# Patient Record
Sex: Male | Born: 1950
Health system: Southern US, Community
[De-identification: ages and names within clinical notes are randomized; demographics above are authoritative.]

## PROBLEM LIST (undated history)

## (undated) DIAGNOSIS — Z720 Tobacco use: Secondary | ICD-10-CM

## (undated) DIAGNOSIS — M799 Soft tissue disorder, unspecified: Secondary | ICD-10-CM

## (undated) DIAGNOSIS — R200 Anesthesia of skin: Secondary | ICD-10-CM

## (undated) DIAGNOSIS — E785 Hyperlipidemia, unspecified: Secondary | ICD-10-CM

## (undated) DIAGNOSIS — K219 Gastro-esophageal reflux disease without esophagitis: Secondary | ICD-10-CM

## (undated) DIAGNOSIS — N289 Disorder of kidney and ureter, unspecified: Secondary | ICD-10-CM

## (undated) DIAGNOSIS — N189 Chronic kidney disease, unspecified: Secondary | ICD-10-CM

## (undated) DIAGNOSIS — M79605 Pain in left leg: Secondary | ICD-10-CM

## (undated) DIAGNOSIS — I517 Cardiomegaly: Secondary | ICD-10-CM

## (undated) DIAGNOSIS — M5030 Other cervical disc degeneration, unspecified cervical region: Secondary | ICD-10-CM

## (undated) DIAGNOSIS — S39012A Strain of muscle, fascia and tendon of lower back, initial encounter: Secondary | ICD-10-CM

## (undated) DIAGNOSIS — I10 Essential (primary) hypertension: Secondary | ICD-10-CM

## (undated) HISTORY — PX: WISDOM TOOTH EXTRACTION: SHX21

## (undated) HISTORY — DX: Gastro-esophageal reflux disease without esophagitis: K21.9

## (undated) HISTORY — DX: Hyperlipidemia, unspecified: E78.5

## (undated) HISTORY — DX: Tobacco use: Z72.0

## (undated) HISTORY — PX: COLONOSCOPY: SHX174

## (undated) HISTORY — DX: Chronic kidney disease, unspecified: N18.9

## (undated) HISTORY — DX: Essential (primary) hypertension: I10

## (undated) HISTORY — DX: Cardiomegaly: I51.7

## (undated) HISTORY — DX: Pain in left leg: M79.605

## (undated) HISTORY — DX: Soft tissue disorder, unspecified: M79.9

## (undated) HISTORY — DX: Disorder of kidney and ureter, unspecified: N28.9

## (undated) HISTORY — DX: Anesthesia of skin: R20.0

## (undated) HISTORY — DX: Strain of muscle, fascia and tendon of lower back, initial encounter: S39.012A

---

## 2006-03-16 ENCOUNTER — Inpatient Hospital Stay (HOSPITAL_COMMUNITY): Admission: EM | Admit: 2006-03-16 | Discharge: 2006-03-19 | Payer: Self-pay | Admitting: Emergency Medicine

## 2006-03-16 ENCOUNTER — Ambulatory Visit: Payer: Self-pay | Admitting: Cardiology

## 2006-03-16 ENCOUNTER — Ambulatory Visit: Payer: Self-pay | Admitting: *Deleted

## 2006-03-18 ENCOUNTER — Encounter: Payer: Self-pay | Admitting: Internal Medicine

## 2006-03-21 DIAGNOSIS — N189 Chronic kidney disease, unspecified: Secondary | ICD-10-CM

## 2006-03-21 DIAGNOSIS — N183 Chronic kidney disease, stage 3 unspecified: Secondary | ICD-10-CM | POA: Insufficient documentation

## 2006-03-21 DIAGNOSIS — N184 Chronic kidney disease, stage 4 (severe): Secondary | ICD-10-CM | POA: Insufficient documentation

## 2006-03-21 HISTORY — DX: Chronic kidney disease, unspecified: N18.9

## 2006-03-22 ENCOUNTER — Encounter (INDEPENDENT_AMBULATORY_CARE_PROVIDER_SITE_OTHER): Payer: Self-pay | Admitting: Internal Medicine

## 2006-03-22 ENCOUNTER — Ambulatory Visit: Payer: Self-pay | Admitting: Hospitalist

## 2006-03-22 DIAGNOSIS — K219 Gastro-esophageal reflux disease without esophagitis: Secondary | ICD-10-CM

## 2006-03-22 DIAGNOSIS — Z720 Tobacco use: Secondary | ICD-10-CM

## 2006-03-22 DIAGNOSIS — I10 Essential (primary) hypertension: Secondary | ICD-10-CM | POA: Insufficient documentation

## 2006-03-22 DIAGNOSIS — E785 Hyperlipidemia, unspecified: Secondary | ICD-10-CM | POA: Insufficient documentation

## 2006-03-22 DIAGNOSIS — Z87891 Personal history of nicotine dependence: Secondary | ICD-10-CM | POA: Insufficient documentation

## 2006-03-22 DIAGNOSIS — I517 Cardiomegaly: Secondary | ICD-10-CM

## 2006-03-22 HISTORY — DX: Gastro-esophageal reflux disease without esophagitis: K21.9

## 2006-03-22 HISTORY — DX: Essential (primary) hypertension: I10

## 2006-03-22 HISTORY — DX: Tobacco use: Z72.0

## 2006-03-22 HISTORY — DX: Cardiomegaly: I51.7

## 2006-03-22 LAB — CONVERTED CEMR LAB
Amphetamine Screen, Ur: NEGATIVE
Barbiturate Quant, Ur: NEGATIVE
Benzodiazepines.: NEGATIVE
Cocaine Metabolites: NEGATIVE
Creatinine,U: 120.5 mg/dL
Marijuana Metabolite: NEGATIVE
Methadone: NEGATIVE
Opiates: NEGATIVE
Phencyclidine (PCP): NEGATIVE
Propoxyphene: NEGATIVE

## 2006-03-23 LAB — CONVERTED CEMR LAB
BUN: 36 mg/dL — ABNORMAL HIGH (ref 6–23)
CO2: 29 meq/L (ref 19–32)
Calcium: 9.6 mg/dL (ref 8.4–10.5)
Chloride: 99 meq/L (ref 96–112)
Creatinine, Ser: 2.09 mg/dL — ABNORMAL HIGH (ref 0.40–1.50)
Glucose, Bld: 85 mg/dL (ref 70–99)
HCT: 38.6 % — ABNORMAL LOW (ref 39.0–52.0)
Hemoglobin: 12.8 g/dL — ABNORMAL LOW (ref 13.0–17.0)
MCHC: 33.1 g/dL (ref 30.0–36.0)
MCV: 88.6 fL (ref 78.0–100.0)
Platelets: 269 10*3/uL (ref 150–400)
Potassium: 4.1 meq/L (ref 3.5–5.3)
RBC: 4.35 M/uL (ref 4.22–5.81)
RDW: 13.4 % (ref 11.5–14.0)
Sodium: 136 meq/L (ref 135–145)
WBC: 7 10*3/uL (ref 4.0–10.5)

## 2006-04-10 ENCOUNTER — Encounter (INDEPENDENT_AMBULATORY_CARE_PROVIDER_SITE_OTHER): Payer: Self-pay | Admitting: Internal Medicine

## 2006-04-10 ENCOUNTER — Ambulatory Visit: Payer: Self-pay | Admitting: Internal Medicine

## 2006-04-10 LAB — CONVERTED CEMR LAB
BUN: 25 mg/dL — ABNORMAL HIGH (ref 6–23)
CO2: 22 meq/L (ref 19–32)
Calcium: 9.4 mg/dL (ref 8.4–10.5)
Chloride: 102 meq/L (ref 96–112)
Creatinine, Ser: 1.82 mg/dL — ABNORMAL HIGH (ref 0.40–1.50)
Ferritin: 375 ng/mL — ABNORMAL HIGH (ref 22–322)
Glucose, Bld: 87 mg/dL (ref 70–99)
HCT: 35.2 % — ABNORMAL LOW (ref 39.0–52.0)
Hemoglobin: 12 g/dL — ABNORMAL LOW (ref 13.0–17.0)
Iron: 43 ug/dL (ref 42–165)
MCHC: 34.1 g/dL (ref 30.0–36.0)
MCV: 88.6 fL (ref 78.0–100.0)
Platelets: 271 10*3/uL (ref 150–400)
Potassium: 4.4 meq/L (ref 3.5–5.3)
RBC Folate: 483 ng/mL (ref 180–600)
RBC: 3.97 M/uL — ABNORMAL LOW (ref 4.22–5.81)
RDW: 13.4 % (ref 11.5–14.0)
Saturation Ratios: 15 % — ABNORMAL LOW (ref 20–55)
Sodium: 139 meq/L (ref 135–145)
TIBC: 296 ug/dL (ref 215–435)
UIBC: 253 ug/dL
Vitamin B-12: 335 pg/mL (ref 211–911)
WBC: 8.4 10*3/uL (ref 4.0–10.5)

## 2006-04-11 ENCOUNTER — Telehealth (INDEPENDENT_AMBULATORY_CARE_PROVIDER_SITE_OTHER): Payer: Self-pay | Admitting: *Deleted

## 2006-04-24 ENCOUNTER — Ambulatory Visit: Payer: Self-pay | Admitting: Internal Medicine

## 2006-04-30 ENCOUNTER — Ambulatory Visit: Payer: Self-pay | Admitting: Cardiology

## 2006-05-03 ENCOUNTER — Ambulatory Visit: Payer: Self-pay | Admitting: Internal Medicine

## 2006-05-10 ENCOUNTER — Telehealth (INDEPENDENT_AMBULATORY_CARE_PROVIDER_SITE_OTHER): Payer: Self-pay | Admitting: Internal Medicine

## 2006-05-10 ENCOUNTER — Ambulatory Visit: Payer: Self-pay | Admitting: Hospitalist

## 2006-05-10 ENCOUNTER — Encounter (INDEPENDENT_AMBULATORY_CARE_PROVIDER_SITE_OTHER): Payer: Self-pay | Admitting: Ophthalmology

## 2006-05-10 LAB — CONVERTED CEMR LAB
BUN: 21 mg/dL (ref 6–23)
CO2: 26 meq/L (ref 19–32)
Calcium: 9.2 mg/dL (ref 8.4–10.5)
Chloride: 101 meq/L (ref 96–112)
Creatinine, Ser: 1.82 mg/dL — ABNORMAL HIGH (ref 0.40–1.50)
Glucose, Bld: 153 mg/dL — ABNORMAL HIGH (ref 70–99)
Potassium: 3.3 meq/L — ABNORMAL LOW (ref 3.5–5.3)
Sodium: 136 meq/L (ref 135–145)

## 2006-06-18 ENCOUNTER — Ambulatory Visit: Payer: Self-pay | Admitting: Internal Medicine

## 2006-06-21 ENCOUNTER — Encounter (INDEPENDENT_AMBULATORY_CARE_PROVIDER_SITE_OTHER): Payer: Self-pay | Admitting: Ophthalmology

## 2006-06-21 ENCOUNTER — Ambulatory Visit: Payer: Self-pay | Admitting: Internal Medicine

## 2006-07-11 LAB — CONVERTED CEMR LAB
Aldosterone, Serum: 12
BUN: 24 mg/dL — ABNORMAL HIGH (ref 6–23)
CO2: 26 meq/L (ref 19–32)
Calcium: 9.5 mg/dL (ref 8.4–10.5)
Chloride: 107 meq/L (ref 96–112)
Creatinine, Ser: 1.8 mg/dL — ABNORMAL HIGH (ref 0.40–1.50)
Glucose, Bld: 170 mg/dL — ABNORMAL HIGH (ref 70–99)
PRA: 2.8
Potassium: 3.8 meq/L (ref 3.5–5.3)
Sodium: 139 meq/L (ref 135–145)

## 2006-07-19 ENCOUNTER — Telehealth (INDEPENDENT_AMBULATORY_CARE_PROVIDER_SITE_OTHER): Payer: Self-pay | Admitting: *Deleted

## 2006-08-02 ENCOUNTER — Ambulatory Visit: Payer: Self-pay | Admitting: Internal Medicine

## 2006-09-20 ENCOUNTER — Telehealth: Payer: Self-pay | Admitting: *Deleted

## 2006-10-04 ENCOUNTER — Telehealth (INDEPENDENT_AMBULATORY_CARE_PROVIDER_SITE_OTHER): Payer: Self-pay | Admitting: *Deleted

## 2006-10-28 ENCOUNTER — Telehealth: Payer: Self-pay | Admitting: *Deleted

## 2007-01-16 ENCOUNTER — Telehealth: Payer: Self-pay | Admitting: *Deleted

## 2007-01-29 ENCOUNTER — Telehealth (INDEPENDENT_AMBULATORY_CARE_PROVIDER_SITE_OTHER): Payer: Self-pay | Admitting: Internal Medicine

## 2007-03-05 ENCOUNTER — Telehealth: Payer: Self-pay | Admitting: *Deleted

## 2007-03-10 ENCOUNTER — Telehealth: Payer: Self-pay | Admitting: *Deleted

## 2007-03-11 ENCOUNTER — Telehealth (INDEPENDENT_AMBULATORY_CARE_PROVIDER_SITE_OTHER): Payer: Self-pay | Admitting: Internal Medicine

## 2007-03-28 ENCOUNTER — Encounter (INDEPENDENT_AMBULATORY_CARE_PROVIDER_SITE_OTHER): Payer: Self-pay | Admitting: Internal Medicine

## 2007-03-28 ENCOUNTER — Ambulatory Visit: Payer: Self-pay | Admitting: Internal Medicine

## 2007-03-29 LAB — CONVERTED CEMR LAB
OCCULT 1: NEGATIVE
OCCULT 1: NEGATIVE
OCCULT 2: NEGATIVE
OCCULT 2: NEGATIVE
OCCULT 3: NEGATIVE
OCCULT 3: NEGATIVE

## 2007-03-31 ENCOUNTER — Ambulatory Visit: Payer: Self-pay | Admitting: Internal Medicine

## 2007-04-03 LAB — CONVERTED CEMR LAB
ALT: 12 units/L (ref 0–53)
AST: 19 units/L (ref 0–37)
Albumin: 4.4 g/dL (ref 3.5–5.2)
Alkaline Phosphatase: 59 units/L (ref 39–117)
BUN: 23 mg/dL (ref 6–23)
Basophils Absolute: 0.1 10*3/uL (ref 0.0–0.1)
Basophils Relative: 1 % (ref 0–1)
CO2: 23 meq/L (ref 19–32)
Calcium: 9.5 mg/dL (ref 8.4–10.5)
Chloride: 100 meq/L (ref 96–112)
Creatinine, Ser: 1.88 mg/dL — ABNORMAL HIGH (ref 0.40–1.50)
Eosinophils Absolute: 0.2 10*3/uL (ref 0.0–0.7)
Eosinophils Relative: 3 % (ref 0–5)
Glucose, Bld: 211 mg/dL — ABNORMAL HIGH (ref 70–99)
HCT: 42.7 % (ref 39.0–52.0)
Hemoglobin: 14.2 g/dL (ref 13.0–17.0)
Lymphocytes Relative: 26 % (ref 12–46)
Lymphs Abs: 1.9 10*3/uL (ref 0.7–4.0)
MCHC: 33.3 g/dL (ref 30.0–36.0)
MCV: 89.7 fL (ref 78.0–100.0)
Monocytes Absolute: 0.5 10*3/uL (ref 0.1–1.0)
Monocytes Relative: 7 % (ref 3–12)
Neutro Abs: 4.7 10*3/uL (ref 1.7–7.7)
Neutrophils Relative %: 64 % (ref 43–77)
Platelets: 270 10*3/uL (ref 150–400)
Potassium: 4.2 meq/L (ref 3.5–5.3)
RBC: 4.76 M/uL (ref 4.22–5.81)
RDW: 13.4 % (ref 11.5–15.5)
Sodium: 140 meq/L (ref 135–145)
TSH: 0.407 microintl units/mL (ref 0.350–5.50)
Total Bilirubin: 0.4 mg/dL (ref 0.3–1.2)
Total Protein: 7.7 g/dL (ref 6.0–8.3)
WBC: 7.4 10*3/uL (ref 4.0–10.5)

## 2007-04-11 ENCOUNTER — Encounter (INDEPENDENT_AMBULATORY_CARE_PROVIDER_SITE_OTHER): Payer: Self-pay | Admitting: *Deleted

## 2007-04-11 ENCOUNTER — Ambulatory Visit: Payer: Self-pay | Admitting: Internal Medicine

## 2007-04-11 LAB — CONVERTED CEMR LAB
BUN: 24 mg/dL — ABNORMAL HIGH (ref 6–23)
CO2: 23 meq/L (ref 19–32)
Calcium: 10.3 mg/dL (ref 8.4–10.5)
Chloride: 103 meq/L (ref 96–112)
Cholesterol: 194 mg/dL (ref 0–200)
Creatinine, Ser: 1.78 mg/dL — ABNORMAL HIGH (ref 0.40–1.50)
Glucose, Bld: 138 mg/dL — ABNORMAL HIGH (ref 70–99)
HDL: 45 mg/dL (ref >39)
LDL Cholesterol: 116 mg/dL — ABNORMAL HIGH (ref 0–99)
Potassium: 4.2 meq/L (ref 3.5–5.3)
Sodium: 141 meq/L (ref 135–145)
Total CHOL/HDL Ratio: 4.3
Triglycerides: 166 mg/dL — ABNORMAL HIGH (ref <150)
VLDL: 33 mg/dL (ref 0–40)

## 2007-05-08 ENCOUNTER — Ambulatory Visit: Payer: Self-pay | Admitting: Infectious Diseases

## 2007-08-04 ENCOUNTER — Telehealth (INDEPENDENT_AMBULATORY_CARE_PROVIDER_SITE_OTHER): Payer: Self-pay | Admitting: Internal Medicine

## 2007-10-21 ENCOUNTER — Telehealth (INDEPENDENT_AMBULATORY_CARE_PROVIDER_SITE_OTHER): Payer: Self-pay | Admitting: Internal Medicine

## 2007-12-17 ENCOUNTER — Ambulatory Visit: Payer: Self-pay | Admitting: Internal Medicine

## 2008-01-02 ENCOUNTER — Ambulatory Visit: Payer: Self-pay | Admitting: Internal Medicine

## 2008-01-19 LAB — CONVERTED CEMR LAB
ALT: 15 units/L (ref 0–53)
AST: 17 units/L (ref 0–37)
Albumin: 4.7 g/dL (ref 3.5–5.2)
Alkaline Phosphatase: 55 units/L (ref 39–117)
BUN: 28 mg/dL — ABNORMAL HIGH (ref 6–23)
Basophils Absolute: 0 10*3/uL (ref 0.0–0.1)
Basophils Relative: 1 % (ref 0–1)
CO2: 25 meq/L (ref 19–32)
Calcium: 9.8 mg/dL (ref 8.4–10.5)
Chloride: 102 meq/L (ref 96–112)
Cholesterol: 184 mg/dL (ref 0–200)
Creatinine, Ser: 1.83 mg/dL — ABNORMAL HIGH (ref 0.40–1.50)
Eosinophils Absolute: 0.2 10*3/uL (ref 0.0–0.7)
Eosinophils Relative: 3 % (ref 0–5)
Glucose, Bld: 153 mg/dL — ABNORMAL HIGH (ref 70–99)
HCT: 42.1 % (ref 39.0–52.0)
HDL: 43 mg/dL (ref >39)
Hemoglobin: 14 g/dL (ref 13.0–17.0)
LDL Cholesterol: 111 mg/dL — ABNORMAL HIGH (ref 0–99)
Lymphocytes Relative: 27 % (ref 12–46)
Lymphs Abs: 1.8 10*3/uL (ref 0.7–4.0)
MCHC: 33.3 g/dL (ref 30.0–36.0)
MCV: 89.4 fL (ref 78.0–100.0)
Monocytes Absolute: 0.5 10*3/uL (ref 0.1–1.0)
Monocytes Relative: 7 % (ref 3–12)
Neutro Abs: 4.3 10*3/uL (ref 1.7–7.7)
Neutrophils Relative %: 63 % (ref 43–77)
Platelets: 288 10*3/uL (ref 150–400)
Potassium: 4.2 meq/L (ref 3.5–5.3)
RBC: 4.71 M/uL (ref 4.22–5.81)
RDW: 13.5 % (ref 11.5–15.5)
Sodium: 140 meq/L (ref 135–145)
TSH: 0.64 microintl units/mL (ref 0.350–4.50)
Total Bilirubin: 0.5 mg/dL (ref 0.3–1.2)
Total CHOL/HDL Ratio: 4.3
Total Protein: 7.9 g/dL (ref 6.0–8.3)
Triglycerides: 152 mg/dL — ABNORMAL HIGH (ref <150)
VLDL: 30 mg/dL (ref 0–40)
WBC: 6.8 10*3/uL (ref 4.0–10.5)

## 2008-03-22 DIAGNOSIS — E785 Hyperlipidemia, unspecified: Secondary | ICD-10-CM

## 2008-03-22 HISTORY — DX: Hyperlipidemia, unspecified: E78.5

## 2008-07-12 ENCOUNTER — Telehealth: Payer: Self-pay | Admitting: *Deleted

## 2008-07-15 ENCOUNTER — Ambulatory Visit: Payer: Self-pay | Admitting: Internal Medicine

## 2008-07-15 DIAGNOSIS — S39012A Strain of muscle, fascia and tendon of lower back, initial encounter: Secondary | ICD-10-CM

## 2008-07-15 DIAGNOSIS — S335XXA Sprain of ligaments of lumbar spine, initial encounter: Secondary | ICD-10-CM | POA: Insufficient documentation

## 2008-07-15 HISTORY — DX: Strain of muscle, fascia and tendon of lower back, initial encounter: S39.012A

## 2008-07-16 LAB — CONVERTED CEMR LAB
BUN: 26 mg/dL — ABNORMAL HIGH (ref 6–23)
CO2: 22 meq/L (ref 19–32)
Calcium: 9.6 mg/dL (ref 8.4–10.5)
Chloride: 105 meq/L (ref 96–112)
Cholesterol: 221 mg/dL — ABNORMAL HIGH (ref 0–200)
Creatinine, Ser: 1.84 mg/dL — ABNORMAL HIGH (ref 0.40–1.50)
GFR calc Af Amer: 46 mL/min — ABNORMAL LOW (ref >60)
GFR calc non Af Amer: 38 mL/min — ABNORMAL LOW (ref >60)
Glucose, Bld: 148 mg/dL — ABNORMAL HIGH (ref 70–99)
HDL: 42 mg/dL (ref >39)
LDL Cholesterol: 151 mg/dL — ABNORMAL HIGH (ref 0–99)
Potassium: 4.2 meq/L (ref 3.5–5.3)
Sodium: 138 meq/L (ref 135–145)
Total CHOL/HDL Ratio: 5.3
Triglycerides: 141 mg/dL (ref <150)
VLDL: 28 mg/dL (ref 0–40)

## 2008-08-03 DIAGNOSIS — M799 Soft tissue disorder, unspecified: Secondary | ICD-10-CM

## 2008-08-03 HISTORY — DX: Soft tissue disorder, unspecified: M79.9

## 2008-12-10 ENCOUNTER — Telehealth: Payer: Self-pay | Admitting: Internal Medicine

## 2008-12-15 ENCOUNTER — Ambulatory Visit: Payer: Self-pay | Admitting: Internal Medicine

## 2008-12-15 LAB — CONVERTED CEMR LAB
BUN: 21 mg/dL (ref 6–23)
CO2: 21 meq/L (ref 19–32)
Calcium: 9.8 mg/dL (ref 8.4–10.5)
Chloride: 106 meq/L (ref 96–112)
Creatinine, Ser: 1.55 mg/dL — ABNORMAL HIGH (ref 0.40–1.50)
Glucose, Bld: 84 mg/dL (ref 70–99)
LDL Goal: 100 mg/dL
Potassium: 3.9 meq/L (ref 3.5–5.3)
Sodium: 140 meq/L (ref 135–145)

## 2008-12-30 ENCOUNTER — Encounter (INDEPENDENT_AMBULATORY_CARE_PROVIDER_SITE_OTHER): Payer: Self-pay | Admitting: Internal Medicine

## 2009-01-14 ENCOUNTER — Ambulatory Visit: Payer: Self-pay | Admitting: Infectious Disease

## 2009-04-08 ENCOUNTER — Telehealth: Payer: Self-pay | Admitting: Internal Medicine

## 2009-04-28 ENCOUNTER — Ambulatory Visit: Payer: Self-pay | Admitting: Internal Medicine

## 2009-04-28 LAB — CONVERTED CEMR LAB
ALT: 16 units/L (ref 0–53)
AST: 23 units/L (ref 0–37)
Albumin: 4.3 g/dL (ref 3.5–5.2)
Alkaline Phosphatase: 77 units/L (ref 39–117)
BUN: 30 mg/dL — ABNORMAL HIGH (ref 6–23)
CO2: 24 meq/L (ref 19–32)
Calcium: 9.5 mg/dL (ref 8.4–10.5)
Chloride: 106 meq/L (ref 96–112)
Cholesterol: 148 mg/dL (ref 0–200)
Creatinine, Ser: 1.63 mg/dL — ABNORMAL HIGH (ref 0.40–1.50)
Glucose, Bld: 101 mg/dL — ABNORMAL HIGH (ref 70–99)
HDL: 41 mg/dL (ref >39)
LDL Cholesterol: 79 mg/dL (ref 0–99)
Potassium: 4 meq/L (ref 3.5–5.3)
Sodium: 140 meq/L (ref 135–145)
Total Bilirubin: 0.5 mg/dL (ref 0.3–1.2)
Total CHOL/HDL Ratio: 3.6
Total Protein: 7.4 g/dL (ref 6.0–8.3)
Triglycerides: 141 mg/dL (ref <150)
VLDL: 28 mg/dL (ref 0–40)

## 2009-10-07 ENCOUNTER — Ambulatory Visit: Payer: Self-pay | Admitting: Internal Medicine

## 2009-10-07 DIAGNOSIS — R209 Unspecified disturbances of skin sensation: Secondary | ICD-10-CM | POA: Insufficient documentation

## 2009-10-07 DIAGNOSIS — R200 Anesthesia of skin: Secondary | ICD-10-CM

## 2009-10-07 HISTORY — DX: Anesthesia of skin: R20.0

## 2009-10-21 ENCOUNTER — Ambulatory Visit: Payer: Self-pay | Admitting: Internal Medicine

## 2009-10-21 LAB — CONVERTED CEMR LAB
BUN: 29 mg/dL — ABNORMAL HIGH (ref 6–23)
CO2: 22 meq/L (ref 19–32)
Calcium: 9.9 mg/dL (ref 8.4–10.5)
Chloride: 106 meq/L (ref 96–112)
Creatinine, Ser: 1.6 mg/dL — ABNORMAL HIGH (ref 0.40–1.50)
Glucose, Bld: 108 mg/dL — ABNORMAL HIGH (ref 70–99)
Potassium: 4.3 meq/L (ref 3.5–5.3)
Sodium: 141 meq/L (ref 135–145)

## 2009-11-15 ENCOUNTER — Encounter: Payer: Self-pay | Admitting: Internal Medicine

## 2009-11-15 ENCOUNTER — Ambulatory Visit: Payer: Self-pay | Admitting: Internal Medicine

## 2009-11-15 DIAGNOSIS — M79609 Pain in unspecified limb: Secondary | ICD-10-CM | POA: Insufficient documentation

## 2009-11-15 DIAGNOSIS — M79605 Pain in left leg: Secondary | ICD-10-CM

## 2009-11-15 HISTORY — DX: Pain in left leg: M79.605

## 2009-11-15 LAB — CONVERTED CEMR LAB
ALT: 17 units/L (ref 0–53)
AST: 20 units/L (ref 0–37)
Albumin: 3.8 g/dL (ref 3.5–5.2)
Alkaline Phosphatase: 61 units/L (ref 39–117)
BUN: 22 mg/dL (ref 6–23)
Basophils Absolute: 0.1 10*3/uL (ref 0.0–0.1)
Basophils Relative: 1 % (ref 0–1)
CO2: 28 meq/L (ref 19–32)
Calcium: 9.3 mg/dL (ref 8.4–10.5)
Chloride: 103 meq/L (ref 96–112)
Creatinine, Ser: 1.69 mg/dL — ABNORMAL HIGH (ref 0.40–1.50)
Eosinophils Absolute: 0.2 10*3/uL (ref 0.0–0.7)
Eosinophils Relative: 2 % (ref 0–5)
Glucose, Bld: 112 mg/dL — ABNORMAL HIGH (ref 70–99)
HCT: 36.1 % — ABNORMAL LOW (ref 39.0–52.0)
Hemoglobin: 12.2 g/dL — ABNORMAL LOW (ref 13.0–17.0)
Lymphocytes Relative: 32 % (ref 12–46)
Lymphs Abs: 2.7 10*3/uL (ref 0.7–4.0)
MCHC: 33.8 g/dL (ref 30.0–36.0)
MCV: 87.8 fL (ref >=78.0)
Monocytes Absolute: 0.6 10*3/uL (ref 0.1–1.0)
Monocytes Relative: 8 % (ref 3–12)
Neutro Abs: 4.7 10*3/uL (ref 1.7–7.7)
Neutrophils Relative %: 57 % (ref 43–77)
Platelets: 319 10*3/uL (ref 150–400)
Potassium: 4.1 meq/L (ref 3.5–5.3)
RBC: 4.11 M/uL — ABNORMAL LOW (ref 4.22–5.81)
RDW: 13.6 % (ref 11.5–15.5)
Sed Rate: 8 mm/hr (ref 0–16)
Sodium: 138 meq/L (ref 135–145)
Total Bilirubin: 0.5 mg/dL (ref 0.3–1.2)
Total Protein: 7.3 g/dL (ref 6.0–8.3)
WBC: 8.3 10*3/uL (ref 4.0–10.5)

## 2010-01-16 ENCOUNTER — Telehealth: Payer: Self-pay | Admitting: *Deleted

## 2010-04-04 NOTE — Assessment & Plan Note (Signed)
Summary: [name]   Vital Signs:  Patient profile:   60 year old male Height:      71 inches (180.34 cm) Weight:      156.0 pounds (71.23 kg) BMI:     21.93 Temp:     97.5 degrees F (36.39 degrees C) oral Pulse rate:   61 / minute BP sitting:   132 / 79  (left arm) Cuff size:   regular  Vitals Entered By: Lucky Rathke NT II (October 21, 2009 9:48 AM) CC: FOLLOW UP VISIT ON BP  NO OTHER CONCERNS Is Patient Diabetic? No Pain Assessment Patient in pain? no      Nutritional Status BMI of 19 -24 = normal  Have you ever been in a relationship where you felt threatened, hurt or afraid?No   Does patient need assistance? Functional Status Self care Ambulation Normal Comments FOLLOW UP APPT ON BP / NO OTHER CONCERNS   CC:  FOLLOW UP VISIT ON BP  NO OTHER CONCERNS.  History of Present Illness: William Perkins is a 60 y/o man with pmh significant for HLD, HTN and GERD presents to [facility] for 2 week followup of his BP, today it is at goal, patient tolerating new meds well and has no new complaints.   Patient is feeling well and denies CP, abdominal pain, nausea, vomiting, HA's, palpitations, blurred vision. fever, chills, diarrhea, constipation or SOB.   Preventive Screening-Counseling & Management  Alcohol-Tobacco     Alcohol drinks/day: 0     Smoking Status: current     Smoking Cessation Counseling: yes     Packs/Day: 1     Year Started: 1967  Caffeine-Diet-Exercise     Does Patient Exercise: yes     Type of exercise: WALKING  Current Medications (verified): 1)  Lisinopril 40 Mg Tabs (Lisinopril) .... Take 1  Tablet By Mouth Once A Day 2)  Pravachol 40 Mg Tabs (Pravastatin Sodium) .... Take 1 Tablet By Mouth Once A Day 3)  Omeprazole 40 Mg Cpdr (Omeprazole) .... Take 1 Tablet By Mouth Once A Day 4)  Norvasc 10 Mg Tabs (Amlodipine Besylate) .... Take 1 Tablet By Mouth Once A Day 5)  Furosemide 40 Mg  Tabs (Furosemide) .... Take 1 Tablet By Mouth Once A Day 6)   Catapres 0.1 Mg Tabs (Clonidine Hcl) .... Take 1 Tablet By Mouth Two Times A Day 7)  Hydralazine Hcl 10 Mg  Tabs (Hydralazine Hcl) .... Take 2 Tablet By Mouth Two Times A Day 8)  Aspir-Low 81 Mg Tbec (Aspirin) .... Take 1 Tablet By Mouth Once A Day 9)  Metamucil 30.9 % Powd (Psyllium) .... Take One Tablespoon and Mix With Water 2-3 Times A Day For Constipation.  Allergies (verified): No Known Drug Allergies  Review of Systems       As Per HPI  Physical Exam  General:  alert and well-developed.   Lungs:  normal respiratory effort, normal breath sounds, and no crackles.   Heart:  normal rate, regular rhythm, no murmur, no gallop, no rub, and no JVD.   Msk:  normal ROM.   Extremities:  no LE edema noted  Neurologic:  alert & oriented X3, non focal.    Impression & Recommendations:  Problem # 1:  HYPERTENSION (ICD-401.9) Assessment Improved Well controlled on current treatment, No new changes made today, Will continue to monitor.  will get BMET given recent initiation of BP meds.   His updated medication list for this problem includes:  Lisinopril 40 Mg Tabs (Lisinopril) .Marland Kitchen... Take 1  tablet by mouth once a day    Norvasc 10 Mg Tabs (Amlodipine besylate) .Marland Kitchen... Take 1 tablet by mouth once a day    Furosemide 40 Mg Tabs (Furosemide) .Marland Kitchen... Take 1 tablet by mouth once a day    Catapres 0.1 Mg Tabs (Clonidine hcl) .Marland Kitchen... Take 1 tablet by mouth two times a day    Hydralazine Hcl 10 Mg Tabs (Hydralazine hcl) .Marland Kitchen... Take 2 tablet by mouth two times a day  Orders: T-Basic Metabolic Panel (99991111)  Problem # 2:  RENAL INSUFFICIENCY, CHRONIC (ICD-585.9) Assessment: Comment Only will check renal function today.   Problem # 3:  HYPERLIPIDEMIA (P102836.4) Assessment: Comment Only pt tolerating pravastatin well, will get LFT in 3 months.   His updated medication list for this problem includes:    Pravachol 40 Mg Tabs (Pravastatin sodium) .Marland Kitchen... Take 1 tablet by mouth once a  day  Problem # 4:  TOBACCO ABUSE (ICD-305.1) Assessment: Comment Only Patient was counseled on smoking cessation strategies including medications and behavior modification options. Patient said she was not ready to stop smoking at this time.    Complete Medication List: 1)  Lisinopril 40 Mg Tabs (Lisinopril) .... Take 1  tablet by mouth once a day 2)  Pravachol 40 Mg Tabs (Pravastatin sodium) .... Take 1 tablet by mouth once a day 3)  Omeprazole 40 Mg Cpdr (Omeprazole) .... Take 1 tablet by mouth once a day 4)  Norvasc 10 Mg Tabs (Amlodipine besylate) .... Take 1 tablet by mouth once a day 5)  Furosemide 40 Mg Tabs (Furosemide) .... Take 1 tablet by mouth once a day 6)  Catapres 0.1 Mg Tabs (Clonidine hcl) .... Take 1 tablet by mouth two times a day 7)  Hydralazine Hcl 10 Mg Tabs (Hydralazine hcl) .... Take 2 tablet by mouth two times a day 8)  Aspir-low 81 Mg Tbec (Aspirin) .... Take 1 tablet by mouth once a day 9)  Metamucil 30.9 % Powd (Psyllium) .... Take one tablespoon and mix with water 2-3 times a day for constipation.  Patient Instructions: 1)  Please schedule a follow-up appointment in 3 months. Process Orders Check Orders Results:     [facility]: ABN not required for this insurance Tests Sent for requisitioning (October 21, 2009 11:28 AM):     10/21/2009: [facility] -- T-Basic Metabolic Panel 0000000 (signed)     Prevention & Chronic Care Immunizations   Influenza vaccine: Historical  (12/03/2008)   Influenza vaccine deferral: Refused  (12/15/2008)    Tetanus booster: Not documented   Td booster deferral: Deferred  (10/07/2009)    Pneumococcal vaccine: Not documented  Colorectal Screening   Hemoccult: Not documented   Hemoccult action/deferral: Ordered  (12/15/2008)    Colonoscopy: Not documented   Colonoscopy action/deferral: Refused  (04/28/2009)  Other Screening   PSA: Not documented   Smoking status: current   (10/21/2009)   Smoking cessation counseling: yes  (10/21/2009)  Lipids   Total Cholesterol: 148  (04/28/2009)   Lipid panel action/deferral: Lipid Panel ordered   LDL: 79  (04/28/2009)   LDL Direct: Not documented   HDL: 41  (04/28/2009)   Triglycerides: 141  (04/28/2009)    SGOT (AST): 23  (04/28/2009)   BMP action: Ordered   SGPT (ALT): 16  (04/28/2009)   Alkaline phosphatase: 77  (04/28/2009)   Total bilirubin: 0.5  (04/28/2009)    Lipid flowsheet reviewed?: Yes   Progress toward LDL  goal: At goal  Hypertension   Last Blood Pressure: 132 / 79  (10/21/2009)   Serum creatinine: 1.63  (04/28/2009)   BMP action: Ordered   Serum potassium 4.0  (04/28/2009)    Hypertension flowsheet reviewed?: Yes   Progress toward BP goal: At goal  Self-Management Support :   Personal Goals (by the next clinic visit) :      Personal blood pressure goal: 140/90  (04/28/2009)     Personal LDL goal: 100  (04/28/2009)    Patient will work on the following items until the next clinic visit to reach self-care goals:     Medications and monitoring: take my medicines every day, bring all of my medications to every visit  (10/21/2009)     Eating: drink diet soda or water instead of juice or soda, eat more vegetables, use fresh or frozen vegetables, eat foods that are low in salt, eat baked foods instead of fried foods, eat fruit for snacks and desserts, limit or avoid alcohol  (10/21/2009)     Activity: take a 30 minute walk every day  (10/21/2009)    Hypertension self-management support: Resources for patients handout, Written self-care plan  (10/21/2009)   Hypertension self-care plan printed.    Lipid self-management support: Resources for patients handout, Written self-care plan  (10/21/2009)   Lipid self-care plan printed.      Resource handout printed.

## 2010-04-04 NOTE — Assessment & Plan Note (Signed)
Summary: CHECKUP/SB.   Vital Signs:  Patient Profile:   59 Years Old Male Weight:      161.9 pounds (73.59 kg) Temp:     97.3 degrees F oral Pulse rate:   68 / minute BP sitting:   184 / 95  (right arm)  Pt. in pain?   no  Vitals Entered By: Tenna Delaine RN (April 10, 2006 2:30 PM)              Is Patient Diabetic? No Nutritional Status Normal  Have you ever been in a relationship where you felt threatened, hurt or afraid?No   Does patient need assistance? Functional Status Self care Ambulation Normal   Chief Complaint:  , f/u  check up, and med refills.  History of Present Illness: This is a 60 year old man who is known to me who is here for a BP check.  He has had no problems taking the new medications and no side effects.  He finally was able to have a BM after using Colace and feels better.  The dizziness has resolved as well.  He has no complaints.  ROS is negative for CP, SOB, abd pain, N/V/D/C, blood in BM or urine, muscle weakness or cramping or problems eating.  Has not been called with the cardiology appt yet, therefore has not seen the cardiologist yet.  Has decreased his smoking from 2 ppd to 1-2 cigs/day since starting the Chantix!  Prior Medications (reviewed today): LISINOPRIL 20 MG TABS (LISINOPRIL) Take 1 tablet by mouth once a day ZOCOR 40 MG TABS (SIMVASTATIN) Take 1 tablet by mouth once a day at bedtime COLACE 100 MG CAPS (DOCUSATE SODIUM) Take 1 tablet by mouth two times a day PROTONIX 40 MG TBEC (PANTOPRAZOLE SODIUM) Take 1 tablet by mouth once a day CHANTIX STARTING MONTH PAK 0.5 MG X 11 & 1 MG X 42 MISC (VARENICLINE TARTRATE) Take 0.5mg  daily days 1-3, then 0.5mg  twice a day days 4-7, the 1mg  once a day thereafter for 11 weeks Current Allergies: No known allergies      Review of Systems      See HPI   Physical Exam  Deferred, he was just examined 2 weeks ago.    Impression & Recommendations:  Problem # 1:  HYPERTENSION  (ICD-401.9) The pt's BP is better today, but not much.  I have increased his Norvasc to 10mg  daily and would like to see him back in 2-3 weeks for another BP check.  He was given a RX for the new dose of Norvasc.  There is a BMET pending because he is on the Lisinopril to check his Cr and K.  The pt will be called with an appointment for followup with [place] cards as per their note in the hospital chart for a possible Myoview. His updated medication list for this problem includes:    Lisinopril 20 Mg Tabs (Lisinopril) .Marland Kitchen... Take 1 tablet by mouth once a day    Norvasc 10 Mg Tabs (Amlodipine besylate) .Marland Kitchen... Take 1 tablet by mouth once a day  Orders: T-Basic Metabolic Panel (99991111)   Problem # 2:  ANEMIA NOS (ICD-285.9) The pt has an anemia that was found on his labs from the HFU 2 weeks ago.  I have ordered an anemia panel to further elucidate the nature of his anemia.  My ddx includes either a GI bleed (although it would be occult as he denies any melena or BRBPR) or decreased erythropoeitin 2/2 renal insufficiency.  He was given stool cards to send back in and will be referred for a screening colonoscopy because he's anemic, but also because he's age-appropriate.  A repeat CBC is pending from today as well to see if his hgb has changed at all. Orders: T-Ferritin [phone]) T-Iron Binding Capacity (TIBC) ([id number]) T-CBC No Diff [id number]) T-Vitamin B12 (99991111) T-Folic Acid; RBC (Q000111Q) Alric Quan [id number]) Gastroenterology Referral (GI)  Future Orders: T-Hemoccult Card-Multiple (take home) ([id number]) ... 04/17/2006   Problem # 3:  TOBACCO ABUSE (ICD-305.1) The pt was congratulated on his success in cutting down on his tobacco use.  He was encouraged to keep up the good work! His updated medication list for this problem includes:    Chantix Starting Month Pak 0.5 Mg X 11 & 1 Mg X 42 Misc (Varenicline tartrate) .Marland Kitchen... Take 0.5mg  daily days 1-3, then 0.5mg  twice a day  days 4-7, the 1mg  once a day thereafter for 11 weeks   Problem # 4:  RENAL INSUFFICIENCY, CHRONIC (ICD-585.9) The pt's Cr was stable from the hospital to the [facility] labs.  There is a BMET pending today because he's on Lisinopril to make sure his Cr is OK and to see if it's any better now that he's off the HCTZ. Orders: T-Basic Metabolic Panel ([id number])   Medications Added to Medication List This Visit: 1)  Norvasc 10 Mg Tabs (Amlodipine besylate) .... Take 1 tablet by mouth once a day   Patient Instructions: 1)  Please schedule a follow-up appointment in 3 weeks. 2)  Start taking two of the 5mg  Norvasc tablets a day (= 10mg /day) until you run out of the 5mg  tablets, then fill the new prescription for the Norvasc 10mg  tablets and take one a day. 3)  You will be called with your appointment with the cardiologist. 4)  You will be called with an appointment for the colonoscopy. 5)  Please mail the stool cards back to [place] when you have completed them.  You must have 3 bowel movements within 10 days for the test to be valid.  Appended Document: results hemoccult cards    Lab Visit  Laboratory Results   Other Tests  Date/Time Received: April 24, 2006 3:46 PM  Date/Time Reported: April 24, 2006 3:46 PM ..................................................................Marland KitchenMelvia Heaps  April 24, 2006 3:47 PM   Stool - Occult Blood Hemmoccult #1: negative Date: 04/12/2006 Hemoccult #2: negative Date: 04/14/2006 Hemoccult #3: negative Date: 04/20/2006  Kit Test Internal QC: Positive   (Normal Range: Negative)  Orders Today:

## 2010-04-04 NOTE — Progress Notes (Signed)
Summary: med refill/gp  page 2  Phone Note Refill Request Message from:  Patient on December 10, 2008 1:57 PM  Refills Requested: Medication #1:  FUROSEMIDE 40 MG  TABS Take 1 tablet by mouth once a day  Medication #2:  CATAPRES 0.1 MG TABS Take 1 tablet by mouth two times a day  Medication #3:  HYDRALAZINE HCL 10 MG  TABS Take 1 tablet by mouth two times a day  Medication #4:  NICOTROL 10 MG INHA Use as often as needed when you feel the urge to smoke.  Use at least 6 cartridges per day  Method Requested: Electronic Initial call taken by: Morrison Old RN,  December 10, 2008 1:57 PM  Follow-up for Phone Call        Refilled electronically.  Follow-up by: Bertha Stakes MD,  December 10, 2008 2:32 PM    Prescriptions: NICOTROL 10 MG INHA (NICOTINE) Use as often as needed when you feel the urge to smoke.  Use at least 6 cartridges per day, max of 16.  #1 kit x 2   Entered and Authorized by:   Bertha Stakes MD   Signed by:   Bertha Stakes MD on 12/10/2008   Method used:   Electronically to        [facility] (retail)       [address]       [place], [place]  [address]       Ph: [phone]       Fax: [phone]   RxID:   [id number] HYDRALAZINE HCL 10 MG  TABS (HYDRALAZINE HCL) Take 1 tablet by mouth two times a day  #60 x 2   Entered and Authorized by:   Bertha Stakes MD   Signed by:   Bertha Stakes MD on 12/10/2008   Method used:   Electronically to        [facility] (retail)       [address]       [place], [place]  [address]       Ph: [phone]       Fax: [phone]   RxID:   [id number] CATAPRES 0.1 MG TABS (CLONIDINE HCL) Take 1 tablet by mouth two times a day  #60 x 2   Entered and Authorized by:   Bertha Stakes MD   Signed by:   Bertha Stakes MD on 12/10/2008   Method used:   Electronically to        [facility] (retail)       [place], [place]  [address]       Ph: [phone]       Fax: [phone]   RxID:   [id number] FUROSEMIDE 40 MG  TABS (FUROSEMIDE) Take 1 tablet by mouth once a day  #30 x 2   Entered and Authorized by:   Bertha Stakes MD   Signed by:   Bertha Stakes MD on 12/10/2008   Method used:   Electronically to        [facility] (retail)       [address]       [place], [place]  [address]       Ph: [phone]  Fax: [phone]   [id number]

## 2010-04-04 NOTE — Progress Notes (Signed)
Summary: Refill  Phone Note Outgoing Call   Call placed by: Sander Nephew RN,  Jul 12, 2008 1:44 PM Call placed to: Patient Summary of Call: RTC to pt .  Pt called saying that he needed refills on a medicine until his appointment.  Did not leave the name of the medication thta he needs. Sander Nephew RN  Jul 12, 2008 1:45 PM  Initial call taken by: Sander Nephew RN,  Jul 12, 2008 1:45 PM

## 2010-04-04 NOTE — Assessment & Plan Note (Signed)
Summary: RECK BP/LABS/AGNEW/VS   Vital Signs:  Patient Profile:   60 Years Old Male Height:     71 inches (180.34 cm) Weight:      165.7 pounds BMI:     23.19 Temp:     97.5 degrees F oral Pulse rate:   52 / minute BP sitting:   164 / 83  (right arm)  Pt. in pain?   no  Vitals Entered By: Silverio Decamp (April 11, 2007 10:33 AM)              Is Patient Diabetic? No Nutritional Status BMI of 19 -24 = normal  Does patient need assistance? Functional Status Self care Ambulation Normal     Serial Vital Signs/Assessments:  Time      Position  BP       Pulse  Resp  Temp     By                     140/80   52                    Mamie Hague   Chief Complaint:  follow-up blood pressure.  History of Present Illness: Here for a BP check.  He checks his own BP once a week with a wrist cuff and he usually gets readings 140's to 130's.  He had elevated SBP when he first arrived but that came down to 140 with repeat check.  He took his medicines this am.  I rechecked in manually and I got 156/90.  Current Allergies: No known allergies     Risk Factors: Tobacco use:  current    Year started:  1967    Cigarettes:  Yes -- 1 pack(s) per day Drug use:  no Alcohol use:  no Exercise:  no Seatbelt use:  100 %    Physical Exam  General:     alert and well-developed.   Lungs:     normal respiratory effort and normal breath sounds.   Heart:     2/6 SEM best heard lower left sternal border    Impression & Recommendations:  Problem # 1:  HYPERTENSION (ICD-401.9) Will start him on hydralazine 10mg  two times a day.  He assures me that he is taking all his medications.  If we add hydralazine he won't have to come back so much for lab tests. I will also check a BMET today.  His updated medication list for this problem includes:    Lisinopril 40 Mg Tabs (Lisinopril) .Marland Kitchen... Take 2  tablets by mouth once a day    Norvasc 10 Mg Tabs (Amlodipine besylate) .Marland Kitchen... Take 1 tablet by  mouth once a day    Furosemide 40 Mg Tabs (Furosemide) .Marland Kitchen... Take 1 tablet by mouth once a day    Catapres 0.1 Mg Tabs (Clonidine hcl) .Marland Kitchen... Take 1 tablet by mouth two times a day    Hydralazine Hcl 10 Mg Tabs (Hydralazine hcl) .Marland Kitchen... Take 1 tablet by mouth two times a day  Orders: T-Basic Metabolic Panel (99991111)  BP today: 164/83 Prior BP: 162/82 (03/28/2007)  Labs Reviewed: Creat: 1.88 (03/28/2007)   Complete Medication List: 1)  Lisinopril 40 Mg Tabs (Lisinopril) .... Take 2  tablets by mouth once a day 2)  Zocor 40 Mg Tabs (Simvastatin) .... Take 1 tablet by mouth once a day at bedtime 3)  Colace 100 Mg Caps (Docusate sodium) .... Take 1 tablet by mouth at bedtime 4)  Protonix 40 Mg Tbec (Pantoprazole sodium) .... Take 1 tablet by mouth once a day 5)  Norvasc 10 Mg Tabs (Amlodipine besylate) .... Take 1 tablet by mouth once a day 6)  Furosemide 40 Mg Tabs (Furosemide) .... Take 1 tablet by mouth once a day 7)  Catapres 0.1 Mg Tabs (Clonidine hcl) .... Take 1 tablet by mouth two times a day 8)  Nicotine 21-14-7 Mg/24hr Kit (Nicotine) .... Place a 21mg  patch on the skin of your arm once a day, removing the old patch before placing a new one.  use each dose (21, 14 or 7) for 2 weeks, then stop. 9)  Viagra 50 Mg Tabs (Sildenafil citrate) .... Take 1/2 a tablet 0.5 to 4 hours before intercourse, if no results, take the other 1/2 for a maximum of 50mg /day.  max dose is 50mg /day. 10)  Hydralazine Hcl 10 Mg Tabs (Hydralazine hcl) .... Take 1 tablet by mouth two times a day   Patient Instructions: 1)  Please schedule a follow-up appointment in 1 month for BP check.   2)  Record your blood pressure measurements that you take at home and bring them with you next office visit.    Prescriptions: HYDRALAZINE HCL 10 MG  TABS (HYDRALAZINE HCL) Take 1 tablet by mouth two times a day  #60 x 5   Entered and Authorized by:   Erma Heritage MD   Signed by:   Erma Heritage MD on 04/11/2007    Method used:   Electronically sent to ...       [facility]       [address]       [place], [place]  [address]       Ph: [phone]       Fax: [phone]   RxID:   [id number]  ]

## 2010-04-04 NOTE — Progress Notes (Signed)
Summary: refill/ hla  Phone Note Refill Request Message from:  Patient on March 11, 2007 4:43 PM  Refills Requested: Medication #1:  CATAPRES 0.1 MG TABS Take 1 tablet by mouth two times a day   Last Refilled: 11/26 pt is planning on coming to his late jan appt  Initial call taken by: Freddy Finner RN,  March 11, 2007 4:44 PM  Follow-up for Phone Call        Rx completed electronically Follow-up by: Rico Sheehan DO,  March 11, 2007 4:49 PM      Prescriptions: CATAPRES 0.1 MG TABS (CLONIDINE HCL) Take 1 tablet by mouth two times a day  #60 x 0   Entered and Authorized by:   Rico Sheehan DO   Signed by:   Rico Sheehan DO on 03/11/2007   Method used:   Electronically sent to ...       [facility]       [address]       [place], [place]  [address]       Ph: [phone]       Fax: [phone]   RxID:   [id number]

## 2010-04-04 NOTE — Progress Notes (Signed)
Summary: refill/ hla  Phone Note Refill Request Message from:  Fax from Pharmacy on October 28, 2006 12:36 PM  Refills Requested: Medication #1:  LASIX 20 MG TABS Take 1 tablet by mouth once a day   Last Refilled: 7/19 Initial call taken by: Freddy Finner RN,  October 28, 2006 12:36 PM      Prescriptions: LASIX 20 MG TABS (FUROSEMIDE) Take 1 tablet by mouth once a day  #30 x 3   Entered and Authorized by:   Izora Gala Phifer MD   Signed by:   Efraim Kaufmann MD on 10/28/2006   Method used:   Electronically sent to ...       [facility] [address]       [place], [place]  [address]       Ph: [phone]       Fax: [phone]   RxID:   [id number]

## 2010-04-04 NOTE — Assessment & Plan Note (Signed)
Summary: EST-CK/FU/MEDS/CFB   Vital Signs:  Patient profile:   60 year old male Height:      71 inches (180.34 cm) Weight:      156.7 pounds (71.23 kg) BMI:     21.93 Temp:     97.7 degrees F (36.50 degrees C) oral Pulse rate:   59 / minute BP sitting:   194 / 96  (left arm) Cuff size:   REGUAR  Vitals Entered By: Lucky Rathke NT II (October 07, 2009 9:39 AM) CC: COMPLAIN OF PAIN -LEFT INDEX FINGER-THUMB-UPPER ARM FOR ABOUT 1 MONTH / MEDICATION REFILL Is Patient Diabetic? No Pain Assessment Patient in pain? yes     Location: head Onset of pain  `` Nutritional Status BMI of > 30 = obese  Have you ever been in a relationship where you felt threatened, hurt or afraid?No   Does patient need assistance? Functional Status Self care Ambulation Normal   CC:  COMPLAIN OF PAIN -LEFT INDEX FINGER-THUMB-UPPER ARM FOR ABOUT 1 MONTH / MEDICATION REFILL.  History of Present Illness: William Perkins is a 60 y/o man with pmh significant for HLD, HTN and GERD who presents to the clinic today for a general check-up. Patient has brought all medications to the clinic today. Does not report any complaints or concerns except tingling and numbness in left arm for past one month, not related to exertion. related to moving his hands, relived by aspirin/tylenol.   Also complaints of constipation, he usually takes laxatives which helps, asks if he can take laxatives longterm.   Patient is feeling well and denies CP, abdominal pain, nausea, vomiting, HA's, palpitations, blurred vision. fever, chills, diarrhea, constipation or SOB.   Preventive Screening-Counseling & Management  Alcohol-Tobacco     Alcohol drinks/day: 0     Smoking Status: current     Smoking Cessation Counseling: yes     Packs/Day: 1     Year Started: 1967  Caffeine-Diet-Exercise     Does Patient Exercise: yes     Type of exercise: WALKING  Current Medications (verified): 1)  Lisinopril 40 Mg Tabs (Lisinopril) .... Take 1  Tablet  By Mouth Once A Day 2)  Pravachol 40 Mg Tabs (Pravastatin Sodium) .... Take 1 Tablet By Mouth Once A Day 3)  Omeprazole 40 Mg Cpdr (Omeprazole) .... Take 1 Tablet By Mouth Once A Day 4)  Norvasc 10 Mg Tabs (Amlodipine Besylate) .... Take 1 Tablet By Mouth Once A Day 5)  Furosemide 40 Mg  Tabs (Furosemide) .... Take 1 Tablet By Mouth Once A Day 6)  Catapres 0.1 Mg Tabs (Clonidine Hcl) .... Take 1 Tablet By Mouth Two Times A Day 7)  Hydralazine Hcl 10 Mg  Tabs (Hydralazine Hcl) .... Take 2 Tablet By Mouth Two Times A Day 8)  Aspir-Low 81 Mg Tbec (Aspirin) .... Take 1 Tablet By Mouth Once A Day  Allergies (verified): No Known Drug Allergies  Social History: Does Patient Exercise:  yes  Review of Systems       Per HPI  Physical Exam  General:  alert and well-developed.   Head:  normocephalic and atraumatic.   Mouth:  pharynx pink and moist.   Neck:  supple, full ROM, and no masses.   Lungs:  normal respiratory effort, normal breath sounds, and no crackles.   Heart:  normal rate, regular rhythm, no murmur, no gallop, no rub, and no JVD.   Abdomen:  soft, non-tender, normal bowel sounds, no distention, and no masses.  Msk:  normal ROM.   Pulses:  R radial normal and L radial normal.   Extremities:  no LE edema noted  Neurologic:  alert & oriented X3 and cranial nerves II-XII intact.   Skin:  turgor normal, color normal, and no rashes.   Psych:  Oriented X3, memory intact for recent and remote, normally interactive, good eye contact, not anxious appearing, and not depressed appearing.     Impression & Recommendations:  Problem # 1:  HYPERTENSION (ICD-401.9) Patient has not taken his blood pressure medication today, he ran out 2 weeks ago, i explained the importance of taking his pills daily, I have filled all his scripts.   His updated medication list for this problem includes:    Lisinopril 40 Mg Tabs (Lisinopril) .Marland Kitchen... Take 1  tablet by mouth once a day    Norvasc 10 Mg Tabs  (Amlodipine besylate) .Marland Kitchen... Take 1 tablet by mouth once a day    Furosemide 40 Mg Tabs (Furosemide) .Marland Kitchen... Take 1 tablet by mouth once a day    Catapres 0.1 Mg Tabs (Clonidine hcl) .Marland Kitchen... Take 1 tablet by mouth two times a day    Hydralazine Hcl 10 Mg Tabs (Hydralazine hcl) .Marland Kitchen... Take 2 tablet by mouth two times a day  Problem # 2:  HYPERLIPIDEMIA (ICD-272.4) Patient states that lipitor is too expensive for him, as his LDL is well below goal, i have switched him to pravastatin.   His updated medication list for this problem includes:    Pravachol 40 Mg Tabs (Pravastatin sodium) .Marland Kitchen... Take 1 tablet by mouth once a day  Problem # 3:  RENAL INSUFFICIENCY, CHRONIC (ICD-585.9) as he will start taking all his blood pressure medication, will bring back in 2 weeks for a BMET.   Problem # 4:  GERD (ICD-530.81) Well controlled on current treatment, No new changes made today, Will continue to monitor.   His updated medication list for this problem includes:    Omeprazole 40 Mg Cpdr (Omeprazole) .Marland Kitchen... Take 1 tablet by mouth once a day  Problem # 5:  NUMBNESS, HAND (ICD-782.0) c/o of numbness of left hand for past one month, denies CP, and states that the numbness is not related to exertion, but rarther is related to moving his hand. symptoms are relived by ASA or tylenol, given his risk factors, I will add a baby asa, and may schedule a stress test on his next visit vs cardiology   Problem # 6:  VENTRICULAR HYPERTROPHY, LEFT (ICD-429.3) per #5  Complete Medication List: 1)  Lisinopril 40 Mg Tabs (Lisinopril) .... Take 1  tablet by mouth once a day 2)  Pravachol 40 Mg Tabs (Pravastatin sodium) .... Take 1 tablet by mouth once a day 3)  Omeprazole 40 Mg Cpdr (Omeprazole) .... Take 1 tablet by mouth once a day 4)  Norvasc 10 Mg Tabs (Amlodipine besylate) .... Take 1 tablet by mouth once a day 5)  Furosemide 40 Mg Tabs (Furosemide) .... Take 1 tablet by mouth once a day 6)  Catapres 0.1 Mg Tabs  (Clonidine hcl) .... Take 1 tablet by mouth two times a day 7)  Hydralazine Hcl 10 Mg Tabs (Hydralazine hcl) .... Take 2 tablet by mouth two times a day 8)  Aspir-low 81 Mg Tbec (Aspirin) .... Take 1 tablet by mouth once a day 9)  Metamucil 30.9 % Powd (Psyllium) .... Take one tablespoon and mix with water 2-3 times a day for constipation.  Patient Instructions: 1)  Please schedule a  follow-up appointment in 2 weeks. Prescriptions: METAMUCIL 30.9 % POWD (PSYLLIUM) Take one tablespoon and mix with water 2-3 times a day for constipation.  #1 x 0   Entered and Authorized by:   Vertell Limber MD   Signed by:   Vertell Limber MD on 10/07/2009   Method used:   Electronically to        [facility] (retail)       [address]       [place], [place]  [address]       Ph: [phone]       Fax: [phone]   RxID:   [id number] ASPIR-LOW 53 MG TBEC (ASPIRIN) Take 1 tablet by mouth once a day  #30 x 3   Entered and Authorized by:   Vertell Limber MD   Signed by:   Vertell Limber MD on 10/07/2009   Method used:   Electronically to        [facility] (retail)       [address]       [place], [place]  [address]       Ph: [phone]       Fax: [phone]   RxID:   [id number] PRAVACHOL 40 MG TABS (PRAVASTATIN SODIUM) Take 1 tablet by mouth once a day  #30 x 6   Entered and Authorized by:   Vertell Limber MD   Signed by:   Vertell Limber MD on 10/07/2009   Method used:   Electronically to        [facility] (retail)       [address]       [place], [place]  [address]       Ph: [phone]       Fax: [phone]   RxID:   [id number] HYDRALAZINE HCL 10 MG  TABS (HYDRALAZINE HCL) Take 2 tablet by mouth two times a day  #120 x 6   Entered and Authorized by:   Vertell Limber MD   Signed by:   Vertell Limber MD on 10/07/2009   Method used:   Electronically to        [facility] (retail)       [address]       [place], [place]  [address]       Ph: [phone]       Fax: [phone]   RxID:   [id number] CATAPRES 0.1 MG TABS (CLONIDINE HCL) Take 1 tablet by mouth two times a day  #60 x 6   Entered and Authorized by:   Vertell Limber MD   Signed by:   Vertell Limber MD on 10/07/2009   Method used:   Electronically to        [facility] (retail)       [address]       [place], [place]  [address]       Ph: [phone]       Fax: [phone]   RxID:   [id number] FUROSEMIDE 40 MG  TABS (FUROSEMIDE) Take 1 tablet by mouth once a day  #30 x 6   Entered and Authorized by:   Vertell Limber MD   Signed by:   Saralyn Pilar  Vanessa Kick MD on 10/07/2009   Method used:   Electronically to        [facility] (retail)       [address]       [place], [place]  [address]       Ph: [phone]       Fax: [phone]   RxID:   [id number] NORVASC 10 MG TABS (AMLODIPINE BESYLATE) Take 1 tablet by mouth once a day  #30 x 6   Entered and Authorized by:   Vertell Limber MD   Signed by:   Vertell Limber MD on 10/07/2009   Method used:   Electronically to        [facility] (retail)       [address]       [place], [place]  [address]       Ph: [phone]       Fax: [phone]   RxID:   [id number] OMEPRAZOLE 40 MG CPDR (OMEPRAZOLE) Take 1 tablet by mouth once a day  #30 x 6   Entered and Authorized by:   Vertell Limber MD   Signed by:   Vertell Limber MD on 10/07/2009   Method used:   Electronically to        [facility] (retail)       [address]       [place], [place]  [address]       Ph: [phone]       Fax: [phone]   RxID:   [id number] LISINOPRIL 40 MG TABS (LISINOPRIL) Take 1  tablet by mouth once a day  #30 x 2   Entered and Authorized by:   Vertell Limber MD   Signed by:   Vertell Limber MD on 10/07/2009   Method used:   Electronically to        [facility] (retail)       [address]       [place], [place]  [address]       Ph: [phone]       Fax: [phone]   RxID:   [id number]     Prevention & Chronic Care Immunizations   Influenza vaccine: Historical  (12/03/2008)   Influenza vaccine deferral: Refused  (12/15/2008)    Tetanus booster: Not documented   Td booster deferral: Deferred  (10/07/2009)    Pneumococcal vaccine: Not documented  Colorectal Screening   Hemoccult: Not documented   Hemoccult action/deferral: Ordered  (12/15/2008)    Colonoscopy: Not documented   Colonoscopy action/deferral: Refused  (04/28/2009)  Other Screening   PSA: Not documented   Smoking status: current  (10/07/2009)   Smoking cessation counseling: yes  (10/07/2009)  Lipids   Total Cholesterol: 148  (04/28/2009)   Lipid panel action/deferral: Lipid Panel ordered   LDL: 79  (04/28/2009)   LDL Direct: Not documented   HDL: 41  (04/28/2009)   Triglycerides: 141  (04/28/2009)    SGOT (AST): 23  (04/28/2009)   BMP action: Ordered   SGPT (ALT): 16  (04/28/2009)   Alkaline phosphatase: 77  (04/28/2009)   Total bilirubin: 0.5  (04/28/2009)    Lipid flowsheet reviewed?: Yes   Progress toward LDL goal: At goal  Hypertension   Last Blood Pressure: 194 /  96  (10/07/2009)   Serum creatinine: 1.63  (04/28/2009)   BMP action: Ordered   Serum potassium 4.0  (04/28/2009)    Hypertension flowsheet reviewed?: Yes   Progress toward BP goal: Deteriorated  Self-Management Support :   Personal Goals (by the next clinic visit) :      Personal blood pressure goal: 140/90  (04/28/2009)     Personal LDL goal: 100  (04/28/2009)    Patient will work on the following items until the next clinic visit to reach self-care goals:     Medications and monitoring: take my medicines every day, bring all of my medications to every visit   (10/07/2009)     Eating: drink diet soda or water instead of juice or soda, eat more vegetables, use fresh or frozen vegetables, eat foods that are low in salt, eat baked foods instead of fried foods, eat fruit for snacks and desserts, limit or avoid alcohol  (10/07/2009)     Activity: take a 30 minute walk every day  (12/15/2008)    Hypertension self-management support: Written self-care plan  (10/07/2009)   Hypertension self-care plan printed.    Lipid self-management support: Written self-care plan  (10/07/2009)   Lipid self-care plan printed.

## 2010-04-04 NOTE — Assessment & Plan Note (Signed)
Summary: f/u with Hypertension/cfb   Vital Signs:  Patient Profile:   60 Years Old Male Height:     71 inches Weight:      161.8 pounds Temp:     98.3 degrees F oral Pulse rate:   82 / minute BP sitting:   141 / 85  (right arm)  Pt. in pain?   no  Vitals Entered BySilverio Decamp (Aug 02, 2006 2:00 PM)              Is Patient Diabetic? No  Have you ever been in a relationship where you felt threatened, hurt or afraid?No   Does patient need assistance? Functional Status Self care Ambulation Normal   Chief Complaint:  BLOODPRESSURE.  History of Present Illness: [name] is a 60 yo AAM followed [facility] for HTN that has been somewhat difficult to control in the past, who presents today for a regularly scheduled follow up appointment.  He reports that he has been feeling well lately, and has no complaints.  He's been taking his BP meds as perscribed.  He denies any chest pain, leg swelling, decreased exercise tolerance, or shortness of breath.  He is still smoking but is interested still in trying to quit.    Current Allergies (reviewed today): No known allergies   Past Medical History:    Left Ventricular Hypertrophy    Hypertensive nephropathy (Microalb/cr ratio 704.5)    Renal Insufficiency, CrCl < 50 ml/min    Tobacco Abuse, 74 pk yr history       - failed chantix tx    GERD    Hyperlipidemia    Hypertension, Hypertensive urgency, hospitalized 1/12-1/15/07    Constipation    Periodontal disease    Risk Factors: Tobacco use:  current    Year started:  1967    Cigarettes:  Yes -- 1 pack(s) per day Drug use:  no Alcohol use:  no   Review of Systems  General      Denies fatigue, fever, and weakness.  CV      Denies chest pain or discomfort, lightheadness, shortness of breath with exertion, and swelling of feet.  Resp      Denies chest discomfort, cough, and shortness of breath.  Neuro      Denies numbness and weakness.   Physical Exam  General:  alert, well-developed, well-nourished, and well-hydrated.   Head:     normocephalic and atraumatic.   Eyes:     vision grossly intact, pupils equal, pupils round, and pupils reactive to light.   Mouth:     poor dentition, teeth missing, excessive plaque, and gingival recession.   Neck:     supple and no masses.   Lungs:     normal respiratory effort, no accessory muscle use, normal breath sounds, no crackles, and no wheezes.   Heart:     normal rate, regular rhythm, no murmur, no gallop, no rub, and no JVD.   Abdomen:     soft, non-tender, normal bowel sounds, no distention, and no masses.   Msk:     normal ROM, no joint deformities, and no joint instability.   Extremities:     no edema. Neurologic:     alert & oriented X3, cranial nerves II-XII intact, sensation intact to light touch, and gait normal.   Skin:     turgor normal and color normal.   Cervical Nodes:     no anterior cervical adenopathy.   Psych:  Oriented X3, memory intact for recent and remote, normally interactive, good eye contact, not anxious appearing, and not depressed appearing.      Impression & Recommendations:  Problem # 1:  HYPERTENSION (ICD-401.9) His BP, while still somewhat elevated, is better today.  In reveiwing his trend we have been gaining better control with each visit and medication adjustment.  Please see my prior notes on spironolactone as well as Dr. Rosezella [place].  I think at this point, the patient may warrant a trial of lower dose clonidine (had been mildly bradycardic at 0.2) before commiting to spironolactone.  Again, his BP continues to improve and I think he warrants a trial of clonidine, and if this doesn't work we could still try a Beta blocker which he has never been on.  I'll start 0.1mg  two times a day today of clonidine and have him return in 1 month for a blood pressure check.  At that point if his BP is till not at goal and his HR is regular we can increase the clonidine to 0.2mg   two times a day.   IF it is STILL not at goal we can revisit the question of adding beta blocker vs adding spironolactone.  I'm less inclined to add spironolactone 2/2 to her CRI, its side effect profile, and frequent lab monitoring in this working gentleman.  His updated medication list for this problem includes:    Lisinopril 40 Mg Tabs (Lisinopril) .Marland Kitchen... Take 2  tablets by mouth once a day    Norvasc 10 Mg Tabs (Amlodipine besylate) .Marland Kitchen... Take 1 tablet by mouth once a day    Lasix 20 Mg Tabs (Furosemide) .Marland Kitchen... Take 1 tablet by mouth once a day    Catapres 0.1 Mg Tabs (Clonidine hcl) .Marland Kitchen... 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  We have thus asked him to search local dentists that offer a sliding scale payment method.   Orders: Dental Referral (Dentist)   Problem # 3:  TOBACCO ABUSE (ICD-305.1) He did not do well with Chantix and has asked me to perscribe a nicotine patch.  We've gone over in detail how it is to be used.  I'm hopeful that this patient will be able to quit as he's very interested in trying and has made several attempts.   His updated medication list for this problem includes:    Nicoderm Cq 21 Mg/24hr Pt24 (Nicotine) .Marland Kitchen... Place 1 patch on a non-hairy skin of upper body or outer arm every morning.  rotate sites.  remove old patch before using a new one.   Problem # 4:  RENAL INSUFFICIENCY, CHRONIC (ICD-585.9) His BMET  ~4-5 weeks ago showed his Cr to be close to baseline of 1.8.  Problem # 5:  VENTRICULAR HYPERTROPHY, LEFT (ICD-429.3) His BP is very close to being controlled.  When he returns in 1 month we will send him back to [facility] as they would like to perform a stress test on this patient.   Medications Added to  Medication List This Visit: 1)  Catapres 0.1 Mg Tabs (Clonidine hcl) .... Take 1 tablet by mouth two times a day 2)  Nicoderm Cq 21 Mg/24hr Pt24 (Nicotine) .... Place 1 patch on a non-hairy skin of upper body or outer arm  every morning.  rotate sites.  remove old patch before using a new one.   Patient Instructions: 1)  Please schedule a follow-up appointment in 1 month. 2)  Congratulations on doing a good job lowering your blood pressure.  3)  Try to contact a local dentist that has a sliding scale payment system. 4)  Check your Blood Pressure regularly. Write down your measurements and bring them with you to your next appointment. 5)  Stop Smoking Tips: Choose a Quit date. Cut down before the Quit date. Start using the patch I perscribed, and decide what you will do as a substitute when you feel the urge to smoke(gum,toothpick,exercise). Prescriptions: NICODERM CQ 21 MG/24HR PT24 (NICOTINE) Place 1 patch on a non-hairy skin of upper body or outer arm every morning.  Rotate sites.  Remove old patch before using a new one.  #1 month x 1   Entered and Authorized by:   Katy Apo MD   Signed by:   Katy Apo MD on 08/02/2006   Method used:   Print then Give to Patient   [id number] CATAPRES 0.1 MG TABS (CLONIDINE HCL) Take 1 tablet by mouth two times a day  #62 x 5   Entered and Authorized by:   Katy Apo MD   Signed by:   Katy Apo MD on 08/02/2006   Method used:   Print then Give to Patient   RxID:   [id number]

## 2010-04-04 NOTE — Progress Notes (Signed)
Summary: Refill request/dms  Phone Note Refill Request Message from:  Patient on Jul 19, 2006 3:23 PM  Refills Requested: Medication #1:  NORVASC 10 MG TABS Take 1 tablet by mouth once a day  Method Requested: call to pharmacy Initial call taken by: Pollyann Samples,  Jul 19, 2006 3:23 PM  Follow-up for Phone Call        Refill approved-nurse to complete Follow-up by: Bertha Stakes MD,  Jul 22, 2006 9:58 AM  Additional Follow-up for Phone Call Additional follow up Details #1::        Rx called to pharmacy Additional Follow-up by: Pollyann Samples,  Jul 23, 2006 11:19 AM    Prescriptions: NORVASC 10 MG TABS (AMLODIPINE BESYLATE) Take 1 tablet by mouth once a day  #31 x 5   Entered and Authorized by:   Bertha Stakes MD   Signed by:   Bertha Stakes MD on 07/22/2006   Method used:   Telephoned to ...       [facility] [address]       [place] [address]       [place], [place]  [address]       Ph: [phone]       Fax: [phone]   RxID:   [id number]

## 2010-04-04 NOTE — Assessment & Plan Note (Signed)
Summary: 3WK FU/AGNEW/VS   Vital Signs:  Patient Profile:   60 Years Old Male Weight:      159.3 pounds (72.41 kg) Temp:     97.2 degrees F (36.22 degrees C) oral Pulse rate:   66 / minute BP supine:   187 / 95  (right arm)  Pt. in pain?   no  Vitals Entered By: Tor Netters RN (May 03, 2006 3:04 PM)              Is Patient Diabetic? No Nutritional Status Normal  Have you ever been in a relationship where you felt threatened, hurt or afraid?No   Does patient need assistance? Functional Status Self care Ambulation Normal  Prescriptions: LASIX 20 MG TABS (FUROSEMIDE) Take 1 tablet by mouth once a day  #30 x 5   Entered and Authorized by:   Katha Cabal MD   Signed by:   Katha Cabal MD on 05/03/2006   Method used:   Print then Give to Patient   RxID:   [id number]    Chief Complaint:  Hypertension Management.  History of Present Illness: PT is a 60 year old AAM with PMH significant for hypertension, noncardiac chest pain, CRF, tobacco abuse, and constipation presenting to clinic for blood pressure check.  Pt is currenlty asymptomatic.  He denies headache, hematuria, chest pain, blurred vision, and dizziness.  His blood pressure today is 187/95.  He is on norvasc and lisinopril.  His norvasc was increased to 10 mg q daily on 04/10/06.  Pt reports that he has been taking his medications.  Pt reports that he went in to get a stress test at [facility] cardiology this week but they would not do it secondary to his blood pressure being too high.  Pt has no complaints at this time.    Prior Medications (reviewed today): LISINOPRIL 20 MG TABS (LISINOPRIL) Take 1 tablet by mouth once a day ZOCOR 40 MG TABS (SIMVASTATIN) Take 1 tablet by mouth once a day at bedtime COLACE 100 MG CAPS (DOCUSATE SODIUM) Take 1 tablet by mouth two times a day PROTONIX 40 MG TBEC (PANTOPRAZOLE SODIUM) Take 1 tablet by mouth once a day NORVASC 10 MG TABS (AMLODIPINE BESYLATE) Take 1 tablet by mouth  once a day CHANTIX STARTING MONTH PAK 0.5 MG X 11 & 1 MG X 42 MISC (VARENICLINE TARTRATE) Take 0.5mg  daily days 1-3, then 0.5mg  twice a day days 4-7, the 1mg  once a day thereafter for 11 weeks Current Allergies (reviewed today): No known allergies     Risk Factors:    Review of Systems       Pt has no complaints   Physical Exam  General:     alert, well-developed, and well-nourished.   Head:     normocephalic and atraumatic.   Neck:     supple and no carotid bruits.   Lungs:     normal respiratory effort, no intercostal retractions, no accessory muscle use, normal breath sounds, no crackles, and no wheezes.   Heart:     normal rate, regular rhythm, and no murmur.   Abdomen:     soft, non-tender, and normal bowel sounds.   Msk:     normal ROM.   Extremities:     No clubbing, cyanosis, edema, or deformity noted with normal full range of motion of all joints.   Neurologic:     alert & oriented X3.   Skin:     no rashes and no edema.  Psych:     Oriented X3.      Impression & Recommendations:  Problem # 1:  HYPERTENSION (ICD-401.9) PT is still now controlled.  He is asymptomatic today.  Will start him on Lasix 20mg  by mouth q daily today.  Will have him follow up in one week to recheck his blood pressure and to check a BMET to evaluate his potassium and creatinine.   His updated medication list for this problem includes:    Lisinopril 20 Mg Tabs (Lisinopril) .Marland Kitchen... Take 1 tablet by mouth once a day    Norvasc 10 Mg Tabs (Amlodipine besylate) .Marland Kitchen... Take 1 tablet by mouth once a day    Lasix 20 Mg Tabs (Furosemide) .Marland Kitchen... Take 1 tablet by mouth once a day  Future Orders: T-Basic Metabolic Panel (99991111) ... 05/10/2006   Problem # 2:  HYPERLIPIDEMIA (ICD-272.4) No change to medications.   His updated medication list for this problem includes:    Zocor 40 Mg Tabs (Simvastatin) .Marland Kitchen... Take 1 tablet by mouth once a day at bedtime   Problem # 3:  RENAL  INSUFFICIENCY, CHRONIC (ICD-585.9) Will check a BMET in one week.  Pt likely has CRI secondary to poorly controlled hypertension.    Problem # 4:  GERD (ICD-530.81) No change to medications.  Pt has no complaints at this time.  His updated medication list for this problem includes:    Protonix 40 Mg Tbec (Pantoprazole sodium) .Marland Kitchen... Take 1 tablet by mouth once a day   Medications Added to Medication List This Visit: 1)  Lasix 20 Mg Tabs (Furosemide) .... Take 1 tablet by mouth once a day   Patient Instructions: 1)  Please schedule a follow-up appointment in 1 week to recheck your blood pressure and a BMET to make sure your potassium and your creatinine are ok. 2)  Please take medications as prescribed.  3)  Limit intake of Sodium (Salt).

## 2010-04-04 NOTE — Assessment & Plan Note (Signed)
Summary: ACUTE-REQUESTING MEDICATION REFILLS/(TOBBIA)/CFB   Vital Signs:  Patient profile:   60 year old male Height:      71 inches (180.34 cm) Weight:      159.5 pounds (72.50 kg) BMI:     22.33 Temp:     97.5 degrees F (36.39 degrees C) rectal Pulse rate:   58 / minute BP sitting:   153 / 81  (left arm) Cuff size:   regular  Vitals Entered By: Nadine Counts Deborra Medina) (December 15, 2008 2:45 PM) CC: pt has different insurance company and they would not pay for protonix, requests rx for another PPI Is Patient Diabetic? No Pain Assessment Patient in pain? no      Nutritional Status BMI of 19 -24 = normal  Have you ever been in a relationship where you felt threatened, hurt or afraid?No   Does patient need assistance? Functional Status Self care Ambulation Normal    CC:  pt has different insurance company and they would not pay for protonix and requests rx for another PPI.  History of Present Illness: No complaints at this visit other than a cyst on R hand that has been there ever since June of this year. It had been there once before, but never as big as it is now. It is not painful, just cosmetically unappealing. Otherwise, the patient is feeling healthy and reports good compliance with medications.  Preventive Screening-Counseling & Management  Alcohol-Tobacco     Smoking Status: current     Smoking Cessation Counseling: yes     Packs/Day: 1     Year Started: 1967  Current Problems (verified): 1)  Hypertension  (ICD-401.9) 2)  Hyperlipidemia  (ICD-272.4) 3)  Renal Insufficiency, Chronic  (ICD-585.9) 4)  Ventricular Hypertrophy, Left  (ICD-429.3) 5)  Tobacco Abuse  (ICD-305.1) 6)  Gerd  (ICD-530.81) 7)  Lumbar Strain  (ICD-847.2)  Medications Prior to Update: 1)  Lisinopril 40 Mg Tabs (Lisinopril) .... Take 1  Tablet By Mouth Once A Day 2)  Lipitor 80 Mg Tabs (Atorvastatin Calcium) .... Take 1 Tablet By Mouth At Bedtime 3)  Protonix 40 Mg Tbec (Pantoprazole  Sodium) .... Take 1 Tablet By Mouth Once A Day 4)  Norvasc 10 Mg Tabs (Amlodipine Besylate) .... Take 1 Tablet By Mouth Once A Day 5)  Furosemide 40 Mg  Tabs (Furosemide) .... Take 1 Tablet By Mouth Once A Day 6)  Catapres 0.1 Mg Tabs (Clonidine Hcl) .... Take 1 Tablet By Mouth Two Times A Day 7)  Hydralazine Hcl 10 Mg  Tabs (Hydralazine Hcl) .... Take 1 Tablet By Mouth Two Times A Day 8)  Nicotrol 10 Mg Inha (Nicotine) .... Use As Often As Needed When You Feel The Urge To Smoke.  Use At Least 6 Cartridges Per Day, Max of 16.  Current Medications (verified): 1)  Lisinopril 40 Mg Tabs (Lisinopril) .... Take 1  Tablet By Mouth Once A Day 2)  Lipitor 80 Mg Tabs (Atorvastatin Calcium) .... Take 1 Tablet By Mouth At Bedtime 3)  Protonix 40 Mg Tbec (Pantoprazole Sodium) .... Take 1 Tablet By Mouth Once A Day 4)  Norvasc 10 Mg Tabs (Amlodipine Besylate) .... Take 1 Tablet By Mouth Once A Day 5)  Furosemide 40 Mg  Tabs (Furosemide) .... Take 1 Tablet By Mouth Once A Day 6)  Catapres 0.1 Mg Tabs (Clonidine Hcl) .... Take 1 Tablet By Mouth Two Times A Day 7)  Hydralazine Hcl 10 Mg  Tabs (Hydralazine Hcl) .... Take 1 Tablet By  Mouth Two Times A Day  Allergies (verified): No Known Drug Allergies  Past History:  Past Medical History: Last updated: 08/02/2006 Left Ventricular Hypertrophy Hypertensive nephropathy (Microalb/cr ratio 704.5) Renal Insufficiency, CrCl < 50 ml/min Tobacco Abuse, 74 pk yr history    - failed chantix tx GERD Hyperlipidemia Hypertension, Hypertensive urgency, hospitalized 1/12-1/15/07 Constipation Periodontal disease  Social History: Last updated: 12/15/2008 Current Smoker, 2ppd x 30 years, now at 1 ppd after attempt at quitting Alcohol use-no, former heavy Drug use-no Occupation: Probation officer, runs [employer], no kids.  Risk Factors: Exercise: no (07/15/2008)  Risk Factors: Smoking Status: current (12/15/2008) Packs/Day: 1 (12/15/2008)  Social  History: Current Smoker, 2ppd x 30 years, now at 1 ppd after attempt at quitting Alcohol use-no, former heavy Drug use-no Occupation: Probation officer, runs [employer], no kids.  Review of Systems General:  Denies chills, fatigue, fever, sweats, and weakness. Eyes:  Denies blurring and vision loss-both eyes. ENT:  Denies decreased hearing and sinus pressure. CV:  Denies chest pain or discomfort, difficulty breathing at night, difficulty breathing while lying down, fainting, lightheadness, near fainting, palpitations, swelling of feet, and swelling of hands. Resp:  Denies chest pain with inspiration, cough, sputum productive, and wheezing. GI:  Denies abdominal pain, bloody stools, change in bowel habits, constipation, diarrhea, nausea, and vomiting. GU:  Denies dysuria, hematuria, urinary frequency, and urinary hesitancy. Derm:  Denies changes in color of skin and rash.  Physical Exam  General:  alert, well-developed, well-nourished, and well-hydrated.   Head:  normocephalic and atraumatic.   Eyes:  vision grossly intact.   Ears:  no external deformities.   Nose:  no external deformity.   Mouth:  pharynx pink and moist, no erythema, no exudates, poor dentition, and teeth missing.   Neck:  supple, full ROM, no masses, no thyromegaly, and no thyroid nodules or tenderness.   Lungs:  normal respiratory effort, no intercostal retractions, no accessory muscle use, normal breath sounds, no crackles, and no wheezes.   Heart:  regular rhythm, no murmur, no gallop, no rub, and borderline bradycardia.   Abdomen:  soft, non-tender, normal bowel sounds, no guarding, no rigidity, and no rebound tenderness.   Msk:  Large 2 cm wide x 4 cm long (approx) cyst like swelling on dorsum of R hand. Smaller 1/2 cm x 1/2 cm swelling just proximal to larger swelling on R hand. Pulses:  R radial normal and L radial normal.   Neurologic:  alert & oriented X3 and cranial nerves II-XII intact.   Skin:  turgor  normal, color normal, and no rashes.   Cervical Nodes:  no anterior cervical adenopathy and no posterior cervical adenopathy.   Psych:  Oriented X3, memory intact for recent and remote, normally interactive, good eye contact, not anxious appearing, and not depressed appearing.     Impression & Recommendations:  Problem # 1:  HYPERTENSION (ICD-401.9) Increased hydralazine to 2 tablets twice daily for total dose of 20 mg two times a day. Consider increasing clonidine if hypertension persists at next appointment. Patient wanted to return to two times a day dosing of lisinopril, but given kidney function, decided hydralizine or clonidine increase would be safer. Checking BMET today. Reassess BP at next visit.  His updated medication list for this problem includes:    Lisinopril 40 Mg Tabs (Lisinopril) .Marland Kitchen... Take 1  tablet by mouth once a day    Norvasc 10 Mg Tabs (Amlodipine besylate) .Marland Kitchen... Take 1 tablet by mouth once a day  Furosemide 40 Mg Tabs (Furosemide) .Marland Kitchen... Take 1 tablet by mouth once a day    Catapres 0.1 Mg Tabs (Clonidine hcl) .Marland Kitchen... Take 1 tablet by mouth two times a day    Hydralazine Hcl 10 Mg Tabs (Hydralazine hcl) .Marland Kitchen... Take 2 tablet by mouth two times a day  Orders: T-Basic Metabolic Panel (99991111)  Problem # 2:  Sx of DISORDERS OF SOFT TISSUE UNSPECIFIED (ICD-729.90) May be dorsal ganglion cysts, but very soft - will refer to orthopedics for evaluation and possible aspiration/injection.  Orders: Orthopedic Referral (Ortho)  Problem # 3:  HYPERLIPIDEMIA (B2193296.4) Was taking simvastatin 40 (brought with meds today). Had been switched to Lipitor before - will refill Lipitor 80mg  daily.  His updated medication list for this problem includes:    Lipitor 80 Mg Tabs (Atorvastatin calcium) .Marland Kitchen... Take 1 tablet by mouth at bedtime  Problem # 4:  GERD (ICD-530.81) Changed pantoprazole to omeprazole - patient's insurance will not cover protonix. Told patient that side  effect profile is similar as is the mechanism of action and to call if he notices any changes with the new medication.  His updated medication list for this problem includes:    Omeprazole 40 Mg Cpdr (Omeprazole) .Marland Kitchen... Take 1 tablet by mouth once a day  Problem # 5:  RENAL INSUFFICIENCY, CHRONIC (ICD-585.9) Will re-evaluate with BMET.  Orders: T-Basic Metabolic Panel (99991111)  Problem # 6:  Preventive Health Care (ICD-V70.0) Patient agrees to hemoccult cards, will think about colonscopy. Refuses tetanus and flu vaccine today. States he gets flu vax at work.  Complete Medication List: 1)  Lisinopril 40 Mg Tabs (Lisinopril) .... Take 1  tablet by mouth once a day 2)  Lipitor 80 Mg Tabs (Atorvastatin calcium) .... Take 1 tablet by mouth at bedtime 3)  Omeprazole 40 Mg Cpdr (Omeprazole) .... Take 1 tablet by mouth once a day 4)  Norvasc 10 Mg Tabs (Amlodipine besylate) .... Take 1 tablet by mouth once a day 5)  Furosemide 40 Mg Tabs (Furosemide) .... Take 1 tablet by mouth once a day 6)  Catapres 0.1 Mg Tabs (Clonidine hcl) .... Take 1 tablet by mouth two times a day 7)  Hydralazine Hcl 10 Mg Tabs (Hydralazine hcl) .... Take 2 tablet by mouth two times a day  Other Orders: T-Hemoccult Card-Multiple (take home) [id number])  Patient Instructions: 1)  Please schedule a follow-up appointment in 1 month. 2)  Check your Blood Pressure regularly. Prescriptions: CATAPRES 0.1 MG TABS (CLONIDINE HCL) Take 1 tablet by mouth two times a day  #60 x 2   Entered and Authorized by:   Luvenia Starch MD   Signed by:   Luvenia Starch MD on 12/15/2008   Method used:   Electronically to        [facility] (retail)       [address]       [place], [place]  [address]       Ph: [phone]       Fax: [phone]   RxID:   [id number] HYDRALAZINE HCL 10 MG  TABS (HYDRALAZINE HCL) Take 2 tablet by mouth two times a day  #120 x 2   Entered and Authorized by:   Luvenia Starch MD   Signed by:   Luvenia Starch MD on 12/15/2008   Method used:   Electronically to        [facility] (retail)  [place], [place]  [address]       Ph: [phone]       Fax: [phone]   RxID:   [id number] OMEPRAZOLE 40 MG CPDR (OMEPRAZOLE) Take 1 tablet by mouth once a day  #30 x 2   Entered and Authorized by:   Luvenia Starch MD   Signed by:   Luvenia Starch MD on 12/15/2008   Method used:   Electronically to        [facility] (retail)       [address]       [place], [place]  [address]       Ph: [phone]       Fax: [phone]   RxID:   [id number]   Prevention & Chronic Care Immunizations   Influenza vaccine: Not documented   Influenza vaccine deferral: Refused  (12/15/2008)    Tetanus booster: Not documented    Pneumococcal vaccine: Not documented    Immunization comments: Patient gets flu shot at job.  Colorectal Screening   Hemoccult: Not documented   Hemoccult action/deferral: Ordered  (12/15/2008)    Colonoscopy: Not documented  Other Screening   PSA: Not documented   Smoking status: current  (12/15/2008)   Smoking cessation counseling: yes  (12/15/2008)  Lipids   Total Cholesterol: 221  (07/15/2008)   LDL: 151  (07/15/2008)   LDL Direct: Not documented   HDL: 42  (07/15/2008)   Triglycerides: 141  (07/15/2008)    SGOT (AST): 17  (01/02/2008)   SGPT (ALT): 15  (01/02/2008)   Alkaline phosphatase: 55  (01/02/2008)   Total bilirubin: 0.5  (01/02/2008)    Lipid flowsheet reviewed?: Yes   Progress toward LDL goal: Unchanged  Hypertension   Last Blood Pressure: 153 / 81  (12/15/2008)   Serum creatinine: 1.84  (07/15/2008)   Serum potassium 4.2  (07/15/2008)    Hypertension flowsheet reviewed?: Yes   Progress toward BP goal: Deteriorated  Self-Management Support :   Personal Goals (by the next clinic visit) :      Personal blood  pressure goal: 130/80  (12/15/2008)     Personal LDL goal: 100  (12/15/2008)    Patient will work on the following items until the next clinic visit to reach self-care goals:     Medications and monitoring: take my medicines every day  (12/15/2008)     Eating: eat more vegetables, eat foods that are low in salt, eat baked foods instead of fried foods  (12/15/2008)     Activity: take a 30 minute walk every day  (12/15/2008)    Hypertension self-management support: Written self-care plan  (12/15/2008)   Hypertension self-care plan printed.    Lipid self-management support: Written self-care plan  (12/15/2008)   Lipid self-care plan printed.   Nursing Instructions: Provide Hemoccult cards with instructions (see order)   Process Orders Check Orders Results:     [facility]: [id number] not required for this insurance Tests Sent for requisitioning (December 16, 2008 3:52 PM):     12/15/2008: [facility] -- T-Basic Metabolic Panel [id number] (signed)

## 2010-04-04 NOTE — Assessment & Plan Note (Signed)
Summary: FU APPT (DR. AGNEW)/DS   Vital Signs:  Patient Profile:   60 Years Old Male Weight:      163.8 pounds (74.45 kg) Temp:     97.2 degrees F (36.22 degrees C) oral Pulse rate:   65 / minute BP sitting:   159 / 87  Pt. in pain?   no  Vitals Entered By: Tenna Delaine RN (June 18, 2006 4:10 PM)              Is Patient Diabetic? No Nutritional Status Normal  Have you ever been in a relationship where you felt threatened, hurt or afraid?Unable to ask   Does patient need assistance? Functional Status Self care Ambulation Normal   Chief Complaint:  f/u.  History of Present Illness: Mr. Stude is a 60 yo AAM who we've been following in [facility] for HTN that has been quite difficult to control despite several changes and increases in his BP regimen.  Mr. Gavidia reports that he feels ok, and has had no real complaints lately.  He does note that Chantix and his effort to quite smoking was unsuccessful.  His wife is with him today and she feels that Mr. Mihalek is depressed, anxious, or both.  Mr. Speciale adamantly disagrees.  Prior Medications: ZOCOR 40 MG TABS (SIMVASTATIN) Take 1 tablet by mouth once a day at bedtime COLACE 100 MG CAPS (DOCUSATE SODIUM) Take 1 tablet by mouth two times a day PROTONIX 40 MG TBEC (PANTOPRAZOLE SODIUM) Take 1 tablet by mouth once a day NORVASC 10 MG TABS (AMLODIPINE BESYLATE) Take 1 tablet by mouth once a day LASIX 20 MG TABS (FUROSEMIDE) Take 1 tablet by mouth once a day Current Allergies: No known allergies     Risk Factors:     Year started:  54    Physical Exam  General:     alert, well-developed, and well-nourished.   Head:     normocephalic and atraumatic.   Eyes:     vision grossly intact, pupils equal, pupils round, and pupils reactive to light.   Nose:     no external deformity.   Mouth:     poor dentition, teeth missing, excessive plaque, and halitosis.   Neck:     supple, full ROM, and no masses.   Lungs:     normal  respiratory effort, no accessory muscle use, normal breath sounds, no crackles, and no wheezes.   Heart:     normal rate, regular rhythm, no murmur, no gallop, and no rub.   Abdomen:     soft, non-tender, normal bowel sounds, no distention, and no masses.   Msk:     normal ROM, no joint tenderness, and no joint swelling.   Pulses:     R radial normal.   Neurologic:     alert & oriented X3, sensation intact to light touch, and gait normal.   Skin:     turgor normal, color normal, no rashes, and no suspicious lesions.   Cervical Nodes:     no anterior cervical adenopathy.   Psych:     Oriented X3, memory intact for recent and remote, normally interactive, good eye contact, not anxious appearing, and not depressed appearing.      Impression & Recommendations:  Problem # 1:  HYPERTENSION (ICD-401.9) Please see my last note from March in regards to this.  I've spoken at lenght regarding the patient's current regiman with Dr. Sharlet Salina today.  I've also read Dr. Rosezella Florida note and Dr. Nelda Severe  flag.  In summary, Dr. Sharlet Salina thought it would be best to rule out any form of hyperaldosterone state prior to starting spironolactone as this would cloud the w/u for > 6 weeks.  In the interim we will again double his dose of lisinopril.  I will ask himto come back to clinic in 1 month for recheck of his BP.  IF it is still not at goal and his renin and aldo levels are normal we can start spironolactone.   We will then need to refer him back to Dr. Percival Spanish for the rest of his cards w/u.  His updated medication list for this problem includes:    Lisinopril 40 Mg Tabs (Lisinopril) .Marland Kitchen... Take 2  tablets by mouth once a day    Norvasc 10 Mg Tabs (Amlodipine besylate) .Marland Kitchen... Take 1 tablet by mouth once a day    Lasix 20 Mg Tabs (Furosemide) .Marland Kitchen... Take 1 tablet by mouth once a day  Future Orders: T- * Misc. Laboratory test [id number]) ... 06/19/2006   Problem # 2:  TOBACCO ABUSE (ICD-305.1) I'm removing  Chantix from his med list as he reports that he had no success with it.  His wife is with him during the visit and she is very concerned, as am I, that he continues to smoke.  He has tried nicotine gum in the past with no success as well.  I would really like to have the patient see Skipper Cliche.  He says that he is not ready at the moment for me to make the appointment with her.  I've strongly encouraged him to reconsider,and we will re-visit this topic during his return visit.   The following medications were removed from the medication list:    Chantix Starting Month Pak 0.5 Mg X 11 & 1 Mg X 42 Misc (Varenicline tartrate) .Marland Kitchen... Take 0.5mg  daily days 1-3, then 0.5mg  twice a day days 4-7, the 1mg  once a day thereafter for 11 weeks   Problem # 3:  RENAL INSUFFICIENCY, CHRONIC (ICD-585.9) He never returned for a BMET after his last visit in March.  We will check it when he comes in for his am renin and aldo level.  Future Orders: T-Basic Metabolic Panel (99991111) ... 06/19/2006   Medications Added to Medication List This Visit: 1)  Lisinopril 40 Mg Tabs (Lisinopril) .... Take 2  tablets by mouth once a day   Patient Instructions: 1)  Please schedule a follow-up appointment in 1 month. 2)  Please return in the morning for a blood/lab draw - you can go afterwards. 3)  I'm doubling your lisinopril dose today:  Please take two of your Lisinopril 40mg  pills each day.  I'm giving your refills for this today as well. 4)  Please consider letting me make an appointment for you with Skipper Cliche regarding smoking.  5)  Keep up the effort - your blood pressure is improving and you are making progress. Prescriptions: LISINOPRIL 40 MG TABS (LISINOPRIL) Take 2  tablets by mouth once a day  #60 x 5   Entered and Authorized by:   Katy Apo MD   Signed by:   Katy Apo MD on 06/18/2006   Method used:   Print then Give to Patient   RxID:   [id number]   Appended Document: FU APPT (DR.  AGNEW)/DS I would also like to refer him to CSW for trying to plug him into a dentist for significant dental plaque, regular oral screening given his tobacco use.

## 2010-04-04 NOTE — Progress Notes (Signed)
Summary: med refill/gp  page 1  Phone Note Refill Request Message from:  Patient on December 10, 2008 1:55 PM  Refills Requested: Medication #1:  LISINOPRIL 40 MG TABS Take 1  tablet by mouth once a day  Medication #2:  LIPITOR 80 MG TABS Take 1 tablet by mouth at bedtime  Medication #3:  PROTONIX 40 MG TBEC Take 1 tablet by mouth once a day  Medication #4:  NORVASC 10 MG TABS Take 1 tablet by mouth once a day  Method Requested: Electronic Initial call taken by: Morrison Old RN,  December 10, 2008 1:54 PM  Follow-up for Phone Call        Refilled electronically.  Follow-up by: Bertha Stakes MD,  December 10, 2008 2:29 PM    Prescriptions: NORVASC 10 MG TABS (AMLODIPINE BESYLATE) Take 1 tablet by mouth once a day  #30 x 2   Entered and Authorized by:   Bertha Stakes MD   Signed by:   Bertha Stakes MD on 12/10/2008   Method used:   Electronically to        [facility] (retail)       [address]       [place], [place]  [address]       Ph: [phone]       Fax: [phone]   RxID:   [id number] PROTONIX 40 MG TBEC (PANTOPRAZOLE SODIUM) Take 1 tablet by mouth once a day  #30 x 2   Entered and Authorized by:   Bertha Stakes MD   Signed by:   Bertha Stakes MD on 12/10/2008   Method used:   Electronically to        [facility] (retail)       [address]       [place], [place]  [address]       Ph: [phone]       Fax: [phone]   RxID:   [id number] LIPITOR 80 MG TABS (ATORVASTATIN CALCIUM) Take 1 tablet by mouth at bedtime  #30 x 2   Entered and Authorized by:   Bertha Stakes MD   Signed by:   Bertha Stakes MD on 12/10/2008   Method used:   Electronically to        [facility] (retail)       [address]       [place], [place]  [address]       Ph: [phone]       Fax: [phone]   [id number] LISINOPRIL 40 MG TABS (LISINOPRIL) Take 1  tablet by mouth  once a day  #30 x 2   Entered and Authorized by:   Bertha Stakes MD   Signed by:   Bertha Stakes MD on 12/10/2008   Method used:   Electronically to        [facility] (retail)       [address]       [place],   [address]       Ph: [phone]       Fax: [phone]   RxID:   [id number]

## 2010-04-04 NOTE — Assessment & Plan Note (Signed)
Summary: EST-CK/FU/MEDS/CFB PER [facility]LE/CFB   Vital Signs:  Patient Profile:   60 Years Old Male Height:     71 inches (180.34 cm) Weight:      167.5 pounds (76.14 kg) BMI:     23.45 Temp:     97.4 degrees F (36.33 degrees C) oral Pulse rate:   67 / minute BP sitting:   162 / 82  (right arm) Cuff size:   large  Vitals Entered By: Lucky Rathke (March 28, 2007 10:04 AM)             Is Patient Diabetic? No Nutritional Status NORMAL  Have you ever been in a relationship where you felt threatened, hurt or afraid?No   Does patient need assistance? Functional Status Self care Ambulation Normal     Chief Complaint:  MEDICATION REFILL /  FU ON BLOOD PRESSURE.  History of Present Illness: 60 yo M known to me who presents for a BP check.  He was checking his BP at home and he states that his BP usually runs 120-140s when he checks it which is once a week.  He was having some problems getting his meds (he needed an appt) back at the end of Nov and early Dec, but has been taking his meds every day since then.  He is compliant with his meds.  He has no side effects.   He is also c/o ED and wants to try a medicine for it. He denies any F/C/NS/weight loss/CP/SOB/N/V/D/abd pain/edema/blood from any orifice.  Has some occasional constipation, but does well with the stool softener.  Current Allergies (reviewed today): No known allergies   Past Medical History:    Reviewed history from 08/02/2006 and no changes required:       Left Ventricular Hypertrophy       Hypertensive nephropathy (Microalb/cr ratio 704.5)       Renal Insufficiency, CrCl < 50 ml/min       Tobacco Abuse, 74 pk yr history          - failed chantix tx       GERD       Hyperlipidemia       Hypertension, Hypertensive urgency, hospitalized 1/12-1/15/07       Constipation       Periodontal disease   Social History:    Reviewed history from 03/22/2006 and no changes required:       Current Smoker, 2ppd x 30  years       Alcohol use-no, former heavy       Drug use-no       Occupation: Editor, commissioning   Risk Factors:  Tobacco use:  current    Year started:  1967    Cigarettes:  Yes -- 1 pack(s) per day Drug use:  no Alcohol use:  no Exercise:  no Seatbelt use:  100 %   Review of Systems      See HPI   Physical Exam  General:     Well-developed,well-nourished,in no acute distress; alert,appropriate and cooperative throughout examination Eyes:     PERRL, EOMI, anicteric, muddy sclera Mouth:     OP clear, poor dentition, mmm Neck:     supple, full ROM, and no masses.   Lungs:     Clear Heart:     normal rate, regular rhythm, and grade 3/6 systolic murmur loudest at LSB. Abdomen:     soft, non-tender, and normal bowel sounds.   Pulses:     R radial normal  and L radial normal.   Extremities:     No edmea Neurologic:     alert & oriented X3.   Skin:     Intact without suspicious lesions or rashes Cervical Nodes:     no anterior cervical adenopathy and no posterior cervical adenopathy.   Axillary Nodes:     no R axillary adenopathy and no L axillary adenopathy.   Psych:     Appropriate    Impression & Recommendations:  Problem # 1:  HYPERTENSION (ICD-401.9) The pts BP is still not at goal.  Have increased his Lasix to 40mg  daily.  He will RTC in 2 weeks for a BP check and a repeat BMET for his Cr and K.  If his BP is still high at that time, would add Spironolactone (if pt can RTC frequently for K checks: 3 days after starting, then 1 week after starting, then Q2-4 weeks for 3 months, then Q3-6 months).  If he can't RTC for frequent lab checks, would consider changing Clonidine to Coreg (can't increase Clonidine due to low HR).  ED likely directly related to uncontrolled htn.  Have given pt an RX for Viagra with instructions on use.  Will check a CMET and TSh today.  Once BP under better control, Dr. Percival Spanish would like to do a stress test. Orders: T-Comprehensive  Metabolic Panel ([id number]) T-TSH [id number])   Problem # 2:  ANEMIA NOS (ICD-285.9) Hx of anemia, likely from CRI.  Will check CBC today and send pt home with stool cards.  Will also schedule a screening colonoscopy. Orders: T-CBC w/Diff [id number])  Future Orders: T-Hemoccult Card-Multiple (take home) ([id number]) ... 04/02/2007   Problem # 3:  HYPERLIPIDEMIA (ICD-272.4) Pt is due for a FLP, but he has eaten today so he will RTC fasting and have all of his labs drawn at that time.  Will adjust meds accordingly. His updated medication list for this problem includes:    Zocor 40 Mg Tabs (Simvastatin) .Marland Kitchen... Take 1 tablet by mouth once a day at bedtime  Future Orders: T-Lipid Profile [id number]) ... 04/02/2007   Problem # 4:  CONSTIPATION NOS (ICD-564.00) Pt was off his stool softener, have restarted this.  Denies blood loss, will send pt home with stool cards and arrange a colonoscopy as well.  Problem # 5:  Preventive Health Care (ICD-V70.0) Offered pt colonoscopy screeening - will arrange.  Offered prostate CA screening with PSA, pt wants to think about it.  Will send pt home with stool cards today.  Complete Medication List: 1)  Lisinopril 40 Mg Tabs (Lisinopril) .... Take 2  tablets by mouth once a day 2)  Zocor 40 Mg Tabs (Simvastatin) .... Take 1 tablet by mouth once a day at bedtime 3)  Colace 100 Mg Caps (Docusate sodium) .... Take 1 tablet by mouth at bedtime 4)  Protonix 40 Mg Tbec (Pantoprazole sodium) .... Take 1 tablet by mouth once a day 5)  Norvasc 10 Mg Tabs (Amlodipine besylate) .... Take 1 tablet by mouth once a day 6)  Furosemide 40 Mg Tabs (Furosemide) .... Take 1 tablet by mouth once a day 7)  Catapres 0.1 Mg Tabs (Clonidine hcl) .... Take 1 tablet by mouth two times a day 8)  Nicotine 21-14-7 Mg/24hr Kit (Nicotine) .... Place a 21mg  patch on the skin of your arm once a day, removing the old patch before placing a new one.  use each dose (21, 14 or 7) for 2  weeks, then stop. 9)  Viagra 50 Mg Tabs (Sildenafil citrate) .... Take 1/2 a tablet 0.5 to 4 hours before intercourse, if no results, take the other 1/2 for a maximum of 50mg /day.  max dose is 50mg /day.   Patient Instructions: 1)  Please schedule a follow-up appointment in 2 weeks for a blood pressure check and some bloodwork. 2)  Increase your Furosemide (=Lasix) to 40mg  daily - you can take 2 of the 20mg  tablets until they run out, then fill the new prescription for the 40mg  tablets. 3)  You will be called if there are any abnormalities in your bloodwork. 4)  Remember to come back in fasting (=not having eaten breakfast) one morning next week for some bloodwork. 5)  Use the Viagra when needed, but not more than once a day (50mg  max) - see instructions below.    Prescriptions: NICOTINE 21-14-7 MG/24HR  KIT (NICOTINE) Place a 21mg  patch on the skin of your arm once a day, removing the old patch before placing a new one.  Use each dose (21, 14 or 7) for 2 weeks, then stop.  #1 kit x 0   Entered and Authorized by:   Rico Sheehan DO   Signed by:   Rico Sheehan DO on 03/28/2007   Method used:   Electronically sent to ...       [facility]       [address]       [place], [place]  [address]       Ph: [phone]       Fax: [phone]   RxID:   [id number] VIAGRA 50 MG  TABS (SILDENAFIL CITRATE) Take 1/2 a tablet 0.5 to 4 hours before intercourse, if no results, take the other 1/2 for a maximum of 50mg /day.  Max dose is 50mg /day.  #15 x 3   Entered and Authorized by:   Rico Sheehan DO   Signed by:   Rico Sheehan DO on 03/28/2007   Method used:   Electronically sent to ...       [facility]       [address]       [place], [place]  [address]       Ph: [phone]       Fax: [phone]   RxID:   [id number] CATAPRES 0.1 MG TABS (CLONIDINE HCL) Take 1 tablet by mouth two times a day  #60 x 3   Entered  and Authorized by:   Rico Sheehan DO   Signed by:   Rico Sheehan DO on 03/28/2007   Method used:   Electronically sent to ...       [facility]       [address]       [place], [place]  [address]       Ph: [phone]       Fax: [phone]   RxID:   [id number] FUROSEMIDE 40 MG  TABS (FUROSEMIDE) Take 1 tablet by mouth once a day  #30 x 3   Entered and Authorized by:   Rico Sheehan DO   Signed by:   Rico Sheehan DO on 03/28/2007   Method used:   Electronically sent to ...       [facility]       [place], [place]  [address]       Ph: [phone]       Fax: [phone]   RxID:   [id number] NORVASC 10 MG TABS (AMLODIPINE BESYLATE) Take 1 tablet by mouth once a day  #30 x 3   Entered and Authorized by:   Rico Sheehan DO   Signed by:   Rico Sheehan DO on 03/28/2007   Method used:   Electronically sent to ...       [facility]       [address]       [place], [place]  [address]       Ph: [phone]       Fax: [phone]   RxID:   [id number] PROTONIX 40 MG TBEC (PANTOPRAZOLE SODIUM) Take 1 tablet by mouth once a day  #30 x 3   Entered and Authorized by:   Rico Sheehan DO   Signed by:   Rico Sheehan DO on 03/28/2007   Method used:   Electronically sent to ...       [facility]       [address]       [place], [place]  [address]       Ph: [phone]       Fax: [phone]   RxID:   [id number] COLACE 100 MG CAPS (DOCUSATE SODIUM) Take 1 tablet by mouth at bedtime  #30 x 11   Entered and Authorized by:   Rico Sheehan DO   Signed by:   Rico Sheehan DO on 03/28/2007   Method used:   Electronically sent to ...       [facility]       [address]       [place], [place]  [address]       Ph: [phone]       Fax: [phone]   RxID:    [id number] ZOCOR 40 MG TABS (SIMVASTATIN) Take 1 tablet by mouth once a day at bedtime  #30 x 3   Entered and Authorized by:   Rico Sheehan DO   Signed by:   Rico Sheehan DO on 03/28/2007   Method used:   Electronically sent to ...       [facility]       [address]       [place], [place]  [address]       Ph: [phone]       Fax: [phone]   RxID:   [id number] LISINOPRIL 40 MG TABS (LISINOPRIL) Take 2  tablets by mouth once a day  #60 x 3   Entered and Authorized by:   Rico Sheehan DO   Signed by:   Rico Sheehan DO on 03/28/2007   Method used:   Electronically sent to ...       [facility]       [address]       [place], [place]  [address]       Ph: [phone]       Fax: [phone]   RxID:   [id number]  ]

## 2010-04-04 NOTE — Progress Notes (Signed)
----   Converted from flag ---- ---- 03/10/2007 3:21 PM, Enedina Finner wrote: pT WILL BE SEEN ON 03/27/2006 WITH dR. aGNEW  ---- 03/05/2007 4:33 PM, Morrison Old RN wrote: Needs an appt. before next med refill per Dr. Jiles Crocker.  Thanks ------------------------------

## 2010-04-04 NOTE — Progress Notes (Signed)
Summary: refill/ hla  Phone Note Refill Request Message from:  Fax from Pharmacy on January 16, 2010 2:11 PM  Refills Requested: Medication #1:  LISINOPRIL 40 MG TABS Take 1  tablet by mouth once a day   Dosage confirmed as above?Dosage Confirmed   Last Refilled: 10/5 last visit and labs 9/13  Initial call taken by: Freddy Finner RN,  January 16, 2010 2:11 PM  Follow-up for Phone Call        [facility] would like these to be 20mg , 2 once daily due to cost, could you redo the script to help the pt financially? thanks Follow-up by: Freddy Finner RN,  January 16, 2010 4:44 PM    New/Updated Medications: LISINOPRIL 20 MG TABS (LISINOPRIL) Take 2 tablet by mouth once a day Prescriptions: LISINOPRIL 20 MG TABS (LISINOPRIL) Take 2 tablet by mouth once a day  #60 x 3   Entered and Authorized by:   Vertell Limber MD   Signed by:   Vertell Limber MD on 01/16/2010   Method used:   Electronically to        [facility] (retail)       [address]       [place], [place]  [address]       Ph: [phone]       Fax: [phone]   RxID:   [id number] LISINOPRIL 40 MG TABS (LISINOPRIL) Take 1  tablet by mouth once a day  #30 x 2   Entered and Authorized by:   Vertell Limber MD   Signed by:   Vertell Limber MD on 01/16/2010   Method used:   Electronically to        [facility] (retail)       [address]       [place], [place]  [address]       Ph: [phone]       Fax: [phone]   RxID:   [id number]

## 2010-04-04 NOTE — Progress Notes (Signed)
Summary: Rx refill/dms  Phone Note Refill Request Message from:  Fax from Pharmacy on October 04, 2006 10:23 AM  Refills Requested: Medication #1:  ZOCOR 40 MG TABS Take 1 tablet by mouth once a day at bedtime   Last Refilled: 09/05/2006  Method Requested: Electronic Initial call taken by: Pollyann Samples,  October 04, 2006 10:24 AM  Follow-up for Phone Call        Refill completed electronically. Please have patient come in for CMET and fasting lipid panel (orders entered), and also schedule an appointment with patient's primary MD.  Follow-up by: Bertha Stakes MD,  October 04, 2006 10:40 AM  Additional Follow-up for Phone Call Additional follow up Details #1::        Sent electronically and flag sent to [facility] to schedule appt.  Additional Follow-up by: Pollyann Samples,  October 04, 2006 12:42 PM      Prescriptions: ZOCOR 40 MG TABS (SIMVASTATIN) Take 1 tablet by mouth once a day at bedtime  #30 x 0   Entered and Authorized by:   Bertha Stakes MD   Signed by:   Bertha Stakes MD on 10/04/2006   Method used:   Electronically sent to ...       [facility]       [place], [place]  [address]       Ph: [phone]       Fax: [phone]   RxID:   [id number]

## 2010-04-04 NOTE — Progress Notes (Signed)
Summary: Refill/gh  Phone Note Refill Request Message from:  Patient on August 04, 2007 9:00 AM  Refills Requested: Medication #1:  PROTONIX 40 MG TBEC Take 1 tablet by mouth once a day  Medication #2:  LISINOPRIL 40 MG TABS Take 2  tablets by mouth once a day  Medication #3:  CATAPRES 0.1 MG TABS Take 1 tablet by mouth two times a day  Method Requested: Electronic Initial call taken by: Sander Nephew RN,  August 04, 2007 9:01 AM  Follow-up for Phone Call        Done. Follow-up by: Rico Sheehan DO,  August 05, 2007 5:26 PM      Prescriptions: CATAPRES 0.1 MG TABS (CLONIDINE HCL) Take 1 tablet by mouth two times a day  #60 x 3   Entered and Authorized by:   Rico Sheehan DO   Signed by:   Rico Sheehan DO on 08/05/2007   Method used:   Electronically sent to ...       [facility]       [address]       [place], [place]  [address]       Ph: [phone]       Fax: [phone]   RxID:   [id number] PROTONIX 40 MG TBEC (PANTOPRAZOLE SODIUM) Take 1 tablet by mouth once a day  #30 x 3   Entered and Authorized by:   Rico Sheehan DO   Signed by:   Rico Sheehan DO on 08/05/2007   Method used:   Electronically sent to ...       [facility]       [address]       [place], [place]  [address]       Ph: [phone]       Fax: [phone]   RxID:   [id number] LISINOPRIL 40 MG TABS (LISINOPRIL) Take 2  tablets by mouth once a day  #60 x 3   Entered and Authorized by:   Rico Sheehan DO   Signed by:   Rico Sheehan DO on 08/05/2007   Method used:   Electronically sent to ...       [facility]       [address]       [place], [place]  [address]       Ph: [phone]       Fax: [phone]   RxID:   [id number]

## 2010-04-04 NOTE — Progress Notes (Signed)
Summary: med refill/gp  Phone Note Refill Request Message from:  Fax from Pharmacy on March 05, 2007 4:11 PM  Refills Requested: Medication #1:  ZOCOR 40 MG TABS Take 1 tablet by mouth once a day at bedtime   Last Refilled: 09/05/2006  Medication #2:  PROTONIX 40 MG TBEC Take 1 tablet by mouth once a day   Last Refilled: 01/29/2007 Initial call taken by: Morrison Old RN,  March 05, 2007 4:11 PM  Follow-up for Phone Call        Rx sent electronically with NO refills.  The pt needs to be seen before his next RF are due. Follow-up by: Rico Sheehan DO,  March 05, 2007 4:19 PM  Additional Follow-up for Phone Call Additional follow up Details #1::        Refills also faxed to pharmacy. Also flag sent to [facility] for an appt. Additional Follow-up by: Morrison Old RN,  March 05, 2007 4:34 PM      Prescriptions: PROTONIX 40 MG TBEC (PANTOPRAZOLE SODIUM) Take 1 tablet by mouth once a day  #30 x 0   Entered and Authorized by:   Rico Sheehan DO   Signed by:   Rico Sheehan DO on 03/05/2007   Method used:   Electronically sent to ...       [facility]       [address]       [place], [place]  [address]       Ph: [phone]       Fax: [phone]   RxID:   [id number] ZOCOR 40 MG TABS (SIMVASTATIN) Take 1 tablet by mouth once a day at bedtime  #30 x 0   Entered and Authorized by:   Rico Sheehan DO   Signed by:   Rico Sheehan DO on 03/05/2007   Method used:   Electronically sent to ...       [facility]       [address]       [place], [place]  [address]       Ph: [phone]       Fax: [phone]   RxID:   [id number]

## 2010-04-04 NOTE — Progress Notes (Signed)
Summary: med efill/gp  Phone Note Refill Request Message from:  Patient on October 21, 2007 4:07 PM  Refills Requested: Medication #1:  LISINOPRIL 40 MG TABS Take 2  tablets by mouth once a day  Medication #2:  HYDRALAZINE HCL 10 MG  TABS Take 1 tablet by mouth two times a day.  Medication #3:  PROTONIX 40 MG TBEC Take 1 tablet by mouth once a day  Medication #4:  NORVASC 10 MG TABS Take 1 tablet by mouth once a day  Method Requested: Electronic Initial call taken by: Morrison Old RN,  October 21, 2007 4:07 PM  Follow-up for Phone Call        Pt needs to be seen before any further RF will be granted beyond those given today.  Please schedule an appt for him to be seen within the next 1-2 months. Follow-up by: Rico Sheehan DO,  October 22, 2007 9:19 AM  Additional Follow-up for Phone Call Additional follow up Details #1::        Flag sent to [facility] to schedule an appt. Additional Follow-up by: Morrison Old RN,  October 22, 2007 12:14 PM      Prescriptions: HYDRALAZINE HCL 10 MG  TABS (HYDRALAZINE HCL) Take 1 tablet by mouth two times a day  #60 x 1   Entered and Authorized by:   Rico Sheehan DO   Signed by:   Rico Sheehan DO on 10/22/2007   Method used:   Electronically sent to ...       [facility]       [address]       [place], [place]  [address]       Ph: [phone]       Fax: [phone]   RxID:   [id number] NORVASC 10 MG TABS (AMLODIPINE BESYLATE) Take 1 tablet by mouth once a day  #30 x 1   Entered and Authorized by:   Rico Sheehan DO   Signed by:   Rico Sheehan DO on 10/22/2007   Method used:   Electronically sent to ...       [facility]       [address]       [place], [place]  [address]       Ph: [phone]       Fax: [phone]   RxID:   [id number] PROTONIX 40 MG TBEC (PANTOPRAZOLE SODIUM) Take 1 tablet by mouth once a day  #30 x 1   Entered and Authorized by:    Rico Sheehan DO   Signed by:   Rico Sheehan DO on 10/22/2007   Method used:   Electronically sent to ...       [facility]       [address]       [place], [place]  [address]       Ph: [phone]       Fax: [phone]   RxID:   [id number] LISINOPRIL 40 MG TABS (LISINOPRIL) Take 2  tablets by mouth once a day  #60 x 1   Entered and Authorized by:   Rico Sheehan DO   Signed by:   Rico Sheehan DO on 10/22/2007   Method used:   Electronically sent to ...       [facility]  [address]       [place], [place]  [address]       Ph: [phone]       Fax: [phone]   RxID:   [id number]

## 2010-04-04 NOTE — Progress Notes (Signed)
Summary: refill/gg     William Perkins  Phone Note Refill Request  on January 29, 2007 3:07 PM  Refills Requested: Medication #1:  NORVASC 10 MG TABS Take 1 tablet by mouth once a day   Last Refilled: 01/03/2007  Method Requested: electronic Initial call taken by: Gevena Cotton RN,  January 29, 2007 3:08 PM  Follow-up for Phone Call        RF electronically sent to pharmacy of record.  Pt needs an appt with me within the next 1-2 months. Follow-up by: Rico Sheehan DO,  January 29, 2007 3:22 PM      Prescriptions: NORVASC 10 MG TABS (AMLODIPINE BESYLATE) Take 1 tablet by mouth once a day  #31 x 11   Entered and Authorized by:   Rico Sheehan DO   Signed by:   Rico Sheehan DO on 01/29/2007   Method used:   Electronically sent to ...       [facility]       [place], [place]  [address]       Ph: [phone]       Fax: [phone]   RxID:   [id number]

## 2010-04-04 NOTE — Assessment & Plan Note (Signed)
Summary: ACUTE-MEDICATION REFILLS/CFB   Vital Signs:  Patient profile:   60 year old male Height:      71 inches (180.34 cm) Weight:      162.3 pounds (73.77 kg) BMI:     22.72 Temp:     98.2 degrees F (36.78 degrees C) oral Pulse rate:   67 / minute BP sitting:   126 / 78  (right arm)  Vitals Entered By: Morrison Old RN (April 28, 2009 3:32 PM) CC: f/u visit. Med. refills. Is Patient Diabetic? No Pain Assessment Patient in pain? no      Nutritional Status BMI of 19 -24 = normal  Have you ever been in a relationship where you felt threatened, hurt or afraid?No   Immunization History:  Influenza Immunization History:    Influenza:  historical (12/03/2008)   CC:  f/u visit. Med. refills..  History of Present Illness: [name] is a 60 y/o man with pmh significant for HLD, HTN and GERD who presents to the clinic today for a general check-up. Patient has brought all medications to the clinic today. Does not report any complaints or concerns.   Preventive Screening-Counseling & Management  Alcohol-Tobacco     Alcohol drinks/day: 0     Smoking Status: current     Smoking Cessation Counseling: yes     Packs/Day: 1     Year Started: 1967  Caffeine-Diet-Exercise     Does Patient Exercise: no  Current Medications (verified): 1)  Lisinopril 40 Mg Tabs (Lisinopril) .... Take 1  Tablet By Mouth Once A Day 2)  Lipitor 80 Mg Tabs (Atorvastatin Calcium) .... Take 1 Tablet By Mouth At Bedtime 3)  Omeprazole 40 Mg Cpdr (Omeprazole) .... Take 1 Tablet By Mouth Once A Day 4)  Norvasc 10 Mg Tabs (Amlodipine Besylate) .... Take 1 Tablet By Mouth Once A Day 5)  Furosemide 40 Mg  Tabs (Furosemide) .... Take 1 Tablet By Mouth Once A Day 6)  Catapres 0.1 Mg Tabs (Clonidine Hcl) .... Take 1 Tablet By Mouth Two Times A Day 7)  Hydralazine Hcl 10 Mg  Tabs (Hydralazine Hcl) .... Take 2 Tablet By Mouth Two Times A Day  Allergies (verified): No Known Drug Allergies  Past  History:  Past Medical History: Last updated: 08/02/2006 Left Ventricular Hypertrophy Hypertensive nephropathy (Microalb/cr ratio 704.5) Renal Insufficiency, CrCl < 50 ml/min Tobacco Abuse, 74 pk yr history    - failed chantix tx GERD Hyperlipidemia Hypertension, Hypertensive urgency, hospitalized 1/12-1/15/07 Constipation Periodontal disease  Social History: Last updated: 12/15/2008 Current Smoker, 2ppd x 30 years, now at 1 ppd after attempt at quitting Alcohol use-no, former heavy Drug use-no Occupation: Probation officer, runs [employer] Married, no kids.  Risk Factors: Alcohol Use: 0 (04/28/2009) Exercise: no (04/28/2009)  Risk Factors: Smoking Status: current (04/28/2009) Packs/Day: 1 (04/28/2009)  Review of Systems General:  Denies chills, fatigue, and fever. CV:  Denies chest pain or discomfort, difficulty breathing at night, difficulty breathing while lying down, shortness of breath with exertion, and swelling of feet. GI:  Denies abdominal pain, change in bowel habits, diarrhea, nausea, and vomiting. GU:  Denies dysuria, nocturia, urinary frequency, and urinary hesitancy. MS:  Denies joint pain.  Physical Exam  General:  alert and well-developed.   Head:  normocephalic and atraumatic.   Eyes:  vision grossly intact, pupils equal, pupils round, and pupils reactive to light.   Mouth:  pharynx pink and moist.   Neck:  supple, full ROM, and no masses.   Lungs:  normal respiratory effort, normal breath sounds, and no crackles.   Heart:  normal rate, regular rhythm, no murmur, no gallop, no rub, and no JVD.   Abdomen:  soft, non-tender, normal bowel sounds, no distention, and no masses.   Msk:  normal ROM.   Pulses:  R radial normal and L radial normal.   Extremities:  no LE edema noted  Neurologic:  alert & oriented X3 and cranial nerves II-XII intact.     Impression & Recommendations:  Problem # 1:  HYPERTENSION (ICD-401.9) Assessment Improved Blood pressure  is well controlled with current regimen. Will not make any further changes to regimen. Will check BMet to evaluate renal function and potassium.   His updated medication list for this problem includes:    Lisinopril 40 Mg Tabs (Lisinopril) .Marland Kitchen... Take 1  tablet by mouth once a day    Norvasc 10 Mg Tabs (Amlodipine besylate) .Marland Kitchen... Take 1 tablet by mouth once a day    Furosemide 40 Mg Tabs (Furosemide) .Marland Kitchen... Take 1 tablet by mouth once a day    Catapres 0.1 Mg Tabs (Clonidine hcl) .Marland Kitchen... Take 1 tablet by mouth two times a day    Hydralazine Hcl 10 Mg Tabs (Hydralazine hcl) .Marland Kitchen... Take 2 tablet by mouth two times a day  BP today: 126/78 Prior BP: 153/81 (12/15/2008)  Prior 10 Yr Risk Heart Disease: 27 % (12/15/2008)  Labs Reviewed: K+: 3.9 (12/15/2008) Creat: : 1.55 (12/15/2008)   Chol: 221 (07/15/2008)   HDL: 42 (07/15/2008)   LDL: 151 (07/15/2008)   TG: 141 (07/15/2008)  Problem # 2:  HYPERLIPIDEMIA (ICD-272.4) Assessment: Unchanged Lipds are above goal for patient. Will recheck lipid panel and LFTs today. Pt does not report muscle aches or pains, or any other side effects from lipitor.   His updated medication list for this problem includes:    Lipitor 80 Mg Tabs (Atorvastatin calcium) .Marland Kitchen... Take 1 tablet by mouth at bedtime  Orders: T-Lipid Profile [id number])  Labs Reviewed: SGOT: 17 (01/02/2008)   SGPT: 15 (01/02/2008)  Lipid Goals: LDL Goal: 100 (12/15/2008)     Prior 10 Yr Risk Heart Disease: 27 % (12/15/2008)   HDL:42 (07/15/2008), 43 (01/02/2008)  LDL:151 (07/15/2008), 111 (01/02/2008)  Chol:221 (07/15/2008), 184 (01/02/2008)  Trig:141 (07/15/2008), 152 (01/02/2008)  Problem # 3:  TOBACCO ABUSE (ICD-305.1) Assessment: Comment Only Patient is still smoking 2 PPD. I emphasized smoking cessation to the patient. The patient reports he is not ready to quit. Will readdress this issue at next clinic visit.   Problem # 4:  GERD (ICD-530.81) Assessment: Improved Patient  reports no new complaints regarding reflux and states PPI is helping his symptoms. Will continue with omeprazole.   His updated medication list for this problem includes:    Omeprazole 40 Mg Cpdr (Omeprazole) .Marland Kitchen... Take 1 tablet by mouth once a day  Complete Medication List: 1)  Lisinopril 40 Mg Tabs (Lisinopril) .... Take 1  tablet by mouth once a day 2)  Lipitor 80 Mg Tabs (Atorvastatin calcium) .... Take 1 tablet by mouth at bedtime 3)  Omeprazole 40 Mg Cpdr (Omeprazole) .... Take 1 tablet by mouth once a day 4)  Norvasc 10 Mg Tabs (Amlodipine besylate) .... Take 1 tablet by mouth once a day 5)  Furosemide 40 Mg Tabs (Furosemide) .... Take 1 tablet by mouth once a day 6)  Catapres 0.1 Mg Tabs (Clonidine hcl) .... Take 1 tablet by mouth two times a day 7)  Hydralazine Hcl 10 Mg Tabs (Hydralazine hcl) .Marland KitchenMarland KitchenMarland Kitchen  Take 2 tablet by mouth two times a day  Other Orders: T-Comprehensive Metabolic Panel ([id number])  Patient Instructions: 1)  Please schedule a follow-up appointment in 6 months. Prescriptions: HYDRALAZINE HCL 10 MG  TABS (HYDRALAZINE HCL) Take 2 tablet by mouth two times a day  #120 x 6   Entered and Authorized by:   Jolene Provost MD   Signed by:   Jolene Provost MD on 04/28/2009   Method used:   Electronically to        [facility] (retail)       [address]       [place], [place]  [address]       Ph: [phone]       Fax: [phone]   RxID:   [id number] CATAPRES 0.1 MG TABS (CLONIDINE HCL) Take 1 tablet by mouth two times a day  #60 x 6   Entered and Authorized by:   Jolene Provost MD   Signed by:   Jolene Provost MD on 04/28/2009   Method used:   Electronically to        [facility] (retail)       [address]       [place], [place]  [address]       Ph: [phone]       Fax: [phone]   [id number] FUROSEMIDE 40 MG  TABS (FUROSEMIDE) Take 1 tablet by mouth once a day  #30 x 6    Entered and Authorized by:   Jolene Provost MD   Signed by:   Jolene Provost MD on 04/28/2009   Method used:   Electronically to        [facility] (retail)       [address]       [place], [place]  [address]       Ph: [phone]       Fax: [phone]   [id number] NORVASC 10 MG TABS (AMLODIPINE BESYLATE) Take 1 tablet by mouth once a day  #30 x 6   Entered and Authorized by:   Jolene Provost MD   Signed by:   Jolene Provost MD on 04/28/2009   Method used:   Electronically to        [facility] (retail)       [address]       [place], [place]  [address]       Ph: [phone]       Fax: [phone]   [id number] OMEPRAZOLE 40 MG CPDR (OMEPRAZOLE) Take 1 tablet by mouth once a day  #30 x 6   Entered and Authorized by:   Jolene Provost MD   Signed by:   Jolene Provost MD on 04/28/2009   Method used:   Electronically to        [facility] (retail)       [place], [place]  [address]       Ph: [phone]       Fax: [phone]   RxID:   [id number] LIPITOR 80 MG TABS (ATORVASTATIN CALCIUM) Take 1 tablet by mouth at bedtime  #30 x  6   Entered and Authorized by:   Jolene Provost MD   Signed by:   Jolene Provost MD on 04/28/2009   Method used:   Electronically to        [facility] (retail)       [address]       [place], [place]  [address]       Ph: [phone]       Fax: [phone]   RxID:   [id number] LISINOPRIL 40 MG TABS (LISINOPRIL) Take 1  tablet by mouth once a day  #30 x 2   Entered and Authorized by:   Jolene Provost MD   Signed by:   Jolene Provost MD on 04/28/2009   Method used:   Electronically to        [facility] (retail)       [address]       [place], [place]  [address]       Ph: [phone]       Fax: [phone]   RxID:    [id number]   Prevention & Chronic Care Immunizations   Influenza vaccine: Historical  (12/03/2008)   Influenza vaccine deferral: Refused  (12/15/2008)    Tetanus booster: Not documented    Pneumococcal vaccine: Not documented  Colorectal Screening   Hemoccult: Not documented   Hemoccult action/deferral: Ordered  (12/15/2008)    Colonoscopy: Not documented   Colonoscopy action/deferral: Refused  (04/28/2009)  Other Screening   PSA: Not documented   Smoking status: current  (04/28/2009)   Smoking cessation counseling: yes  (04/28/2009)  Lipids   Total Cholesterol: 221  (07/15/2008)   Lipid panel action/deferral: Lipid Panel ordered   LDL: 151  (07/15/2008)   LDL Direct: Not documented   HDL: 42  (07/15/2008)   Triglycerides: 141  (07/15/2008)    SGOT (AST): 17  (01/02/2008)   BMP action: Ordered   SGPT (ALT): 15  (01/02/2008) CMP ordered    Alkaline phosphatase: 55  (01/02/2008)   Total bilirubin: 0.5  (01/02/2008)    Lipid flowsheet reviewed?: Yes   Progress toward LDL goal: Deteriorated  Hypertension   Last Blood Pressure: 126 / 78  (04/28/2009)   Serum creatinine: 1.55  (12/15/2008)   BMP action: Ordered   Serum potassium 3.9  (12/15/2008) CMP ordered     Hypertension flowsheet reviewed?: Yes   Progress toward BP goal: Improved  Self-Management Support :   Personal Goals (by the next clinic visit) :      Personal blood pressure goal: 140/90  (04/28/2009)     Personal LDL goal: 100  (04/28/2009)    Patient will work on the following items until the next clinic visit to reach self-care goals:     Medications and monitoring: take my medicines every day, check my blood pressure, bring all of my medications to every visit  (04/28/2009)     Eating: drink diet soda or water instead of juice or soda, eat more vegetables, use fresh or frozen vegetables, eat foods that are low in salt, eat baked foods instead of fried foods, eat fruit for snacks and  desserts, limit or avoid alcohol  (04/28/2009)     Activity: take a 30 minute walk every day  (12/15/2008)    Hypertension self-management support: Education handout, Resources for patients handout, Written self-care plan  (04/28/2009)   Hypertension self-care plan printed.  Hypertension education handout printed    Lipid self-management support: Education handout, Resources for patients handout, Written self-care plan  (04/28/2009)   Lipid self-care plan printed.   Lipid education handout printed      Resource handout printed.  Process Orders Check Orders Results:     [facility]: [id number] not required for this insurance Tests Sent for requisitioning (April 28, 2009 4:27 PM):     04/28/2009: [facility] -- T-Lipid Profile [id number] (signed)     04/28/2009: [facility] -- T-Comprehensive Metabolic Panel [id number] (signed)    Process Orders Check Orders Results:     [facility]: ABN not required for this insurance Tests Sent for requisitioning (April 28, 2009 4:27 PM):     04/28/2009: [facility] -- T-Lipid Profile [phone] (signed)     04/28/2009: [facility] -- T-Comprehensive Metabolic Panel [id number] (signed)

## 2010-04-04 NOTE — Progress Notes (Signed)
Summary: Refill/gh  Phone Note Refill Request Message from:  Patient on April 08, 2009 2:24 PM  Refills Requested: Medication #1:  LISINOPRIL 40 MG TABS Take 1  tablet by mouth once a day  Medication #2:  LIPITOR 80 MG TABS Take 1 tablet by mouth at bedtime  Medication #3:  OMEPRAZOLE 40 MG CPDR Take 1 tablet by mouth once a day  Medication #4:  NORVASC 10 MG TABS Take 1 tablet by mouth once a day Last visit and labs 03/17/2008.   Method Requested: Electronic Initial call taken by: Sander Nephew RN,  April 08, 2009 2:25 PM  Follow-up for Phone Call        Refilled electronically. Needs appointment, and needs liver panel and lipid panel at that appointment. Follow-up by: Bertha Stakes MD,  April 08, 2009 2:36 PM    Prescriptions: NORVASC 10 MG TABS (AMLODIPINE BESYLATE) Take 1 tablet by mouth once a day  #30 x 2   Entered and Authorized by:   Bertha Stakes MD   Signed by:   Bertha Stakes MD on 04/08/2009   Method used:   Electronically to        [facility] (retail)       [address]       [place], [place]  [address]       Ph: [phone]       Fax: [phone]   RxID:   [id number] OMEPRAZOLE 40 MG CPDR (OMEPRAZOLE) Take 1 tablet by mouth once a day  #30 x 2   Entered and Authorized by:   Bertha Stakes MD   Signed by:   Bertha Stakes MD on 04/08/2009   Method used:   Electronically to        [facility] (retail)       [place], [place]  [address]       Ph: [phone]       Fax: [phone]   RxID:   [id number] LIPITOR 80 MG TABS (ATORVASTATIN CALCIUM) Take 1 tablet by mouth at bedtime  #30 x 0   Entered and Authorized by:   Bertha Stakes MD   Signed by:   Bertha Stakes MD on 04/08/2009   Method used:   Electronically to        [facility] (retail)       [address]       [place], [place]  [address]       Ph: [phone]       Fax:  [phone]   [id number] LISINOPRIL 40 MG TABS (LISINOPRIL) Take 1  tablet by mouth once a day  #30 x 2   Entered and Authorized by:   Bertha Stakes MD   Signed by:   Bertha Stakes MD on 04/08/2009   Method used:   Electronically to        [facility] (retail)       [address]       [place], [place]  [address]       Ph: [phone]       Fax: [phone]   RxID:   [id number]
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
## 2010-04-04 NOTE — Progress Notes (Signed)
Summary: Refill/gh  Phone Note Refill Request Message from:  Fax from Pharmacy on April 08, 2009 2:27 PM  Refills Requested: Medication #1:  FUROSEMIDE 40 MG  TABS Take 1 tablet by mouth once a day  Medication #2:  CATAPRES 0.1 MG TABS Take 1 tablet by mouth two times a day  Medication #3:  HYDRALAZINE HCL 10 MG  TABS Take 2 tablet by mouth two times a day. Last refill 12/15/2008.   Method Requested: Electronic Initial call taken by: Sander Nephew RN,  April 08, 2009 2:27 PM  Follow-up for Phone Call        Refilled electronically.  Also, see other phone note today. Follow-up by: Bertha Stakes MD,  April 08, 2009 2:39 PM    Prescriptions: HYDRALAZINE HCL 10 MG  TABS (HYDRALAZINE HCL) Take 2 tablet by mouth two times a day  #120 x 2   Entered and Authorized by:   Bertha Stakes MD   Signed by:   Bertha Stakes MD on 04/08/2009   Method used:   Electronically to        [facility] [address] (retail)       [place], [place]  [address]       Ph: [phone]       Fax: [phone]   RxID:   [id number] CATAPRES 0.1 MG TABS (CLONIDINE HCL) Take 1 tablet by mouth two times a day  #60 x 2   Entered and Authorized by:   Bertha Stakes MD   Signed by:   Bertha Stakes MD on 04/08/2009   Method used:   Electronically to        [facility] (retail)       [place], [place]  [address]       Ph: [phone]       Fax: [phone]   RxID:   [id number] FUROSEMIDE 40 MG  TABS (FUROSEMIDE) Take 1 tablet by mouth once a day  #30 x 2   Entered and Authorized by:   Bertha Stakes MD   Signed by:   Bertha Stakes MD on 04/08/2009   Method used:   Electronically to        [facility] (retail)       [address]       [place], [place]  [address]       Ph: [phone]       Fax: [phone]   RxID:   [id number]

## 2010-04-04 NOTE — Progress Notes (Signed)
Summary: REFILL/GG  Phone Note Refill Request  on September 20, 2006 11:14 AM  Refills Requested: Medication #1:  PROTONIX 40 MG TBEC Take 1 tablet by mouth once a day   Last Refilled: 08/22/2006 Initial call taken by: Gevena Cotton RN,  September 20, 2006 11:14 AM  Follow-up for Phone Call        Refill approved-nurse to complete Follow-up by: Lucy Chris MD,  September 20, 2006 2:16 PM  Additional Follow-up for Phone Call Additional follow up Details #1::        Rx faxed to pharmacy Additional Follow-up by: Gevena Cotton RN,  September 20, 2006 2:26 PM     Prescriptions: PROTONIX 40 MG TBEC (PANTOPRAZOLE SODIUM) Take 1 tablet by mouth once a day  #31 x 5   Entered and Authorized by:   Lucy Chris MD   Signed by:   Lucy Chris MD on 09/20/2006   Method used:   Telephoned to ...       [facility] [address]       [place], [place]  [address]       Ph: [phone]       Fax: [phone]   RxID:   [id number]

## 2010-04-04 NOTE — Assessment & Plan Note (Signed)
Summary: 37MONTH RECK BP/EST/VS   Vital Signs:  Patient Profile:   60 Years Old Male Height:     71 inches (180.34 cm) Weight:      167.04 pounds (75.93 kg) BMI:     23.38 Temp:     98.2 degrees F (36.78 degrees C) oral Pulse rate:   72 / minute BP sitting:   130 / 75  (right arm)  Pt. in pain?   no  Vitals Entered By: Sander Nephew RN (May 08, 2007 2:57 PM)              Is Patient Diabetic? No Nutritional Status BMI of 19 -24 = normal  Have you ever been in a relationship where you felt threatened, hurt or afraid?No   Does patient need assistance? Functional Status Self care Ambulation Normal     History of Present Illness: 59 yo M here for a BP check.  He's been checking it at home about once a week and it runs about 123XX123 systolic.  No problems taking the new med.  No complaints.  He has not been taking the Zocor because the pharmacy doesn't ever give it to him when he goes for RF.    Updated Prior Medication List: LISINOPRIL 40 MG TABS (LISINOPRIL) Take 2  tablets by mouth once a day ZOCOR 40 MG TABS (SIMVASTATIN) Take 1 tablet by mouth once a day at bedtime COLACE 100 MG CAPS (DOCUSATE SODIUM) Take 1 tablet by mouth at bedtime PROTONIX 40 MG TBEC (PANTOPRAZOLE SODIUM) Take 1 tablet by mouth once a day NORVASC 10 MG TABS (AMLODIPINE BESYLATE) Take 1 tablet by mouth once a day FUROSEMIDE 40 MG  TABS (FUROSEMIDE) Take 1 tablet by mouth once a day CATAPRES 0.1 MG TABS (CLONIDINE HCL) Take 1 tablet by mouth two times a day NICOTINE 21-14-7 MG/24HR  KIT (NICOTINE) Place a 21mg  patch on the skin of your arm once a day, removing the old patch before placing a new one.  Use each dose (21, 14 or 7) for 2 weeks, then stop. VIAGRA 50 MG  TABS (SILDENAFIL CITRATE) Take 1/2 a tablet 0.5 to 4 hours before intercourse, if no results, take the other 1/2 for a maximum of 50mg /day.  Max dose is 50mg /day. HYDRALAZINE HCL 10 MG  TABS (HYDRALAZINE HCL) Take 1 tablet by mouth two times a  day  Current Allergies (reviewed today): No known allergies   Past Medical History:    Reviewed history from 08/02/2006 and no changes required:       Left Ventricular Hypertrophy       Hypertensive nephropathy (Microalb/cr ratio 704.5)       Renal Insufficiency, CrCl < 50 ml/min       Tobacco Abuse, 74 pk yr history          - failed chantix tx       GERD       Hyperlipidemia       Hypertension, Hypertensive urgency, hospitalized 1/12-1/15/07       Constipation       Periodontal disease   Social History:    Reviewed history from 03/22/2006 and no changes required:       Current Smoker, 2ppd x 30 years       Alcohol use-no, former heavy       Drug use-no       Occupation: Editor, commissioning   Risk Factors:  Tobacco use:  current    Year started:  1967  Cigarettes:  Yes -- 1 pack(s) per day    Counseled to quit/cut down tobacco use:  yes Drug use:  no Alcohol use:  no Exercise:  no Seatbelt use:  100 %   Review of Systems      See HPI   Physical Exam  General:     Well-developed,well-nourished,in no acute distress; alert,appropriate and cooperative throughout examination Eyes:     EOMI, anicteric, eyelids somewhat puffy and dry Mouth:     mmm Neck:     supple and full ROM.   Lungs:     Clear, but prolonged exp phase Heart:     2/6 SEM best heard lower left sternal border, normal rate and regular rhythm.   Abdomen:     soft, non-tender, and normal bowel sounds.   Pulses:     R radial normal and L radial normal.   Extremities:     No edema Neurologic:     alert & oriented X3.   Skin:     Intact without suspicious lesions or rashes Psych:     Appropriate    Impression & Recommendations:  Problem # 1:  HYPERTENSION (ICD-401.9) BP finally at goal!  Since BP now at goal, will refer back to Dr. Percival Spanish for cardiac stress testing as per his last note.  Will have pt RTC in 3 months to see me for a checkup and if his BP is still doing well, will let  him go for another 6 months.  No labs checked today. His updated medication list for this problem includes:    Lisinopril 40 Mg Tabs (Lisinopril) .Marland Kitchen... Take 2  tablets by mouth once a day    Norvasc 10 Mg Tabs (Amlodipine besylate) .Marland Kitchen... Take 1 tablet by mouth once a day    Furosemide 40 Mg Tabs (Furosemide) .Marland Kitchen... Take 1 tablet by mouth once a day    Catapres 0.1 Mg Tabs (Clonidine hcl) .Marland Kitchen... Take 1 tablet by mouth two times a day    Hydralazine Hcl 10 Mg Tabs (Hydralazine hcl) .Marland Kitchen... Take 1 tablet by mouth two times a day  Orders: Cardiology Referral (Cardiology)  BP today: 130/75 Prior BP: 164/83 (04/11/2007)  Labs Reviewed: Creat: 1.78 (04/11/2007) Chol: 194 (04/11/2007)   HDL: 45 (04/11/2007)   LDL: 116 (04/11/2007)   TG: 166 (04/11/2007)   Problem # 2:  HYPERLIPIDEMIA (ICD-272.4) FLP reviewed with the pt.  Pt was not taking the Zocor due to some delay at the pharmacy.  I have given him a written RX today to carry over to the pharmacy to make sure he can get it filled.  Will check a repeat FLP in 3-6 months.  Last LFTs were WNL. His updated medication list for this problem includes:    Zocor 40 Mg Tabs (Simvastatin) .Marland Kitchen... Take 1 tablet by mouth once a day at bedtime  Labs Reviewed: Chol: 194 (04/11/2007)   HDL: 45 (04/11/2007)   LDL: 116 (04/11/2007)   TG: 166 (04/11/2007) SGOT: 19 (03/28/2007)   SGPT: 12 (03/28/2007)   Problem # 3:  Preventive Health Care (ICD-V70.0) Pt was sent home with a new set of stool cards since he did not do them correctly last time.  He has a GI referral for a screening colonoscopy still pending.    Complete Medication List: 1)  Lisinopril 40 Mg Tabs (Lisinopril) .... Take 2  tablets by mouth once a day 2)  Zocor 40 Mg Tabs (Simvastatin) .... Take 1 tablet by mouth once a day at  bedtime 3)  Colace 100 Mg Caps (Docusate sodium) .... Take 1 tablet by mouth at bedtime 4)  Protonix 40 Mg Tbec (Pantoprazole sodium) .... Take 1 tablet by mouth once a  day 5)  Norvasc 10 Mg Tabs (Amlodipine besylate) .... Take 1 tablet by mouth once a day 6)  Furosemide 40 Mg Tabs (Furosemide) .... Take 1 tablet by mouth once a day 7)  Catapres 0.1 Mg Tabs (Clonidine hcl) .... Take 1 tablet by mouth two times a day 8)  Nicotine 21-14-7 Mg/24hr Kit (Nicotine) .... Place a 21mg  patch on the skin of your arm once a day, removing the old patch before placing a new one.  use each dose (21, 14 or 7) for 2 weeks, then stop. 9)  Viagra 50 Mg Tabs (Sildenafil citrate) .... Take 1/2 a tablet 0.5 to 4 hours before intercourse, if no results, take the other 1/2 for a maximum of 50mg /day.  max dose is 50mg /day. 10)  Hydralazine Hcl 10 Mg Tabs (Hydralazine hcl) .... Take 1 tablet by mouth two times a day   Patient Instructions: 1)  Please schedule a follow-up appointment in 3 months with Dr. Jiles Crocker. 2)  Please mail back the stool cards as soon as you can - use a sample from 3 different stools on 3 different days within a week of each other (the address is [address] [place], [place] [address]). 3)  You will be called with an appointment to see Dr. Percival Spanish about a stress test, but if you don't hear from [place] in the next 2 weeks, call us back. 4)  Fill the prescription for the Zocor and re-start it.    Prescriptions: ZOCOR 40 MG TABS (SIMVASTATIN) Take 1 tablet by mouth once a day at bedtime  #30 x 11   Entered and Authorized by:   Rico Sheehan DO   Signed by:   Rico Sheehan DO on 05/08/2007   Method used:   Print then Give to Patient   RxID:   [id number]  ]

## 2010-04-04 NOTE — Miscellaneous (Signed)
Summary: HIPAA Restrictions  HIPAA Restrictions   Imported By: Bonner Puna 12/18/2007 15:29:52  _____________________________________________________________________  External Attachment:    Type:   Image     Comment:   External Document

## 2010-04-04 NOTE — Progress Notes (Signed)
Summary: Needs to be seen 2/2 elevated BP  Phone Note From Other Clinic   Caller: Adley Castello Reason for Call: Medication Check Summary of Call: Dr. Percival Spanish emailed me and suggested that I add aldactone to the pt's BP regimen since the pt's BP was so elevated at his most recent visit with Dr. Percival Spanish last week.  The pt needs to be called for an appointment to be seen and started on aldactone.  He needs a BMET the day of his visit and a week after starting the aldactone with regular follow up appts for BMETs to check his K on the aldactone.  Try to fit the pt in with me, but if not possible, he needs to be seen in the next 2 weeks by whomever is available. Initial call taken by: Rico Sheehan D.O.,  May 10, 2006 5:30 PM  Follow-up for Phone Call        Message received and Dr Prudencio Burly aware. Follow-up by: Gevena Cotton RN,  May 13, 2006 9:02 AM

## 2010-04-04 NOTE — Assessment & Plan Note (Signed)
Summary: HFU/ NP./ CBC/ BMET/BP. / PER DR. YOUNG/ SB.   Vital Signs:  Patient Profile:   60 Years Old Male Weight:      159.01 pounds Temp:     96.7 degrees F oral Pulse rate:   56 / minute BP sitting:   196 / 104  (right arm)  Pt. in pain?   no  Vitals Entered By: Sander Nephew RN (March 22, 2006 1:45 PM)              Is Patient Diabetic? No Nutritional Status Normal  Does patient need assistance? Functional Status Self care Ambulation Normal      Chief Complaint:  HFU.  History of Present Illness: William Perkins is a 60 yr old man with a PMH of htn who presents today for HFU.  He was hospitalized for hypertensive urgency with associated dizziness.  He states that his dizziness is much better than the day he was admitted, but is still present.  He gets dizzy when he goes from a seated to standing position, but if he remembers to take his time when he stands up, he doesn't get dizzy.  He's been taking all of his medications since he's been discharged.  He wants help to quit smoking.  He's also been having some constipation.  He hasn't had a BM in 9 days and is a little uncomfortable.  He's taking Colace, but it's not helping him.  As a child, he reports, he had a problem with constipation, but it's not bothered him until recently.  He was constipated before he went into the hospital.  Otherwise, he's doing well.  He does not know of any cardiology followup, nor was he told of any cardiology appointment.    Prior Medications: LISINOPRIL 20 MG TABS (LISINOPRIL) Take 1 tablet by mouth once a day ZOCOR 40 MG TABS (SIMVASTATIN) Take 1 tablet by mouth once a day at bedtime COLACE 100 MG CAPS (DOCUSATE SODIUM) Take 1 tablet by mouth two times a day PROTONIX 40 MG TBEC (PANTOPRAZOLE SODIUM) Take 1 tablet by mouth once a day Current Allergies: No known allergies   Past Medical History:    Left Ventricular Hypertrophy    Hypertensive nephropathy (Microalb/cr ratio 704.5)    Renal  Insufficiency, CrCl < 50 ml/min    Tobacco Abuse, 74 pk yr history    GERD    Hyperlipidemia    Hypertension, Hypertensive urgency, hospitalized 1/12-1/15/07    Constipation       Social History:    Current Smoker, 2ppd x 30 years    Alcohol use-no, former heavy    Drug use-no    Occupation: Editor, commissioning   Risk Factors:  Tobacco use:  current    Cigarettes:  Yes -- 2 pack(s) per day    Counseled to quit/cut down tobacco use:  yes Drug use:  no Alcohol use:  no   Review of Systems       The patient complains of constipation.  The patient denies fever, chills, fatigue, nausea, vomiting, diarrhea, abdominal pain, melena, hematochezia, and dysuria.     Physical Exam  General:     Well-developed,well-nourished,in no acute distress; alert,appropriate and cooperative throughout examination Eyes:     EOMI, PERRL, anicteric Mouth:     OP clear Neck:     supple, full ROM, no masses, and no thyromegaly.   Lungs:     CTA B/L with prolonged expiratory phase Heart:     normal rate, regular rhythm,  and no murmur.   Abdomen:     soft, non-tender, no distention, and bowel sounds hypoactive.  Stool palpable in the colon.   Extremities:     No C/C/E. Neurologic:     alert & oriented X3.   Cervical Nodes:     no anterior cervical adenopathy and no posterior cervical adenopathy.   Axillary Nodes:     no R axillary adenopathy and no L axillary adenopathy.      Impression & Recommendations:  Problem # 1:  HYPERTENSION (ICD-401.9) The pt was on HCTZ, Clonidine and Lisinopril at discharge and his BP was 166/44.  Today his BP is very elevated at 196/104, although the pt is asx.  Because he had some renal insufficiency that worsened while in the hospital, I felt it would be best (also, after speaking with Dr. Derrel Nip) that the HCTZ should be discontinued in favor of keeping the ACE-I to help with his RI.  Also, he's been bradycardic ever since the Clonidine was started, so I have  discontinued that as well in favor of starting him on Norvasc at 5mg  daily to hopefully bring his BP into a more acceptable range.  He will RTC in 1-2 weeks for a BP check and a repeat BMET to make sure his kidneys are tolerating the ACE-I.  There is a BMET pending from today.  He is also to see the [facility] cardiology group for an outpt Myoview to r/o ischemia, as noted in the hospital chart, and this will be arranged for the pt today as well.  A UDS will be obtained today as well, for completeness, given the very elevated BP on the same regimen he was on inpt where his BP was fairly well controlled.  A final consideration will need to be whether or not the pt has renal artery stenosis and this will be addressed at our next visit.   The following medications were removed from the medication list:    Catapres 0.2 Mg Tabs (Clonidine hcl) .Marland Kitchen... Take 1 tablet by mouth two times a day    Norvasc 2.5 Mg Tabs (Amlodipine besylate) .Marland Kitchen... Take 1 tablet by mouth once a day  His updated medication list for this problem includes:    Lisinopril 20 Mg Tabs (Lisinopril) .Marland Kitchen... Take 1 tablet by mouth once a day    Norvasc 5 Mg Tabs (Amlodipine besylate) .Marland Kitchen... Take 1 tablet by mouth once a day  Orders: Cardiology Referral (Cardiology) T-Drug Screen-Urine, (single) ([id number]) T-CBC No Diff (123456) T-Basic Metabolic Panel (99991111)   Problem # 2:  RENAL INSUFFICIENCY, CHRONIC (ICD-585.9) It was noted when the pt was admitted that his Cr was 1.7 and increased to 2.0 after being placed on HCTZ and Lisinopril.  I have d/c'd the HCTZ today in hopes of being able to continue the ACE-I for it's renally-protective effects.  The question was raised as to whether or not the pt has RAS and this will be further addressed at our next visit.  He had an abd [place] while inpt that revealed upper limits of normal renal echogenicity with the R kidney 9.8cm and L kidney 10cm as well as no hydronephrosis.  There is a BMET  pending from today to f/u on his Cr.  Will check another BMET when he returns in 1-2 weeks.  He will likely need a nephrology referral in the near future.  Problem # 3:  TOBACCO ABUSE (ICD-305.1) The pt was very interested in quitting cigarettes and asked for help with this  today.  He has tried the patch in the past with no success, so I've given him a RX for Chantix with instructions for how to use it.  This will be followed at each visit.   His updated medication list for this problem includes:    Chantix Starting Month Pak 0.5 Mg X 11 & 1 Mg X 42 Misc (Varenicline tartrate) .Marland Kitchen... Take 0.5mg  daily days 1-3, then 0.5mg  twice a day days 4-7, the 1mg  once a day thereafter for 11 weeks   Problem # 4:  CONSTIPATION NOS (ICD-564.00) I advised the pt to continue taking the Colace and also told him he can use an over the counter laxative such as Dulcolax or ExLax as directed to help with his constipation.  I also advised him to call the clinic if the OTC medications weren't working and we would call in a RX for something stronger (such as Lactulose 30cc by mouth q2h as needed until BM). His updated medication list for this problem includes:    Colace 100 Mg Caps (Docusate sodium) .Marland Kitchen... Take 1 tablet by mouth two times a day   Medications Added to Medication List This Visit: 1)  Colace 100 Mg Caps (Docusate sodium) .... Take 1 tablet by mouth two times a day 2)  Protonix 40 Mg Tbec (Pantoprazole sodium) .... Take 1 tablet by mouth once a day 3)  Norvasc 5 Mg Tabs (Amlodipine besylate) .... Take 1 tablet by mouth once a day 4)  Chantix Starting Month Pak 0.5 Mg X 11 & 1 Mg X 42 Misc (Varenicline tartrate) .... Take 0.5mg  daily days 1-3, then 0.5mg  twice a day days 4-7, the 1mg  once a day thereafter for 11 weeks   Patient Instructions: 1)  Please schedule a follow-up appointment in 1-2 weeks.  2)  Stop taking the Clonidine. 3)  Stop taking the Hydrochlorothiazide (=HCTZ). 4)  Start taking the Norvasc  5mg  tablets once a day and continue all your other medications as before. 5)  Pick a quit date and start the Chantix one week before that date. 6)  You will be called with your appointment with the [facility] cardiologist. 7)  Use over the counter ExLax or Dulcolax as directed for constipation.

## 2010-04-04 NOTE — Assessment & Plan Note (Signed)
Summary: BP CK/BMET/VS   Vital Signs:  Patient Profile:   60 Years Old Male Weight:      162.0 pounds (73.64 kg) Temp:     97.3 degrees F (36.28 degrees C) oral Pulse rate:   59 / minute BP sitting:   185 / 84  Pt. in pain?   no  Vitals Entered By: [name] RN (May 10, 2006 3:52 PM)              Is Patient Diabetic? No Nutritional Status Normal Nutritional Status Detail good  Have you ever been in a relationship where you felt threatened, hurt or afraid?denies   Does patient need assistance? Functional Status Self care Ambulation Normal   Chief Complaint:  FU BP and labs.  History of Present Illness: [name] is a 60 y.o. AAM followed in [facility] for HTN, CRI, hyperlipidemia, and tobacco abuse among other things who presents today to follow up on his persistently elevated blood pressure.  In general William Perkins feels pretty well.  He has has no headaches, fatigue, changes in vision, nor any other active complaints.  He reports that he's had no difficulty taking his medications, and that he has had no side effects.  He checks his blood pressure at home approx 2-3 times each day, and reports that it is consistently elevated  0000000 systolic (he actually reports that it's been this high for "years.").  Prior Medications: LISINOPRIL 40 MG TABS (LISINOPRIL) Take 1 tablet by mouth once a day ZOCOR 40 MG TABS (SIMVASTATIN) Take 1 tablet by mouth once a day at bedtime COLACE 100 MG CAPS (DOCUSATE SODIUM) Take 1 tablet by mouth two times a day PROTONIX 40 MG TBEC (PANTOPRAZOLE SODIUM) Take 1 tablet by mouth once a day NORVASC 10 MG TABS (AMLODIPINE BESYLATE) Take 1 tablet by mouth once a day CHANTIX STARTING MONTH PAK 0.5 MG X 11 & 1 MG X 42 MISC (VARENICLINE TARTRATE) Take 0.5mg  daily days 1-3, then 0.5mg  twice a day days 4-7, the 1mg  once a day thereafter for 11 weeks LASIX 20 MG TABS (FUROSEMIDE) Take 1 tablet by mouth once a day Current Allergies: No known allergies     Risk  Factors: Tobacco use:  current    Cigarettes:  Yes -- 1 pack(s) per day Drug use:  no Alcohol use:  no   Review of Systems  General      Denies fatigue, fever, and malaise.  Eyes      Denies blurring and vision loss-both eyes.  CV      Denies chest pain or discomfort, difficulty breathing while lying down, and lightheadness.  Resp      Denies cough and shortness of breath.  GI      Denies abdominal pain, nausea, and vomiting.  Neuro      Denies disturbances in coordination, headaches, and numbness.   Physical Exam  General:     alert, well-developed, well-nourished, well-hydrated, appropriate dress, and normal appearance.   Head:     normocephalic and atraumatic.   Eyes:     vision grossly intact, pupils equal, pupils round, and pupils reactive to light.   Nose:     no external deformity.   Mouth:     poor dentition and teeth missing; severe tobacco staining. Lungs:     normal respiratory effort, no accessory muscle use, normal breath sounds, no crackles, and no wheezes.   Heart:     normal rate, regular rhythm, no murmur, and no  gallop.   Abdomen:     soft, non-tender, normal bowel sounds, no distention, and no masses.   Msk:     normal ROM, no joint tenderness, no joint swelling, and no joint warmth.   Neurologic:     alert & oriented X3, cranial nerves II-XII intact, and gait normal.   Skin:     turgor normal and color normal.   Cervical Nodes:     no anterior cervical adenopathy.   Psych:     Oriented X3, memory intact for recent and remote, normally interactive, good eye contact, and slightly anxious.      Impression & Recommendations:  Problem # 1:  HYPERTENSION (ICD-401.9) By my recheck with manual cuff, his BP is 180/80.   His BP has been persistently elevated despite several regimen changes over the past several weeks including increasing Norvasc to 10mg  daily and adding Lasix 20mg  daily.  I discussed the patient with Dr. Selinda Flavin, and the plan will  be to double lisinopril to 40mg  today.  We will check another bmet in 2 wks (see below re K+).  I will ask him to return for a f/u app in 1 month for another BP check.  At that point if his BP is still elevated the options would be to go up on lisinopril (max is 80mg /day).  He is maxed on Norvasc.   We still have some room to go up on his lasix vs adding spironolactone.  His updated medication list for this problem includes:    Lisinopril 40 Mg Tabs (Lisinopril) .Marland Kitchen... Take 1 tablet by mouth once a day    Norvasc 10 Mg Tabs (Amlodipine besylate) .Marland Kitchen... Take 1 tablet by mouth once a day    Lasix 20 Mg Tabs (Furosemide) .Marland Kitchen... Take 1 tablet by mouth once a day  Future Orders: T-Basic Metabolic Panel (99991111) ... 05/24/2006   Problem # 2:  RENAL INSUFFICIENCY, CHRONIC (ICD-585.9) His Cr has been elevated but essentially stable 2.09 -> 1.82 over the past two BMETs.  Today on our stat BMET it is again 1.82.  We will continue to follow this; BMET slated for 2 wks. Future Orders: T-Basic Metabolic Panel (99991111) ... 05/24/2006   Problem # 3:  HYPOKALEMIA, MILD (ICD-276.8) Potassium is somewhat low today at 3.3.  I'm ok with this for now, but I certainly want to follow it closesly  Since we just doubled the level of his ACE inhibitor today, I'm somewhat hesitant to either add K or add K sparing diuretic today 2/2 concern of hyperkalemia.  We will see him in 2 wks for f/u BMET.  If K is still low or lower after 2 wks on the higher dose of his ACE, certainly repleting him orally or switching him to maxzide will be called for.    Problem # 4:  TOBACCO ABUSE (ICD-305.1) Spoke to him today about his efforts to quit.  At best it is probably plus minus.  After speaking with him, it wasn't clear he fully understood the use of chantix (ie, picking/setting a quit date).  I've stressed that he should pick a  His updated medication list for this problem includes:    Chantix Starting Month Pak 0.5 Mg X 11 &  1 Mg X 42 Misc (Varenicline tartrate) .Marland Kitchen... Take 0.5mg  daily days 1-3, then 0.5mg  twice a day days 4-7, the 1mg  once a day thereafter for 11 weeks   Medications Added to Medication List This Visit: 1)  Lisinopril 40 Mg Tabs (Lisinopril) .Marland KitchenMarland KitchenMarland Kitchen  Take 1 tablet by mouth once a day Prescriptions: LISINOPRIL 40 MG TABS (LISINOPRIL) Take 1 tablet by mouth once a day  #30 x 11   Entered and Authorized by:   Katy Apo MD   Signed by:   Katy Apo MD on 05/10/2006   Method used:   Print then Give to Patient   RxIDBX:[id number]    Patient Instructions: 1)  Please take Lisinopril 20mg  2 TABS each day. 2)  I've given you a refill for Lisinopril 40mg  1 tab each day once your bottle of 20mg  pills runs out. 3)  Please come in for lab work in 2 weeks. 4)  Please schedule a follow-up appointment in 1 month. 5)  I will call you today if any of your blood work is not normal.  Appended Document: Orders Update    Clinical Lists Changes  Orders: Added new Service order of Est. Patient Level IV [id number]) - Signed

## 2010-04-04 NOTE — Letter (Signed)
Summary: Handout Printed  Printed Handout:  - *Patient Instructions 

## 2010-04-04 NOTE — Letter (Signed)
Summary: [facility]   Imported By: Bonner Puna 01/18/2009 14:50:45  _____________________________________________________________________  External Attachment:    Type:   Image     Comment:   External Document  Appended Document: [facility] Noted - dorsal ganglion cyst drained by orthopedics. We appreciate their help with this problem.

## 2010-04-04 NOTE — Progress Notes (Signed)
Summary: Screening colonoscopy referral  Phone Note Outgoing Call   Call placed by: Pollyann Samples,  April 11, 2006 9:33 AM Call placed to: Fax to Provider Summary of Call:       Faxed screening colonoscopy referral form, most recent office visit, and recent lab results to [facility].  Follow-up for Phone Call        Called pt to give appt information for scrrening colonoscopy initial visit and examination with Dr. Collene Mares. Left a msg on his answering machine with all of the information. Follow-up by: Pollyann Samples,  April 11, 2006 9:38 AM

## 2010-04-04 NOTE — Progress Notes (Signed)
Summary: Refill/gh  Phone Note Refill Request Message from:  Pharmacy on January 16, 2007 8:29 AM  Refills Requested: Medication #1:  LISINOPRIL 40 MG TABS Take 2  tablets by mouth once a day   Last Refilled: 12/04/2006  Method Requested: Electronic Initial call taken by: Sander Nephew RN,  January 16, 2007 8:30 AM  Follow-up for Phone Call        Will RF x 1, but pt needs an appt, hasn't been seen since 4/08.  RX sent electronically. Follow-up by: Rico Sheehan DO,  January 16, 2007 8:42 PM  Additional Follow-up for Phone Call Additional follow up Details #1::        Pt to be called to schedule appointment. Additional Follow-up by: Sander Nephew RN,  January 17, 2007 10:09 AM      Prescriptions: LISINOPRIL 40 MG TABS (LISINOPRIL) Take 2  tablets by mouth once a day  #60 x 0   Entered by:   Rico Sheehan DO   Authorized by:   Sander Nephew RN   Signed by:   Rico Sheehan DO on 01/16/2007   Method used:   Electronically sent to ...       [facility]       [address]       [place], [place]  [address]       Ph: [phone]       Fax: [phone]   RxID:   [id number]

## 2010-04-04 NOTE — Assessment & Plan Note (Signed)
Summary: EST-CK/FU/MEDS/CFB   Vital Signs:  Patient profile:   60 year old male Height:      71 inches (180.34 cm) Weight:      162.2 pounds (73.73 kg) BMI:     22.70 Temp:     97.1 degrees F (36.17 degrees C) oral Pulse rate:   59 / minute BP sitting:   122 / 74  (right arm) Cuff size:   regular  Vitals Entered By: Nadine Counts Deborra Medina) (Jul 15, 2008 8:46 AM) CC: med refill, f/u htn Is Patient Diabetic? No Pain Assessment Patient in pain? no      Nutritional Status BMI of 19 -24 = normal  Does patient need assistance? Functional Status Self care Ambulation Normal   CC:  med refill and f/u htn.  History of Present Illness: 60 yo M who presents for refills and a checkup.  He is feeling well.  Has been taking his meds everyday and checks his BP at home and it's normally SBP 101-102.  He usually feels kind of bad when he first takes his meds for about 1-2 hours, it makes him lose his energy, but then he feels totally normal afterwards.  He also has to urinate a lot initially. He has been having pain in his right lower back on occasion that started recntly.  He takes Tylenol when he has the pain and it makes the pain go away.  He needs to take the Tylenol about once a week.  No weakness, numbness or tingling a/w the pain.  He did not injure himself.  He works at [employer] with [employer].  No heavy lifting, but is on his feet all day.  He tried changing his shoes, but this didn't help.  Resting makes the pain better and standing for long periods of time makes it worse.  He denies any F/C/N/V or urinary complaints associated with this pain.   Still smokes, but is interested in quitting.  Does not want to use the patch - too expensive.   Denies any change in vision, HA, CP, SOB, N/V/D/C, abd pain, change in bowel or bladder habits, or blood from any orifice.   Preventive Screening-Counseling & Management     Smoking Status: current     Smoking Cessation Counseling:  yes     Packs/Day: 1     Year Started: 1967     Does Patient Exercise: no  Current Medications (verified): 1)  Lisinopril 40 Mg Tabs (Lisinopril) .... Take 2  Tablets By Mouth Once A Day 2)  Zocor 40 Mg Tabs (Simvastatin) .... Take 1 Tablet By Mouth Once A Day At Bedtime 3)  Protonix 40 Mg Tbec (Pantoprazole Sodium) .... Take 1 Tablet By Mouth Once A Day 4)  Norvasc 10 Mg Tabs (Amlodipine Besylate) .... Take 1 Tablet By Mouth Once A Day 5)  Furosemide 40 Mg  Tabs (Furosemide) .... Take 1 Tablet By Mouth Once A Day 6)  Catapres 0.1 Mg Tabs (Clonidine Hcl) .... Take 1 Tablet By Mouth Two Times A Day 7)  Hydralazine Hcl 10 Mg  Tabs (Hydralazine Hcl) .... 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   Lungs:  normal respiratory effort, no intercostal  retractions, no accessory muscle use, normal breath sounds, no crackles, and no wheezes.   Heart:  normal rate, regular rhythm, and no murmur.   Abdomen:  soft, non-tender, and normal bowel sounds.   Msk:  normal ROM about the lumbar spine, nontender spinous processes, paraspinal muscle hypertrophy noted, muscles nontender, no CVA tenderness and no skin lesions noted.   Pulses:  R radial normal and L radial normal.   Extremities:  No edema Neurologic:  alert & oriented X3, strength normal in all extremities, sensation intact to light touch, and gait normal.   Skin:  Intact without suspicious lesions or rashes Psych:  Oriented X3.     Impression & Recommendations:  Problem # 1:  LUMBAR STRAIN (ICD-847.2) Likely due to chronic muscle spasm due to prolonged standing at work.  Advised the pt to alternate raising his feet on a stool or phone book while at work to rest each leg respectively.  Also advised stretching and rest when possible.  Since he only needs Tylenol about once a week, will continue with as needed Tylenol to treat his pain for now.  No imaging needed at this time.  Problem # 2:  HYPERLIPIDEMIA (P102836.4)  Goal LDL <100 (due to age, sex, smoking and htn).  Not at goal.  Will change Zocor to Lipitor 80mg  daily and recheck a FLP in 1-3 months.  His updated medication list for this problem includes:    Lipitor 80 Mg Tabs (Atorvastatin calcium) .Marland Kitchen... Take 1 tablet by mouth at bedtime  Orders: T-Lipid Profile [phone])  Labs Reviewed: SGOT: 17 (01/02/2008)   SGPT: 15 (01/02/2008)   HDL:43 (01/02/2008), 45 (04/11/2007)  LDL:111 (01/02/2008), 116 (04/11/2007)  Chol:184 (01/02/2008), 194 (04/11/2007)  Trig:152 (01/02/2008), 166 (04/11/2007)  Problem # 3:  HYPERTENSION (ICD-401.9) Goal BP <130/80.  At goal.  Changed Lisinopril to 40mg  daily since the 80mg  is likely not adding much to his BP control and may help with the symptoms he experiences when he first takes his  meds.  His updated medication list for this problem includes:    Lisinopril 40 Mg Tabs (Lisinopril) .Marland Kitchen... Take 1  tablet by mouth once a day    Norvasc 10 Mg Tabs (Amlodipine besylate) .Marland Kitchen... Take 1 tablet by mouth once a day    Furosemide 40 Mg Tabs (Furosemide) .Marland Kitchen... Take 1 tablet by mouth once a day  Catapres 0.1 Mg Tabs (Clonidine hcl) .Marland Kitchen... Take 1 tablet by mouth two times a day    Hydralazine Hcl 10 Mg Tabs (Hydralazine hcl) .Marland Kitchen... Take 1 tablet by mouth two times a day  BP today: 122/74 Prior BP: 201/86 (12/17/2007)  Labs Reviewed: K+: 4.2 (01/02/2008) Creat: : 1.83 (01/02/2008)   Chol: 184 (01/02/2008)   HDL: 43 (01/02/2008)   LDL: 111 (01/02/2008)   TG: 152 (01/02/2008)  Problem # 4:  TOBACCO ABUSE (ICD-305.1) Spoke for about 3 minutes regarding methods for smoking cessation.  He is in the pre-contemplative phase.  Since the pt has failed Chantix and the nicotine patch, gave the pt a RX for the nicotine inhaler and instructed the pt on how to use it.  Will reassess at next visit.  The following medications were removed from the medication list:    Nicotine 21-14-7 Mg/24hr Kit (Nicotine) .Marland Kitchen... Place a 21mg  patch on the skin of your arm once a day, removing the old patch before placing a new one.  use each dose (21, 14 then 7) for 2 weeks, then stop. His updated medication list for this problem includes:    Nicotrol 10 Mg Inha (Nicotine) ..... Use as often as needed when you feel the urge to smoke.  use at least 6 cartridges per day, max of 16.  Problem # 5:  RENAL INSUFFICIENCY, CHRONIC (ICD-585.9) Likely due to longstanding htn.  Cr stable.  Has had a full workup in the past.  Will continue to monitor.  Discussed the potential for HD in the future with the pt today.  BMET reveals hyperglycemia again - while this is not a fasting sample, will get an A1c and a fasting glucose at next visit to screen for DM.  Orders: T-Basic Metabolic Panel ([id number])  Problem # 6:  Preventive  Health Care (ICD-V70.0) Refuses colonoscopy for now. Refuses PSA for now as well.  Complete Medication List: 1)  Lisinopril 40 Mg Tabs (Lisinopril) .... Take 1  tablet by mouth once a day 2)  Lipitor 80 Mg Tabs (Atorvastatin calcium) .... Take 1 tablet by mouth at bedtime 3)  Protonix 40 Mg Tbec (Pantoprazole sodium) .... Take 1 tablet by mouth once a day 4)  Norvasc 10 Mg Tabs (Amlodipine besylate) .... Take 1 tablet by mouth once a day 5)  Furosemide 40 Mg Tabs (Furosemide) .... Take 1 tablet by mouth once a day 6)  Catapres 0.1 Mg Tabs (Clonidine hcl) .... Take 1 tablet by mouth two times a day 7)  Hydralazine Hcl 10 Mg Tabs (Hydralazine hcl) .... Take 1 tablet by mouth two times a day 8)  Nicotrol 10 Mg Inha (Nicotine) .... Use as often as needed when you feel the urge to smoke.  use at least 6 cartridges per day, max of 16.  Patient Instructions: 1)  Please schedule a follow-up appointment in 1 month for a blood pressure check. 2)  Start taking ONE of the 40mg  tablets of Lisinopril once a day instead of two tablets as you were doing. 3)  Continue all your other medications as before (see below). 4)  Try filling the prescription for the Nicotine inhaler and use as directed below. 5)  Think about having a colonscopy and call us if you would like [place] to set up an appointment for you to have it done. Prescriptions: LIPITOR 80 MG TABS (ATORVASTATIN CALCIUM) Take 1 tablet by mouth at bedtime  #30 x 3   Entered and Authorized by:   Benjamine Mola  Jiles Crocker DO   Signed by:   Rico Sheehan DO on 07/16/2008   Method used:   Electronically to        [facility] (retail)       [address]       [place], [place]  [address]       Ph: [phone]       Fax: [phone]   RxID:   [id number] HYDRALAZINE HCL 10 MG  TABS (HYDRALAZINE HCL) Take 1 tablet by mouth two times a day  #60 x 3   Entered and Authorized by:   Rico Sheehan DO   Signed by:   Rico Sheehan  DO on 07/15/2008   Method used:   Electronically to        [facility] (retail)       [address]       [place], [place]  [address]       Ph: [phone]       Fax: [phone]   RxID:   [id number] CATAPRES 0.1 MG TABS (CLONIDINE HCL) Take 1 tablet by mouth two times a day  #60 x 3   Entered and Authorized by:   Rico Sheehan DO   Signed by:   Rico Sheehan DO on 07/15/2008   Method used:   Electronically to        [facility] (retail)       [address]       [place], [place]  [address]       Ph: [phone]       Fax: [phone]   RxID:   [id number] FUROSEMIDE 40 MG  TABS (FUROSEMIDE) Take 1 tablet by mouth once a day  #30 x 3   Entered and Authorized by:   Rico Sheehan DO   Signed by:   Rico Sheehan DO on 07/15/2008   Method used:   Electronically to        [facility] (retail)       [address]       [place], [place]  [address]       Ph: [phone]       Fax: [phone]   RxID:   [id number] [place] 10 MG TABS (AMLODIPINE BESYLATE) Take 1 tablet by mouth once a day  #30 x 3   Entered and Authorized by:   Rico Sheehan DO   Signed by:   Rico Sheehan DO on 07/15/2008   Method used:   Electronically to        [facility] (retail)       [address]       [place],   [address]       Ph: [phone]       Fax: [phone]   RxID:   [id number] PROTONIX 40 MG TBEC (PANTOPRAZOLE SODIUM) Take 1 tablet by mouth once a day  #30 x 3   Entered and Authorized by:   Rico Sheehan DO   Signed by:   Rico Sheehan DO on 07/15/2008   Method used:   Electronically to        [facility] (retail)       [address]  [place], [place]  [address]       Ph: [phone]       Fax: [phone]   RxID:   [id number] ZOCOR 40 MG TABS (SIMVASTATIN) Take 1 tablet by mouth once  a day at bedtime  #30 x 3   Entered and Authorized by:   Rico Sheehan DO   Signed by:   Rico Sheehan DO on 07/15/2008   Method used:   Electronically to        [facility] (retail)       [address]       [place], [place]  [address]       Ph: [phone]       Fax: [phone]   RxID:   [id number] LISINOPRIL 40 MG TABS (LISINOPRIL) Take 1  tablet by mouth once a day  #30 x 3   Entered and Authorized by:   Rico Sheehan DO   Signed by:   Rico Sheehan DO on 07/15/2008   Method used:   Electronically to        [facility] [address] (retail)       [address]       [place],   [address]       Ph: [phone]       Fax: [phone]   RxID:   [id number] NICOTROL 10 MG INHA (NICOTINE) Use as often as needed when you feel the urge to smoke.  Use at least 6 cartridges per day, max of 16.  #1 kit x 3   Entered and Authorized by:   Rico Sheehan DO   Signed by:   Rico Sheehan DO on 07/15/2008   Method used:   Print then Give to Patient   RxID:   [id number]

## 2010-04-04 NOTE — Assessment & Plan Note (Signed)
Summary: ACUTE/TOBBIA/LEG PAIN/CH   Vital Signs:  Patient profile:   60 year old male Height:      71 inches (180.34 cm) Weight:      158.4 pounds (72 kg) BMI:     22.17 Temp:     97.5 degrees F (36.39 degrees C) oral Pulse rate:   53 / minute BP sitting:   118 / 71  (right arm)  Vitals Entered By: Morrison Old RN (November 15, 2009 11:20 AM) CC: Left leg pain since last Friday;mainy at night; limping. Is Patient Diabetic? No Pain Assessment Patient in pain? no      Nutritional Status BMI of 19 -24 = normal  Have you ever been in a relationship where you felt threatened, hurt or afraid?Unable to ask; wife w/pt.   Does patient need assistance? Functional Status Self care Ambulation Normal   CC:  Left leg pain since last Friday;mainy at night; limping.William Perkins  History of Present Illness: This is 60 year old male with PMH with Hypertension, HLD, Dyspepsia, Renal insufficiency who presents to the clinic for left leg pain which started on Friday. He noticed pain in the knee area which moved up to the hip and down to feet, sharp in character, 6/10 in severity . Using  pain meds relieves the pain ( pain aid which includes tylenol, caffein, aspirin, salicylamine)  and  lying down worthen the pain . He mentioned numbness and tingling in the left lower leg but no weakness. The wife who was present during the visit noted that the patient is limping but it started before friday. He denies any swelling or redness, no traum,  no fevers, incontinence, changes in bowel movement, changes in urinary habits.   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   Note: History of back pain in the past but patient noted that this is different.     Depression History:      The patient denies a depressed mood most of the day and a diminished interest in his usual daily activities.         Preventive Screening-Counseling & Management  Alcohol-Tobacco     Alcohol drinks/day: 0  Smoking Status: current     Smoking Cessation Counseling: yes     Packs/Day: 1     Year Started: 1967  Caffeine-Diet-Exercise     Does Patient Exercise: yes     Type of exercise: WALKING  Current Medications (verified): 1)  Lisinopril 40 Mg Tabs (Lisinopril) .... Take 1  Tablet By Mouth Once A Day 2)  Pravachol 40 Mg Tabs (Pravastatin Sodium) .... Take 1 Tablet By Mouth Once A Day 3)  Omeprazole 40 Mg Cpdr (Omeprazole) .... Take 1 Tablet By Mouth Once A Day 4)  Norvasc 10 Mg Tabs (Amlodipine Besylate) .... Take 1 Tablet By Mouth Once A Day 5)  Furosemide 40 Mg  Tabs (Furosemide) .... Take 1 Tablet By Mouth Once A Day 6)  Catapres 0.1 Mg Tabs (Clonidine Hcl) .... Take 1 Tablet By Mouth Two Times A Day 7)  Hydralazine Hcl 10 Mg  Tabs (Hydralazine Hcl) .... Take 2 Tablet By Mouth Two Times A Day 8)  Aspir-Low 81 Mg Tbec (Aspirin) .... Take 1 Tablet By Mouth Once A Day 9)  Metamucil 30.9 % Powd (Psyllium) .... Take One Tablespoon and Mix With Water 2-3 Times A Day For Constipation.  Allergies: No Known Drug Allergies  Review of Systems       as per  HPI  Physical Exam  General:  Well-developed,well-nourished,in no acute distress; alert,appropriate and cooperative throughout examination Lungs:  Normal respiratory effort, chest expands symmetrically. Lungs are clear to auscultation, no crackles or wheezes. Heart:  Normal rate and regular rhythm. S1 and S2 normal without gallop, murmur, click, rub or other extra sounds. Abdomen:  Bowel sounds positive,abdomen soft and non-tender without masses, organomegaly or hernias noted. Msk:  normal ROM, no joint tenderness, no joint swelling, no joint warmth, no redness over joints, no joint deformities, no joint instability, no crepitation, and no muscle atrophy.   Pulses:  R and L carotid,radial,femoral,dorsalis pedis and posterior tibial pulses are full and equal bilaterally Extremities:  No clubbing, cyanosis, edema, or deformity noted with  normal full range of motion of all joints.   Neurologic:  No cranial nerve deficits noted. Station and gait are normal. Plantar reflexes are down-going bilaterally. DTRs are symmetrical throughout. Sensory, motor and coordinative functions appear intact. Noted some tingling from knee down to feet while examining    Impression & Recommendations:  Problem # 1:  LEG PAIN, LEFT (ICD-729.5) Leg pain since Friday which was not aggrevated by any trauma, injury or other acute event.  He noticed pain in the knee area which moved up to the hip and down to feet, sharp in character, 6/10 in severity . Using  pain meds relieves the pain ( pain aid which includes tylenol, caffein, aspirin, salicylamine)  and  lying down worthen the pain . He mentioned numbness and tingling in the left lower leg but no weakness.  He denies any swelling or redness, no traum,  no fevers, incontinence, changes in bowel movement, changes in urinary habits. DD is broad  include vacular with periphery artery disease or DVT, sprains, dislocation of the knee, ligament tears, bursitis, tendinits or sciatica especillay with the history of back pain in the past. There was a low probability for DVT on presentation but considering the acute setting of pain and hx of smoking 1 1/2 pack a day and chronic renal insufficiency we checked D-dimer which was negative.   Most likely diagnosis is sciatica and at this point we did not consider further diagnostic workup and treated the patient conservatively with tylenol for pain.   Patient was advised not take any NSAIDs for pain.    Orders: T-Comprehensive Metabolic Panel ([id number]) T-CBC w/Diff [phone]) T-Sed Rate (Automated) [id number]) T- * Misc. Laboratory test ([phone])  Complete Medication List: 1)  Lisinopril 40 Mg Tabs (Lisinopril) .... Take 1  tablet by mouth once a day 2)  Pravachol 40 Mg Tabs (Pravastatin sodium) .... Take 1 tablet by mouth once a day 3)  Omeprazole 40 Mg Cpdr  (Omeprazole) .... Take 1 tablet by mouth once a day 4)  Norvasc 10 Mg Tabs (Amlodipine besylate) .... Take 1 tablet by mouth once a day 5)  Furosemide 40 Mg Tabs (Furosemide) .... Take 1 tablet by mouth once a day 6)  Catapres 0.1 Mg Tabs (Clonidine hcl) .... Take 1 tablet by mouth two times a day 7)  Hydralazine Hcl 10 Mg Tabs (Hydralazine hcl) .... Take 2 tablet by mouth two times a day 8)  Aspir-low 81 Mg Tbec (Aspirin) .... Take 1 tablet by mouth once a day 9)  Metamucil 30.9 % Powd (Psyllium) .... Take one tablespoon and mix with water 2-3 times a day for constipation. 10)  Tylenol 325 Mg Tabs (Acetaminophen) .... Take one tablet by mouth q6hrs for pain.  Patient Instructions: 1)  Please schedule  a follow-up appointment as needed. 2)  Please schedule an appointment with your primary doctor in :  Prevention & Chronic Care Immunizations   Influenza vaccine: Historical  (12/03/2008)   Influenza vaccine deferral: Refused  (12/15/2008)    Tetanus booster: Not documented   Td booster deferral: Deferred  (10/07/2009)    Pneumococcal vaccine: Not documented  Colorectal Screening   Hemoccult: Not documented   Hemoccult action/deferral: Ordered  (12/15/2008)    Colonoscopy: Not documented   Colonoscopy action/deferral: Refused  (04/28/2009)  Other Screening   PSA: Not documented   Smoking status: current  (11/15/2009)   Smoking cessation counseling: yes  (11/15/2009)  Lipids   Total Cholesterol: 148  (04/28/2009)   Lipid panel action/deferral: Lipid Panel ordered   LDL: 79  (04/28/2009)   LDL Direct: Not documented   HDL: 41  (04/28/2009)   Triglycerides: 141  (04/28/2009)    SGOT (AST): 23  (04/28/2009)   BMP action: Ordered   SGPT (ALT): 16  (04/28/2009) CMP ordered    Alkaline phosphatase: 77  (04/28/2009)   Total bilirubin: 0.5  (04/28/2009)  Hypertension   Last Blood Pressure: 118 / 71  (11/15/2009)   Serum creatinine: 1.60  (10/21/2009)   BMP action: Ordered    Serum potassium 4.3  (10/21/2009) CMP ordered   Self-Management Support :   Personal Goals (by the next clinic visit) :      Personal blood pressure goal: 140/90  (04/28/2009)     Personal LDL goal: 100  (04/28/2009)    Patient will work on the following items until the next clinic visit to reach self-care goals:     Medications and monitoring: take my medicines every day, check my blood pressure, bring all of my medications to every visit  (11/15/2009)     Eating: use fresh or frozen vegetables, eat foods that are low in salt, eat baked foods instead of fried foods  (11/15/2009)     Activity: take a 30 minute walk every day  (11/15/2009)    Hypertension self-management support: Written self-care plan  (11/15/2009)   Hypertension self-care plan printed.    Lipid self-management support: Written self-care plan  (11/15/2009)   Lipid self-care plan printed.  Process Orders Check Orders Results:     [facility]: [id number] not required for this insurance Tests Sent for requisitioning (November 15, 2009 5:34 PM):     11/15/2009: [facility] -- T-Comprehensive Metabolic Panel [id number] (signed)     11/15/2009: [facility] -- T-CBC w/Diff [id number] (signed)     11/15/2009: [facility] -- T-Sed Rate (Automated) [id number] (signed)     11/15/2009: [facility] -- [place]. Laboratory test [phone] (signed)

## 2010-04-04 NOTE — Progress Notes (Signed)
Summary: med refill/gp  Phone Note Refill Request Message from:  Fax from Pharmacy on October 21, 2007 4:14 PM  Refills Requested: Medication #1:  FUROSEMIDE 40 MG  TABS Take 1 tablet by mouth once a day  Medication #2:  CATAPRES 0.1 MG TABS Take 1 tablet by mouth two times a day  Method Requested: Electronic Initial call taken by: Morrison Old RN,  October 21, 2007 4:15 PM  Follow-up for Phone Call        Pt needs to be seen before any more RF will be granted beyond those given today.  Please call the pt and make him an appt for sometime within the next 1-2 months. Follow-up by: Rico Sheehan DO,  October 22, 2007 9:23 AM      Prescriptions: CATAPRES 0.1 MG TABS (CLONIDINE HCL) Take 1 tablet by mouth two times a day  #60 x 1   Entered and Authorized by:   Rico Sheehan DO   Signed by:   Rico Sheehan DO on 10/22/2007   Method used:   Electronically sent to ...       [facility]       [address]       [place], [place]  [address]       Ph: [phone]       Fax: [phone]   RxID:   [id number] FUROSEMIDE 40 MG  TABS (FUROSEMIDE) Take 1 tablet by mouth once a day  #30 x 1   Entered and Authorized by:   Rico Sheehan DO   Signed by:   Rico Sheehan DO on 10/22/2007   Method used:   Electronically sent to ...       [facility]       [address]       [place], [place]  [address]       Ph: [phone]       Fax: [phone]   RxID:   [id number]    Appended Document: med refill/gp Pt. was called about scheduling an appt.stated he will call back Monday.  Also flag sent to [facility].

## 2010-05-04 ENCOUNTER — Other Ambulatory Visit: Payer: Self-pay | Admitting: *Deleted

## 2010-05-04 DIAGNOSIS — I10 Essential (primary) hypertension: Secondary | ICD-10-CM

## 2010-05-04 DIAGNOSIS — I509 Heart failure, unspecified: Secondary | ICD-10-CM

## 2010-05-05 MED ORDER — FUROSEMIDE 40 MG PO TABS: 40.0000 mg | ORAL_TABLET | Freq: Every day | ORAL | Status: DC

## 2010-05-05 MED ORDER — HYDRALAZINE HCL 10 MG PO TABS: 20.0000 mg | ORAL_TABLET | Freq: Two times a day (BID) | ORAL | Status: DC

## 2010-05-18 ENCOUNTER — Encounter: Payer: Self-pay | Admitting: Internal Medicine

## 2010-06-08 ENCOUNTER — Other Ambulatory Visit: Payer: Self-pay | Admitting: *Deleted

## 2010-06-08 MED ORDER — AMLODIPINE BESYLATE 10 MG PO TABS: 10.0000 mg | ORAL_TABLET | Freq: Every day | ORAL | Status: DC

## 2010-07-17 ENCOUNTER — Encounter: Payer: Self-pay | Admitting: Internal Medicine

## 2010-07-17 ENCOUNTER — Ambulatory Visit (INDEPENDENT_AMBULATORY_CARE_PROVIDER_SITE_OTHER): Payer: 59 | Admitting: Internal Medicine

## 2010-07-17 DIAGNOSIS — I10 Essential (primary) hypertension: Secondary | ICD-10-CM

## 2010-07-17 DIAGNOSIS — K219 Gastro-esophageal reflux disease without esophagitis: Secondary | ICD-10-CM

## 2010-07-17 DIAGNOSIS — N189 Chronic kidney disease, unspecified: Secondary | ICD-10-CM

## 2010-07-17 DIAGNOSIS — E785 Hyperlipidemia, unspecified: Secondary | ICD-10-CM

## 2010-07-17 DIAGNOSIS — I2510 Atherosclerotic heart disease of native coronary artery without angina pectoris: Secondary | ICD-10-CM

## 2010-07-17 LAB — LIPID PANEL
Cholesterol: 180 mg/dL (ref 0–200)
HDL: 39 mg/dL — ABNORMAL LOW (ref >39)
LDL Cholesterol: 100 mg/dL — ABNORMAL HIGH (ref 0–99)
Total CHOL/HDL Ratio: 4.6 Ratio
Triglycerides: 207 mg/dL — ABNORMAL HIGH (ref <150)
VLDL: 41 mg/dL — ABNORMAL HIGH (ref 0–40)

## 2010-07-17 LAB — TSH: TSH: 0.789 u[IU]/mL (ref 0.350–4.500)

## 2010-07-17 MED ORDER — CLONIDINE HCL 0.1 MG PO TABS: 0.1000 mg | ORAL_TABLET | Freq: Two times a day (BID) | ORAL | Status: DC

## 2010-07-17 MED ORDER — AMLODIPINE BESYLATE 10 MG PO TABS: 10.0000 mg | ORAL_TABLET | Freq: Every day | ORAL | Status: DC

## 2010-07-17 MED ORDER — ASPIRIN 81 MG PO TABS: 81.0000 mg | ORAL_TABLET | Freq: Every day | ORAL | Status: DC

## 2010-07-17 MED ORDER — HYDRALAZINE HCL 10 MG PO TABS: 20.0000 mg | ORAL_TABLET | Freq: Two times a day (BID) | ORAL | Status: DC

## 2010-07-17 MED ORDER — PRAVASTATIN SODIUM 40 MG PO TABS: 40.0000 mg | ORAL_TABLET | Freq: Every day | ORAL | Status: DC

## 2010-07-17 MED ORDER — OMEPRAZOLE 40 MG PO CPDR: 40.0000 mg | DELAYED_RELEASE_CAPSULE | Freq: Every day | ORAL | Status: DC

## 2010-07-17 MED ORDER — LISINOPRIL-HYDROCHLOROTHIAZIDE 20-12.5 MG PO TABS: 2.0000 | ORAL_TABLET | Freq: Every day | ORAL | Status: DC

## 2010-07-17 NOTE — Assessment & Plan Note (Signed)
Patient's blood pressure has been elevated for the past few weeks per patient report. Will change his lisinopril to lisinopril/hydrochlorothiazide. Recheck of blood pressure at next followup we'll also check a TSH to ensure that this is not a contributing factor to his elevated blood pressure, give refills for his other medications.

## 2010-07-17 NOTE — Progress Notes (Signed)
  Subjective:    Patient ID: William Perkins, male    DOB: 1950-03-08, 60 y.o.   MRN: [id number]  HPI  Patient is a 60 year old male with a past medical history listed below, presents to outpatient clinic for routine followup, patient states that his blood pressure has been running higher than normal. He checks his blood pressure at home at least once a week, states that he has been under some stress recently but that is now resolving. Patient does not note any change in his urine habits, denies any headache nausea vomiting chest pain or shortness of breath.   Review of Systems  [all other systems reviewed and are negative       Objective:   Physical Exam  [nursing notereviewed. Constitutional: He is oriented to person, place, and time. He appears well-developed and well-nourished.  HENT:  Head: Normocephalic and atraumatic.  Eyes: Pupils are equal, round, and reactive to light.  Neck: Normal range of motion. No JVD present. No thyromegaly present.  Cardiovascular: Normal rate, regular rhythm and normal heart sounds.   Pulmonary/Chest: Effort normal and breath sounds normal. He has no wheezes. He has no rales.  Abdominal: Soft. Bowel sounds are normal. There is no tenderness. There is no rebound.  Musculoskeletal: Normal range of motion. He exhibits no edema.  Neurological: He is alert and oriented to person, place, and time.  Skin: Skin is warm and dry.          Assessment & Plan:

## 2010-07-17 NOTE — Patient Instructions (Signed)
Please take new blood pressure medication lisinopril-hctz.

## 2010-07-17 NOTE — Assessment & Plan Note (Signed)
We'll check fasting lipids today and continue current regiment

## 2010-07-17 NOTE — Assessment & Plan Note (Signed)
This appears to be stable with a baseline creatinine of 1.6-1.8 we'll recheck metabolic panel today

## 2010-07-18 LAB — COMPLETE METABOLIC PANEL WITH GFR
ALT: 10 U/L (ref 0–53)
AST: 17 U/L (ref 0–37)
Albumin: 4.3 g/dL (ref 3.5–5.2)
Alkaline Phosphatase: 53 U/L (ref 39–117)
BUN: 24 mg/dL — ABNORMAL HIGH (ref 6–23)
CO2: 22 mEq/L (ref 19–32)
Calcium: 9.8 mg/dL (ref 8.4–10.5)
Chloride: 107 mEq/L (ref 96–112)
Creat: 1.55 mg/dL — ABNORMAL HIGH (ref 0.40–1.50)
GFR, Est African American: 56 mL/min — ABNORMAL LOW (ref >60)
GFR, Est Non African American: 46 mL/min — ABNORMAL LOW (ref >60)
Glucose, Bld: 92 mg/dL (ref 70–99)
Potassium: 4.5 mEq/L (ref 3.5–5.3)
Sodium: 140 mEq/L (ref 135–145)
Total Bilirubin: 0.4 mg/dL (ref 0.3–1.2)
Total Protein: 7.2 g/dL (ref 6.0–8.3)

## 2010-07-18 LAB — CBC
HCT: 39.6 % (ref 39.0–52.0)
Hemoglobin: 13.3 g/dL (ref 13.0–17.0)
MCH: 29.8 pg (ref 26.0–34.0)
MCHC: 33.6 g/dL (ref 30.0–36.0)
MCV: 88.6 fL (ref 78.0–100.0)
Platelets: 303 10*3/uL (ref 150–400)
RBC: 4.47 MIL/uL (ref 4.22–5.81)
RDW: 13.8 % (ref 11.5–15.5)
WBC: 8.8 10*3/uL (ref 4.0–10.5)

## 2010-07-21 NOTE — Consult Note (Signed)
William Perkins, William Perkins                ACCOUNT NO.:  [id number]   MEDICAL RECORD NO.:  [id number]          PATIENT TYPE:  INP   LOCATION:  1843                         FACILITY:  [place]   PHYSICIAN:  Minus Breeding, MD, FACCDATE OF BIRTH:  1950-08-02   DATE OF CONSULTATION:  03/16/2006  DATE OF DISCHARGE:                                 CONSULTATION   PRIMARY CARE DOCTOR:  None.   CONSULTING:  Mohammed Kindle, M.D.   REASON FOR CONSULTATION:  Evaluate patient with abnormal EKG.   HISTORY OF PRESENT ILLNESS:  The patient is a 60 year old gentleman who  looks younger than his stated age.  He had not had any prior cardiac  history, though his wife reports that he has had hypertension which he  has not treated.  He presented to the emergency room because of acute  dizziness.  This happened when he stood up go to the bathroom this  morning.  At that time, there was no associated nausea, though he has  developed this in the ER.  He has never had this before.  He has had no  presyncope or syncope.  He has not had any palpitations.  On  presentation to the ER, he was not noted to be severely hypertensive at  235/135.  EKG demonstrated ST-segment elevation and inferolateral T-wave  inversions consistent with LVH and a repolarization pattern.  Cardiac  enzymes have thus far been negative.   The patient does report some atypical sporadic left-sided pain in his  lower rib cage and radiating around to his axilla.  He does not report  any substernal chest pressure or neck discomfort.  He does not have any  arm discomfort.  It does not occur with any shortness of breath, and has  no PND or orthopnea.   His wife reports that he is very sedentary.  He goes to work and sits at  a desk and then goes home and watches TV.  He smokes two packs of  cigarettes a day.   PAST MEDICAL HISTORY:  1. Hypertension, not treated.  2. Tobacco abuse.   PAST SURGICAL HISTORY:  None.   ALLERGIES:  None.   MEDICATIONS:  Over-the-counter.   SOCIAL HISTORY:  The patient is a two pack a day smoker for 37 years.  He is married, but he has no children.  He works as a Glass blower/designer.   FAMILY HISTORY:  Noncontributory for early coronary disease in first-  degree relatives.   REVIEW OF SYSTEMS:  Positive for constipation.  Negative for other  systems.   PHYSICAL EXAMINATION:  GENERAL:  The patient is in no distress.  VITAL SIGNS:  Blood pressure 238/125 initially, pulse 70.  HEENT:  Eyes are unremarkable.  Pupils equal, round and reactive to  light.  Fundi not visualized.  Oral mucosa unremarkable.  Poor  dentition.  NECK:  No jugular distension at 45 degrees.  Carotid upstroke brisk and  symmetric.  No bruits or thyromegaly.  Lymphatics - no cervical,  axillary, inguinal adenopathy.  LUNGS:  Clear to auscultation bilaterally.  BACK:  No costovertebral angle  tenderness.  CHEST:  Unremarkable.  HEART:  PMI not displaced or sustained.  Distant heart sounds.  S1-S2  within normal limits.  No S3, no S4, no murmurs.  ABDOMEN:  Flat,  positive bowel sounds, normal in frequency and pitch.  No bruits,  rebound, guarding.  No midline pulsatile mass, no hepatomegaly or  splenomegaly.  SKIN:  No rashes, no nodules.  EXTREMITIES:  There were 2+ pulses throughout.  No edema, no cyanosis,  no clubbing.  NEUROLOGIC:  Oriented to person, place and time.  Cranial nerves II-XII  grossly intact.  Motor grossly intact.   LABORATORY DATA:  Sodium 138, potassium 3.9, BUN 25, creatinine 1.7,  hemoglobin 13.2, WBC 8.1, platelets 250.  CK peak 491, MB 2.8, relative  index 0.6.  Troponin negative at 0.03.   MRI OF THE BRAIN:  Negative.   CHEST X-RAY:  Pending.   ASSESSMENT/PLAN:  1. Abnormal EKG.  The patient has an abnormal EKG that represents left      ventricle hypertrophy with repolarization changes.  He has no      symptoms consistent with acute infarct.  In fact, he is having no      chest pain at  all.  This is evidence of end-organ damage.  I      suspect his blood pressure had been very poorly controlled and very      high for awhile.  He also has renal insufficiency now.  I discussed      with him great length the risk of stroke and heart attack and renal      failure and death if he does not begin to take care of this.  He is      being managed with IV nitroglycerin.  The labetalol dropped his      heart rate.  I will give him p.o. clonidine and further medication      titration as needed.  He will need an evaluation of his      hypertension with a renal ultrasound and UA.  He also needs an      echocardiogram to look for LVH.  2. Chest discomfort.  His chest discomfort is atypical.  I do not      suggest an inpatient workup.  I might consider an outpatient      Cardiolite, given his risk factors.  3. Tobacco.  I had a long frank discussion with about need to stop      smoking.  He would benefit from Chantix and a smoking consult.  4. Renal sufficiency per the primary team.  5. Dizziness.  This may well be related to his hypertension versus      other.  I will manage the hypertension, and if his dizziness      persists, he can have further workup.      Minus Breeding, MD, [facility]  Electronically Signed     JH/MEDQ  D:  03/16/2006  T:  03/17/2006  Job:  [id number]

## 2010-07-21 NOTE — Assessment & Plan Note (Signed)
William Perkins                            CARDIOLOGY OFFICE NOTE   William Perkins, William Perkins                         MRN:          [id number]  DATE:04/30/2006                            DOB:          May 17, 1950    PRIMARY CARE PHYSICIAN:  Rico Sheehan, D.O.   REASON FOR VISIT:  Evaluate patient with hypertension and left  ventricular hypertrophy.   HISTORY OF PRESENT ILLNESS:  The patient presents for follow-up of the  above.  I met him in the hospital on January 12, when he presented with  an abnormal EKG.  He was admitted with hypertensive urgency.  He had  some left-sided atypical chest discomfort.  He did have an  echocardiogram as part of that hospitalization that demonstrates an EF  of 55-60% with mildly increased left ventricular wall thickness.  He had  some mild renal insufficiency with a creatinine of 2.0 peak.   The patient was managed for his blood pressure.  He has been seen by Dr.  Jiles Crocker since and has had a change in his blood pressure medicines  apparently with discontinuation of Clonidine and hydrochlorothiazide and  initiation of amlodipine.   He returns today.  He describes no new complaints.  He has had none of  the chest discomfort he was having previously.  He has no neck or arm  discomfort.  He has not been having any palpitations, presyncope, or  syncope.  He has no new shortness of breath.  He is a little bit  fatigued occasionally.  He does not have the exercise tolerance he  thinks he should have.   PAST MEDICAL HISTORY:  Hypertension, tobacco abuse.   ALLERGIES:  No known drug allergies.   MEDICATIONS:  1. Lisinopril 20 mg daily.  2. Simvastatin 40 mg daily.  3. Amlodipine 10 mg daily.  4. Stool softener.  5. Pantoprazole.  6. Chantix 1 mg b.i.d.   REVIEW OF SYSTEMS:  As stated in the HPI and otherwise negative for  other systems.   PHYSICAL EXAMINATION:  GENERAL:  The patient is in no distress.  VITAL SIGNS:   Blood pressure 192/91, heart rate 67 and regular, body  mass index 22.  HEENT:  Eye lids unremarkable.  Pupils equal, round, and reactive to  light.  Fundi not visualized.  Oral mucosa unremarkable.  NECK:  No jugular venous distention to 45 degrees.  Carotid upstroke  brisk and symmetric.  No bruits and no thyromegaly.  LYMPHATICS:  No adenopathy.  LUNGS:  Clear to auscultation bilaterally.  BACK:  No costovertebral angle tenderness.  CHEST:  Unremarkable.  HEART:  PMI not displaced or sustained, S1 and S2 within normal limits.  No S3, no S4, no murmurs.  ABDOMEN:  Flat, positive bowel sounds.  Normal in frequency and pitch.  No bruits, no rebound, no guarding, no midline pulsatile mass, no  organomegaly.  SKIN:  No rashes, no nodules.  EXTREMITIES:  2+ pulses, no edema, no cyanosis, and no clubbing.  NEUROLOGY:  Grossly intact.   EKG; sinus rhythm, rate 62, axis within normal limits, intervals within  normal limits, left ventricular hypertrophy by voltage criteria, early  repolarization pattern consistent with hypertrophy.   ASSESSMENT:  1. Hypertension.  The patient's blood pressure is elevated today.      However, he just had his medicines changed by Dr. Jiles Crocker.  I have      emailed her today to tell her that I will defer medical management      to him and I told her that his blood pressure was still elevated      today.  He will need close attention to this.  I suggested      potentially spironolactone which I use frequently in resistant      hypertension if I know the patient will be compliant with labs.  I      do warn these people about the potential for impotence especially      in young gentleman.  Again, they need to have their potassium      followed very closely.  I will defer to Dr. Jiles Crocker.  He certainly      has end organ involvement of his hypertension and needs aggressive      control.  We have discussed therapeutic lifestyle changes.  2. Chest discomfort.  I do  think it is reasonable for this gentleman      to have a stress test when his blood pressure is better controlled.      I have asked that he be referred back to me if I am needed to      assist in the blood pressure management.  If not, I will see him      when his blood pressure is better controlled and he is able to have      a stress test.     Minus Breeding, MD, [facility]  Electronically Signed    JH/MedQ  DD: 04/30/2006  DT: 05/01/2006  Job #: [id number]   cc:   Rico Sheehan, D.O.

## 2010-07-21 NOTE — Discharge Summary (Signed)
William Perkins, STATZER NO.:  [id number]   MEDICAL RECORD NO.:  [id number]          PATIENT TYPE:  INP   LOCATION:  5034                         FACILITY:  [place]   PHYSICIAN:  Lucy Chris, MD     DATE OF BIRTH:  04/13/1950   DATE OF ADMISSION:  03/16/2006  DATE OF DISCHARGE:  03/19/2006                               DISCHARGE SUMMARY   DISCHARGE DIAGNOSES:  1. Hypertensive urgency.  2. Noncardiac chest pain.  3. Acute renal failure versus chronic renal insufficiency most likely      secondary to uncontrolled hypertension.  4. Tobacco abuse.  5. Constipation.   DISCHARGE MEDICATIONS:  1. Clonidine 0.1 mg p.o. b.i.d.  2. Colace 100 mg p.o. b.i.d.  3. HCTZ 25 mg p.o. daily.  4. Lisinopril 20 mg p.o. daily.  5. Protonix 40 mg p.o. daily.  6. Zocor 40 mg p.o. daily.   DISCHARGE DISPOSITION:  At the day of discharge, the patient had a blood  pressure of 166/44 which was the best that it had been in the hospital.  He was on the above-listed medications and will follow up with Dr.  Rico Sheehan on Friday, March 22, 2006 in the outpatient clinic and  can titrate up the medications as needed.  His creatinine in the  hospital had gone from 1.6-1.9 and then to 2.0 on the day of discharge  and it seemed like it might have been a mild ACE inhibitor effect on top  of a chronic renal insufficiency picture secondary to prolonged  uncontrolled hypertension.  This can be followed as an outpatient as  well.  Consultations done with [facility] cardiology, Dr. Percival Spanish.   PROCEDURES:  1. On March 16, 2006, an MRI of the brain done for dizziness showed      premature atrophy with chronic microvascular ischemic changes.  No      acute stroke.  Acute left maxillary sinus disease.  2. On March 17, 2006, a renal ultrasound showed upper limits of      normal renal echogenicity bilaterally and no evidence of      hydronephrosis.  3. On March 18, 2006, a 2-D  echocardiogram showed normal left      ventricular systolic function with an EF of 55-60% with mildly      increased left ventricular wall thickness.   ADMITTING HISTORY AND PHYSICAL:  Mr. Lannin is a 60 year old man with a  past medical history significant only for hypertension and tobacco use  who presented secondary to dizziness.  He reported having a feeling that  his head was spinning when he stood up that morning.  He had never had  any prior similar episodes and the episode was not associated with  tinnitus.  He also reported left-sided sharp chest pain without  radiation that started the day before admission, was not exertional and  was intermittent.  It lasted a few minutes and happened twice since the  day prior and then it went away.  He also complained of left-sided chest  pain and left back pain that was alleviated with deep breathing  and  moving.  He also reported burning chest pain associated with food intake  and relieved by taking Tums.   PHYSICAL EXAM:  Had a temperature of 97, an original blood pressure of  238/125, heart rate 70, respiratory rate 18, saturation were 98% on room  air.  He was in no acute distress resting comfortably.  His neck was  supple without carotid bruits or JVD.  LUNGS:  Clear to auscultation bilaterally.  HEART:  Regular rate and rhythm.  No murmurs, rubs or gallops.  ABDOMEN:  Soft, nontender, nondistended.  Bowel sounds positive.  EXTREMITIES:  No edema.  Pulses 2+ bilaterally.  He had no jaundice or  any rashes.  NEUROLOGY EXAM:  Alert and oriented x3.  Cranial nerves were normal.  Extraocular movements were intact.  His motor was 5/5 throughout upper  and lower extremities.  Sensation was intact and cerebellar was intact.   LABS:  Sodium 138, potassium 3.9, chloride 109, CO2 25, BUN 21,  creatinine 1.7, glucose 111.  Point of care markers were negative in the  emergency.  An MRI showed the results as above but basically no acute   findings.  An EKG showed a sinus rhythm with T-wave inversion in II,  III, aVF and V6 and possible ST elevation in V2 to V4.   HOSPITAL COURSE:  Problem 1:  HYPERTENSION:  Given the chest pain and  the EKG changes, it was believed at first that he had a hypertensive  emergency.  We started him on a nitroglycerin drip which brought his  blood pressure down overnight to approximately 99991111 systolic.  [facility] was consulted and Dr. Percival Spanish came to see the  patient.  It was believed that he had sinus rhythm on his EKG with left  ventricular hypertrophy and repolarization abnormalities.  They did not  believe that he had any evidence of acute ischemia and recommended  tobacco cessation and adding clonidine to his hypertension regimen.  They also recommended a 2-D echocardiogram which confirmed the left  ventricular hypertrophy.  For his blood pressure control, his clonidine  0.1 mg twice a day was added as well as HCTZ 25 mg and lisinopril 20 mg.  The nitroglycerin drip was titrated to off and by the day of discharge,  his blood pressure was 166/44 and recheck 149/79.  This can be followed  as an outpatient.   Problem 2:  ACUTE VERSUS CHRONIC RENAL FAILURE:  A renal ultrasound was  ordered and showed diffusely increased echogenicity of both kidneys  unspecific and he did not respond to hydration.  By the day of  discharge, his creatinine had increased to 2.0 after the addition of  lisinopril 20 mg.  I believe that this is chronic renal insufficiency  secondary to longstanding uncontrolled hypertension.  The patient was  made aware of this and he will follow up in the outpatient clinic by Dr.  Jiles Crocker.   Problem 3:  DIZZINESS:  This was resolved by the day of discharge and  was most likely secondary to #1.  Consider meclizine as an outpatient if  dizziness persists or recurs.   DISCHARGE LABS AND VITALS:  Temperature 97.4, blood pressure 149/79, heart rate 48-66, respiratory 18,  saturation of oxygen 96% on room air.  WBC 7.6, hemoglobin 12.8, platelets 222.  Sodium 135, potassium 4.3,  chloride 99, CO2 27, BUN 29, creatinine 2.02, glucose 137, calcium 9.6.      Mohammed Kindle, M.D.  Electronically Signed  ______________________________  Lucy Chris, MD    VD/MEDQ  D:  03/30/2006  T:  03/30/2006  Job:  [id number]   cc:   Rico Sheehan, D.O.  Minus Breeding, MD, [facility]

## 2011-01-19 ENCOUNTER — Other Ambulatory Visit: Payer: Self-pay | Admitting: Internal Medicine

## 2011-03-07 ENCOUNTER — Other Ambulatory Visit: Payer: Self-pay | Admitting: *Deleted

## 2011-03-07 DIAGNOSIS — E785 Hyperlipidemia, unspecified: Secondary | ICD-10-CM

## 2011-03-09 MED ORDER — PRAVASTATIN SODIUM 40 MG PO TABS: 40.0000 mg | ORAL_TABLET | Freq: Every day | ORAL | Status: DC

## 2011-05-19 ENCOUNTER — Encounter (HOSPITAL_COMMUNITY): Payer: Self-pay | Admitting: Nurse Practitioner

## 2011-05-19 ENCOUNTER — Emergency Department (HOSPITAL_COMMUNITY): Payer: BC Managed Care – PPO

## 2011-05-19 ENCOUNTER — Emergency Department (HOSPITAL_COMMUNITY)
Admission: EM | Admit: 2011-05-19 | Discharge: 2011-05-19 | Disposition: A | Discharge status: Home / Self Care | Payer: BC Managed Care – PPO | Attending: Emergency Medicine | Admitting: Emergency Medicine

## 2011-05-19 ENCOUNTER — Other Ambulatory Visit: Payer: Self-pay

## 2011-05-19 DIAGNOSIS — Z79899 Other long term (current) drug therapy: Secondary | ICD-10-CM | POA: Insufficient documentation

## 2011-05-19 DIAGNOSIS — R42 Dizziness and giddiness: Secondary | ICD-10-CM

## 2011-05-19 DIAGNOSIS — N289 Disorder of kidney and ureter, unspecified: Secondary | ICD-10-CM

## 2011-05-19 DIAGNOSIS — E785 Hyperlipidemia, unspecified: Secondary | ICD-10-CM | POA: Insufficient documentation

## 2011-05-19 DIAGNOSIS — I129 Hypertensive chronic kidney disease with stage 1 through stage 4 chronic kidney disease, or unspecified chronic kidney disease: Secondary | ICD-10-CM | POA: Insufficient documentation

## 2011-05-19 DIAGNOSIS — N189 Chronic kidney disease, unspecified: Secondary | ICD-10-CM | POA: Insufficient documentation

## 2011-05-19 DIAGNOSIS — R55 Syncope and collapse: Secondary | ICD-10-CM | POA: Insufficient documentation

## 2011-05-19 DIAGNOSIS — K219 Gastro-esophageal reflux disease without esophagitis: Secondary | ICD-10-CM | POA: Insufficient documentation

## 2011-05-19 LAB — DIFFERENTIAL
Basophils Absolute: 0.1 10*3/uL (ref 0.0–0.1)
Basophils Relative: 1 % (ref 0–1)
Eosinophils Absolute: 0.2 10*3/uL (ref 0.0–0.7)
Eosinophils Relative: 2 % (ref 0–5)
Lymphocytes Relative: 26 % (ref 12–46)
Lymphs Abs: 2.8 10*3/uL (ref 0.7–4.0)
Monocytes Absolute: 1 10*3/uL (ref 0.1–1.0)
Monocytes Relative: 9 % (ref 3–12)
Neutro Abs: 7.1 10*3/uL (ref 1.7–7.7)
Neutrophils Relative %: 64 % (ref 43–77)

## 2011-05-19 LAB — POCT I-STAT, CHEM 8
BUN: 56 mg/dL — ABNORMAL HIGH (ref 6–23)
Calcium, Ion: 1.14 mmol/L (ref 1.12–1.32)
Chloride: 104 mEq/L (ref 96–112)
Creatinine, Ser: 2.8 mg/dL — ABNORMAL HIGH (ref 0.50–1.35)
Glucose, Bld: 129 mg/dL — ABNORMAL HIGH (ref 70–99)
HCT: 39 % (ref 39.0–52.0)
Hemoglobin: 13.3 g/dL (ref 13.0–17.0)
Potassium: 3.4 mEq/L — ABNORMAL LOW (ref 3.5–5.1)
Sodium: 138 mEq/L (ref 135–145)
TCO2: 24 mmol/L (ref 0–100)

## 2011-05-19 LAB — CBC
HCT: 38.2 % — ABNORMAL LOW (ref 39.0–52.0)
Hemoglobin: 13.5 g/dL (ref 13.0–17.0)
MCH: 30.1 pg (ref 26.0–34.0)
MCHC: 35.3 g/dL (ref 30.0–36.0)
MCV: 85.3 fL (ref 78.0–100.0)
Platelets: 265 10*3/uL (ref 150–400)
RBC: 4.48 MIL/uL (ref 4.22–5.81)
RDW: 13.2 % (ref 11.5–15.5)
WBC: 11.1 10*3/uL — ABNORMAL HIGH (ref 4.0–10.5)

## 2011-05-19 LAB — TROPONIN I: Troponin I: <0.3 ng/mL (ref <0.30)

## 2011-05-19 MED ORDER — SODIUM CHLORIDE 0.9 % IV BOLUS (SEPSIS)
1000.0000 mL | Freq: Once | INTRAVENOUS | Status: AC
  Administered 2011-05-19: 1000 mL via INTRAVENOUS

## 2011-05-19 NOTE — ED Provider Notes (Signed)
History     CSN: [id number]  Arrival date & time 05/19/11  1704   First MD Initiated Contact with Patient 05/19/11 2012      Chief Complaint  Patient presents with  . Dizziness     HPI  History provided by the patient and spouse. Patient is a 61 year old African American male with history of hypertension, hyperlipidemia, chronic renal insufficiency, and tobacco abuse who presents with complaints of lightheadedness and dizziness for the past 3 days. Patient reports his symptoms are worse when he bends down and then stands back up quickly. He also notices that after standing from sitting positions. Symptoms are described as a general lightheadedness and feelings of near syncope. Patient denies any heart palpitations during episodes, shortness of breath, chest pain or discomfort. Patient reports that he feels well hydrated and denies any recent strenuous or additional activity. Patient denies having any swelling of the extremities. Patient denies any changes in urination. No polyurea or urinary retention, dysuria or hematuria. Patient denies having any fever, chills, sweats. Patient denies having similar symptoms previously.    Past Medical History  Diagnosis Date  . Hypertension   . Left ventricular hypertrophy   . Renal insufficiency   . Leg pain, left 11/15/2009  . Hand numbness 10/07/2009  . Soft tissue disorder 08/2008  . Hypertension 03/22/2006  . Hyperlipidemia 03/22/2008  . Chronic renal insufficiency 03/21/2006    CrCl <50 ml  . Left ventricular hypertrophy 03/22/2006  . Tobacco abuse 03/22/2006    2 ppd x 30 years  . GERD (gastroesophageal reflux disease) 03/22/2006  . Lumbar strain 07/15/2008    History reviewed. No pertinent past surgical history.  History reviewed. No pertinent family history.  History  Substance Use Topics  . Smoking status: Current Everyday Smoker -- 1.0 packs/day for 45 years    Types: Cigarettes  . Smokeless tobacco: Not on file  .  Alcohol Use: No     former      Review of Systems  Constitutional: Negative for fever, chills and diaphoresis.  Respiratory: Negative for cough and shortness of breath.   Cardiovascular: Negative for chest pain, palpitations and leg swelling.  Gastrointestinal: Negative for nausea and abdominal pain.  Neurological: Positive for light-headedness. Negative for dizziness, syncope, speech difficulty, weakness, numbness and headaches.  All other systems reviewed and are negative.    Allergies  Review of patient's allergies indicates no known allergies.  Home Medications   Current Outpatient Rx  Name Route Sig Dispense Refill  . AMLODIPINE BESYLATE 10 MG PO TABS Oral Take 10 mg by mouth daily.    Marland Kitchen CLONIDINE HCL 0.1 MG PO TABS Oral Take 0.1 mg by mouth 2 (two) times daily.    . FUROSEMIDE 40 MG PO TABS Oral Take 40 mg by mouth daily.      Marland Kitchen HYDRALAZINE HCL 10 MG PO TABS Oral Take 20 mg by mouth 2 (two) times daily.    Marland Kitchen LISINOPRIL-HYDROCHLOROTHIAZIDE 20-12.5 MG PO TABS Oral Take 2 tablets by mouth daily.    Marland Kitchen OMEPRAZOLE 40 MG PO CPDR Oral Take 40 mg by mouth daily.    Marland Kitchen PRAVASTATIN SODIUM 40 MG PO TABS Oral Take 40 mg by mouth daily.    . PSYLLIUM 30.9 % PO POWD  Take 1 tablespoon and mix water 2-3 times a day for constipation       BP 109/62  Pulse 64  Temp(Src) 98 F (36.7 C) (Oral)  Resp 18  Ht 5\' 11"  (1.803 m)  Wt 153 lb (69.4 kg)  BMI 21.34 kg/m2  SpO2 96%  Physical Exam  Nursing note and vitals reviewed. Constitutional: He is oriented to person, place, and time. He appears well-developed and well-nourished. No distress.  HENT:  Head: Normocephalic and atraumatic.  Mouth/Throat: Oropharynx is clear and moist.  Eyes: Conjunctivae and EOM are normal. Pupils are equal, round, and reactive to light.       No nystagmus  Neck: Normal range of motion. Neck supple. No JVD present. No thyromegaly present.       No carotid bruits  Cardiovascular: Normal rate and regular  rhythm.   No murmur heard. Pulmonary/Chest: Effort normal and breath sounds normal. No respiratory distress. He has no wheezes. He has no rales.  Abdominal: Soft. He exhibits no distension. There is no tenderness. There is no rebound and no guarding.  Musculoskeletal: He exhibits no edema and no tenderness.  Neurological: He is alert and oriented to person, place, and time. He has normal strength. No cranial nerve deficit or sensory deficit.  Skin: Skin is warm. No erythema.  Psychiatric: He has a normal mood and affect. His behavior is normal.    ED Course  Procedures   Results for orders placed during the hospital encounter of 05/19/11  TROPONIN I      Component Value Range   Troponin I <0.30  <0.30 (ng/mL)  CBC      Component Value Range   WBC 11.1 (*) 4.0 - 10.5 (K/uL)   RBC 4.48  4.22 - 5.81 (MIL/uL)   Hemoglobin 13.5  13.0 - 17.0 (g/dL)   HCT 38.2 (*) 39.0 - 52.0 (%)   MCV 85.3  78.0 - 100.0 (fL)   MCH 30.1  26.0 - 34.0 (pg)   MCHC 35.3  30.0 - 36.0 (g/dL)   RDW 13.2  11.5 - 15.5 (%)   Platelets 265  150 - 400 (K/uL)  DIFFERENTIAL      Component Value Range   Neutrophils Relative 64  43 - 77 (%)   Neutro Abs 7.1  1.7 - 7.7 (K/uL)   Lymphocytes Relative 26  12 - 46 (%)   Lymphs Abs 2.8  0.7 - 4.0 (K/uL)   Monocytes Relative 9  3 - 12 (%)   Monocytes Absolute 1.0  0.1 - 1.0 (K/uL)   Eosinophils Relative 2  0 - 5 (%)   Eosinophils Absolute 0.2  0.0 - 0.7 (K/uL)   Basophils Relative 1  0 - 1 (%)   Basophils Absolute 0.1  0.0 - 0.1 (K/uL)  POCT I-STAT, CHEM 8      Component Value Range   Sodium 138  135 - 145 (mEq/L)   Potassium 3.4 (*) 3.5 - 5.1 (mEq/L)   Chloride 104  96 - 112 (mEq/L)   BUN 56 (*) 6 - 23 (mg/dL)   Creatinine, Ser 2.80 (*) 0.50 - 1.35 (mg/dL)   Glucose, Bld 129 (*) 70 - 99 (mg/dL)   Calcium, Ion 1.14  1.12 - 1.32 (mmol/L)   TCO2 24  0 - 100 (mmol/L)   Hemoglobin 13.3  13.0 - 17.0 (g/dL)   HCT 39.0  39.0 - 52.0 (%)      Dg Chest 2  View  05/19/2011  *RADIOLOGY REPORT*  Clinical Data: Hypotension.  Dizziness.  CHEST - 2 VIEW  Comparison: Portable chest 03/16/2006.  Findings: The heart size is normal.  Mild emphysematous changes are evident.  No focal airspace disease is present.  The visualized soft tissues and  bony thorax are unremarkable.  IMPRESSION:  1.  Emphysema. 2.  No acute cardiopulmonary disease.  Original Report Authenticated By: Resa Miner. MATTERN, M.D.     1. Lightheadedness   2. Renal insufficiency       MDM  8:10 PM patient seen and evaluated. Patient in no acute distress. Patient currently without symptoms.  Pt feeling better after IV fluids.   Pt discussed with Attending Physician.  Plan to touch base with [facility] to arrange close follow up appointment.  [facility] has seen pt and will see pt on Monday.  Medical screening examination/treatment/procedure(s) were performed by non-physician practitioner and as supervising physician I was immediately available for consultation/collaboration. Katy Apo, M.D.    Date: 05/19/2011  Rate: 47  Rhythm: sinus bradycardia  QRS Axis: left  Intervals: normal  ST/T Wave abnormalities: nonspecific ST/T changes  Conduction Disutrbances:none  Narrative Interpretation: T-wave inversions in inferior leads unchanged nonspecific ST changes in V2 V3 unchanged  Old EKG Reviewed: unchanged from 03/17/1998          Martie Lee, PA 05/20/11 [place] III, MD 05/22/11 1153

## 2011-05-19 NOTE — Discharge Instructions (Signed)
You were seen and treated for your symptoms of lightheadedness and dizziness. Your lab tests, EKG of your heart and chest x-ray have not shown any concerning or emergent signs explain your symptoms. It has been arranged for you to have a followup appointment with your doctors on Monday. Please followup as planned. If you develop any worsening symptoms, chest pain, shortness of breath, fever, chills please return to the emergency room.   Near-Syncope Near-syncope is sudden weakness, dizziness, or feeling like you might pass out (faint). This may occur when getting up after sitting or while standing for a long period of time. Near-syncope can be caused by a drop in blood pressure. This is a common reaction, but it may occur to a greater degree in people taking medicines to control their blood pressure. Fainting often occurs when the blood pressure or pulse is too low to provide enough blood flow to the brain to keep you conscious. Fainting and near-syncope are not usually due to serious medical problems. However, certain people should be more cautious in the event of near-syncope, including elderly patients, patients with diabetes, and patients with a history of heart conditions (especially irregular rhythms).  CAUSES   Drop in blood pressure.   Physical pain.   Dehydration.   Heat exhaustion.   Emotional distress.   Low blood sugar.   Internal bleeding.   Heart and circulatory problems.   Infections.  SYMPTOMS   Dizziness.   Feeling sick to your stomach (nauseous).   Nearly fainting.   Body numbness.   Turning pale.   Tunnel vision.   Weakness.  HOME CARE INSTRUCTIONS   Lie down right away if you start feeling like you might faint. Breathe deeply and steadily. Wait until all the symptoms have passed. Most of these episodes last only a few minutes. You may feel tired for several hours.   Drink enough fluids to keep your urine clear or pale yellow.   If you are taking blood  pressure or heart medicine, get up slowly, taking several minutes to sit and then stand. This can reduce dizziness that is caused by a drop in blood pressure.  SEEK IMMEDIATE MEDICAL CARE IF:   You have a severe headache.   Unusual pain develops in the chest, abdomen, or back.   There is bleeding from the mouth or rectum, or you have black or tarry stool.   An irregular heartbeat or a very rapid pulse develops.   You have repeated fainting or seizure-like jerking during an episode.   You faint when sitting or lying down.   You develop confusion.   You have difficulty walking.   Severe weakness develops.   Vision problems develop.  MAKE SURE YOU:   Understand these instructions.   Will watch your condition.   Will get help right away if you are not doing well or get worse.  Document Released: 02/19/2005 Document Revised: 02/08/2011 Document Reviewed: 04/07/2010 [facility] Patient Information 2012 [place].

## 2011-05-19 NOTE — ED Notes (Signed)
Patient transported to X-ray 

## 2011-05-19 NOTE — ED Notes (Signed)
States since yesterday dizziness with movement and positioning, has felt he might pass out. Denies actual LOC. No pain. A&Ox4

## 2011-05-28 ENCOUNTER — Encounter: Payer: Self-pay | Admitting: Internal Medicine

## 2011-05-28 ENCOUNTER — Ambulatory Visit (INDEPENDENT_AMBULATORY_CARE_PROVIDER_SITE_OTHER): Payer: BC Managed Care – PPO | Admitting: Internal Medicine

## 2011-05-28 VITALS — BP 118/70 | HR 56 | Temp 97.6°F | Ht 71.0 in | Wt 162.5 lb

## 2011-05-28 DIAGNOSIS — N179 Acute kidney failure, unspecified: Secondary | ICD-10-CM

## 2011-05-28 DIAGNOSIS — K219 Gastro-esophageal reflux disease without esophagitis: Secondary | ICD-10-CM

## 2011-05-28 DIAGNOSIS — E785 Hyperlipidemia, unspecified: Secondary | ICD-10-CM

## 2011-05-28 DIAGNOSIS — F17200 Nicotine dependence, unspecified, uncomplicated: Secondary | ICD-10-CM

## 2011-05-28 DIAGNOSIS — N189 Chronic kidney disease, unspecified: Secondary | ICD-10-CM | POA: Insufficient documentation

## 2011-05-28 DIAGNOSIS — I10 Essential (primary) hypertension: Secondary | ICD-10-CM

## 2011-05-28 LAB — BASIC METABOLIC PANEL
BUN: 27 mg/dL — ABNORMAL HIGH (ref 6–23)
CO2: 27 mEq/L (ref 19–32)
Calcium: 9.6 mg/dL (ref 8.4–10.5)
Chloride: 98 mEq/L (ref 96–112)
Creat: 1.77 mg/dL — ABNORMAL HIGH (ref 0.50–1.35)
Glucose, Bld: 115 mg/dL — ABNORMAL HIGH (ref 70–99)
Potassium: 3.7 mEq/L (ref 3.5–5.3)
Sodium: 133 mEq/L — ABNORMAL LOW (ref 135–145)

## 2011-05-28 LAB — TSH: TSH: 1.06 u[IU]/mL (ref 0.350–4.500)

## 2011-05-28 MED ORDER — OMEPRAZOLE 40 MG PO CPDR: 40.0000 mg | DELAYED_RELEASE_CAPSULE | Freq: Every day | ORAL | Status: DC

## 2011-05-28 MED ORDER — PRAVASTATIN SODIUM 40 MG PO TABS: 40.0000 mg | ORAL_TABLET | Freq: Every day | ORAL | Status: DC

## 2011-05-28 MED ORDER — LISINOPRIL 20 MG PO TABS: 20.0000 mg | ORAL_TABLET | Freq: Every day | ORAL | Status: DC

## 2011-05-28 MED ORDER — AMLODIPINE BESYLATE 10 MG PO TABS: 10.0000 mg | ORAL_TABLET | Freq: Every day | ORAL | Status: DC

## 2011-05-28 MED ORDER — CLONIDINE HCL 0.1 MG PO TABS: 0.1000 mg | ORAL_TABLET | Freq: Two times a day (BID) | ORAL | Status: DC

## 2011-05-28 MED ORDER — HYDRALAZINE HCL 10 MG PO TABS: 20.0000 mg | ORAL_TABLET | Freq: Two times a day (BID) | ORAL | Status: DC

## 2011-05-28 MED ORDER — FUROSEMIDE 40 MG PO TABS: 20.0000 mg | ORAL_TABLET | Freq: Every day | ORAL | Status: DC

## 2011-05-28 NOTE — Assessment & Plan Note (Signed)
Patient's baseline creatinine is around 1.5, last week it was 2.8, thought to be prerenal in etiology. We'll check a basic metabolic panel today, and referral to renal to establish care for chronic kidney disease.

## 2011-05-28 NOTE — Assessment & Plan Note (Signed)
Patient was counseled on smoking cessation strategies including medications and behavior modification options. Patient said she was not ready to stop smoking at this time.

## 2011-05-28 NOTE — Patient Instructions (Addendum)
Please reduce your lasix dose to 20mg  daily Discontinue lisinopril-hydrochlorothiazide  and start taking lisinopril 20mg  daily.

## 2011-05-28 NOTE — Assessment & Plan Note (Signed)
Continue current dose pravastatin, prescription provided

## 2011-05-28 NOTE — Progress Notes (Signed)
Patient ID: William Perkins, male   DOB: [birth year], 61 y.o.   MRN: [id number]  HPI:   Patient is a pleasant 61 year old male with a past medical history listed below, presents to the outpatient clinic for emergency room followup, in the emergency room patient presented with nausea and dizziness he was found to be dehydrated, and was resuscitated with IV fluids. Today his symptoms have resolved, denies any other complaints. Would like also refills of his medications.  Review of Systems: Negative except per history of present illness  Physical Exam:  Nursing notes and vitals reviewed General:  alert, well-developed, and cooperative to examination.   Lungs:  normal respiratory effort, no accessory muscle use, normal breath sounds, no crackles, and no wheezes. Heart:  normal rate, regular rhythm, no murmurs, no gallop, and no rub.   Abdomen:  soft, non-tender, normal bowel sounds, no distention, no guarding, no rebound tenderness, no hepatomegaly, and no splenomegaly.   Extremities:  No cyanosis, clubbing, edema Neurologic:  alert & oriented X3, nonfocal exam  Meds: Medications Prior to Admission  Medication Sig Dispense Refill  . Psyllium (METAMUCIL) 30.9 % POWD Take 1 tablespoon and mix water 2-3 times a day for constipation        No current facility-administered medications on file as of 05/28/2011.    Allergies: Review of patient's allergies indicates no known allergies. Past Medical History  Diagnosis Date  . Hypertension   . Left ventricular hypertrophy   . Renal insufficiency   . Leg pain, left 11/15/2009  . Hand numbness 10/07/2009  . Soft tissue disorder 08/2008  . Hypertension 03/22/2006  . Hyperlipidemia 03/22/2008  . Chronic renal insufficiency 03/21/2006    CrCl <50 ml  . Left ventricular hypertrophy 03/22/2006  . Tobacco abuse 03/22/2006    2 ppd x 30 years  . GERD (gastroesophageal reflux disease) 03/22/2006  . Lumbar strain 07/15/2008   No past surgical history on  file. No family history on file. History   Social History  . Marital Status: Married    Spouse Name: N/A    Number of Children: N/A  . Years of Education: N/A   Occupational History  . Not on file.   Social History Main Topics  . Smoking status: Current Everyday Smoker -- 1.0 packs/day for 45 years    Types: Cigarettes  . Smokeless tobacco: Not on file  . Alcohol Use: No     former  . Drug Use: No  . Sexually Active: Not on file   Other Topics Concern  . Not on file   Social History Narrative  . No narrative on file

## 2011-05-28 NOTE — Assessment & Plan Note (Signed)
Patient's blood pressure is well-controlled, however given recent episode of prerenal acute kidney injury, I will reduce his Lasix to 20 mg daily, and also discontinue hydrochlorothiazide given the patient has chronic kidney disease.

## 2011-05-29 LAB — CBC
HCT: 35.3 % — ABNORMAL LOW (ref 39.0–52.0)
Hemoglobin: 11.8 g/dL — ABNORMAL LOW (ref 13.0–17.0)
MCH: 29.1 pg (ref 26.0–34.0)
MCHC: 33.4 g/dL (ref 30.0–36.0)
MCV: 87.2 fL (ref 78.0–100.0)
Platelets: 313 10*3/uL (ref 150–400)
RBC: 4.05 MIL/uL — ABNORMAL LOW (ref 4.22–5.81)
RDW: 13.1 % (ref 11.5–15.5)
WBC: 10 10*3/uL (ref 4.0–10.5)

## 2011-07-13 ENCOUNTER — Ambulatory Visit (INDEPENDENT_AMBULATORY_CARE_PROVIDER_SITE_OTHER): Payer: BC Managed Care – PPO | Admitting: Internal Medicine

## 2011-07-13 ENCOUNTER — Encounter: Payer: BC Managed Care – PPO | Admitting: Internal Medicine

## 2011-07-13 ENCOUNTER — Encounter: Payer: Self-pay | Admitting: Internal Medicine

## 2011-07-13 VITALS — BP 153/84 | HR 54 | Temp 97.2°F | Ht 71.0 in | Wt 159.2 lb

## 2011-07-13 DIAGNOSIS — E785 Hyperlipidemia, unspecified: Secondary | ICD-10-CM

## 2011-07-13 DIAGNOSIS — I10 Essential (primary) hypertension: Secondary | ICD-10-CM

## 2011-07-13 DIAGNOSIS — N179 Acute kidney failure, unspecified: Secondary | ICD-10-CM

## 2011-07-13 LAB — BASIC METABOLIC PANEL
BUN: 22 mg/dL (ref 6–23)
CO2: 23 mEq/L (ref 19–32)
Calcium: 9.6 mg/dL (ref 8.4–10.5)
Chloride: 103 mEq/L (ref 96–112)
Creat: 1.69 mg/dL — ABNORMAL HIGH (ref 0.50–1.35)
Glucose, Bld: 82 mg/dL (ref 70–99)
Potassium: 3.9 mEq/L (ref 3.5–5.3)
Sodium: 137 mEq/L (ref 135–145)

## 2011-07-13 NOTE — Patient Instructions (Signed)
Schedule a follow up appointment with your primary care provider in 3 months, sooner if needed. Keep taking your medications as directed. Call the clinic at [phone] with any concerns or questions.

## 2011-07-13 NOTE — Assessment & Plan Note (Signed)
Patient denies decreased urine output, shortness of breath, dyspnea on exertion, or increased lower extremity edema. He has an upcoming appointment to establish care with nephrology next Friday. Will repeat a basic metabolic panel today to assess his left leg status and renal function.

## 2011-07-13 NOTE — Assessment & Plan Note (Signed)
LDL at goal; LFTs wnl  Patient tolerating statin.  Will not make any changes to therapy at this time.

## 2011-07-13 NOTE — Progress Notes (Signed)
Patient ID: Neymar Theesfeld, male   DOB: [birth year], 61 y.o.   MRN: [id number] Subjective:     HPI: Patient is a 61 year old male presenting today for a routine followup visit.  Patient has first appointment with renal next Friday.  He reports trying to drink more water to stay hydrated following hospitalization for dehydration.  He denies any pain with urination; denies hematuria, or other problems urinating.  He denies SOB, DOE, chest pain, or increased edema.  Review of Systems Constitutional: Negative for fever, chills, diaphoresis, activity change, appetite change, fatigue and unexpected weight change.  HENT: Negative for hearing loss, congestion and neck stiffness.   Eyes: Negative for photophobia, pain and visual disturbance.  Respiratory: Negative for cough, chest tightness, shortness of breath and wheezing.   Cardiovascular: Negative for chest pain and palpitations.  Gastrointestinal: Negative for abdominal pain, blood in stool and anal bleeding.  Genitourinary: Negative for dysuria, hematuria and difficulty urinating.  Musculoskeletal: Negative for joint swelling.  Neurological: Negative for dizziness, syncope, speech difficulty, weakness, numbness and headaches. ]     Objective:   Physical Exam VItal signs reviewed and stable. GEN: No apparent distress.  Alert and oriented x 3.  Pleasant, conversant, and cooperative to exam. HEENT: head is autraumatic and normocephalic.  Neck is supple without palpable masses or lymphadenopathy.  No JVD or carotid bruits.  Vision intact.  EOMI.  PERRLA.  Sclerae anicteric.  Conjunctivae without pallor or injection. Mucous membranes are moist.  Oropharynx is without erythema, exudates, or other abnormal lesions.  Dentition is poor. RESP:  Lungs are clear to ascultation bilaterally with good air movement.  No wheezes, ronchi, or rubs. CARDIOVASCULAR: regular rate, normal rhythm.  Clear S1, S2, no murmurs, gallops, or rubs. ABDOMEN: soft, non-tender,  non-distended.  Bowels sounds present in all quadrants and normoactive.  No palpable masses. EXT: warm and dry.  Peripheral pulses equal, intact, and +2 globally.  No clubbing or cyanosis.  No edema in bilateral lower extremities. SKIN: warm and dry with normal turgor.  No rashes or abnormal lesions observed. NEURO: CN II-XII grossly intact.  Muscle strength +5/5 in bilateral upper and lower extremities.  Sensation is grossly intact.  No focal deficit.     Assessment/Plan:

## 2011-07-13 NOTE — Assessment & Plan Note (Signed)
BP elevated slightly above goal today.  Patient reports compliance with all medications and denies any adverse effect.   Will not make any changes to his regimen at this time.  Will check BMET today to assess electrolyte status and renal function as outlined below.  Would consider increase in lisinopril vs hydralazine for improved BP control pending results of BMET. Will follow up recommendations from nephrology regarding further evaluation and continued management of patient's chronic renal insufficiency and hypertension.

## 2011-07-20 ENCOUNTER — Other Ambulatory Visit: Payer: Self-pay | Admitting: Nephrology

## 2011-07-20 DIAGNOSIS — N183 Chronic kidney disease, stage 3 unspecified: Secondary | ICD-10-CM

## 2011-07-27 ENCOUNTER — Ambulatory Visit
Admission: RE | Admit: 2011-07-27 | Discharge: 2011-07-27 | Disposition: A | Discharge status: Home / Self Care | Payer: BC Managed Care – PPO | Source: Ambulatory Visit | Attending: Nephrology | Admitting: Nephrology

## 2011-07-27 DIAGNOSIS — N183 Chronic kidney disease, stage 3 unspecified: Secondary | ICD-10-CM

## 2011-11-02 ENCOUNTER — Other Ambulatory Visit: Payer: Self-pay | Admitting: *Deleted

## 2011-11-02 DIAGNOSIS — I10 Essential (primary) hypertension: Secondary | ICD-10-CM

## 2011-11-06 MED ORDER — AMLODIPINE BESYLATE 10 MG PO TABS: 10.0000 mg | ORAL_TABLET | Freq: Every day | ORAL | Status: DC

## 2011-11-28 ENCOUNTER — Other Ambulatory Visit: Payer: Self-pay | Admitting: *Deleted

## 2011-11-28 DIAGNOSIS — I10 Essential (primary) hypertension: Secondary | ICD-10-CM

## 2011-11-28 DIAGNOSIS — K219 Gastro-esophageal reflux disease without esophagitis: Secondary | ICD-10-CM

## 2011-11-28 MED ORDER — HYDRALAZINE HCL 10 MG PO TABS: 20.0000 mg | ORAL_TABLET | Freq: Two times a day (BID) | ORAL | Status: DC

## 2011-11-28 MED ORDER — FUROSEMIDE 40 MG PO TABS: 20.0000 mg | ORAL_TABLET | Freq: Every day | ORAL | Status: DC

## 2011-11-28 MED ORDER — OMEPRAZOLE 40 MG PO CPDR: 40.0000 mg | DELAYED_RELEASE_CAPSULE | Freq: Every day | ORAL | Status: DC

## 2011-11-28 MED ORDER — CLONIDINE HCL 0.1 MG PO TABS: 0.1000 mg | ORAL_TABLET | Freq: Two times a day (BID) | ORAL | Status: DC

## 2011-11-28 MED ORDER — LISINOPRIL 20 MG PO TABS: 20.0000 mg | ORAL_TABLET | Freq: Every day | ORAL | Status: DC

## 2011-11-28 NOTE — Telephone Encounter (Signed)
Needs an Appointment to review his BP medications.

## 2011-11-28 NOTE — Telephone Encounter (Signed)
Talked with pt and pt decided to call back to make appt and will send reminder to front pool desk.

## 2011-12-07 ENCOUNTER — Ambulatory Visit (INDEPENDENT_AMBULATORY_CARE_PROVIDER_SITE_OTHER): Payer: BC Managed Care – PPO | Admitting: Internal Medicine

## 2011-12-07 ENCOUNTER — Encounter: Payer: Self-pay | Admitting: Internal Medicine

## 2011-12-07 VITALS — BP 153/82 | HR 60 | Temp 97.1°F | Ht 71.0 in | Wt 159.3 lb

## 2011-12-07 DIAGNOSIS — I10 Essential (primary) hypertension: Secondary | ICD-10-CM

## 2011-12-07 DIAGNOSIS — E785 Hyperlipidemia, unspecified: Secondary | ICD-10-CM

## 2011-12-07 MED ORDER — HYDRALAZINE HCL 25 MG PO TABS
25.0000 mg | ORAL_TABLET | Freq: Two times a day (BID) | ORAL | Status: DC
Start: 2011-12-07 — End: 2012-03-28

## 2011-12-07 NOTE — Assessment & Plan Note (Signed)
Lipid panel today. Continue pravastatin 40 qHS

## 2011-12-07 NOTE — Assessment & Plan Note (Addendum)
BP continues to be mildly elevated. Will increase hydralazine to 25mg  BID.  Counseled on low salt diet.  RTC in 3 months for BP follow up.  Patient to follow with Nephro in 04/2012.

## 2011-12-07 NOTE — Patient Instructions (Signed)
-  Please increase hydralazine to 25mg  twice daily.  -Please be sure to follow up with your nephrologist.  Please be sure to bring all of your medications with you to every visit.  Should you have any new or worsening symptoms, please be sure to call the clinic at [phone].

## 2011-12-07 NOTE — Progress Notes (Signed)
Subjective:   Patient ID: William Perkins male   DOB: [birth year] 61 y.o.   MRN: [id number]  HPI: [name] is a 61 y.o. man with h/o HTN & renal insufficiency returns to clinic to f/u on blood pressure.  He has been compliant with his extensive anti hypertensive regimen and is without complaints.  He does not need refills today.  He has no complaints.   F/u renal in feb - dr patel  Past Medical History  Diagnosis Date  . Hypertension   . Left ventricular hypertrophy   . Renal insufficiency   . Leg pain, left 11/15/2009  . Hand numbness 10/07/2009  . Soft tissue disorder 08/2008  . Hypertension 03/22/2006  . Hyperlipidemia 03/22/2008  . Chronic renal insufficiency 03/21/2006    CrCl <50 ml  . Left ventricular hypertrophy 03/22/2006  . Tobacco abuse 03/22/2006    2 ppd x 30 years  . GERD (gastroesophageal reflux disease) 03/22/2006  . Lumbar strain 07/15/2008   Current Outpatient Prescriptions  Medication Sig Dispense Refill  . amLODipine (NORVASC) 10 MG tablet Take 1 tablet (10 mg total) by mouth daily.  30 tablet  3  . cloNIDine (CATAPRES) 0.1 MG tablet Take 1 tablet (0.1 mg total) by mouth 2 (two) times daily.  60 tablet  3  . furosemide (LASIX) 40 MG tablet Take 0.5 tablets (20 mg total) by mouth daily.  30 tablet  0  . hydrALAZINE (APRESOLINE) 10 MG tablet Take 2 tablets (20 mg total) by mouth 2 (two) times daily.  60 tablet  3  . lisinopril (PRINIVIL,ZESTRIL) 20 MG tablet Take 1 tablet (20 mg total) by mouth daily.  30 tablet  11  . omeprazole (PRILOSEC) 40 MG capsule Take 1 capsule (40 mg total) by mouth daily.  30 capsule  3  . pravastatin (PRAVACHOL) 40 MG tablet Take 1 tablet (40 mg total) by mouth daily.  30 tablet  3  . Psyllium (METAMUCIL) 30.9 % POWD Take 1 tablespoon and mix water 2-3 times a day for constipation       . DISCONTD: lisinopril-hydrochlorothiazide (PRINZIDE,ZESTORETIC) 20-12.5 MG per tablet Take 2 tablets by mouth daily.       No family history on  file. History   Social History  . Marital Status: Married    Spouse Name: N/A    Number of Children: N/A  . Years of Education: N/A   Social History Main Topics  . Smoking status: Current Every Day Smoker -- 1.0 packs/day for 45 years    Types: Cigarettes  . Smokeless tobacco: None  . Alcohol Use: No     former  . Drug Use: No  . Sexually Active: None   Other Topics Concern  . None   Social History Narrative  . None   Review of Systems: General: no fevers, chills, changes in weight, changes in appetite Skin: no rash HEENT: no blurry vision, hearing changes, sore throat Pulm: no dyspnea, coughing, wheezing CV: no chest pain, palpitations, shortness of breath Abd: no abdominal pain, nausea/vomiting, diarrhea/constipation GU: no dysuria, hematuria, polyuria Ext: no arthralgias, myalgias Neuro: no weakness, numbness, or tingling   Objective:  Physical Exam: Filed Vitals:   12/07/11 1444  BP: 142/79  Pulse: 58  Temp: 97.1 F (36.2 C)  TempSrc: Oral  Height: 5\' 11"  (1.803 m)  Weight: 159 lb 4.8 oz (72.258 kg)  SpO2: 99%   Constitutional: Vital signs reviewed.  Patient is a well-developed and well-nourished man in no acute distress and cooperative  with exam.  Mouth: no erythema or exudates, MMM, poor dentition Eyes: PERRL, EOMI, conjunctivae normal, No scleral icterus.  Cardiovascular: RRR, S1 normal, S2 normal, no MRG, pulses symmetric and intact bilaterally Pulmonary/Chest: CTAB, no wheezes, rales, or rhonchi Abdominal: Soft. Non-tender, non-distended, bowel sounds are normal, no masses, organomegaly, or guarding present.  Neurological: A&O x3 Skin: Warm, dry and intact. No rash, cyanosis, or clubbing.  Psychiatric: Normal mood and affect. speech and behavior is normal. Judgment and thought content normal. Cognition and memory are normal.   Assessment & Plan:  Case and care discussed with Dr. Murlean Caller. Patient to return in 3 months for BP follow up. PLease see  problem oriented charting for further details.

## 2011-12-08 LAB — LIPID PANEL
Cholesterol: 165 mg/dL (ref 0–200)
HDL: 42 mg/dL (ref >39)
LDL Cholesterol: 101 mg/dL — ABNORMAL HIGH (ref 0–99)
Total CHOL/HDL Ratio: 3.9 Ratio
Triglycerides: 110 mg/dL (ref <150)
VLDL: 22 mg/dL (ref 0–40)

## 2012-03-21 ENCOUNTER — Other Ambulatory Visit: Payer: Self-pay | Admitting: Internal Medicine

## 2012-03-21 NOTE — Telephone Encounter (Signed)
Needs appt for BP check. Can be PCP or [facility] short appt slot just for BP

## 2012-03-25 NOTE — Telephone Encounter (Signed)
Flaf sent to front desk pool for appt per Dr Lynnae January.

## 2012-03-28 ENCOUNTER — Ambulatory Visit (INDEPENDENT_AMBULATORY_CARE_PROVIDER_SITE_OTHER): Payer: BC Managed Care – PPO | Admitting: Internal Medicine

## 2012-03-28 ENCOUNTER — Encounter: Payer: Self-pay | Admitting: Internal Medicine

## 2012-03-28 VITALS — BP 153/76 | HR 51 | Temp 97.0°F | Ht 71.0 in | Wt 154.8 lb

## 2012-03-28 DIAGNOSIS — I10 Essential (primary) hypertension: Secondary | ICD-10-CM

## 2012-03-28 DIAGNOSIS — K219 Gastro-esophageal reflux disease without esophagitis: Secondary | ICD-10-CM

## 2012-03-28 DIAGNOSIS — N189 Chronic kidney disease, unspecified: Secondary | ICD-10-CM

## 2012-03-28 DIAGNOSIS — Z0000 Encounter for general adult medical examination without abnormal findings: Secondary | ICD-10-CM | POA: Insufficient documentation

## 2012-03-28 DIAGNOSIS — I129 Hypertensive chronic kidney disease with stage 1 through stage 4 chronic kidney disease, or unspecified chronic kidney disease: Secondary | ICD-10-CM

## 2012-03-28 DIAGNOSIS — E785 Hyperlipidemia, unspecified: Secondary | ICD-10-CM

## 2012-03-28 MED ORDER — HYDRALAZINE HCL 50 MG PO TABS: 50.0000 mg | ORAL_TABLET | Freq: Two times a day (BID) | ORAL | Status: DC

## 2012-03-28 MED ORDER — CLONIDINE HCL 0.1 MG PO TABS: 0.1000 mg | ORAL_TABLET | Freq: Two times a day (BID) | ORAL | Status: DC

## 2012-03-28 MED ORDER — AMLODIPINE BESYLATE 10 MG PO TABS: 10.0000 mg | ORAL_TABLET | Freq: Every day | ORAL | Status: DC

## 2012-03-28 MED ORDER — OMEPRAZOLE 40 MG PO CPDR: 40.0000 mg | DELAYED_RELEASE_CAPSULE | Freq: Every day | ORAL | Status: DC

## 2012-03-28 MED ORDER — PRAVASTATIN SODIUM 40 MG PO TABS: 40.0000 mg | ORAL_TABLET | Freq: Every day | ORAL | Status: DC

## 2012-03-28 MED ORDER — LISINOPRIL 20 MG PO TABS: 20.0000 mg | ORAL_TABLET | Freq: Every day | ORAL | Status: DC

## 2012-03-28 NOTE — Assessment & Plan Note (Signed)
BP Readings from Last 3 Encounters:  03/28/12 153/76  12/07/11 153/82  07/13/11 153/84    Lab Results  Component Value Date   NA 137 07/13/2011   K 3.9 07/13/2011   CREATININE 1.69* 07/13/2011    Assessment:  Blood pressure control: mildly elevated  Progress toward BP goal:  unchanged  Comments:   Plan:  Medications:  continue current medications  Educational resources provided: brochure  Self management tools provided: home blood pressure logbook  Other plans: Blood pressure is not well controlled. Today blood pressure is still elevated. Patient does not like water pills. We'll increase his hydralazine dose from 25 to 50 mg twice a day.

## 2012-03-28 NOTE — Assessment & Plan Note (Signed)
-  Patient's colonoscopy, Zostavax and tetanus vaccinations are all up-to-date. -Patient received flu shot from his work in 03/2012

## 2012-03-28 NOTE — Assessment & Plan Note (Signed)
Patient has been followed up by Dr. Posey Pronto in [place] [facility]. He will have an appointment with his kidney doctor on 04/18/12. I offered to check his BMP, patient refused. He would like to get electrolytes check in his kidney doctor's office.

## 2012-03-28 NOTE — Progress Notes (Signed)
Patient ID: William Perkins, male   DOB: [birth year], 62 y.o.   MRN: [id number]  Subjective:   Patient ID: William Perkins male   DOB: September 26, 1950 62 y.o.   MRN: [id number]  CC: regular follow up for HTN HPI: William Perkins is a 62 y.o. man with h/o HTN & renal insufficiency returns to clinic to f/u on blood pressure.    Patient reports that he has been taking all his medications regularly. He does not have chest pain, shortness of breath, coughing, or leg edema. No other complaints. He says he will have an appointment with kidney Dr. on 04/18/12.  Denies fever, chills, fatigue, headaches,  abdominal pain,diarrhea, constipation, dysuria, urgency, frequency, hematuria, joint pain or leg swelling.  Past Medical History  Diagnosis Date  . Hypertension   . Left ventricular hypertrophy   . Renal insufficiency   . Leg pain, left 11/15/2009  . Hand numbness 10/07/2009  . Soft tissue disorder 08/2008  . Hypertension 03/22/2006  . Hyperlipidemia 03/22/2008  . Chronic renal insufficiency 03/21/2006    CrCl <50 ml  . Left ventricular hypertrophy 03/22/2006  . Tobacco abuse 03/22/2006    2 ppd x 30 years  . GERD (gastroesophageal reflux disease) 03/22/2006  . Lumbar strain 07/15/2008   Current Outpatient Prescriptions  Medication Sig Dispense Refill  . amLODipine (NORVASC) 10 MG tablet Take 1 tablet (10 mg total) by mouth daily.  30 tablet  3  . cloNIDine (CATAPRES) 0.1 MG tablet Take 1 tablet (0.1 mg total) by mouth 2 (two) times daily.  60 tablet  3  . hydrALAZINE (APRESOLINE) 50 MG tablet Take 1 tablet (50 mg total) by mouth 2 (two) times daily.  60 tablet  3  . lisinopril (PRINIVIL,ZESTRIL) 20 MG tablet Take 1 tablet (20 mg total) by mouth daily.  30 tablet  11  . omeprazole (PRILOSEC) 40 MG capsule Take 1 capsule (40 mg total) by mouth daily.  30 capsule  3  . pravastatin (PRAVACHOL) 40 MG tablet Take 1 tablet (40 mg total) by mouth daily.  30 tablet  5  . Psyllium (METAMUCIL) 30.9 % POWD Take 1  tablespoon and mix water 2-3 times a day for constipation       . [DISCONTINUED] lisinopril-hydrochlorothiazide (PRINZIDE,ZESTORETIC) 20-12.5 MG per tablet Take 2 tablets by mouth daily.       No family history on file. History   Social History  . Marital Status: Married    Spouse Name: N/A    Number of Children: N/A  . Years of Education: N/A   Social History Main Topics  . Smoking status: Current Every Day Smoker -- 1.0 packs/day for 45 years    Types: Cigarettes  . Smokeless tobacco: Not on file  . Alcohol Use: No     Comment: former  . Drug Use: No  . Sexually Active: Not on file   Other Topics Concern  . Not on file   Social History Narrative  . No narrative on file   Review of Systems: General: no fevers, chills, changes in weight, changes in appetite Skin: no rash HEENT: no blurry vision, hearing changes, sore throat Pulm: no dyspnea, coughing, wheezing CV: no chest pain, palpitations, shortness of breath Abd: no abdominal pain, nausea/vomiting, diarrhea/constipation GU: no dysuria, hematuria, polyuria Ext: no arthralgias, myalgias Neuro: no weakness, numbness, or tingling   Objective:  Physical Exam: Filed Vitals:   03/28/12 0936  BP: 153/76  Pulse: 51  Temp: 97 F (36.1 C)  TempSrc: Oral  Height: 5\' 11"  (1.803 m)  Weight: 154 lb 12.8 oz (70.217 kg)  SpO2: 99%   Constitutional: Vital signs reviewed.  Patient is a well-developed and well-nourished man in no acute distress and cooperative with exam.  Mouth: no erythema or exudates, MMM, poor dentition Eyes: PERRL, EOMI, conjunctivae normal, No scleral icterus.  Cardiovascular: RRR, S1 normal, S2 normal, no MRG, pulses symmetric and intact bilaterally Pulmonary/Chest: CTAB, no wheezes, rales, or rhonchi Abdominal: Soft. Non-tender, non-distended, bowel sounds are normal, no masses, organomegaly, or guarding present.  Neurological: A&O x3 Skin: Warm, dry and intact. No rash, cyanosis, or clubbing.    Psychiatric: Normal mood and affect. speech and behavior is normal. Judgment and thought content normal. Cognition and memory are normal.   Assessment & Plan:

## 2012-03-28 NOTE — Patient Instructions (Signed)
1. Please increase hydralazine dose to 50 mg twice a day from now. 2. Please take all medications as prescribed.  3. If you have worsening of your symptoms or new symptoms arise, please call the clinic [phone] or go to the ER immediately if symptoms are severe.  You have done great job in taking all your medications. I appreciate it very much. Please continue doing that.

## 2012-03-28 NOTE — Assessment & Plan Note (Signed)
His LDL was 101 on 12/07/11. Currently patient is taking pravastatin 40 mg daily. He did not noticed any side effects, such as muscle pain. We'll continue current management.

## 2012-05-07 ENCOUNTER — Other Ambulatory Visit: Payer: Self-pay | Admitting: Internal Medicine

## 2012-05-08 NOTE — Telephone Encounter (Signed)
I will refill once, but we need to get recent labs from [facility] (he apparently prefers to have lab draws there) to make sure his electrolytes are stable.  Last BMP here is from May 2013.   Thanks

## 2012-05-22 ENCOUNTER — Ambulatory Visit (INDEPENDENT_AMBULATORY_CARE_PROVIDER_SITE_OTHER): Payer: BC Managed Care – PPO | Admitting: Internal Medicine

## 2012-05-22 ENCOUNTER — Telehealth: Payer: Self-pay | Admitting: *Deleted

## 2012-05-22 ENCOUNTER — Encounter: Payer: Self-pay | Admitting: Internal Medicine

## 2012-05-22 VITALS — BP 141/74 | HR 51 | Temp 97.0°F | Ht 71.0 in | Wt 154.1 lb

## 2012-05-22 DIAGNOSIS — N189 Chronic kidney disease, unspecified: Secondary | ICD-10-CM

## 2012-05-22 DIAGNOSIS — F17200 Nicotine dependence, unspecified, uncomplicated: Secondary | ICD-10-CM

## 2012-05-22 DIAGNOSIS — I129 Hypertensive chronic kidney disease with stage 1 through stage 4 chronic kidney disease, or unspecified chronic kidney disease: Secondary | ICD-10-CM

## 2012-05-22 DIAGNOSIS — R634 Abnormal weight loss: Secondary | ICD-10-CM

## 2012-05-22 DIAGNOSIS — K5900 Constipation, unspecified: Secondary | ICD-10-CM

## 2012-05-22 DIAGNOSIS — R209 Unspecified disturbances of skin sensation: Secondary | ICD-10-CM

## 2012-05-22 DIAGNOSIS — I10 Essential (primary) hypertension: Secondary | ICD-10-CM

## 2012-05-22 DIAGNOSIS — J438 Other emphysema: Secondary | ICD-10-CM

## 2012-05-22 DIAGNOSIS — R630 Anorexia: Secondary | ICD-10-CM

## 2012-05-22 LAB — COMPREHENSIVE METABOLIC PANEL
ALT: 12 U/L (ref 0–53)
AST: 16 U/L (ref 0–37)
Albumin: 4.5 g/dL (ref 3.5–5.2)
Alkaline Phosphatase: 51 U/L (ref 39–117)
BUN: 29 mg/dL — ABNORMAL HIGH (ref 6–23)
CO2: 26 mEq/L (ref 19–32)
Calcium: 9.6 mg/dL (ref 8.4–10.5)
Chloride: 106 mEq/L (ref 96–112)
Creat: 1.64 mg/dL — ABNORMAL HIGH (ref 0.50–1.35)
Glucose, Bld: 91 mg/dL (ref 70–99)
Potassium: 3.7 mEq/L (ref 3.5–5.3)
Sodium: 140 mEq/L (ref 135–145)
Total Bilirubin: 0.6 mg/dL (ref 0.3–1.2)
Total Protein: 7.2 g/dL (ref 6.0–8.3)

## 2012-05-22 LAB — CBC WITH DIFFERENTIAL/PLATELET
Basophils Absolute: 0.1 10*3/uL (ref 0.0–0.1)
Basophils Relative: 1 % (ref 0–1)
Eosinophils Absolute: 0.1 10*3/uL (ref 0.0–0.7)
Eosinophils Relative: 2 % (ref 0–5)
HCT: 38.2 % — ABNORMAL LOW (ref 39.0–52.0)
Hemoglobin: 12.9 g/dL — ABNORMAL LOW (ref 13.0–17.0)
Lymphocytes Relative: 30 % (ref 12–46)
Lymphs Abs: 2.6 10*3/uL (ref 0.7–4.0)
MCH: 29.7 pg (ref 26.0–34.0)
MCHC: 33.8 g/dL (ref 30.0–36.0)
MCV: 88 fL (ref 78.0–100.0)
Monocytes Absolute: 0.7 10*3/uL (ref 0.1–1.0)
Monocytes Relative: 8 % (ref 3–12)
Neutro Abs: 5.3 10*3/uL (ref 1.7–7.7)
Neutrophils Relative %: 59 % (ref 43–77)
Platelets: 293 10*3/uL (ref 150–400)
RBC: 4.34 MIL/uL (ref 4.22–5.81)
RDW: 13.7 % (ref 11.5–15.5)
WBC: 8.9 10*3/uL (ref 4.0–10.5)

## 2012-05-22 LAB — ANEMIA PANEL
%SAT: 27 % (ref 20–55)
ABS Retic: 82.5 10*3/uL (ref 19.0–186.0)
Ferritin: 362 ng/mL — ABNORMAL HIGH (ref 22–322)
Folate: 13.6 ng/mL
Iron: 78 ug/dL (ref 42–165)
RBC.: 4.34 MIL/uL (ref 4.22–5.81)
Retic Ct Pct: 1.9 % (ref 0.4–2.3)
TIBC: 292 ug/dL (ref 215–435)
UIBC: 214 ug/dL (ref 125–400)
Vitamin B-12: 332 pg/mL (ref 211–911)

## 2012-05-22 MED ORDER — POLYETHYLENE GLYCOL 3350 17 G PO PACK: 17.0000 g | PACK | Freq: Every day | ORAL | Status: DC

## 2012-05-22 NOTE — Telephone Encounter (Signed)
Pt calls and states his "back is bothering me", ask if he is having pain he states no, that his R upper back stays hot all the time and desires to be seen for that, appt given for 1345 dr Aundra Dubin

## 2012-05-22 NOTE — Patient Instructions (Addendum)
General Instructions: Please go get a CT chest and C-spine neck as soon as possible Return in 2-3 weeks   Treatment Goals:  Goals (1 Years of Data) as of 05/22/12   BP goal <140/<90      Progress Toward Treatment Goals:  Treatment Goal 05/22/2012  Blood pressure unable to assess  Stop smoking smoking less    Self Care Goals & Plans:  Self Care Goal 05/22/2012  Manage my medications take my medicines as prescribed; bring my medications to every visit; refill my medications on time  Monitor my health keep track of my blood pressure  Eat healthy foods drink diet soda or water instead of juice or soda; eat more vegetables; eat foods that are low in salt; eat baked foods instead of fried foods; eat fruit for snacks and desserts; eat smaller portions  Be physically active park at the far end of the parking lot  Stop smoking set a quit date and stop smoking       Care Management & Community Referrals:  Referral 05/22/2012  Referrals made for care management support none needed  Referrals made to community resources (No Data)

## 2012-05-23 ENCOUNTER — Encounter: Payer: Self-pay | Admitting: Internal Medicine

## 2012-05-23 LAB — URINALYSIS, ROUTINE W REFLEX MICROSCOPIC
Bilirubin Urine: NEGATIVE
Glucose, UA: NEGATIVE mg/dL
Hgb urine dipstick: NEGATIVE
Ketones, ur: NEGATIVE mg/dL
Leukocytes, UA: NEGATIVE
Nitrite: NEGATIVE
Protein, ur: NEGATIVE mg/dL
Specific Gravity, Urine: 1.01 (ref 1.005–1.030)
Urobilinogen, UA: 0.2 mg/dL (ref 0.0–1.0)
pH: 5.5 (ref 5.0–8.0)

## 2012-05-23 NOTE — Assessment & Plan Note (Addendum)
Warm sensation in right back and right>left lower extremity.  Right upper arm not affected  Unclear etiology  With smoking history, weight loss, decreased appetite will w/u for malignancy with CMET, CBC, UA, anemia panel, CT chest w/o contrast (due to CKD), C-spine Imaging to r/o pancost tumor or tumor compression or malignancy Patient to f/u in 2-3 weeks to review imaging

## 2012-05-23 NOTE — Assessment & Plan Note (Signed)
Patient has an upcoming appt with Dr. [facility]

## 2012-05-23 NOTE — Assessment & Plan Note (Signed)
BP slightly elevated today 149/79 then 141/74 Cont Norvasc 10, Clonidine 0.1 bid, lasix 40 mg qd, hydralazine 50 mg bid, lisinopril 20 mg qd

## 2012-05-23 NOTE — Progress Notes (Signed)
  Subjective:    Patient ID: William Perkins, male    DOB: 09/03/1950, 62 y.o.   MRN: [id number]  HPI Comments: 62 y.o PMH dyslipidemia, tobacco abuse (taking Chantix given to him by his brother used to smoke 1.5 ppd since age 62 y.o, HTN, GERD, chronic kidney disease (follows with Dr. Posey [facility]), right kidney cyst, emphysema.    Patient c/o warm sensation on his entire right back.  Sensation is constant and has been present x 1 month but worsening x 2 weeks.  Warm sensation is worse when drinking liquids even water it increases the warmth in his back.  He has tried Aspirin 2 pills at a time which helps.  The warm sensation goes to his b/l feet as well right >left.  At times his feet burn.  He denies sob though states that when he breaths it feels tight.    He also complains of constipation, bloating and he takes Metamucil which is not helping so he tried OTC laxative Tuesday.  He had a small bowel movement Tuesday but has not had one since.  He took 2 laxative pills last night but has not been able to have a stool.     SH: married. No children. Smoker 1.5 ppd now smoking 2 cigarettes qd since being on Chantix. Denies EtOH and drugs. Works as a Lawyer  Constipation This is a chronic problem. Pertinent negatives include no abdominal pain, fever, nausea or vomiting. He has tried laxatives for the symptoms. The treatment provided no relief. There is no history of abdominal surgery.      Review of Systems  Constitutional: Positive for appetite change and unexpected weight change. Negative for fever and chills.       Decreased appetite  Wt loss 5-6 lbs over 1 month Feels sick   Respiratory: Positive for chest tightness. Negative for shortness of breath.   Cardiovascular: Negative for chest pain.  Gastrointestinal: Positive for constipation. Negative for nausea, vomiting and abdominal pain.  Neurological: Positive for light-headedness. Negative for weakness and headaches.   Denies currently but had this am Denies falls       Objective:   Physical Exam  Nursing note and vitals reviewed. Constitutional: He is oriented to person, place, and time. He appears well-developed and well-nourished. He is cooperative. No distress.  HENT:  Head: Normocephalic and atraumatic.  Mouth/Throat: Mucous membranes are normal. Abnormal dentition. No oropharyngeal exudate.  Eyes: Conjunctivae are normal. Pupils are equal, round, and reactive to light. Right eye exhibits no discharge. Left eye exhibits no discharge. No scleral icterus.  Cardiovascular: Normal rate, regular rhythm, S1 normal, S2 normal and normal heart sounds.   No murmur heard. Pulmonary/Chest: Effort normal and breath sounds normal.    Abdominal: Soft. Bowel sounds are normal. He exhibits no distension. There is no tenderness.  Musculoskeletal: He exhibits no edema.  Neurological: He is alert and oriented to person, place, and time. He has normal strength. A sensory deficit is present. Gait normal.  Right back with decreased sensation compared with left back  RLE (foot) with decreased sensation.    Skin: Skin is warm, dry and intact. No rash noted. He is not diaphoretic.  Psychiatric: He has a normal mood and affect. His speech is normal and behavior is normal. Judgment and thought content normal.          Assessment & Plan:  F/u 2-3 weeks after imaging studies performed for review

## 2012-05-23 NOTE — Assessment & Plan Note (Signed)
Noted on last  CXR  Will need pfts in the future

## 2012-05-23 NOTE — Assessment & Plan Note (Signed)
Failed Metamucil.  Will try Miralax

## 2012-05-23 NOTE — Assessment & Plan Note (Signed)
Patient cut down amount smoking after taking his brothers Chantix Still smoking 2 cigarettes daily fron 1.5 ppd thought trying to quit Ed. Smoking cessation.

## 2012-05-28 NOTE — Addendum Note (Signed)
Addended by: Cresenciano Genre on: 05/28/2012 02:55 PM   Modules accepted: Orders

## 2012-05-29 ENCOUNTER — Ambulatory Visit (HOSPITAL_COMMUNITY)
Admission: RE | Admit: 2012-05-29 | Discharge: 2012-05-29 | Disposition: A | Discharge status: Home / Self Care | Payer: BC Managed Care – PPO | Source: Ambulatory Visit | Attending: Internal Medicine | Admitting: Internal Medicine

## 2012-05-29 DIAGNOSIS — R634 Abnormal weight loss: Secondary | ICD-10-CM

## 2012-05-29 DIAGNOSIS — M5030 Other cervical disc degeneration, unspecified cervical region: Secondary | ICD-10-CM | POA: Insufficient documentation

## 2012-05-29 DIAGNOSIS — M259 Joint disorder, unspecified: Secondary | ICD-10-CM | POA: Insufficient documentation

## 2012-05-29 DIAGNOSIS — R209 Unspecified disturbances of skin sensation: Secondary | ICD-10-CM

## 2012-05-29 DIAGNOSIS — M5380 Other specified dorsopathies, site unspecified: Secondary | ICD-10-CM | POA: Insufficient documentation

## 2012-05-29 DIAGNOSIS — R630 Anorexia: Secondary | ICD-10-CM

## 2012-05-30 ENCOUNTER — Encounter: Payer: Self-pay | Admitting: Internal Medicine

## 2012-06-02 ENCOUNTER — Ambulatory Visit: Payer: BC Managed Care – PPO | Admitting: Internal Medicine

## 2012-06-02 ENCOUNTER — Other Ambulatory Visit (HOSPITAL_COMMUNITY): Payer: BC Managed Care – PPO

## 2012-06-06 ENCOUNTER — Ambulatory Visit (HOSPITAL_COMMUNITY): Admission: RE | Admit: 2012-06-06 | Payer: BC Managed Care – PPO | Source: Ambulatory Visit

## 2012-06-09 NOTE — Addendum Note (Signed)
Addended by: Cresenciano Genre on: 06/09/2012 08:59 PM   Modules accepted: Orders

## 2012-06-24 ENCOUNTER — Other Ambulatory Visit: Payer: Self-pay | Admitting: Internal Medicine

## 2012-06-24 DIAGNOSIS — I10 Essential (primary) hypertension: Secondary | ICD-10-CM

## 2012-06-24 MED ORDER — LISINOPRIL 20 MG PO TABS: 40.0000 mg | ORAL_TABLET | Freq: Every day | ORAL | Status: DC

## 2012-07-04 ENCOUNTER — Telehealth: Payer: Self-pay | Admitting: Internal Medicine

## 2012-07-04 NOTE — Telephone Encounter (Signed)
Spoke with the patient.  He is feeling better and the warm sensation in his back is resolved.  He does not want to pursue further imaging with CT neck/chest at this time.  Advised to revisit clinic if symptoms persist  Karlyn Agee MD [phone]

## 2012-07-30 ENCOUNTER — Other Ambulatory Visit: Payer: Self-pay | Admitting: Internal Medicine

## 2012-08-27 ENCOUNTER — Other Ambulatory Visit: Payer: Self-pay | Admitting: Internal Medicine

## 2012-09-17 ENCOUNTER — Encounter: Payer: Self-pay | Admitting: Internal Medicine

## 2012-09-22 ENCOUNTER — Telehealth: Payer: Self-pay | Admitting: Internal Medicine

## 2012-09-22 NOTE — Telephone Encounter (Signed)
The patient's wife calls and states that her husband's back hurts and she states they got a letter in the mail stating he should come in and they want an appointment. He is overall unchanged at home. She states that there have been imaging studies which showed no acute serious reason for the pain and he has not injured it recently.   William Perkins 6:58 AM 09/22/2012  Will forward to front desk pool to call back [phone] to schedule an appointment.

## 2012-09-25 ENCOUNTER — Encounter: Payer: Self-pay | Admitting: Internal Medicine

## 2012-09-25 ENCOUNTER — Ambulatory Visit (INDEPENDENT_AMBULATORY_CARE_PROVIDER_SITE_OTHER): Payer: BC Managed Care – PPO | Admitting: Internal Medicine

## 2012-09-25 VITALS — BP 116/67 | HR 72 | Temp 97.8°F | Ht 68.0 in | Wt 148.9 lb

## 2012-09-25 DIAGNOSIS — IMO0001 Reserved for inherently not codable concepts without codable children: Secondary | ICD-10-CM

## 2012-09-25 DIAGNOSIS — F17200 Nicotine dependence, unspecified, uncomplicated: Secondary | ICD-10-CM

## 2012-09-25 DIAGNOSIS — Z0000 Encounter for general adult medical examination without abnormal findings: Secondary | ICD-10-CM

## 2012-09-25 DIAGNOSIS — K219 Gastro-esophageal reflux disease without esophagitis: Secondary | ICD-10-CM

## 2012-09-25 DIAGNOSIS — N189 Chronic kidney disease, unspecified: Secondary | ICD-10-CM

## 2012-09-25 DIAGNOSIS — I129 Hypertensive chronic kidney disease with stage 1 through stage 4 chronic kidney disease, or unspecified chronic kidney disease: Secondary | ICD-10-CM

## 2012-09-25 DIAGNOSIS — E785 Hyperlipidemia, unspecified: Secondary | ICD-10-CM

## 2012-09-25 DIAGNOSIS — M549 Dorsalgia, unspecified: Secondary | ICD-10-CM

## 2012-09-25 DIAGNOSIS — I10 Essential (primary) hypertension: Secondary | ICD-10-CM

## 2012-09-25 LAB — BASIC METABOLIC PANEL WITH GFR
BUN: 30 mg/dL — ABNORMAL HIGH (ref 6–23)
CO2: 23 mEq/L (ref 19–32)
Calcium: 9.8 mg/dL (ref 8.4–10.5)
Chloride: 105 mEq/L (ref 96–112)
Creat: 1.93 mg/dL — ABNORMAL HIGH (ref 0.50–1.35)
GFR, Est African American: 42 mL/min — ABNORMAL LOW
GFR, Est Non African American: 36 mL/min — ABNORMAL LOW
Glucose, Bld: 95 mg/dL (ref 70–99)
Potassium: 4 mEq/L (ref 3.5–5.3)
Sodium: 140 mEq/L (ref 135–145)

## 2012-09-25 MED ORDER — TRAMADOL HCL 50 MG PO TABS: 50.0000 mg | ORAL_TABLET | Freq: Two times a day (BID) | ORAL | Status: DC | PRN

## 2012-09-25 NOTE — Patient Instructions (Addendum)
General Instructions: Follow up in 1 month for back pain Try to quit smoking and think about the colonoscopy Read information below  Get your pulmonary function tests done  Pick up your medications from the pharmacy    Treatment Goals:  Goals (1 Years of Data) as of 09/25/12         As of Today 05/22/12 05/22/12 03/28/12 12/07/11     Blood Pressure    . Blood Pressure < 140/90  116/67 141/74 149/79 153/76 153/82      Progress Toward Treatment Goals:  Treatment Goal 09/25/2012  Blood pressure at goal  Stop smoking smoking the same amount    Self Care Goals & Plans:  Self Care Goal 09/25/2012  Manage my medications take my medicines as prescribed; bring my medications to every visit; refill my medications on time  Monitor my health keep track of my blood pressure  Eat healthy foods drink diet soda or water instead of juice or soda; eat more vegetables; eat foods that are low in salt; eat baked foods instead of fried foods; eat fruit for snacks and desserts; eat smaller portions  Be physically active find an activity I enjoy  Stop smoking call [facility] (1-800-QUIT-NOW)  Meeting treatment goals maintain the current self-care plan       Care Management & Community Referrals:  Referral 09/25/2012  Referrals made for care management support none needed  Referrals made to community resources none       Anxiety -->[facility] walk in hours during the week  Anxiety is your body's way of reacting to real danger or something you think is a danger. It may be fear or worry over a situation like losing your job. Sometimes the cause is not known. A panic attack is made up of physical signs like sweating, shaking, or chest pain. Anxiety and panic attacks may start suddenly. They may be strong. They may come at any time of day, even while sleeping. They may come at any time of life. Panic attacks are scary, but they do not harm you physically.  HOME CARE  Avoid any known causes of your  anxiety.  Try to relax. Yoga may help. Tell yourself everything will be okay.  Exercise often.  Get expert advice and help (therapy) to stop anxiety or attacks from happening.  Avoid caffeine, alcohol, and drugs.  Only take medicine as told by your doctor. GET HELP RIGHT AWAY IF:  Your attacks seem different than normal attacks.  Your problems are getting worse or concern you. MAKE SURE YOU:  Understand these instructions.  Will watch your condition.  Will get help right away if you are not doing well or get worse. Document Released: 03/24/2010 Document Revised: 05/14/2011 Document Reviewed: 03/24/2010 [facility] Patient Information 2014 [place], [place].  Tramadol tablets What is this medicine? TRAMADOL (TRA ma dole) is a pain reliever. It is used to treat moderate to severe pain in adults. This medicine may be used for other purposes; ask your health care provider or pharmacist if you have questions. What should I tell my health care provider before I take this medicine? They need to know if you have any of these conditions: -brain tumor -depression -drug abuse or addiction -head injury -if you frequently drink alcohol containing drinks -kidney disease or trouble passing urine -liver disease -lung disease, asthma, or breathing problems -seizures or epilepsy -suicidal thoughts, plans, or attempt; a previous suicide attempt by you or a family member -an unusual or allergic reaction to tramadol,  codeine, other medicines, foods, dyes, or preservatives -pregnant or trying to get pregnant -breast-feeding How should I use this medicine? Take this medicine by mouth with a full glass of water. Follow the directions on the prescription label. If the medicine upsets your stomach, take it with food or milk. Do not take more medicine than you are told to take. Talk to your pediatrician regarding the use of this medicine in children. Special care may be needed. Overdosage: If you  think you have taken too much of this medicine contact a poison control center or emergency room at once. NOTE: This medicine is only for you. Do not share this medicine with others. What if I miss a dose? If you miss a dose, take it as soon as you can. If it is almost time for your next dose, take only that dose. Do not take double or extra doses. What may interact with this medicine? Do not take this medicine with any of the following medications: -MAOIs like Carbex, Eldepryl, Marplan, Nardil, and Parnate This medicine may also interact with the following medications: -alcohol or medicines that contain alcohol -antihistamines -benzodiazepines -bupropion -carbamazepine or oxcarbazepine -clozapine -cyclobenzaprine -digoxin -furazolidone -linezolid -medicines for depression, anxiety, or psychotic disturbances -medicines for migraine headache like almotriptan, eletriptan, frovatriptan, naratriptan, rizatriptan, sumatriptan, zolmitriptan -medicines for pain like pentazocine, buprenorphine, butorphanol, meperidine, nalbuphine, and propoxyphene -medicines for sleep -muscle relaxants -naltrexone -phenobarbital -phenothiazines like perphenazine, thioridazine, chlorpromazine, mesoridazine, fluphenazine, prochlorperazine, promazine, and trifluoperazine -procarbazine -warfarin This list may not describe all possible interactions. Give your health care provider a list of all the medicines, herbs, non-prescription drugs, or dietary supplements you use. Also tell them if you smoke, drink alcohol, or use illegal drugs. Some items may interact with your medicine. What should I watch for while using this medicine? Tell your doctor or health care professional if your pain does not go away, if it gets worse, or if you have new or a different type of pain. You may develop tolerance to the medicine. Tolerance means that you will need a higher dose of the medicine for pain relief. Tolerance is normal and is  expected if you take this medicine for a long time. Do not suddenly stop taking your medicine because you may develop a severe reaction. Your body becomes used to the medicine. This does NOT mean you are addicted. Addiction is a behavior related to getting and using a drug for a non-medical reason. If you have pain, you have a medical reason to take pain medicine. Your doctor will tell you how much medicine to take. If your doctor wants you to stop the medicine, the dose will be slowly lowered over time to avoid any side effects. You may get drowsy or dizzy. Do not drive, use machinery, or do anything that needs mental alertness until you know how this medicine affects you. Do not stand or sit up quickly, especially if you are an older patient. This reduces the risk of dizzy or fainting spells. Alcohol can increase or decrease the effects of this medicine. Avoid alcoholic drinks. You may have constipation. Try to have a bowel movement at least every 2 to 3 days. If you do not have a bowel movement for 3 days, call your doctor or health care professional. Your mouth may get dry. Chewing sugarless gum or sucking hard candy, and drinking plenty of water may help. Contact your doctor if the problem does not go away or is severe. What side effects may I notice from  receiving this medicine? Side effects that you should report to your doctor or health care professional as soon as possible: -allergic reactions like skin rash, itching or hives, swelling of the face, lips, or tongue -breathing difficulties, wheezing -confusion -itching -light headedness or fainting spells -redness, blistering, peeling or loosening of the skin, including inside the mouth -seizures Side effects that usually do not require medical attention (report to your doctor or health care professional if they continue or are bothersome): -constipation -dizziness -drowsiness -headache -nausea, vomiting This list may not describe all  possible side effects. Call your doctor for medical advice about side effects. You may report side effects to FDA at 1-800-FDA-1088. Where should I keep my medicine? Keep out of the reach of children. Store at room temperature between 15 and 30 degrees C (59 and 86 degrees F). Keep container tightly closed. Throw away any unused medicine after the expiration date. NOTE: This sheet is a summary. It may not cover all possible information. If you have questions about this medicine, talk to your doctor, pharmacist, or health care provider.  2012, [employer]. (11/02/2009 11:55:44 AM) Back Pain, Adult Low back pain is very common. About 1 in 5 people have back pain.The cause of low back pain is rarely dangerous. The pain often gets better over time.About half of people with a sudden onset of back pain feel better in just 2 weeks. About 8 in 10 people feel better by 6 weeks.  CAUSES Some common causes of back pain include:  Strain of the muscles or ligaments supporting the spine.  Wear and tear (degeneration) of the spinal discs.  Arthritis.  Direct injury to the back. DIAGNOSIS Most of the time, the direct cause of low back pain is not known.However, back pain can be treated effectively even when the exact cause of the pain is unknown.Answering your caregiver's questions about your overall health and symptoms is one of the most accurate ways to make sure the cause of your pain is not dangerous. If your caregiver needs more information, he or she may order lab work or imaging tests (X-rays or MRIs).However, even if imaging tests show changes in your back, this usually does not require surgery. HOME CARE INSTRUCTIONS For many people, back pain returns.Since low back pain is rarely dangerous, it is often a condition that people can learn to [facility] their own.   Remain active. It is stressful on the back to sit or stand in one place. Do not sit, drive, or stand in one place for more  than 30 minutes at a time. Take short walks on level surfaces as soon as pain allows.Try to increase the length of time you walk each day.  Do not stay in bed.Resting more than 1 or 2 days can delay your recovery.  Do not avoid exercise or work.Your body is made to move.It is not dangerous to be active, even though your back may hurt.Your back will likely heal faster if you return to being active before your pain is gone.  Pay attention to your body when you bend and lift. Many people have less discomfortwhen lifting if they bend their knees, keep the load close to their bodies,and avoid twisting. Often, the most comfortable positions are those that put less stress on your recovering back.  Find a comfortable position to sleep. Use a firm mattress and lie on your side with your knees slightly bent. If you lie on your back, put a pillow under your knees.  Only take  over-the-counter or prescription medicines as directed by your caregiver. Over-the-counter medicines to reduce pain and inflammation are often the most helpful.Your caregiver may prescribe muscle relaxant drugs.These medicines help dull your pain so you can more quickly return to your normal activities and healthy exercise.  Put ice on the injured area.  Put ice in a plastic bag.  Place a towel between your skin and the bag.  Leave the ice on for 15-20 minutes, 3-4 times a day for the first 2 to 3 days. After that, ice and heat may be alternated to reduce pain and spasms.  Ask your caregiver about trying back exercises and gentle massage. This may be of some benefit.  Avoid feeling anxious or stressed.Stress increases muscle tension and can worsen back pain.It is important to recognize when you are anxious or stressed and learn ways to manage it.Exercise is a great option. SEEK MEDICAL CARE IF:  You have pain that is not relieved with rest or medicine.  You have pain that does not improve in 1 week.  You have new  symptoms.  You are generally not feeling well. SEEK IMMEDIATE MEDICAL CARE IF:   You have pain that radiates from your back into your legs.  You develop new bowel or bladder control problems.  You have unusual weakness or numbness in your arms or legs.  You develop nausea or vomiting.  You develop abdominal pain.  You feel faint. Document Released: 02/19/2005 Document Revised: 08/21/2011 Document Reviewed: 07/10/2010 [facility] Patient Information 2014 [place], [place].  Smoking Cessation Quitting smoking is important to your health and has many advantages. However, it is not always easy to quit since nicotine is a very addictive drug. Often times, people try 3 times or more before being able to quit. This document explains the best ways for you to prepare to quit smoking. Quitting takes hard work and a lot of effort, but you can do it. ADVANTAGES OF QUITTING SMOKING  You will live longer, feel better, and live better.  Your body will feel the impact of quitting smoking almost immediately.  Within 20 minutes, blood pressure decreases. Your pulse returns to its normal level.  After 8 hours, carbon monoxide levels in the blood return to normal. Your oxygen level increases.  After 24 hours, the chance of having a heart attack starts to decrease. Your breath, hair, and body stop smelling like smoke.  After 48 hours, damaged nerve endings begin to recover. Your sense of taste and smell improve.  After 72 hours, the body is virtually free of nicotine. Your bronchial tubes relax and breathing becomes easier.  After 2 to 12 weeks, lungs can hold more air. Exercise becomes easier and circulation improves.  The risk of having a heart attack, stroke, cancer, or lung disease is greatly reduced.  After 1 year, the risk of coronary heart disease is cut in half.  After 5 years, the risk of stroke falls to the same as a nonsmoker.  After 10 years, the risk of lung cancer is cut in half and  the risk of other cancers decreases significantly.  After 15 years, the risk of coronary heart disease drops, usually to the level of a nonsmoker.  If you are pregnant, quitting smoking will improve your chances of having a healthy baby.  The people you live with, especially any children, will be healthier.  You will have extra money to spend on things other than cigarettes. QUESTIONS TO THINK ABOUT BEFORE ATTEMPTING TO QUIT You may want  to talk about your answers with your caregiver.  Why do you want to quit?  If you tried to quit in the past, what helped and what did not?  What will be the most difficult situations for you after you quit? How will you plan to handle them?  Who can help you through the tough times? Your family? Friends? A caregiver?  What pleasures do you get from smoking? What ways can you still get pleasure if you quit? Here are some questions to ask your caregiver:  How can you help me to be successful at quitting?  What medicine do you think would be best for me and how should I take it?  What should I do if I need more help?  What is smoking withdrawal like? How can I get information on withdrawal? GET READY  Set a quit date.  Change your environment by getting rid of all cigarettes, ashtrays, matches, and lighters in your home, car, or work. Do not let people smoke in your home.  Review your past attempts to quit. Think about what worked and what did not. GET SUPPORT AND ENCOURAGEMENT You have a better chance of being successful if you have help. You can get support in many ways.  Tell your family, friends, and co-workers that you are going to quit and need their support. Ask them not to smoke around you.  Get individual, group, or telephone counseling and support. Programs are available at [employer] and health centers. Call your local health department for information about programs in your area.  Spiritual beliefs and practices may help some  smokers quit.  Download a "quit meter" on your computer to keep track of quit statistics, such as how long you have gone without smoking, cigarettes not smoked, and money saved.  Get a self-help book about quitting smoking and staying off of tobacco. [place] yourself from urges to smoke. Talk to someone, go for a walk, or occupy your time with a task.  Change your normal routine. Take a different route to work. Drink tea instead of coffee. Eat breakfast in a different place.  Reduce your stress. Take a hot bath, exercise, or read a book.  Plan something enjoyable to do every day. Reward yourself for not smoking.  Explore interactive web-based programs that specialize in helping you quit. GET MEDICINE AND USE IT CORRECTLY Medicines can help you stop smoking and decrease the urge to smoke. Combining medicine with the above behavioral methods and support can greatly increase your chances of successfully quitting smoking.  Nicotine replacement therapy helps deliver nicotine to your body without the negative effects and risks of smoking. Nicotine replacement therapy includes nicotine gum, lozenges, inhalers, nasal sprays, and skin patches. Some may be available over-the-counter and others require a prescription.  Antidepressant medicine helps people abstain from smoking, but how this works is unknown. This medicine is available by prescription.  Nicotinic receptor partial agonist medicine simulates the effect of nicotine in your brain. This medicine is available by prescription. Ask your caregiver for advice about which medicines to use and how to use them based on your health history. Your caregiver will tell you what side effects to look out for if you choose to be on a medicine or therapy. Carefully read the information on the package. Do not use any other product containing nicotine while using a nicotine replacement product.  RELAPSE OR DIFFICULT  SITUATIONS Most relapses occur within the first 3 months  after quitting. Do not be discouraged if you start smoking again. Remember, most people try several times before finally quitting. You may have symptoms of withdrawal because your body is used to nicotine. You may crave cigarettes, be irritable, feel very hungry, cough often, get headaches, or have difficulty concentrating. The withdrawal symptoms are only temporary. They are strongest when you first quit, but they will go away within 10 14 days. To reduce the chances of relapse, try to:  Avoid drinking alcohol. Drinking lowers your chances of successfully quitting.  Reduce the amount of caffeine you consume. Once you quit smoking, the amount of caffeine in your body increases and can give you symptoms, such as a rapid heartbeat, sweating, and anxiety.  Avoid smokers because they can make you want to smoke.  Do not let weight gain distract you. Many smokers will gain weight when they quit, usually less than 10 pounds. Eat a healthy diet and stay active. You can always lose the weight gained after you quit.  Find ways to improve your mood other than smoking. FOR MORE INFORMATION  www.smokefree.gov  Document Released: 02/13/2001 Document Revised: 08/21/2011 Document Reviewed: 05/31/2011 [facility] Patient Information 2014 [place], [place].

## 2012-09-26 ENCOUNTER — Encounter: Payer: Self-pay | Admitting: Internal Medicine

## 2012-09-26 ENCOUNTER — Telehealth: Payer: Self-pay | Admitting: *Deleted

## 2012-09-26 DIAGNOSIS — M549 Dorsalgia, unspecified: Secondary | ICD-10-CM | POA: Insufficient documentation

## 2012-09-26 DIAGNOSIS — M545 Low back pain, unspecified: Secondary | ICD-10-CM | POA: Insufficient documentation

## 2012-09-26 MED ORDER — HYDRALAZINE HCL 50 MG PO TABS: 50.0000 mg | ORAL_TABLET | Freq: Two times a day (BID) | ORAL | Status: DC

## 2012-09-26 MED ORDER — FUROSEMIDE 40 MG PO TABS: ORAL_TABLET | ORAL | Status: DC

## 2012-09-26 MED ORDER — CLONIDINE HCL 0.1 MG PO TABS: 0.1000 mg | ORAL_TABLET | Freq: Two times a day (BID) | ORAL | Status: DC

## 2012-09-26 MED ORDER — OMEPRAZOLE 40 MG PO CPDR: 40.0000 mg | DELAYED_RELEASE_CAPSULE | Freq: Every day | ORAL | Status: DC

## 2012-09-26 MED ORDER — AMLODIPINE BESYLATE 10 MG PO TABS: 10.0000 mg | ORAL_TABLET | Freq: Every day | ORAL | Status: DC

## 2012-09-26 NOTE — Assessment & Plan Note (Signed)
  Assessment: Progress toward smoking cessation:  smoking the same amount Barriers to progress toward smoking cessation:  none Comments: smoking 2 ppd smoking since age 62  Plan: Instruction/counseling given:  I counseled patient on the dangers of tobacco use, advised patient to stop smoking, and reviewed strategies to maximize success. Educational resources provided:  [facility] (1-800-QUIT-NOW) brochure Self management tools provided: none. Patient no longer taking Chantix Medications to assist with smoking cessation:  None Patient agreed to the following self-care plans for smoking cessation: call [facility] (1-800-QUIT-NOW)  Other plans: pfts scheduled

## 2012-09-26 NOTE — Assessment & Plan Note (Signed)
Lipid Panel     Component Value Date/Time   CHOL 165 12/07/2011 1536   TRIG 110 12/07/2011 1536   HDL 42 12/07/2011 1536   CHOLHDL 3.9 12/07/2011 1536   VLDL 22 12/07/2011 1536   LDLCALC 101* 12/07/2011 1536   Continue Pravastatin 40 mg

## 2012-09-26 NOTE — Progress Notes (Signed)
  Subjective:    Patient ID: William Perkins, male    DOB: April 27, 1950, 62 y.o.   MRN: [id number]  HPI Comments: 62 y.o PMH HTN (BP 116/67), HLD (LDL 101 12/2011), tobacco abuse (smoking since age 15 now smoking 2 ppd), GERD, constipation.   He complains of right mid to lower back pain which started last weekend and is intermittently present but not currently.  He denies any heavy lifting and works at [employer].  He has been taking 1 pain aid (Tylenol 110 mg, Aspirin 162 mg, Caffeine [id number] mg and Salicylamide).  Pain is worse with exhalation.  He reports tingling sensation in his back as well with sensation of it being hot/burning that is worse with air conditioning.  Review of prior imaging of his back shows degenerative disc disease in C spine and b/l neural foraminal narrowing C3-4 and C4-5.    SIGE CAPS depression question: Sleep normal, only interested in watching TV, feels guilty sometimes, energy normal, concentration normal, appetite normal, denies suicidal ideation.   He needs a medication refill of Lasix, Norvasc, hydralazine, Clonidine, and Omeprazole.    Patient states he went to the dentist and needs all of his teeth pulled for $5K  Health maintenance: he declines colonoscopy today  SH: married x 21 years, denies EtOH      Review of Systems  Constitutional: Negative for fever and chills.  Respiratory: Negative for shortness of breath.   Cardiovascular: Negative for chest pain.  Psychiatric/Behavioral: Positive for agitation.       +anxiety +stress worried about bils       Objective:   Physical Exam  Nursing note and vitals reviewed. Constitutional: He is oriented to person, place, and time. Vital signs are normal. He appears well-developed and well-nourished. He is cooperative.  HENT:  Head: Normocephalic and atraumatic.  Mouth/Throat: Oropharynx is clear and moist and mucous membranes are normal. Abnormal dentition. No oropharyngeal exudate.  Eyes: Conjunctivae are  normal. Pupils are equal, round, and reactive to light. Right eye exhibits no discharge. Left eye exhibits no discharge. No scleral icterus.  Cardiovascular: Normal rate, regular rhythm, S1 normal, S2 normal and normal heart sounds.   No murmur heard. Pulmonary/Chest: Effort normal and breath sounds normal.    Abdominal: Soft. Bowel sounds are normal. He exhibits no distension. There is no tenderness.  Musculoskeletal: He exhibits no edema.  No ttp along cervical, thoracic, lumbar spine  Neurological: He is alert and oriented to person, place, and time. He has normal strength. Gait normal.  Skin: Skin is warm, dry and intact. No rash noted.  Psychiatric: He has a normal mood and affect. His speech is normal and behavior is normal. Judgment and thought content normal. Cognition and memory are normal.          Assessment & Plan:  F/u 1 month back pain

## 2012-09-26 NOTE — Assessment & Plan Note (Signed)
Patient repeatedly refusing colonoscopy

## 2012-09-26 NOTE — Telephone Encounter (Signed)
Call to pt to give appointment for PFT's.  Message left for pt to call the Clinics.  RTC from pt given appointment for 10/10/2012 at 3:30 PM to go to [facility] by 3:15 PM to register.  Pt voiced understanding of the instructions and will go to Admitting in the [facility] by 3:15 PM.  Sander Nephew, RN 09/26/2012 12:04 PM.

## 2012-09-26 NOTE — Assessment & Plan Note (Signed)
BP Readings from Last 3 Encounters:  09/25/12 116/67  05/22/12 141/74  03/28/12 153/76    Lab Results  Component Value Date   NA 140 09/25/2012   K 4.0 09/25/2012   CREATININE 1.93* 09/25/2012    Assessment: Blood pressure control: controlled Progress toward BP goal:  at goal Comments: elevated Creatinine from baseline 1.6-1.8.   Plan: Medications:  continue current medications Educational resources provided: brochure Self management tools provided: other (see comments) (handout) Other plans: repeat BMET at follow up, consider repeat renal US.  Prior renal US with bilateral increase in renal cortical echogenicity consistent with medical renal disease. Probable tiny cyst at upper pole right kidney, slightly increased in size since 03/17/2006 when it measured 6 x 7 x 7 mm.

## 2012-09-26 NOTE — Assessment & Plan Note (Signed)
Will repeat BMET in the future and consider repeat renal US from prior 07/2011 which showed Bilateral increase in renal cortical echogenicity consistent with medical renal disease. Probable tiny cyst at upper pole right kidney, slightly increased in size since 03/17/2006 when it measured 6 x 7 x 7 mm.

## 2012-09-26 NOTE — Assessment & Plan Note (Signed)
Etiology could be mechanical, MSK, structural as patient has a history of DDD cervical spine in the past.   Will try Ultram 50 mg 1-2 times daily as needed #30 no refills Return in 1 month to assess  May require imaging in the future

## 2012-09-27 ENCOUNTER — Encounter: Payer: Self-pay | Admitting: Internal Medicine

## 2012-09-29 NOTE — Progress Notes (Signed)
Case discussed with Dr. McLean at the time of the visit.  We reviewed the resident's history and exam and pertinent patient test results.  I agree with the assessment, diagnosis, and plan of care documented in the resident's note.     

## 2012-10-03 ENCOUNTER — Ambulatory Visit (HOSPITAL_COMMUNITY)
Admission: RE | Admit: 2012-10-03 | Discharge: 2012-10-03 | Disposition: A | Discharge status: Home / Self Care | Payer: BC Managed Care – PPO | Source: Ambulatory Visit | Attending: Internal Medicine | Admitting: Internal Medicine

## 2012-10-03 DIAGNOSIS — F17200 Nicotine dependence, unspecified, uncomplicated: Secondary | ICD-10-CM | POA: Insufficient documentation

## 2012-10-03 MED ORDER — ALBUTEROL SULFATE (5 MG/ML) 0.5% IN NEBU
2.5000 mg | INHALATION_SOLUTION | Freq: Once | RESPIRATORY_TRACT | Status: AC
  Administered 2012-10-03: 2.5 mg via RESPIRATORY_TRACT

## 2012-10-10 ENCOUNTER — Encounter (HOSPITAL_COMMUNITY): Payer: BC Managed Care – PPO

## 2012-10-30 ENCOUNTER — Encounter: Payer: BC Managed Care – PPO | Admitting: Internal Medicine

## 2012-10-30 ENCOUNTER — Encounter: Payer: Self-pay | Admitting: Internal Medicine

## 2012-11-05 ENCOUNTER — Encounter: Payer: Self-pay | Admitting: Internal Medicine

## 2013-04-02 ENCOUNTER — Other Ambulatory Visit: Payer: Self-pay | Admitting: Internal Medicine

## 2013-04-03 ENCOUNTER — Other Ambulatory Visit: Payer: Self-pay | Admitting: Internal Medicine

## 2013-04-21 ENCOUNTER — Other Ambulatory Visit: Payer: Self-pay | Admitting: Internal Medicine

## 2013-05-14 ENCOUNTER — Other Ambulatory Visit: Payer: Self-pay | Admitting: Internal Medicine

## 2013-06-17 ENCOUNTER — Encounter: Payer: Self-pay | Admitting: Internal Medicine

## 2013-06-30 ENCOUNTER — Other Ambulatory Visit: Payer: Self-pay | Admitting: Internal Medicine

## 2013-07-01 ENCOUNTER — Other Ambulatory Visit: Payer: Self-pay | Admitting: *Deleted

## 2013-07-01 DIAGNOSIS — E785 Hyperlipidemia, unspecified: Secondary | ICD-10-CM

## 2013-07-01 MED ORDER — CLONIDINE HCL 0.1 MG PO TABS: ORAL_TABLET | ORAL | Status: DC

## 2013-07-01 MED ORDER — LISINOPRIL 40 MG PO TABS: ORAL_TABLET | ORAL | Status: DC

## 2013-07-01 MED ORDER — HYDRALAZINE HCL 50 MG PO TABS: 50.0000 mg | ORAL_TABLET | Freq: Two times a day (BID) | ORAL | Status: DC

## 2013-07-01 MED ORDER — FUROSEMIDE 40 MG PO TABS: ORAL_TABLET | ORAL | Status: DC

## 2013-07-01 MED ORDER — PRAVASTATIN SODIUM 40 MG PO TABS: 40.0000 mg | ORAL_TABLET | Freq: Every day | ORAL | Status: DC

## 2013-07-01 MED ORDER — OMEPRAZOLE 40 MG PO CPDR: DELAYED_RELEASE_CAPSULE | ORAL | Status: DC

## 2013-07-01 MED ORDER — AMLODIPINE BESYLATE 10 MG PO TABS: ORAL_TABLET | ORAL | Status: DC

## 2013-07-06 ENCOUNTER — Ambulatory Visit (INDEPENDENT_AMBULATORY_CARE_PROVIDER_SITE_OTHER): Payer: BC Managed Care – PPO | Admitting: Internal Medicine

## 2013-07-06 ENCOUNTER — Encounter: Payer: Self-pay | Admitting: Internal Medicine

## 2013-07-06 VITALS — BP 133/77 | HR 57 | Temp 97.6°F | Wt 145.5 lb

## 2013-07-06 DIAGNOSIS — R739 Hyperglycemia, unspecified: Secondary | ICD-10-CM

## 2013-07-06 DIAGNOSIS — R05 Cough: Secondary | ICD-10-CM

## 2013-07-06 DIAGNOSIS — J069 Acute upper respiratory infection, unspecified: Secondary | ICD-10-CM

## 2013-07-06 DIAGNOSIS — R059 Cough, unspecified: Secondary | ICD-10-CM | POA: Insufficient documentation

## 2013-07-06 DIAGNOSIS — N039 Chronic nephritic syndrome with unspecified morphologic changes: Secondary | ICD-10-CM

## 2013-07-06 DIAGNOSIS — N189 Chronic kidney disease, unspecified: Secondary | ICD-10-CM

## 2013-07-06 DIAGNOSIS — E785 Hyperlipidemia, unspecified: Secondary | ICD-10-CM

## 2013-07-06 DIAGNOSIS — Z0000 Encounter for general adult medical examination without abnormal findings: Secondary | ICD-10-CM

## 2013-07-06 DIAGNOSIS — N281 Cyst of kidney, acquired: Secondary | ICD-10-CM

## 2013-07-06 DIAGNOSIS — I129 Hypertensive chronic kidney disease with stage 1 through stage 4 chronic kidney disease, or unspecified chronic kidney disease: Secondary | ICD-10-CM

## 2013-07-06 DIAGNOSIS — R7309 Other abnormal glucose: Secondary | ICD-10-CM

## 2013-07-06 DIAGNOSIS — D631 Anemia in chronic kidney disease: Secondary | ICD-10-CM

## 2013-07-06 DIAGNOSIS — I10 Essential (primary) hypertension: Secondary | ICD-10-CM

## 2013-07-06 LAB — CBC WITH DIFFERENTIAL/PLATELET
Basophils Absolute: 0.1 10*3/uL (ref 0.0–0.1)
Basophils Relative: 1 % (ref 0–1)
Eosinophils Absolute: 0 10*3/uL (ref 0.0–0.7)
Eosinophils Relative: 0 % (ref 0–5)
HCT: 38.9 % — ABNORMAL LOW (ref 39.0–52.0)
Hemoglobin: 13 g/dL (ref 13.0–17.0)
Lymphocytes Relative: 18 % (ref 12–46)
Lymphs Abs: 1.1 10*3/uL (ref 0.7–4.0)
MCH: 29.2 pg (ref 26.0–34.0)
MCHC: 33.4 g/dL (ref 30.0–36.0)
MCV: 87.4 fL (ref 78.0–100.0)
Monocytes Absolute: 1 10*3/uL (ref 0.1–1.0)
Monocytes Relative: 17 % — ABNORMAL HIGH (ref 3–12)
Neutro Abs: 3.9 10*3/uL (ref 1.7–7.7)
Neutrophils Relative %: 64 % (ref 43–77)
Platelets: 251 10*3/uL (ref 150–400)
RBC: 4.45 MIL/uL (ref 4.22–5.81)
RDW: 14.3 % (ref 11.5–15.5)
WBC: 6.1 10*3/uL (ref 4.0–10.5)

## 2013-07-06 LAB — GLUCOSE, CAPILLARY: Glucose-Capillary: 92 mg/dL (ref 70–99)

## 2013-07-06 LAB — POCT GLYCOSYLATED HEMOGLOBIN (HGB A1C): Hemoglobin A1C: 5

## 2013-07-06 MED ORDER — DIPHENHYDRAMINE HCL 25 MG PO CAPS: 25.0000 mg | ORAL_CAPSULE | Freq: Four times a day (QID) | ORAL | Status: DC | PRN

## 2013-07-06 MED ORDER — PROMETHAZINE HCL 12.5 MG PO TABS: 12.5000 mg | ORAL_TABLET | Freq: Four times a day (QID) | ORAL | Status: DC | PRN

## 2013-07-06 MED ORDER — GUAIFENESIN ER 600 MG PO TB12: 600.0000 mg | ORAL_TABLET | Freq: Two times a day (BID) | ORAL | Status: DC

## 2013-07-06 NOTE — Patient Instructions (Signed)
Take all the medications as recommended below. New medications are sent to your pharmacy. Pick up the prescription from the pharmacy and start taking the medications today. The new medications can make you drowsy, so do not drive while you are taking these medications. If your symptoms do not improve in 10 days or worsen, please call us or seek medical help. Follow up with your regular doctor as scheduled.

## 2013-07-06 NOTE — Assessment & Plan Note (Signed)
Differentials include Upper respiratory viral illness vs Allergic pharyngitis vs Acute Bronchitis  Discussed with the attending Dr. Ellwood Dense regarding further management and plans.  Plans: Symptomatic management. Guaifenesin, prn phenergan, benadryl. Counseled patient regarding tobacco cessation. Patient does not seem to be interested in stopping smoking. Recommended to follow up if symptoms persist or worsen.

## 2013-07-06 NOTE — Assessment & Plan Note (Signed)
In the setting of CKD. Well controlled.  Plans: Continue current medications.

## 2013-07-06 NOTE — Assessment & Plan Note (Signed)
Renal US consistent with medical renal disease.  Plans: Check CMP Check renal US to monitor the renal cyst size that was noted on the prior renal US.

## 2013-07-06 NOTE — Progress Notes (Signed)
Subjective:   Patient ID: Jadir Ralph male   DOB: 03/06/1950 63 y.o.   MRN: [id number]  HPI: Mr.Wai Mcmoore is a 63 y.o. gentleman with PMH significant for HTN, CKD, HLD comes to the office with CC of "feeling sick" for the last 2 days.  Patient reports that he has been feeling sick for the last 2 days. He reports cough with mucoid sputum, chills, and occasional nausea. He denies any fever, body pains, headaches. He reports feeling weak and tired and not having energy to do his activities. He reports decreased appetite. He denies any vomiting, diarrhea, AP, SOB, chest pain, sore throat. He denies any other complaints. Patient reports that his wife has been sick (cough and fever) over the last one week and went to the Urgent care today. He thinks he might have gotten it from his wife.   He denies any other complaints.  He reports that his sisters, mother and a lot of his family members are diabetic and wants to check if he has diabetes.    Past Medical History  Diagnosis Date  . Hypertension   . Left ventricular hypertrophy   . Renal insufficiency   . Leg pain, left 11/15/2009  . Hand numbness 10/07/2009  . Soft tissue disorder 08/2008  . Hypertension 03/22/2006  . Hyperlipidemia 03/22/2008  . Chronic renal insufficiency 03/21/2006    CrCl <50 ml  . Left ventricular hypertrophy 03/22/2006  . Tobacco abuse 03/22/2006    2 ppd x 30 years  . GERD (gastroesophageal reflux disease) 03/22/2006  . Lumbar strain 07/15/2008   Current Outpatient Prescriptions  Medication Sig Dispense Refill  . amLODipine (NORVASC) 10 MG tablet TAKE ONE TABLET BY MOUTH ONCE DAILY  30 tablet  2  . cloNIDine (CATAPRES) 0.1 MG tablet TAKE ONE TABLET BY MOUTH TWICE DAILY  60 tablet  2        . furosemide (LASIX) 40 MG tablet TAKE ONE-HALF TABLET BY MOUTH ONCE DAILY  30 tablet  2        . hydrALAZINE (APRESOLINE) 50 MG tablet Take 1 tablet (50 mg total) by mouth 2 (two) times daily.  60 tablet  2  .  lisinopril (PRINIVIL,ZESTRIL) 40 MG tablet TAKE one TABLET BY MOUTH ONCE DAILY  30 tablet  2  . omeprazole (PRILOSEC) 40 MG capsule TAKE ONE CAPSULE BY MOUTH ONCE DAILY  30 capsule  1  . polyethylene glycol (MIRALAX) packet Take 17 g by mouth daily.  14 each  0  . pravastatin (PRAVACHOL) 40 MG tablet TAKE ONE TABLET BY MOUTH EVERY DAY  30 tablet  0        . Psyllium (METAMUCIL) 30.9 % POWD Take 1 tablespoon and mix water 2-3 times a day for constipation       . [DISCONTINUED] lisinopril-hydrochlorothiazide (PRINZIDE,ZESTORETIC) 20-12.5 MG per tablet Take 2 tablets by mouth daily.       No current facility-administered medications for this visit.   Family History  Problem Relation Age of Onset  . Diabetes Mother   . Diabetes Sister   . Diabetes Brother   . Heart disease Brother    History   Social History  . Marital Status: Married    Spouse Name: N/A    Number of Children: N/A  . Years of Education: N/A   Social History Main Topics  . Smoking status: Current Every Day Smoker -- 2.00 packs/day for 44 years    Types: Cigarettes  . Smokeless tobacco: None  .  Alcohol Use: No     Comment: former  . Drug Use: No  . Sexual Activity: None   Other Topics Concern  . None   Social History Narrative  . None   Review of Systems: Pertinent items are noted in HPI. Objective:  Physical Exam: Filed Vitals:   07/06/13 0922  BP: 133/77  Pulse: 57  Temp: 97.6 F (36.4 C)  TempSrc: Oral  Weight: 145 lb 8 oz (65.998 kg)  SpO2: 98%  Constitutional: Vital signs reviewed.   Patient is a well-developed and well-nourished and is in no acute distress and cooperative with exam. Alert and oriented x3.  Head: Normocephalic and atraumatic Ear: TM normal bilaterally. Mild wax noted in both extremities. Nose: No erythema or drainage noted.  Mild enlarged inferior turbinates noted. Mouth: no erythema or exudates, MMM Eyes: PERRL, EOMI, conjunctivae normal, No scleral icterus.  Neck: Supple,  Trachea midline normal ROM, No JVD, mass, thyromegaly, or carotid bruit present.  Cardiovascular: RRR, S1 normal, S2 normal, no MRG, pulses symmetric and intact bilaterally Pulmonary/Chest: normal respiratory effort, CTAB, no wheezes, rales, or rhonchi Musculoskeletal: No joint deformities, erythema, or stiffness, ROM full and no nontender Hematology: no cervical, inginal, or axillary adenopathy.  Neurological: A&O x3, Strength is normal and symmetric bilaterally. Non-focal neuro exam.  Skin: Warm, dry and intact. No rash, cyanosis, or clubbing.  Psychiatric: Normal mood and affect.   Assessment & Plan:

## 2013-07-06 NOTE — Assessment & Plan Note (Signed)
Patient requesting diabetes work up given his strong family history.  Plans: Check CBG, HbA1C.

## 2013-07-06 NOTE — Assessment & Plan Note (Signed)
Check FLP. Plans: Continue pravastatin.

## 2013-07-07 LAB — COMPLETE METABOLIC PANEL WITH GFR
ALT: 11 U/L (ref 0–53)
AST: 21 U/L (ref 0–37)
Albumin: 4 g/dL (ref 3.5–5.2)
Alkaline Phosphatase: 48 U/L (ref 39–117)
BUN: 29 mg/dL — ABNORMAL HIGH (ref 6–23)
CO2: 22 mEq/L (ref 19–32)
Calcium: 9.3 mg/dL (ref 8.4–10.5)
Chloride: 101 mEq/L (ref 96–112)
Creat: 1.61 mg/dL — ABNORMAL HIGH (ref 0.50–1.35)
GFR, Est African American: 52 mL/min — ABNORMAL LOW
GFR, Est Non African American: 45 mL/min — ABNORMAL LOW
Glucose, Bld: 96 mg/dL (ref 70–99)
Potassium: 4 mEq/L (ref 3.5–5.3)
Sodium: 134 mEq/L — ABNORMAL LOW (ref 135–145)
Total Bilirubin: 0.8 mg/dL (ref 0.2–1.2)
Total Protein: 7.2 g/dL (ref 6.0–8.3)

## 2013-07-07 LAB — LIPID PANEL
Cholesterol: 221 mg/dL — ABNORMAL HIGH (ref 0–200)
HDL: 45 mg/dL (ref >39)
LDL Cholesterol: 149 mg/dL — ABNORMAL HIGH (ref 0–99)
Total CHOL/HDL Ratio: 4.9 Ratio
Triglycerides: 137 mg/dL (ref <150)
VLDL: 27 mg/dL (ref 0–40)

## 2013-07-08 ENCOUNTER — Emergency Department (HOSPITAL_COMMUNITY)
Admission: EM | Admit: 2013-07-08 | Discharge: 2013-07-08 | Disposition: A | Discharge status: Home / Self Care | Payer: BC Managed Care – PPO | Attending: Emergency Medicine | Admitting: Emergency Medicine

## 2013-07-08 ENCOUNTER — Emergency Department (HOSPITAL_COMMUNITY): Payer: BC Managed Care – PPO

## 2013-07-08 ENCOUNTER — Other Ambulatory Visit: Payer: Self-pay

## 2013-07-08 ENCOUNTER — Encounter (HOSPITAL_COMMUNITY): Payer: Self-pay | Admitting: Emergency Medicine

## 2013-07-08 DIAGNOSIS — R450 Nervousness: Secondary | ICD-10-CM | POA: Insufficient documentation

## 2013-07-08 DIAGNOSIS — R5381 Other malaise: Secondary | ICD-10-CM | POA: Insufficient documentation

## 2013-07-08 DIAGNOSIS — Z87828 Personal history of other (healed) physical injury and trauma: Secondary | ICD-10-CM | POA: Insufficient documentation

## 2013-07-08 DIAGNOSIS — R05 Cough: Secondary | ICD-10-CM | POA: Insufficient documentation

## 2013-07-08 DIAGNOSIS — Z79899 Other long term (current) drug therapy: Secondary | ICD-10-CM | POA: Insufficient documentation

## 2013-07-08 DIAGNOSIS — R259 Unspecified abnormal involuntary movements: Secondary | ICD-10-CM | POA: Insufficient documentation

## 2013-07-08 DIAGNOSIS — Z8739 Personal history of other diseases of the musculoskeletal system and connective tissue: Secondary | ICD-10-CM | POA: Insufficient documentation

## 2013-07-08 DIAGNOSIS — N189 Chronic kidney disease, unspecified: Secondary | ICD-10-CM | POA: Insufficient documentation

## 2013-07-08 DIAGNOSIS — K219 Gastro-esophageal reflux disease without esophagitis: Secondary | ICD-10-CM | POA: Insufficient documentation

## 2013-07-08 DIAGNOSIS — E785 Hyperlipidemia, unspecified: Secondary | ICD-10-CM | POA: Insufficient documentation

## 2013-07-08 DIAGNOSIS — R5383 Other fatigue: Secondary | ICD-10-CM

## 2013-07-08 DIAGNOSIS — R059 Cough, unspecified: Secondary | ICD-10-CM | POA: Insufficient documentation

## 2013-07-08 DIAGNOSIS — R110 Nausea: Secondary | ICD-10-CM | POA: Insufficient documentation

## 2013-07-08 DIAGNOSIS — F17200 Nicotine dependence, unspecified, uncomplicated: Secondary | ICD-10-CM | POA: Insufficient documentation

## 2013-07-08 DIAGNOSIS — I129 Hypertensive chronic kidney disease with stage 1 through stage 4 chronic kidney disease, or unspecified chronic kidney disease: Secondary | ICD-10-CM | POA: Insufficient documentation

## 2013-07-08 HISTORY — DX: Other cervical disc degeneration, unspecified cervical region: M50.30

## 2013-07-08 LAB — URINALYSIS, ROUTINE W REFLEX MICROSCOPIC
Bilirubin Urine: NEGATIVE
Glucose, UA: NEGATIVE mg/dL
Hgb urine dipstick: NEGATIVE
Ketones, ur: NEGATIVE mg/dL
Leukocytes, UA: NEGATIVE
Nitrite: NEGATIVE
Protein, ur: 30 mg/dL — AB
Specific Gravity, Urine: 1.012 (ref 1.005–1.030)
Urobilinogen, UA: 1 mg/dL (ref 0.0–1.0)
pH: 5.5 (ref 5.0–8.0)

## 2013-07-08 LAB — COMPREHENSIVE METABOLIC PANEL
ALT: 13 U/L (ref 0–53)
AST: 24 U/L (ref 0–37)
Albumin: 3.5 g/dL (ref 3.5–5.2)
Alkaline Phosphatase: 56 U/L (ref 39–117)
BUN: 26 mg/dL — ABNORMAL HIGH (ref 6–23)
CO2: 19 mEq/L (ref 19–32)
Calcium: 9 mg/dL (ref 8.4–10.5)
Chloride: 103 mEq/L (ref 96–112)
Creatinine, Ser: 1.61 mg/dL — ABNORMAL HIGH (ref 0.50–1.35)
GFR calc Af Amer: 51 mL/min — ABNORMAL LOW (ref >90)
GFR calc non Af Amer: 44 mL/min — ABNORMAL LOW (ref >90)
Glucose, Bld: 123 mg/dL — ABNORMAL HIGH (ref 70–99)
Potassium: 3.8 mEq/L (ref 3.7–5.3)
Sodium: 140 mEq/L (ref 137–147)
Total Bilirubin: 0.6 mg/dL (ref 0.3–1.2)
Total Protein: 7.1 g/dL (ref 6.0–8.3)

## 2013-07-08 LAB — CBC WITH DIFFERENTIAL/PLATELET
Basophils Absolute: 0 10*3/uL (ref 0.0–0.1)
Basophils Relative: 1 % (ref 0–1)
Eosinophils Absolute: 0 10*3/uL (ref 0.0–0.7)
Eosinophils Relative: 1 % (ref 0–5)
HCT: 34.6 % — ABNORMAL LOW (ref 39.0–52.0)
Hemoglobin: 12.2 g/dL — ABNORMAL LOW (ref 13.0–17.0)
Lymphocytes Relative: 35 % (ref 12–46)
Lymphs Abs: 1.5 10*3/uL (ref 0.7–4.0)
MCH: 30.4 pg (ref 26.0–34.0)
MCHC: 35.3 g/dL (ref 30.0–36.0)
MCV: 86.3 fL (ref 78.0–100.0)
Monocytes Absolute: 0.7 10*3/uL (ref 0.1–1.0)
Monocytes Relative: 15 % — ABNORMAL HIGH (ref 3–12)
Neutro Abs: 2.1 10*3/uL (ref 1.7–7.7)
Neutrophils Relative %: 48 % (ref 43–77)
Platelets: 212 10*3/uL (ref 150–400)
RBC: 4.01 MIL/uL — ABNORMAL LOW (ref 4.22–5.81)
RDW: 13.3 % (ref 11.5–15.5)
WBC: 4.3 10*3/uL (ref 4.0–10.5)

## 2013-07-08 LAB — TROPONIN I: Troponin I: <0.3 ng/mL (ref <0.30)

## 2013-07-08 LAB — URINE MICROSCOPIC-ADD ON

## 2013-07-08 LAB — LACTIC ACID, PLASMA: Lactic Acid, Venous: 1.3 mmol/L (ref 0.5–2.2)

## 2013-07-08 LAB — LIPASE, BLOOD: Lipase: 29 U/L (ref 11–59)

## 2013-07-08 NOTE — ED Notes (Signed)
Pt has returned from being out of the department; pt placed back on monitor, continuous pulse oximetry and blood pressure cuff; family at bedside 

## 2013-07-08 NOTE — ED Notes (Signed)
Ambulated pt in the hall. Pt remained at 98% Pt stated he felt fine and was ready to go home. Reported to Dr. Thurnell Garbe
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
## 2013-07-08 NOTE — ED Notes (Addendum)
Pt from work with c/o "blood boiling and not feeling good."  States he "can't breathe and is feeling nervous and shaky."  Started this am after arriving to work.  Wife states they have both been on antibiotics for a recent cold.  Pt in NAD, A&O.

## 2013-07-08 NOTE — ED Notes (Signed)
Repeat EKG performed and now pt is attempting at this time to provide an urine specimen; family at bedside

## 2013-07-08 NOTE — Discharge Instructions (Signed)
°Emergency Department Resource Guide °1) Find a Doctor and Pay Out of Pocket °Although you won't have to find out who is covered by your insurance plan, it is a good idea to ask around and get recommendations. You will then need to call the office and see if the doctor you have chosen will accept you as a new patient and what types of options they offer for patients who are self-pay. Some doctors offer discounts or will set up payment plans for their patients who do not have insurance, but you will need to ask so you aren't surprised when you get to your appointment. ° °2) Contact Your Local Health Department °Not all health departments have doctors that can see patients for sick visits, but many do, so it is worth a call to see if yours does. If you don't know where your local health department is, you can check in your phone book. The CDC also has a tool to help you locate your state's health department, and many state websites also have listings of all of their local health departments. ° °3) Find a Walk-in Clinic °If your illness is not likely to be very severe or complicated, you may want to try a walk in clinic. These are popping up all over the country in pharmacies, drugstores, and shopping centers. They're usually staffed by nurse practitioners or physician assistants that have been trained to treat common illnesses and complaints. They're usually fairly quick and inexpensive. However, if you have serious medical issues or chronic medical problems, these are probably not your best option. ° °No Primary Care Doctor: °- Call [facility] at  832-8000 - they can help you locate a primary care doctor that  accepts your insurance, provides certain services, etc. °- Physician Referral Service- 1-800-533-3463 ° °Chronic Pain Problems: °Organization         Address  Phone   Notes  °[facility]  (336) 297-2271 Patients need to be referred by their primary care doctor.  ° °Medication  Assistance: °Organization         Address  Phone   Notes  °[place] [facility] [address] °[place], [place] [address] (336) 641-8030 --Must be a resident of [place] °-- Must have NO insurance coverage whatsoever (no Medicaid/ Medicare, etc.) °-- The pt. MUST have a primary care doctor that directs their care regularly and follows them in the community °  °[facility]  (866) 331-1348   °[employer]  (888) 892-1162   ° °Agencies that provide inexpensive medical care: °Organization         Address  Phone   Notes  °[facility]  (336) 832-8035   °[facility]    (336) 832-7272   °[facility] [address] °[place], [place] [address] (336) 832-4777   °[facility] [address], °[place] (336) 271-4999   °[facility]    (336) 373-0678   °[facility]    (336) 272-1050   °[facility] ° [address], [place] Phone:  (336) 832-4444, Fax:  (336) 832-4440 Hours of Operation:  9 am - 6 pm, M-F.  Also accepts Medicaid/Medicare and self-pay.  °[facility] ° [address], [place] Phone: (336) 832-3150, Fax: (336) 832-3151. Hours of Operation:  8:30 am - 5:30 pm, M-F.  Also accepts Medicaid and self-pay.  °[facility] [place] [address], [place] Phone: (336) 878-6027   °[facility] [address], [place], [place] (336)723-1848, Ext. 123 Mondays & Thursdays: 7-9 AM.  First 15 patients are seen on a first come, first serve basis. °  ° °Medicaid-accepting [place] Providers: ° °Organization         Address  Phone   Notes  °[facility] [address], [place] (336) 641-2100 Also accepts self-pay patients.  °[facility] [address], [place] ° (336) 856-9996   °[facility] [address], [place]  (336) 288-8857   °[facility] [address] [place] [address], [place] (336) 299-7000   °Veita Bland [address], [place]  ° (336) 373-1557 Only accepts [place] Access Medicaid patients after they have their name applied to their card.  ° °Self-Pay (no insurance) in [place]: ° °Organization         Address  Phone   Notes  °Sickle Cell Patients, [facility] [address], [place] (336) 832-1970   °[facility] [address], [place] (336) 832-4400   °[facility] ° [address], [place] (336) 992-4800   °[facility]/Dr. Osei-Bonsu ° [address] [place] [address], [place] or [address], [place] (336) 841-8500 Phone number for both [place] and [place] locations is the same.  °[facility] [address], [place] (336) 299-0000   °[facility] [address] [place] [address], [place] or [address] (336) 852-7530 °(336) 878-2260   °[facility] [address], [place] (336) 350-1642, phone; (336) 294-5005, fax Sees patients 1st and 3rd Saturday of every month.  Must not qualify for public or private insurance (i.e. Medicaid, Medicare, [place] Health Choice, Veterans' Benefits) • Household income should be no more than 200% of the poverty level •The clinic cannot treat you if you are pregnant or think you are pregnant • Sexually transmitted diseases are not treated at the clinic.  ° ° °Dental Care: °Organization         Address  Phone  Notes  °[place] [facility] [address], [place] (336) 641-6152 Accepts children up to age 21 who are enrolled in Medicaid or [place] Health Choice; pregnant women with a Medicaid card; and children who have applied for Medicaid or [place] Health Choice, but were declined, whose parents can pay a reduced fee at time of service.  °[place]  [facility] [place]  [address], [place] (336) 641-7733 Accepts children up to age 21 who are enrolled in Medicaid or [place] Health Choice; pregnant women with a Medicaid card; and children who have applied for Medicaid or [place] Health Choice, but were declined, whose parents can pay a reduced fee at time of service.  °[facility] ° [address], [place] (336) 641-4533 Patients are seen by appointment only. Walk-ins are not accepted. [facility] will see patients 18 years of age and older. °Monday - Tuesday (8am-5pm) °Most Wednesdays (8:30-5pm) °$30 per visit, cash only  °[facility] ° [address], [place] (336) 641-4533 Patients are seen by appointment only. Walk-ins are not accepted. [facility] will see patients 18 years of age and older. °One   Wednesday Evening (Monthly: Volunteer Based).  $30 per visit, cash only  °[facility]  (919) [phone] for adults; Children under age 4, call Graduate Pediatric Dentistry at ([phone]) [phone]. Children aged 4-14, please call (919) [phone] to request a pediatric application. ° Dental services are provided in all areas of dental care including fillings, crowns and bridges, complete and partial dentures, implants, gum treatment, root canals, and extractions. Preventive care is also provided. Treatment is provided to both adults and children. °Patients are selected via a lottery and there is often a waiting list. °  °[facility] [address], °[place] ° (336) 763-8833 www.drcivils.[email or web address] °  °[facility] [address], [place], [place] (336)723-1848, Ext. 123 Second and Fourth Thursday of each month, opens at 6:30 AM; Clinic ends at 9 AM.  Patients are seen on a first-come first-served basis, and a limited number are seen during each clinic.  ° °[facility] ° [address], [place], [place] (336) 723-7904    Eligibility Requirements °You must have lived in [place], [place], or [place] counties for at least the last three months. °  You cannot be eligible for state or federal sponsored healthcare insurance, including Veterans Administration, Medicaid, or Medicare. °  You generally cannot be eligible for healthcare insurance through your employer.  °  How to apply: °Eligibility screenings are held every Tuesday and Wednesday afternoon from 1:00 pm until 4:00 pm. You do not need an appointment for the interview!  °[facility] [address], [place], [place] 336-631-2330   °[facility]  336-342-8273   °[facility]  336-703-3100   °[facility]  336-570-6415   ° °Behavioral Health Resources in the Community: °Intensive Outpatient Programs °Organization         Address  Phone  Notes  °[facility] [address], [place], [place] 336-878-6098   °[facility] [address], [place], [place] 336-832-9800   °[facility] [address], [place], [place] ° 336-882-2125   °[facility] [address],  °[place], [place] 1-800-853-5163 or 336-641-4981   °Substance Abuse Resources °Organization         Address  Phone  Notes  °[facility]  336-882-2125   °[facility]  336-784-9470   °[facility]  336-285-9073   °[facility]  336-845-3988   °Residential & Outpatient Substance Abuse Program  1-800-659-3381   °Psychological Services °Organization         Address  Phone  Notes  °[facility]  336- 832-9600   °[facility]  336- 378-7881   °[facility] [address], [place] 1-800-853-5163 or 336-641-4981   ° °Mobile Crisis Teams °Organization         Address  Phone  Notes  °[facility], Mobile Crisis Care Unit  1-877-626-1772   °Assertive °[facility] ° [address]  [place], [place] 336-834-9664   °Sharon DeEsch [address] °[place] 336-554-5454   ° °Self-Help/Support Groups °Organization         Address  Phone             Notes  °[facility] - variety of support groups  336- 373-1402 Call for more information  °Narcotics Anonymous (NA), [facility] [address], °[place]   2 meetings at this location  ° °  Residential Treatment Programs Organization         Address  Phone  Notes  [facility] [address],    [place]  1-413-119-8860   [facility]  [address], [place] [id number], [place], [place]   [place], [place] (780) 611-7442 Admissions: 8am-3pm M-F  [facility] [place] [address]    [place], [place] [phone]   [facility] [address] [place], [place], [place]   [facility] [address],  [place], [place]   [facility] Intensive Outpatient [address], [place], [place]   [facility] ([place]  [place], [place] 1-747-698-5415 or 715-098-1616   [facility] [address], [place], [place] Accepts Medicaid  [facility] [address]  [place] 1-314 415 1066 Substance Abuse/Addiction Treatment   [facility]         Address  Phone  Notes  [facility]  512-824-0095   Domenic Schwab, PhD [address] Arlis Porta [place], [place]   502-375-5196 or (712)381-9016   [facility] [place], [place] 657-724-1001   [facility] [address], [place], [place] 415-502-6424 Insurance/Medicaid/sponsorship through [facility] [address]                                    [place], [place] [phone] [place] [address] [place], [place] ([phone]    Dr. Adele Schilder  [phone]   [facility] Dept. 1) [address], [place] 2) [place] 3)  [address], [place] [phone] [phone]  [phone]   [place] 925-552-1880 or [phone] (After Hours)       Take your prescriptions as previously directed.  Call your regular medical doctor today to schedule a follow up appointment within the next 2 days.  Return to the Emergency Department immediately sooner if worsening.

## 2013-07-09 ENCOUNTER — Ambulatory Visit (HOSPITAL_COMMUNITY)
Admission: RE | Admit: 2013-07-09 | Discharge: 2013-07-09 | Disposition: A | Discharge status: Home / Self Care | Payer: BC Managed Care – PPO | Source: Ambulatory Visit | Attending: Internal Medicine | Admitting: Internal Medicine

## 2013-07-09 DIAGNOSIS — N281 Cyst of kidney, acquired: Secondary | ICD-10-CM

## 2013-07-09 DIAGNOSIS — N189 Chronic kidney disease, unspecified: Secondary | ICD-10-CM | POA: Insufficient documentation

## 2013-07-09 DIAGNOSIS — I10 Essential (primary) hypertension: Secondary | ICD-10-CM

## 2013-07-09 LAB — URINE CULTURE
Colony Count: NO GROWTH
Culture: NO GROWTH

## 2013-07-09 NOTE — Progress Notes (Signed)
Case discussed with Dr. Boggala at the time of the visit.  We reviewed the resident's history and exam and pertinent patient test results.  I agree with the assessment, diagnosis, and plan of care documented in the resident's note. 

## 2013-07-10 ENCOUNTER — Ambulatory Visit: Payer: BC Managed Care – PPO | Admitting: Internal Medicine

## 2013-09-24 ENCOUNTER — Ambulatory Visit (INDEPENDENT_AMBULATORY_CARE_PROVIDER_SITE_OTHER): Payer: BC Managed Care – PPO | Admitting: Internal Medicine

## 2013-09-24 ENCOUNTER — Encounter: Payer: Self-pay | Admitting: Internal Medicine

## 2013-09-24 VITALS — BP 180/80 | HR 62 | Temp 98.5°F | Ht 71.0 in | Wt 150.0 lb

## 2013-09-24 DIAGNOSIS — F17200 Nicotine dependence, unspecified, uncomplicated: Secondary | ICD-10-CM

## 2013-09-24 DIAGNOSIS — I10 Essential (primary) hypertension: Secondary | ICD-10-CM

## 2013-09-24 MED ORDER — HYDRALAZINE HCL 50 MG PO TABS: 50.0000 mg | ORAL_TABLET | Freq: Two times a day (BID) | ORAL | Status: DC

## 2013-09-24 MED ORDER — CLONIDINE HCL 0.1 MG PO TABS: 0.1000 mg | ORAL_TABLET | Freq: Two times a day (BID) | ORAL | Status: DC

## 2013-09-24 MED ORDER — AMLODIPINE BESYLATE 10 MG PO TABS: 10.0000 mg | ORAL_TABLET | Freq: Every day | ORAL | Status: DC

## 2013-09-24 MED ORDER — LISINOPRIL 40 MG PO TABS: 40.0000 mg | ORAL_TABLET | Freq: Every day | ORAL | Status: DC

## 2013-09-24 MED ORDER — FUROSEMIDE 20 MG PO TABS: 20.0000 mg | ORAL_TABLET | Freq: Every day | ORAL | Status: DC

## 2013-09-24 NOTE — Progress Notes (Signed)
Case discussed with Dr. McLean at the time of the visit.  We reviewed the resident's history and exam and pertinent patient test results.  I agree with the assessment, diagnosis, and plan of care documented in the resident's note.     

## 2013-09-24 NOTE — Assessment & Plan Note (Signed)
BP Readings from Last 3 Encounters:  09/24/13 180/80  07/08/13 132/68  07/06/13 133/77    Lab Results  Component Value Date   NA 140 07/08/2013   K 3.8 07/08/2013   CREATININE 1.61* 07/08/2013    Assessment: Blood pressure control: moderately elevated Progress toward BP goal:  deteriorated Comments: ran out of medications otherwise controlled when on medications   Plan: Medications:  continue current medications (Lisinopril 40 mg qd, Norvasc 10 mg qd, Lasix 20 mg qd, Hydralazine 50 mg bid, Clonidine 0.1 mg bid) Educational resources provided: brochure Self management tools provided: home blood pressure logbook Other plans: f/u in 1-3 months given 90 d supply with 1 refill of BP meds

## 2013-09-24 NOTE — Patient Instructions (Addendum)
General Instructions: Please follow up in no more than 1-3 months for blood pressure Take your medications please You can take the Lasix in the morning so you are not up all night peeing Take care.   Treatment Goals:  Goals (1 Years of Data) as of 09/24/13         As of Today 07/08/13 07/08/13 07/08/13 07/08/13     Blood Pressure    . Blood Pressure < 140/90  180/80 132/68 125/63 125/63 128/65      Progress Toward Treatment Goals:  Treatment Goal 09/24/2013  Blood pressure deteriorated  Stop smoking smoking less    Self Care Goals & Plans:  Self Care Goal 09/24/2013  Manage my medications take my medicines as prescribed; bring my medications to every visit; refill my medications on time; follow the sick day instructions if I am sick  Monitor my health keep track of my blood pressure  Eat healthy foods drink diet soda or water instead of juice or soda; eat more vegetables; eat foods that are low in salt; eat baked foods instead of fried foods; eat fruit for snacks and desserts; eat smaller portions  Be physically active find an activity I enjoy  Stop smoking call [facility] (1-800-QUIT-NOW)  Meeting treatment goals maintain the current self-care plan    No flowsheet data found.   Care Management & Community Referrals:  Referral 09/24/2013  Referrals made for care management support none needed  Referrals made to community resources none     Hypertension Hypertension, commonly called high blood pressure, is when the force of blood pumping through your arteries is too strong. Your arteries are the blood vessels that carry blood from your heart throughout your body. A blood pressure reading consists of a higher number over a lower number, such as 110/72. The higher number (systolic) is the pressure inside your arteries when your heart pumps. The lower number (diastolic) is the pressure inside your arteries when your heart relaxes. Ideally you want your blood pressure below  120/80. Hypertension forces your heart to work harder to pump blood. Your arteries may become narrow or stiff. Having hypertension puts you at risk for heart disease, stroke, and other problems.  RISK FACTORS Some risk factors for high blood pressure are controllable. Others are not.  Risk factors you cannot control include:   Race. You may be at higher risk if you are African American.  Age. Risk increases with age.  Gender. Men are at higher risk than women before age 61 years. After age 59, women are at higher risk than men. Risk factors you can control include:  Not getting enough exercise or physical activity.  Being overweight.  Getting too much fat, sugar, calories, or salt in your diet.  Drinking too much alcohol. SIGNS AND SYMPTOMS Hypertension does not usually cause signs or symptoms. Extremely high blood pressure (hypertensive crisis) may cause headache, anxiety, shortness of breath, and nosebleed. DIAGNOSIS  To check if you have hypertension, your health care provider will measure your blood pressure while you are seated, with your arm held at the level of your heart. It should be measured at least twice using the same arm. Certain conditions can cause a difference in blood pressure between your right and left arms. A blood pressure reading that is higher than normal on one occasion does not mean that you need treatment. If one blood pressure reading is high, ask your health care provider about having it checked again. TREATMENT  Treating high blood  pressure includes making lifestyle changes and possibly taking medicine. Living a healthy lifestyle can help lower high blood pressure. You may need to change some of your habits. Lifestyle changes may include:  Following the DASH diet. This diet is high in fruits, vegetables, and whole grains. It is low in salt, red meat, and added sugars.  Getting at least 2 hours of brisk physical activity every week.  Losing weight if  necessary.  Not smoking.  Limiting alcoholic beverages.  Learning ways to reduce stress. If lifestyle changes are not enough to get your blood pressure under control, your health care provider may prescribe medicine. You may need to take more than one. Work closely with your health care provider to understand the risks and benefits. HOME CARE INSTRUCTIONS  Have your blood pressure rechecked as directed by your health care provider.   Take medicines only as directed by your health care provider. Follow the directions carefully. Blood pressure medicines must be taken as prescribed. The medicine does not work as well when you skip doses. Skipping doses also puts you at risk for problems.   Do not smoke.   Monitor your blood pressure at home as directed by your health care provider. SEEK MEDICAL CARE IF:   You think you are having a reaction to medicines taken.  You have recurrent headaches or feel dizzy.  You have swelling in your ankles.  You have trouble with your vision. SEEK IMMEDIATE MEDICAL CARE IF:  You develop a severe headache or confusion.  You have unusual weakness, numbness, or feel faint.  You have severe chest or abdominal pain.  You vomit repeatedly.  You have trouble breathing. MAKE SURE YOU:   Understand these instructions.  Will watch your condition.  Will get help right away if you are not doing well or get worse. Document Released: 02/19/2005 Document Revised: 07/06/2013 Document Reviewed: 12/12/2012 [facility] Patient Information 2015 [place], [place]. This information is not intended to replace advice given to you by your health care provider. Make sure you discuss any questions you have with your health care provider.

## 2013-09-24 NOTE — Progress Notes (Signed)
   Subjective:    Patient ID: William Perkins, male    DOB: 05-02-1950, 63 y.o.   MRN: [id number]  HPI Comments: 63 y.o PMH HTN (BP 180/80), HLD (LDL 149 07/2013), tobacco abuse (smoking since age 65 now smoking 2 ppd), GERD, constipation, CKD 3   He presents for f/u and annual exam: 1. HTN-uncontrolled not taking BP medications x 1 week due to being out of medications. He has med bottles for HTN and he takes Lisinopril 40 mg qd, Norvasc 10 mg qd, Lasix 20 mg qd, Hydralazine 50 mg bid, Clonidine 0.1 mg bid.  With these agents BP is normally controlled.  He requests Rx refills.    SH: still smoking 1 ppd        Review of Systems  Respiratory: Negative for shortness of breath.   Cardiovascular: Negative for chest pain.  Gastrointestinal: Negative for constipation and blood in stool.  Genitourinary: Negative for difficulty urinating.  Neurological: Negative for headaches.       Objective:   Physical Exam  Nursing note and vitals reviewed. Constitutional: He is oriented to person, place, and time. He appears well-developed and well-nourished. He is cooperative.  HENT:  Head: Normocephalic and atraumatic.  Mouth/Throat: Oropharynx is clear and moist and mucous membranes are normal. Abnormal dentition. No oropharyngeal exudate.  Eyes: Conjunctivae are normal. Pupils are equal, round, and reactive to light. Right eye exhibits no discharge. Left eye exhibits no discharge. No scleral icterus.  Cardiovascular: Normal rate, regular rhythm, S1 normal, S2 normal and normal heart sounds.   No murmur heard. No lower ext edema   Pulmonary/Chest: Effort normal and breath sounds normal.  Abdominal: Soft. Bowel sounds are normal. He exhibits no distension. There is no tenderness.  Musculoskeletal: He exhibits no edema.  Neurological: He is alert and oriented to person, place, and time. Gait normal.  Skin: Skin is warm, dry and intact. No rash noted.  Psychiatric: He has a normal mood and affect. His  speech is normal and behavior is normal. Judgment and thought content normal. Cognition and memory are normal.          Assessment & Plan:  F/u in 1-3 months HTN

## 2013-09-24 NOTE — Assessment & Plan Note (Signed)
  Assessment: Progress toward smoking cessation:  smoking less Barriers to progress toward smoking cessation:   (duration ) Comments: none  Plan: Instruction/counseling given:  I counseled patient on the dangers of tobacco use, advised patient to stop smoking, and reviewed strategies to maximize success. Educational resources provided:  [facility] Insurance account manager) brochure Self management tools provided: none Medications to assist with smoking cessation:  None Patient agreed to the following self-care plans for smoking cessation: call [facility] (1-800-QUIT-NOW)  Other plans: reassess at f/u

## 2013-11-19 ENCOUNTER — Telehealth: Payer: Self-pay | Admitting: *Deleted

## 2013-11-19 ENCOUNTER — Other Ambulatory Visit: Payer: Self-pay | Admitting: Internal Medicine

## 2013-11-19 MED ORDER — VARENICLINE TARTRATE 0.5 MG PO TABS: 0.5000 mg | ORAL_TABLET | Freq: Two times a day (BID) | ORAL | Status: DC

## 2013-11-19 NOTE — Telephone Encounter (Signed)
Pt called - insurance will now pay for Chantix - to stop smoking. Offered an appt - states you have already talked to him. Uses [facility]. Hilda Blades Noble Cicalese RN 11/19/13 9:35AM

## 2013-11-20 NOTE — Telephone Encounter (Signed)
Rx called in to pharmacy and pt aware.

## 2013-12-18 ENCOUNTER — Other Ambulatory Visit: Payer: Self-pay | Admitting: *Deleted

## 2013-12-18 MED ORDER — OMEPRAZOLE 40 MG PO CPDR: 40.0000 mg | DELAYED_RELEASE_CAPSULE | Freq: Every day | ORAL | Status: DC

## 2013-12-23 ENCOUNTER — Encounter: Payer: Self-pay | Admitting: Internal Medicine

## 2014-01-07 ENCOUNTER — Encounter: Payer: Self-pay | Admitting: Internal Medicine

## 2014-01-07 ENCOUNTER — Ambulatory Visit (INDEPENDENT_AMBULATORY_CARE_PROVIDER_SITE_OTHER): Payer: BC Managed Care – PPO | Admitting: Internal Medicine

## 2014-01-07 VITALS — BP 170/91 | HR 86 | Ht 71.0 in | Wt 149.8 lb

## 2014-01-07 DIAGNOSIS — N183 Chronic kidney disease, stage 3 unspecified: Secondary | ICD-10-CM

## 2014-01-07 DIAGNOSIS — R202 Paresthesia of skin: Secondary | ICD-10-CM | POA: Diagnosis not present

## 2014-01-07 DIAGNOSIS — R6889 Other general symptoms and signs: Secondary | ICD-10-CM | POA: Diagnosis not present

## 2014-01-07 DIAGNOSIS — Z0000 Encounter for general adult medical examination without abnormal findings: Secondary | ICD-10-CM

## 2014-01-07 DIAGNOSIS — I10 Essential (primary) hypertension: Secondary | ICD-10-CM

## 2014-01-07 DIAGNOSIS — E785 Hyperlipidemia, unspecified: Secondary | ICD-10-CM | POA: Diagnosis not present

## 2014-01-07 DIAGNOSIS — F17200 Nicotine dependence, unspecified, uncomplicated: Secondary | ICD-10-CM

## 2014-01-07 DIAGNOSIS — R200 Anesthesia of skin: Secondary | ICD-10-CM | POA: Insufficient documentation

## 2014-01-07 DIAGNOSIS — Z720 Tobacco use: Secondary | ICD-10-CM

## 2014-01-07 DIAGNOSIS — I129 Hypertensive chronic kidney disease with stage 1 through stage 4 chronic kidney disease, or unspecified chronic kidney disease: Secondary | ICD-10-CM

## 2014-01-07 LAB — BASIC METABOLIC PANEL WITH GFR
BUN: 19 mg/dL (ref 6–23)
CO2: 23 mEq/L (ref 19–32)
Calcium: 9.4 mg/dL (ref 8.4–10.5)
Chloride: 105 mEq/L (ref 96–112)
Creat: 1.66 mg/dL — ABNORMAL HIGH (ref 0.50–1.35)
GFR, Est African American: 50 mL/min — ABNORMAL LOW
GFR, Est Non African American: 43 mL/min — ABNORMAL LOW
Glucose, Bld: 142 mg/dL — ABNORMAL HIGH (ref 70–99)
Potassium: 3.4 mEq/L — ABNORMAL LOW (ref 3.5–5.3)
Sodium: 138 mEq/L (ref 135–145)

## 2014-01-07 LAB — LIPID PANEL
Cholesterol: 247 mg/dL — ABNORMAL HIGH (ref 0–200)
HDL: 55 mg/dL (ref >39)
LDL Cholesterol: 158 mg/dL — ABNORMAL HIGH (ref 0–99)
Total CHOL/HDL Ratio: 4.5 Ratio
Triglycerides: 169 mg/dL — ABNORMAL HIGH (ref <150)
VLDL: 34 mg/dL (ref 0–40)

## 2014-01-07 MED ORDER — FUROSEMIDE 20 MG PO TABS: 20.0000 mg | ORAL_TABLET | Freq: Every day | ORAL | Status: DC

## 2014-01-07 MED ORDER — AMLODIPINE BESYLATE 10 MG PO TABS: 10.0000 mg | ORAL_TABLET | Freq: Every day | ORAL | Status: DC

## 2014-01-07 MED ORDER — LISINOPRIL 40 MG PO TABS: 40.0000 mg | ORAL_TABLET | Freq: Every day | ORAL | Status: DC

## 2014-01-07 MED ORDER — HYDRALAZINE HCL 50 MG PO TABS: 50.0000 mg | ORAL_TABLET | Freq: Two times a day (BID) | ORAL | Status: DC

## 2014-01-07 MED ORDER — CLONIDINE HCL 0.1 MG PO TABS: 0.1000 mg | ORAL_TABLET | Freq: Two times a day (BID) | ORAL | Status: DC

## 2014-01-07 MED ORDER — PRAVASTATIN SODIUM 40 MG PO TABS: 40.0000 mg | ORAL_TABLET | Freq: Every day | ORAL | Status: DC

## 2014-01-07 NOTE — Assessment & Plan Note (Signed)
CKD 3 likely 2/2 HTN Will check BMET today

## 2014-01-07 NOTE — Progress Notes (Signed)
Patient ID: William Perkins, male   DOB: 02-24-1951, 63 y.o.   MRN: [id number]  Case discussed with Dr. Aundra Dubin soon after the resident saw the patient.  We reviewed the resident's history and exam and pertinent patient test results.  I agree with the assessment, diagnosis, and plan of care documented in the resident's note.

## 2014-01-07 NOTE — Progress Notes (Signed)
   Subjective:    Patient ID: William Perkins, male    DOB: Jul 15, 1950, 63 y.o.   MRN: [id number]  HPI Comments:  63 y.o PMH HTN, CKD 3, HLD, tobacco abuse, GERD  He presents for f/u 1. HTN-on Norvasc 10, Clonidine 0.1 bid, Hydralazine 50 mg bid, Lasix 20, Lisinopril 40 mg qd. He has been out of medications since Monday. BP is 170/91 today then 189/99 2. Smoking-asked for Chantix a while back and stopped smoking 11/21/13  3. HLD-will check lipid panel today  4. C/o numbness/tingling in right lower extremity and left index finger intermittently.  Denies back pain.    HM-due for flu shot and pna vx 23 today declined.  will need prevnar 2 in 1 year or age 17  SH: denies alcohol, quit smoking cigarettes     Review of Systems  Constitutional: Negative for unexpected weight change.  Respiratory: Negative for cough and shortness of breath.   Cardiovascular: Negative for chest pain and leg swelling.  Gastrointestinal: Positive for constipation. Negative for blood in stool.  Endocrine: Positive for cold intolerance.       C/o hot flashes intermittently  Genitourinary: Negative for hematuria.  Musculoskeletal: Negative for back pain.  Skin: Negative for rash.  Neurological: Positive for numbness. Negative for headaches.       Objective:   Physical Exam  Constitutional: He is oriented to person, place, and time. He appears well-developed and well-nourished. He is cooperative.  HENT:  Head: Normocephalic and atraumatic.  Mouth/Throat: Oropharynx is clear and moist and mucous membranes are normal. Abnormal dentition. No oropharyngeal exudate.  Eyes: Conjunctivae are normal. Pupils are equal, round, and reactive to light. Right eye exhibits no discharge. Left eye exhibits no discharge. No scleral icterus.  Cardiovascular: Normal rate, regular rhythm, S1 normal, S2 normal and normal heart sounds.   No murmur heard. No lower ext edema   Pulmonary/Chest: Effort normal and breath sounds normal.    Abdominal: Soft. Bowel sounds are normal. He exhibits no distension. There is no tenderness.  Musculoskeletal: He exhibits no edema.  Neurological: He is alert and oriented to person, place, and time. He has normal strength. No cranial nerve deficit or sensory deficit. Gait normal.  CN 2-12 grossly intact   Skin: Skin is warm, dry and intact. No rash noted.  Psychiatric: He has a normal mood and affect. His speech is normal and behavior is normal. Judgment and thought content normal. Cognition and memory are normal.  Nursing note and vitals reviewed.         Assessment & Plan:  F/u in 4-6 months, sooner if BP doesn't improved

## 2014-01-07 NOTE — Assessment & Plan Note (Addendum)
Noted in right lower extremity, left index finger ?neuropathy  Will check B12, folate to r/o deficiency, tsh

## 2014-01-07 NOTE — Assessment & Plan Note (Addendum)
Flu and pna 23 offered by pt declined today  Will need to offer prevnar 13 at age 63 Consider low dose CT scan in the future given long h/o smoking to r/o malignancy

## 2014-01-07 NOTE — Assessment & Plan Note (Signed)
BP Readings from Last 3 Encounters:  01/07/14 170/91  09/24/13 180/80  07/08/13 132/68    Lab Results  Component Value Date   NA 140 07/08/2013   K 3.8 07/08/2013   CREATININE 1.61* 07/08/2013    Assessment: Blood pressure control: mildly elevated Progress toward BP goal:  deteriorated Comments: none  Plan: Medications:  continue current medications (Norvasc 10, Clonidine 0.1 mg bid, Lasix 20 mg, Hydralazine 50 mg bid) Self management tools provided: home blood pressure logbook Other plans:BMET, lipid today

## 2014-01-07 NOTE — Assessment & Plan Note (Signed)
Will check tsh.  Also numbness and tingling can be caused from hypothyroidism

## 2014-01-07 NOTE — Assessment & Plan Note (Signed)
Will check lipid panel today 

## 2014-01-07 NOTE — Patient Instructions (Addendum)
General Instructions: Please follow up in 1 month if your blood pressure is still high otherwise; 4-6 months, sooner if needed  Take care  Glad you quit smoking   Read information below   Thank you for bringing your medicines today. This helps [place] keep you safe from mistakes.   Progress Toward Treatment Goals:  Treatment Goal 01/07/2014  Blood pressure at goal  Stop smoking smoking less    Self Care Goals & Plans:  Self Care Goal 01/07/2014  Manage my medications take my medicines as prescribed; bring my medications to every visit; refill my medications on time  Monitor my health keep track of my blood pressure  Eat healthy foods drink diet soda or water instead of juice or soda; eat more vegetables; eat foods that are low in salt; eat baked foods instead of fried foods; eat fruit for snacks and desserts  Be physically active find an activity I enjoy  Stop smoking call [facility] (1-800-QUIT-NOW)  Meeting treatment goals maintain the current self-care plan    No flowsheet data found.   Care Management & Community Referrals:  Referral 01/07/2014  Referrals made for care management support -  Referrals made to community resources none        Treatment Goals:  Goals (1 Years of Data) as of 01/07/14          As of Today 09/24/13 07/08/13 07/08/13 07/08/13     Blood Pressure   . Blood Pressure < 140/90  170/91 180/80 132/68 125/63 125/63      Progress Toward Treatment Goals:  Treatment Goal 01/07/2014  Blood pressure at goal  Stop smoking smoking less    Self Care Goals & Plans:  Self Care Goal 01/07/2014  Manage my medications take my medicines as prescribed; bring my medications to every visit; refill my medications on time; follow the sick day instructions if I am sick  Monitor my health keep track of my blood pressure  Eat healthy foods drink diet soda or water instead of juice or soda; eat more vegetables; eat foods that are low in salt; eat baked foods instead of  fried foods; eat fruit for snacks and desserts; eat smaller portions  Be physically active find an activity I enjoy  Stop smoking call [facility] (1-800-QUIT-NOW)  Meeting treatment goals maintain the current self-care plan    No flowsheet data found.   Care Management & Community Referrals:  Referral 01/07/2014  Referrals made for care management support -  Referrals made to community resources none      Hypertension Hypertension, commonly called high blood pressure, is when the force of blood pumping through your arteries is too strong. Your arteries are the blood vessels that carry blood from your heart throughout your body. A blood pressure reading consists of a higher number over a lower number, such as 110/72. The higher number (systolic) is the pressure inside your arteries when your heart pumps. The lower number (diastolic) is the pressure inside your arteries when your heart relaxes. Ideally you want your blood pressure below 120/80. Hypertension forces your heart to work harder to pump blood. Your arteries may become narrow or stiff. Having hypertension puts you at risk for heart disease, stroke, and other problems.  RISK FACTORS Some risk factors for high blood pressure are controllable. Others are not.  Risk factors you cannot control include:   Race. You may be at higher risk if you are African American.  Age. Risk increases with age.  Gender. Men are  at higher risk than women before age 73 years. After age 11, women are at higher risk than men. Risk factors you can control include:  Not getting enough exercise or physical activity.  Being overweight.  Getting too much fat, sugar, calories, or salt in your diet.  Drinking too much alcohol. SIGNS AND SYMPTOMS Hypertension does not usually cause signs or symptoms. Extremely high blood pressure (hypertensive crisis) may cause headache, anxiety, shortness of breath, and nosebleed. DIAGNOSIS  To check if you have  hypertension, your health care provider will measure your blood pressure while you are seated, with your arm held at the level of your heart. It should be measured at least twice using the same arm. Certain conditions can cause a difference in blood pressure between your right and left arms. A blood pressure reading that is higher than normal on one occasion does not mean that you need treatment. If one blood pressure reading is high, ask your health care provider about having it checked again. TREATMENT  Treating high blood pressure includes making lifestyle changes and possibly taking medicine. Living a healthy lifestyle can help lower high blood pressure. You may need to change some of your habits. Lifestyle changes may include:  Following the DASH diet. This diet is high in fruits, vegetables, and whole grains. It is low in salt, red meat, and added sugars.  Getting at least 2 hours of brisk physical activity every week.  Losing weight if necessary.  Not smoking.  Limiting alcoholic beverages.  Learning ways to reduce stress. If lifestyle changes are not enough to get your blood pressure under control, your health care provider may prescribe medicine. You may need to take more than one. Work closely with your health care provider to understand the risks and benefits. HOME CARE INSTRUCTIONS  Have your blood pressure rechecked as directed by your health care provider.   Take medicines only as directed by your health care provider. Follow the directions carefully. Blood pressure medicines must be taken as prescribed. The medicine does not work as well when you skip doses. Skipping doses also puts you at risk for problems.   Do not smoke.   Monitor your blood pressure at home as directed by your health care provider. SEEK MEDICAL CARE IF:   You think you are having a reaction to medicines taken.  You have recurrent headaches or feel dizzy.  You have swelling in your  ankles.  You have trouble with your vision. SEEK IMMEDIATE MEDICAL CARE IF:  You develop a severe headache or confusion.  You have unusual weakness, numbness, or feel faint.  You have severe chest or abdominal pain.  You vomit repeatedly.  You have trouble breathing. MAKE SURE YOU:   Understand these instructions.  Will watch your condition.  Will get help right away if you are not doing well or get worse. Document Released: 02/19/2005 Document Revised: 07/06/2013 Document Reviewed: 12/12/2012 [facility] Patient Information 2015 [place], [place]. This information is not intended to replace advice given to you by your health care provider. Make sure you discuss any questions you have with your health care provider.

## 2014-01-07 NOTE — Assessment & Plan Note (Signed)
Asked for Chantix  Continue to encouraged cessation

## 2014-01-08 ENCOUNTER — Other Ambulatory Visit: Payer: Self-pay | Admitting: Internal Medicine

## 2014-01-08 ENCOUNTER — Encounter: Payer: Self-pay | Admitting: Internal Medicine

## 2014-01-08 DIAGNOSIS — R739 Hyperglycemia, unspecified: Secondary | ICD-10-CM

## 2014-01-08 LAB — TSH: TSH: 0.384 u[IU]/mL (ref 0.350–4.500)

## 2014-01-08 LAB — FOLATE: Folate: 17.1 ng/mL

## 2014-01-08 LAB — VITAMIN B12: Vitamin B-12: 396 pg/mL (ref 211–911)

## 2014-04-29 ENCOUNTER — Encounter: Payer: Self-pay | Admitting: Internal Medicine

## 2014-04-29 ENCOUNTER — Ambulatory Visit (INDEPENDENT_AMBULATORY_CARE_PROVIDER_SITE_OTHER): Payer: BLUE CROSS/BLUE SHIELD | Admitting: Internal Medicine

## 2014-04-29 VITALS — BP 159/80 | HR 52 | Temp 97.6°F | Ht 71.0 in | Wt 158.6 lb

## 2014-04-29 DIAGNOSIS — H5711 Ocular pain, right eye: Secondary | ICD-10-CM | POA: Diagnosis not present

## 2014-04-29 NOTE — Assessment & Plan Note (Signed)
Presentation not consistent with viral/allergic conjunctivitis given that he had eye pain with vision changes. Closed-angle glaucoma possible though unlikely given that his pain has subsided. Corneal abrasion/burn from chemical spill also possible though not as likely given that the exposure happened 3 weeks ago. He could have had an accidental corneal abrasion that could explain the symptoms.  -Referred to Ophthalmology, urgently for further evaluation.

## 2014-04-29 NOTE — Patient Instructions (Signed)
-  Please go to [place] [facility] today, they are expecting you there.  -Follow up with [place] as soon as you can to see your PCP, Dr. Karlyn Agee.

## 2014-04-29 NOTE — Progress Notes (Signed)
   Subjective:    Patient ID: William Perkins, male    DOB: May 03, 1950, 64 y.o.   MRN: [id number]  HPI  Mr. Callis is a 64 yr old man with PMH of HTN, CKD3, GERD, HLP, COPD, presenting with right eye pain. He states that 3 weeks ago he had a clinical chemical spill in his right eye (he does not know if bleach/amonnia or the name of it, chemical that is used to clean parts), he wiped it with a wet towel but did not use the eye wash station as he thought it had been just a small amount of product.  He had no eye pain or burning until Thursday last week when he started having right eye pain with a sensation of foreign body in it. He did not see anything in his eye but felt that something had been stuck inside his upper eyelid. The pain got worse, 8/10 yesterday. He experienced watery discharge but denies purulent discharge. His vision became blurry and he had sensitivity to ight. He rested yesterday and woke up today with no eye pain but with a persistent burning sensation and blurry vision.  He had something similar to this years ago but does not recall what he was diagnosed with. He has never seen an eye doctor. He notes that he uses reading glasses but sees well otherwise.  He is also concerned about being screened for diabetes as both his parents and his brother has diabetes.    Review of Systems  Constitutional: Negative for fever, chills, activity change, appetite change, fatigue and unexpected weight change.  HENT: Negative for congestion and ear pain.   Eyes: Positive for pain, redness, itching and visual disturbance.  Respiratory: Negative for cough, shortness of breath and wheezing.   Cardiovascular: Negative for chest pain, palpitations and leg swelling.  Gastrointestinal: Negative for abdominal pain.  Genitourinary: Negative for dysuria and difficulty urinating.  Musculoskeletal: Negative for myalgias.  Neurological: Negative for dizziness and light-headedness.  Psychiatric/Behavioral:  Negative for agitation.       Objective:   Physical Exam  Constitutional: He is oriented to person, place, and time. He appears well-developed and well-nourished. No distress.  HENT:  Head: Normocephalic.  Nose: Nose normal.  Mouth/Throat: Oropharynx is clear and moist.  Eyes: EOM are normal. Pupils are equal, round, and reactive to light. Right eye exhibits no discharge. Left eye exhibits no discharge.  Red conjunctiva right eye with no signs of trauma. Right eyelid with small area of redness at the upper lateral corner but with no nodule/pustule/papule. No swelling of the eyelid appreciated. The eye is not tender to palpation. Fundoscopic exam attempted but limited as his pupils are not dilated.    Cardiovascular: Normal rate.   Pulmonary/Chest: Effort normal. No respiratory distress.  Neurological: He is alert and oriented to person, place, and time. Coordination normal.  Skin: Skin is warm and dry. No rash noted. He is not diaphoretic. No erythema.  Psychiatric: He has a normal mood and affect.  Nursing note and vitals reviewed.         Assessment & Plan:

## 2014-05-03 NOTE — Progress Notes (Signed)
Medicine attending: Medical history, presenting problems, physical findings, and medications, reviewed with Dr Soli Kennerly and I concur with  herevaluation and management plan. 

## 2014-06-02 ENCOUNTER — Ambulatory Visit (INDEPENDENT_AMBULATORY_CARE_PROVIDER_SITE_OTHER): Payer: BLUE CROSS/BLUE SHIELD | Admitting: Internal Medicine

## 2014-06-02 ENCOUNTER — Encounter: Payer: BLUE CROSS/BLUE SHIELD | Admitting: Internal Medicine

## 2014-06-02 ENCOUNTER — Encounter: Payer: Self-pay | Admitting: Internal Medicine

## 2014-06-02 VITALS — BP 155/75 | HR 51 | Temp 97.4°F | Ht 71.0 in | Wt 159.9 lb

## 2014-06-02 DIAGNOSIS — M79601 Pain in right arm: Secondary | ICD-10-CM | POA: Diagnosis not present

## 2014-06-02 MED ORDER — DICLOFENAC SODIUM 1 % TD GEL
2.0000 g | Freq: Four times a day (QID) | TRANSDERMAL | Status: DC | PRN
Start: 2014-06-02 — End: 2015-05-20

## 2014-06-02 MED ORDER — ACETAMINOPHEN 325 MG PO TABS: 325.0000 mg | ORAL_TABLET | Freq: Four times a day (QID) | ORAL | Status: DC | PRN

## 2014-06-02 MED ORDER — CYCLOBENZAPRINE HCL 5 MG PO TABS: 5.0000 mg | ORAL_TABLET | Freq: Every evening | ORAL | Status: DC | PRN

## 2014-06-02 NOTE — Assessment & Plan Note (Signed)
Mr William Perkins has R arm pain mostly in his R lateral forearm that also travels down his forearm and up his tricep. 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 The pain improved some with ASA and also is worse at the end of the day and better in the morning. This is likely a muscle sprain or a tendinopathy given this history. VTE unlikely as no history and no swelling, warmth, tenderness on exam. Radiculopathy also considered given foraminal narrowing on c spine x-ray in 2014. This is unlikely given no numbness, paresthesia, focal findings on exam. Also considered OA but pain is not localized to a joint. -flexeril 5 mg qhsprn (instructed not to use before work or driving) -voltaren gel 1% qidprn -tylenol 325 mg po q6hprn -RTC to reassess in 1 month

## 2014-06-02 NOTE — Progress Notes (Signed)
Internal Medicine Clinic Attending  Case discussed with Dr. Rothman at the time of the visit.  We reviewed the resident's history and exam and pertinent patient test results.  I agree with the assessment, diagnosis, and plan of care documented in the resident's note. 

## 2014-06-02 NOTE — Patient Instructions (Signed)
It was a pleasure to see you today. You can take the flexeril muscle relaxer only at night or on the weekend when you do not work or drive. You can apply the voltaren gel up to four times a day as needed. You can take the Tylenol 325 mg tab every 6 hours as needed. Please return to clinic or seek medical attention if you have any new or worsening weakness, numbness, or other worrisome medical condition. We look forward to seeing you again in one month.  Lottie Mussel, MD  General Instructions:   Thank you for bringing your medicines today. This helps [place] keep you safe from mistakes.   Progress Toward Treatment Goals:  Treatment Goal 01/07/2014  Blood pressure deteriorated  Stop smoking smoking less    Self Care Goals & Plans:  Self Care Goal 06/02/2014  Manage my medications take my medicines as prescribed; bring my medications to every visit; refill my medications on time  Monitor my health keep track of my blood pressure  Eat healthy foods eat more vegetables; eat foods that are low in salt; eat baked foods instead of fried foods  Be physically active find an activity I enjoy  Stop smoking -  Meeting treatment goals -    No flowsheet data found.   Care Management & Community Referrals:  Referral 01/07/2014  Referrals made for care management support -  Referrals made to community resources none

## 2014-06-02 NOTE — Progress Notes (Signed)
   Subjective:    Patient ID: William Perkins, male    DOB: 1950/06/26, 65 y.o.   MRN: [id number]  HPI  William Perkins is a 64 year old man with HTN, COPD, CKD3, GERD, HL here for R arm pain. He says that it started last week shortly after raking the leaves in his yard. He describes it as achy. It is at worse on the lateral forearm around the elbow but radiates up towards the shoulder. He thinks it is worse in the cold and getting warm makes it better. On further questioning, he says that at the end of the day the pain is bad but in the morning when he wakes up it has gone away. He took ASA for it which helped some but notes he is not supposed to because of his kidneys. He has never had this before. He thinks he is fairly physically active as he runs a Production manager at work but does not remember any trauma or "pulling" a muscle. He thinks when the pain comes his hand feels weak but no numbness or paresthesia. He has never had a blood clot and is unaware of any family members with history of one.   Review of Systems  Constitutional: Negative for fever, chills and diaphoresis.  Respiratory: Negative for cough and shortness of breath.   Cardiovascular: Negative for chest pain.  Gastrointestinal: Negative for nausea, vomiting, abdominal pain, diarrhea and constipation.  Musculoskeletal: Positive for myalgias. Negative for joint swelling.  Neurological: Positive for weakness. Negative for light-headedness, numbness and headaches.       Objective:   Physical Exam  Constitutional: He is oriented to person, place, and time. He appears well-developed and well-nourished. No distress.  HENT:  Head: Normocephalic and atraumatic.  Mouth/Throat: Oropharynx is clear and moist.  Eyes: EOM are normal. Pupils are equal, round, and reactive to light.  Cardiovascular: Normal rate, regular rhythm, normal heart sounds and intact distal pulses.  Exam reveals no gallop and no friction rub.   No murmur  heard. Pulmonary/Chest: Effort normal and breath sounds normal. No respiratory distress. He has no wheezes.  Abdominal: Soft. Bowel sounds are normal. He exhibits no distension. There is no tenderness.  Musculoskeletal:  Full ROM and symmetric 5/5 in both arms without eliciting any pain on resistance. Sensation intact. No warmth, swelling, tenderness. Negative Neers test and can scratch opposite shoulder behind his back without pain. No signs of trauma.   Neurological: He is alert and oriented to person, place, and time.  Skin: He is not diaphoretic.  Vitals reviewed.         Assessment & Plan:

## 2014-06-03 ENCOUNTER — Other Ambulatory Visit: Payer: Self-pay | Admitting: Internal Medicine

## 2014-06-03 DIAGNOSIS — I10 Essential (primary) hypertension: Secondary | ICD-10-CM

## 2014-06-04 MED ORDER — FUROSEMIDE 20 MG PO TABS: 20.0000 mg | ORAL_TABLET | Freq: Every day | ORAL | Status: DC

## 2014-06-04 MED ORDER — CLONIDINE HCL 0.1 MG PO TABS: 0.1000 mg | ORAL_TABLET | Freq: Two times a day (BID) | ORAL | Status: DC

## 2014-06-04 MED ORDER — PRAVASTATIN SODIUM 40 MG PO TABS: 40.0000 mg | ORAL_TABLET | Freq: Every day | ORAL | Status: DC

## 2014-06-04 MED ORDER — HYDRALAZINE HCL 50 MG PO TABS: 50.0000 mg | ORAL_TABLET | Freq: Two times a day (BID) | ORAL | Status: DC

## 2014-06-04 MED ORDER — LISINOPRIL 40 MG PO TABS: 40.0000 mg | ORAL_TABLET | Freq: Every day | ORAL | Status: DC

## 2014-06-04 MED ORDER — AMLODIPINE BESYLATE 10 MG PO TABS
10.0000 mg | ORAL_TABLET | Freq: Every day | ORAL | Status: DC
Start: 2014-06-04 — End: 2014-10-14

## 2014-06-15 ENCOUNTER — Telehealth: Payer: Self-pay | Admitting: *Deleted

## 2014-06-15 NOTE — Telephone Encounter (Signed)
Received PA request from pt's pharmacy for Voltaren 1% Gel. Request submitted online via Cover My Meds.  Pt has chronic renal failure and should avoid NSAIDS.  Request has been approved 06/15/2014 through  03/04/2038.  Pharmacy was contacted.[place], Darlene Cassady4/12/201610:27 AM  Attempted to contact patient, number disconnected.Marland KitchenMarland Kitchen

## 2014-06-24 ENCOUNTER — Encounter: Payer: Self-pay | Admitting: Internal Medicine

## 2014-06-24 ENCOUNTER — Ambulatory Visit (INDEPENDENT_AMBULATORY_CARE_PROVIDER_SITE_OTHER): Payer: BLUE CROSS/BLUE SHIELD | Admitting: Internal Medicine

## 2014-06-24 VITALS — BP 160/60 | HR 61 | Temp 98.1°F | Ht 71.0 in | Wt 162.8 lb

## 2014-06-24 DIAGNOSIS — I10 Essential (primary) hypertension: Secondary | ICD-10-CM | POA: Diagnosis not present

## 2014-06-24 LAB — BASIC METABOLIC PANEL
BUN: 22 mg/dL (ref 6–23)
CO2: 26 mEq/L (ref 19–32)
Calcium: 9.5 mg/dL (ref 8.4–10.5)
Chloride: 101 mEq/L (ref 96–112)
Creat: 1.78 mg/dL — ABNORMAL HIGH (ref 0.50–1.35)
Glucose, Bld: 83 mg/dL (ref 70–99)
Potassium: 4 mEq/L (ref 3.5–5.3)
Sodium: 137 mEq/L (ref 135–145)

## 2014-06-24 NOTE — Patient Instructions (Addendum)
General Instructions: Please follow up in 1 month for your blood pressure  Log your blood pressure  Take your blood pressure medications in the morning at least Hydralazine and Clonidine Take care    Treatment Goals:  Goals (1 Years of Data) as of 06/24/14          As of Today 06/02/14 04/29/14 01/07/14 09/24/13     Blood Pressure   . Blood Pressure < 140/90  160/80 155/75 159/80 170/91 180/80      Progress Toward Treatment Goals:  Treatment Goal 06/24/2014  Blood pressure deteriorated  Stop smoking -    Self Care Goals & Plans:  Self Care Goal 06/24/2014  Manage my medications take my medicines as prescribed; bring my medications to every visit; refill my medications on time; follow the sick day instructions if I am sick  Monitor my health keep track of my blood pressure  Eat healthy foods drink diet soda or water instead of juice or soda; eat more vegetables; eat foods that are low in salt; eat baked foods instead of fried foods; eat fruit for snacks and desserts; eat smaller portions  Be physically active find an activity I enjoy  Stop smoking -  Meeting treatment goals maintain the current self-care plan    No flowsheet data found.   Care Management & Community Referrals:  Referral 06/24/2014  Referrals made for care management support none needed  Referrals made to community resources none       Hypertension Hypertension, commonly called high blood pressure, is when the force of blood pumping through your arteries is too strong. Your arteries are the blood vessels that carry blood from your heart throughout your body. A blood pressure reading consists of a higher number over a lower number, such as 110/72. The higher number (systolic) is the pressure inside your arteries when your heart pumps. The lower number (diastolic) is the pressure inside your arteries when your heart relaxes. Ideally you want your blood pressure below 120/80. Hypertension forces your heart to work  harder to pump blood. Your arteries may become narrow or stiff. Having hypertension puts you at risk for heart disease, stroke, and other problems.  RISK FACTORS Some risk factors for high blood pressure are controllable. Others are not.  Risk factors you cannot control include:   Race. You may be at higher risk if you are African American.  Age. Risk increases with age.  Gender. Men are at higher risk than women before age 11 years. After age 48, women are at higher risk than men. Risk factors you can control include:  Not getting enough exercise or physical activity.  Being overweight.  Getting too much fat, sugar, calories, or salt in your diet.  Drinking too much alcohol. SIGNS AND SYMPTOMS Hypertension does not usually cause signs or symptoms. Extremely high blood pressure (hypertensive crisis) may cause headache, anxiety, shortness of breath, and nosebleed. DIAGNOSIS  To check if you have hypertension, your health care provider will measure your blood pressure while you are seated, with your arm held at the level of your heart. It should be measured at least twice using the same arm. Certain conditions can cause a difference in blood pressure between your right and left arms. A blood pressure reading that is higher than normal on one occasion does not mean that you need treatment. If one blood pressure reading is high, ask your health care provider about having it checked again. TREATMENT  Treating high blood pressure includes making  lifestyle changes and possibly taking medicine. Living a healthy lifestyle can help lower high blood pressure. You may need to change some of your habits. Lifestyle changes may include:  Following the DASH diet. This diet is high in fruits, vegetables, and whole grains. It is low in salt, red meat, and added sugars.  Getting at least 2 hours of brisk physical activity every week.  Losing weight if necessary.  Not smoking.  Limiting alcoholic  beverages.  Learning ways to reduce stress. If lifestyle changes are not enough to get your blood pressure under control, your health care provider may prescribe medicine. You may need to take more than one. Work closely with your health care provider to understand the risks and benefits. HOME CARE INSTRUCTIONS  Have your blood pressure rechecked as directed by your health care provider.   Take medicines only as directed by your health care provider. Follow the directions carefully. Blood pressure medicines must be taken as prescribed. The medicine does not work as well when you skip doses. Skipping doses also puts you at risk for problems.   Do not smoke.   Monitor your blood pressure at home as directed by your health care provider. SEEK MEDICAL CARE IF:   You think you are having a reaction to medicines taken.  You have recurrent headaches or feel dizzy.  You have swelling in your ankles.  You have trouble with your vision. SEEK IMMEDIATE MEDICAL CARE IF:  You develop a severe headache or confusion.  You have unusual weakness, numbness, or feel faint.  You have severe chest or abdominal pain.  You vomit repeatedly.  You have trouble breathing. MAKE SURE YOU:   Understand these instructions.  Will watch your condition.  Will get help right away if you are not doing well or get worse. Document Released: 02/19/2005 Document Revised: 07/06/2013 Document Reviewed: 12/12/2012 [facility] Patient Information 2015 [place], [place]. This information is not intended to replace advice given to you by your health care provider. Make sure you discuss any questions you have with your health care provider.

## 2014-06-25 ENCOUNTER — Encounter: Payer: Self-pay | Admitting: Internal Medicine

## 2014-06-25 NOTE — Progress Notes (Signed)
Internal Medicine Clinic Attending  Case discussed with Dr. McLean at the time of the visit.  We reviewed the resident's history and exam and pertinent patient test results.  I agree with the assessment, diagnosis, and plan of care documented in the resident's note. 

## 2014-06-25 NOTE — Assessment & Plan Note (Signed)
BP Readings from Last 3 Encounters:  06/24/14 160/60  06/02/14 155/75  04/29/14 159/80    Lab Results  Component Value Date   NA 137 06/24/2014   K 4.0 06/24/2014   CREATININE 1.78* 06/24/2014    Assessment: Blood pressure control: moderately elevated Progress toward BP goal:  deteriorated Comments: BP elevated but pt is taking medications (all BP meds) qhs  Plan: Medications:  continue current medicationsNorvasc 10, Clonidine 0.1 bid, Lasix 20, Hydralazine 50 mg bid, Lisinopril 40 mg qd Educational resources provided: handout Other plans: encouraged pt to take Clonidine and Hydralazine in the am and pm. He will log BP and f/u in 1 month to recheck, check BMET today

## 2014-06-25 NOTE — Progress Notes (Signed)
   Subjective:    Patient ID: William Perkins, male    DOB: Dec 11, 1950, 64 y.o.   MRN: [id number]  HPI Comments: 64 y.o PMH HTN, CKD 3, HLD, tobacco abuse (quit smoking 11/2013), GERD  He presents for f/u 1. HTN-on Norvasc 10, Clonidine 0.1 bid (taking 0.2 qhs), Hydralazine 50 mg bid (taking 100 mg qhs), Lasix 20, Lisinopril 40 mg qd. His BP is 160/80 and repeat 160/60.  He has not taken BP meds today he says takes them all at night.  Pt states BP is running 120s at home when he checks  2. Right elbow pain resolved. He noticed right elbow pain after raking leaves.   3.  His left eye pain is resolved   HM-needs pna 23 and prevnar 13  SH: denies alcohol, quit smoking cigarettes 11/2013      Review of Systems  Respiratory: Negative for shortness of breath.   Cardiovascular: Negative for chest pain.  Neurological: Negative for headaches.       Objective:   Physical Exam  Constitutional: He is oriented to person, place, and time. He appears well-developed and well-nourished. He is cooperative.  HENT:  Head: Normocephalic and atraumatic.  Mouth/Throat: Abnormal dentition.  Eyes: Conjunctivae are normal. Right eye exhibits no discharge. Left eye exhibits no discharge. No scleral icterus.  Cardiovascular: Normal rate, regular rhythm, S1 normal, S2 normal and normal heart sounds.   No murmur heard. Pulmonary/Chest: Effort normal and breath sounds normal.  Abdominal: Soft. Bowel sounds are normal. He exhibits no distension. There is no tenderness.  Musculoskeletal: He exhibits no edema.  Neurological: He is alert and oriented to person, place, and time. Gait normal.  Skin: Skin is warm and dry. No rash noted.  Psychiatric: He has a normal mood and affect. His speech is normal and behavior is normal. Judgment and thought content normal. Cognition and memory are normal.  Nursing note and vitals reviewed.         Assessment & Plan:  F/u in 1 month HTN

## 2014-07-15 ENCOUNTER — Other Ambulatory Visit: Payer: Self-pay | Admitting: *Deleted

## 2014-07-15 DIAGNOSIS — I10 Essential (primary) hypertension: Secondary | ICD-10-CM

## 2014-07-15 NOTE — Telephone Encounter (Signed)
Pharmacy called and they do have refills for all meds

## 2014-07-15 NOTE — Telephone Encounter (Signed)
Last OV 4/21.  meds were not refilled

## 2014-07-21 ENCOUNTER — Encounter: Payer: Self-pay | Admitting: *Deleted

## 2014-07-22 ENCOUNTER — Telehealth: Payer: Self-pay | Admitting: Internal Medicine

## 2014-07-22 NOTE — Telephone Encounter (Signed)
Call to patient to confirm appointment for 07/23/14 at 9:15 lmtcb

## 2014-07-23 ENCOUNTER — Ambulatory Visit: Payer: BLUE CROSS/BLUE SHIELD | Admitting: Internal Medicine

## 2014-09-02 ENCOUNTER — Ambulatory Visit (INDEPENDENT_AMBULATORY_CARE_PROVIDER_SITE_OTHER): Payer: BLUE CROSS/BLUE SHIELD | Admitting: Internal Medicine

## 2014-09-02 ENCOUNTER — Encounter: Payer: Self-pay | Admitting: Internal Medicine

## 2014-09-02 VITALS — BP 153/68 | HR 57 | Temp 98.0°F | Resp 16 | Ht 71.0 in | Wt 162.3 lb

## 2014-09-02 DIAGNOSIS — F17201 Nicotine dependence, unspecified, in remission: Secondary | ICD-10-CM

## 2014-09-02 DIAGNOSIS — K0510 Chronic gingivitis, plaque induced: Secondary | ICD-10-CM | POA: Insufficient documentation

## 2014-09-02 DIAGNOSIS — F17200 Nicotine dependence, unspecified, uncomplicated: Secondary | ICD-10-CM

## 2014-09-02 DIAGNOSIS — R042 Hemoptysis: Secondary | ICD-10-CM

## 2014-09-02 LAB — CBC WITH DIFFERENTIAL/PLATELET
Basophils Absolute: 0.1 10*3/uL (ref 0.0–0.1)
Basophils Relative: 1 % (ref 0–1)
Eosinophils Absolute: 0.2 10*3/uL (ref 0.0–0.7)
Eosinophils Relative: 3 % (ref 0–5)
HCT: 33.7 % — ABNORMAL LOW (ref 39.0–52.0)
Hemoglobin: 11.4 g/dL — ABNORMAL LOW (ref 13.0–17.0)
Lymphocytes Relative: 25 % (ref 12–46)
Lymphs Abs: 1.9 10*3/uL (ref 0.7–4.0)
MCH: 29.4 pg (ref 26.0–34.0)
MCHC: 33.8 g/dL (ref 30.0–36.0)
MCV: 86.9 fL (ref 78.0–100.0)
MPV: 9 fL (ref 8.6–12.4)
Monocytes Absolute: 0.8 10*3/uL (ref 0.1–1.0)
Monocytes Relative: 11 % (ref 3–12)
Neutro Abs: 4.4 10*3/uL (ref 1.7–7.7)
Neutrophils Relative %: 60 % (ref 43–77)
Platelets: 290 10*3/uL (ref 150–400)
RBC: 3.88 MIL/uL — ABNORMAL LOW (ref 4.22–5.81)
RDW: 14.7 % (ref 11.5–15.5)
WBC: 7.4 10*3/uL (ref 4.0–10.5)

## 2014-09-02 NOTE — Progress Notes (Signed)
Subjective:    Patient ID: William Perkins, male    DOB: [birth year], 64 y.o.   MRN: [id number]  HPI William Perkins is a 64 yo male with PMHx of HTN, CKD, HLD, tobacco abuse (quit 9/15), and GERD who presents with acute complaint of spitting of blood. Please see problem oriented assessment and plan for more information.  Review of Systems General: Denies fever, chills, fatigue, change in appetite and diaphoresis.  HEENT: Admits to poor dentition. Denies mouth pain, foul odor or bad taste. Respiratory: Denies SOB, cough  Cardiovascular: Denies chest pain and palpitations.  Gastrointestinal: Denies nausea, vomiting, abdominal pain MSK: Denies lower extremity edema or calf tenderness.  Neurological: Denies dizziness, headaches, weakness, lightheadedness   Past Medical History  Diagnosis Date  . Hypertension   . Left ventricular hypertrophy   . Renal insufficiency   . Leg pain, left 11/15/2009  . Hand numbness 10/07/2009  . Soft tissue disorder 08/2008  . Hypertension 03/22/2006  . Hyperlipidemia 03/22/2008  . Chronic renal insufficiency 03/21/2006    CrCl <50 ml  . Left ventricular hypertrophy 03/22/2006  . Tobacco abuse 03/22/2006    2 ppd x 30 years  . GERD (gastroesophageal reflux disease) 03/22/2006  . Lumbar strain 07/15/2008  . DDD (degenerative disc disease), cervical    Outpatient Encounter Prescriptions as of 09/02/2014  Medication Sig Note  . acetaminophen (TYLENOL) 325 MG tablet Take 1 tablet (325 mg total) by mouth every 6 (six) hours as needed.   Marland Kitchen amLODipine (NORVASC) 10 MG tablet Take 1 tablet (10 mg total) by mouth daily.   . cloNIDine (CATAPRES) 0.1 MG tablet Take 1 tablet (0.1 mg total) by mouth 2 (two) times daily. 06/25/2014: Taking 0.2 mg qhs   . cyclobenzaprine (FLEXERIL) 5 MG tablet Take 1 tablet (5 mg total) by mouth at bedtime as needed for muscle spasms. (Patient not taking: Reported on 06/25/2014)   . diclofenac sodium (VOLTAREN) 1 % GEL Apply 2 g topically 4  (four) times daily as needed. (Patient not taking: Reported on 06/25/2014)   . furosemide (LASIX) 20 MG tablet Take 1 tablet (20 mg total) by mouth daily.   . hydrALAZINE (APRESOLINE) 50 MG tablet Take 1 tablet (50 mg total) by mouth 2 (two) times daily. 06/25/2014: Taking 100 mg qhs   . lisinopril (PRINIVIL,ZESTRIL) 40 MG tablet Take 1 tablet (40 mg total) by mouth daily.   Marland Kitchen omeprazole (PRILOSEC) 40 MG capsule TAKE ONE CAPSULE BY MOUTH ONCE DAILY   . pravastatin (PRAVACHOL) 40 MG tablet Take 1 tablet (40 mg total) by mouth daily.   . varenicline (CHANTIX) 0.5 MG tablet Take 1 tablet (0.5 mg total) by mouth 2 (two) times daily. 0.5 once a day for 3 days, then 0.5 mg bid for 4 days, then 1 mg bid x 11 weeks.    No facility-administered encounter medications on file as of 09/02/2014.      Objective:   Physical Exam Filed Vitals:   09/02/14 1355  BP: 153/68  Pulse: 57  Temp: 98 F (36.7 C)  TempSrc: Oral  Resp: 16  Height: 5\' 11"  (1.803 m)  Weight: 162 lb 4.8 oz (73.619 kg)  SpO2: 99%   General: Vital signs reviewed.  Patient is well-developed and well-nourished, in no acute distress and cooperative with exam.  HEENT: Poor dentition. Multiple dental caries, missing and broken teeth. Bleeding noted at left inferior gingival borders against teeth. Blood also noted at tongue. No foul odor noted. No abscess. No tenderness. No  exudate.  Cardiovascular: RRR, S1 normal, S2 normal Pulmonary/Chest: Clear to auscultation bilaterally, no wheezes, rales, or rhonchi. Abdominal: Soft, non-tender, non-distended, BS +, no masses, organomegaly, or guarding present.  Extremities: No lower extremity edema bilaterally, pulses symmetric and intact bilaterally. No calf tenderness. Skin: Warm, dry and intact. No rashes or erythema. Psychiatric: Normal mood and affect. speech and behavior is normal. Cognition and memory are normal.     Assessment & Plan:   Please see problem oriented assessment and plan.

## 2014-09-02 NOTE — Assessment & Plan Note (Signed)
Quit September 2015.

## 2014-09-02 NOTE — Assessment & Plan Note (Signed)
Patient states 3 weeks ago he noticed dried blood on his sheets by his mouth. Last night, patient noticed a small amount of blood in his spit that has occurred intermittently until his appointment today. He denies any pain, fever, chills, foul breath, foul taste in mouth, cough, chest pain, shortness of breath, lower extremity swelling or calf tenderness. History and physical exam are consistent with gingival bleeding and gingivitis. There is no concern for DVT/PE, pneumonia, TB or lung malignancy at this time.   Plan: -Patient will follow up with dentist -CBC -Educated on warning signs of abscess and infection and to return if patient develops symptoms

## 2014-09-02 NOTE — Patient Instructions (Signed)
General Instructions:   Thank you for bringing your medicines today. This helps [place] keep you safe from mistakes.   PLEASE CALL [facility] FOR A LIST OF DENTAL PROVIDERS.  IF YOU DEVELOP PAIN, FOUL BREATH OR A BAD TASTE IN YOUR MOUTH, RETURN TO THE CLINIC AS YOU MAY HAVE A TOOTH INFECTION.  Gingivitis Gingivitis is a form of gum (periodontal) disease that causes redness, soreness, and swelling (inflammation) of your gums. CAUSES The most common cause of gingivitis is poor oral hygiene. A sticky substance made of bacteria, mucus, and food particles (plaque), is deposited on the exposed part of teeth. As plaque builds up, it reacts with the saliva in your mouth to form something called  tartar. Tartar is a hard deposit that becomes trapped around the base of the tooth. Plaque and tartar irritate the gums, leading to the formation of gingivitis. Other factors that increase your risk for gingivitis include:   Tobacco use.  Diabetes.  Older age.  Certain medications.  Certain viral or fungal infections.  Dry mouth.  Hormonal changes such as during pregnancy.  Poor nutrition.  Substance abuse.  Poor fitting dental restorations or appliances. SYMPTOMS You may notice inflammation of the soft tissue (gingiva) around the teeth. When these tissues become inflamed, they bleed easily, especially during flossing or brushing. The gums may also be:   Tender to the touch.  Bright red, purple red, or have a shiny appearance.  Swollen.  Wearing away from the teeth (receding), which exposes more of the tooth. Bad breath is often present. Continued infection around teeth can eventually cause cavities and loosen teeth. This may lead to eventual tooth loss. DIAGNOSIS A medical and dental history will be taken. Your mouth, teeth, and gums will be examined. Your dentist will look for soft, swollen purple-red, irritated gums. There may be deposits of plaque and tartar at the base of the  teeth. Your gums will be looked at for the degree of redness, puffiness, and bleeding tendencies. Your dentist will see if any of the teeth are loose. X-rays may be taken to see if the inflammation has spread to the supporting structures of the teeth. TREATMENT The goal is to reduce and reverse the inflammation. Proper treatment can usually reverse the symptoms of gingivitis and prevent further progression of the disease. Have your teeth cleaned. During the cleaning, all plaque and tartar will be removed. Instruction for proper home care will be given. You will need regular professional cleanings and check-ups in the future. HOME CARE INSTRUCTIONS  Brush your teeth twice a day and floss at least once per day. When flossing, it is best to floss first then brush.  Limit sugar between meals and maintain a well-balanced diet.  Even the best dental hygiene will not prevent plaque from developing. It is necessary for you to see your dentist on a regular basis for cleaning and regular checkups.  Your dentist can recommend proper oral hygiene and mouth care and suggest special toothpastes or mouth rinses.  Stop smoking. SEEK DENTAL OR MEDICAL CARE IF:  You have painful, reddened tissue around your teeth, or you have puffy swollen gums.  You have difficulty chewing.  You notice any loose or infected teeth.  You have swollen glands.  Your gums bleed easily when you brush your teeth or are very tender to the touch. Document Released: 08/15/2000 Document Revised: 05/14/2011 Document Reviewed: 05/26/2010 [facility] Patient Information 2015 [place], [place]. This information is not intended to replace advice given  to you by your health care provider. Make sure you discuss any questions you have with your health care provider.  

## 2014-09-03 NOTE — Progress Notes (Signed)
Internal Medicine Clinic Attending  Case discussed with Dr. Richardson soon after the resident saw the patient.  We reviewed the resident's history and exam and pertinent patient test results.  I agree with the assessment, diagnosis, and plan of care documented in the resident's note. 

## 2014-09-29 ENCOUNTER — Other Ambulatory Visit: Payer: Self-pay | Admitting: Internal Medicine

## 2014-10-14 ENCOUNTER — Other Ambulatory Visit: Payer: Self-pay | Admitting: Internal Medicine

## 2014-10-14 ENCOUNTER — Other Ambulatory Visit: Payer: Self-pay | Admitting: *Deleted

## 2014-10-14 DIAGNOSIS — K219 Gastro-esophageal reflux disease without esophagitis: Secondary | ICD-10-CM

## 2014-10-14 DIAGNOSIS — I10 Essential (primary) hypertension: Secondary | ICD-10-CM

## 2014-10-14 DIAGNOSIS — M79601 Pain in right arm: Secondary | ICD-10-CM

## 2014-10-14 MED ORDER — OMEPRAZOLE 40 MG PO CPDR: 40.0000 mg | DELAYED_RELEASE_CAPSULE | Freq: Every day | ORAL | Status: DC

## 2014-10-14 MED ORDER — PRAVASTATIN SODIUM 40 MG PO TABS: 40.0000 mg | ORAL_TABLET | Freq: Every day | ORAL | Status: DC

## 2014-10-14 MED ORDER — LISINOPRIL 40 MG PO TABS: 40.0000 mg | ORAL_TABLET | Freq: Every day | ORAL | Status: DC

## 2014-10-14 MED ORDER — AMLODIPINE BESYLATE 10 MG PO TABS: 10.0000 mg | ORAL_TABLET | Freq: Every day | ORAL | Status: DC

## 2014-10-14 MED ORDER — CLONIDINE HCL 0.1 MG PO TABS: 0.1000 mg | ORAL_TABLET | Freq: Two times a day (BID) | ORAL | Status: DC

## 2014-10-14 MED ORDER — ACETAMINOPHEN 325 MG PO TABS: 325.0000 mg | ORAL_TABLET | Freq: Four times a day (QID) | ORAL | Status: AC | PRN

## 2014-10-14 MED ORDER — FUROSEMIDE 20 MG PO TABS: 20.0000 mg | ORAL_TABLET | Freq: Every day | ORAL | Status: DC

## 2014-10-14 MED ORDER — HYDRALAZINE HCL 50 MG PO TABS: 50.0000 mg | ORAL_TABLET | Freq: Two times a day (BID) | ORAL | Status: DC

## 2014-10-14 NOTE — Telephone Encounter (Signed)
Will document on call

## 2014-10-14 NOTE — Telephone Encounter (Signed)
Pt called back requesting all meds to be filled. Please call pt back.

## 2014-10-14 NOTE — Telephone Encounter (Signed)
Pt called requesting meds to be filled. °

## 2014-10-15 ENCOUNTER — Telehealth: Payer: Self-pay | Admitting: Internal Medicine

## 2014-10-15 NOTE — Telephone Encounter (Signed)
Pt called back requesting the nurse to call back regarding his meds to be filled.

## 2014-10-15 NOTE — Telephone Encounter (Signed)
Called pt, he was rude in speaking to hcom, he stated he had a lot of trouble getting his medicine, i explained that he needs to keep his appts regular, call the pharm 3 to 5 days before running out of meds, after 48 hours has passed to call [facility] and expect answers to refills to take 48 hrs to give the md time to review his records and send responses to the pharm, he states he had wait because he called yeaterday, i explained that is not 48 hrs, that the md responded in less than 48 hrs and that he needs an appt and needs to bring his BP log, he states checks his BP but does not write it down, he is asked to start doing so and bring it with him to his appt. appt scheduled with dr Juleen China in mid nov- was his first available. Pt states he will probably forget, i told him he would receive a phone call.

## 2015-01-20 ENCOUNTER — Encounter: Payer: Self-pay | Admitting: Internal Medicine

## 2015-01-20 ENCOUNTER — Ambulatory Visit (INDEPENDENT_AMBULATORY_CARE_PROVIDER_SITE_OTHER): Payer: BLUE CROSS/BLUE SHIELD | Admitting: Internal Medicine

## 2015-01-20 VITALS — BP 149/71 | HR 65 | Temp 97.1°F | Wt 158.1 lb

## 2015-01-20 DIAGNOSIS — E785 Hyperlipidemia, unspecified: Secondary | ICD-10-CM | POA: Diagnosis not present

## 2015-01-20 DIAGNOSIS — K219 Gastro-esophageal reflux disease without esophagitis: Secondary | ICD-10-CM | POA: Diagnosis not present

## 2015-01-20 DIAGNOSIS — I10 Essential (primary) hypertension: Secondary | ICD-10-CM | POA: Diagnosis not present

## 2015-01-20 DIAGNOSIS — Z0000 Encounter for general adult medical examination without abnormal findings: Secondary | ICD-10-CM | POA: Diagnosis not present

## 2015-01-20 MED ORDER — HYDRALAZINE HCL 50 MG PO TABS: 50.0000 mg | ORAL_TABLET | Freq: Two times a day (BID) | ORAL | Status: DC

## 2015-01-20 MED ORDER — AMLODIPINE BESYLATE 10 MG PO TABS: 10.0000 mg | ORAL_TABLET | Freq: Every day | ORAL | Status: DC

## 2015-01-20 MED ORDER — CLONIDINE HCL 0.1 MG PO TABS: 0.1000 mg | ORAL_TABLET | Freq: Two times a day (BID) | ORAL | Status: DC

## 2015-01-20 MED ORDER — LISINOPRIL 40 MG PO TABS: 40.0000 mg | ORAL_TABLET | Freq: Every day | ORAL | Status: DC

## 2015-01-20 MED ORDER — FUROSEMIDE 20 MG PO TABS: 20.0000 mg | ORAL_TABLET | Freq: Every day | ORAL | Status: DC

## 2015-01-20 MED ORDER — OMEPRAZOLE 40 MG PO CPDR: 40.0000 mg | DELAYED_RELEASE_CAPSULE | Freq: Every day | ORAL | Status: DC

## 2015-01-20 MED ORDER — ATORVASTATIN CALCIUM 40 MG PO TABS: 40.0000 mg | ORAL_TABLET | Freq: Every day | ORAL | Status: DC

## 2015-01-20 NOTE — Progress Notes (Signed)
Patient ID: William Perkins, male   DOB: [birth year], 64 y.o.   MRN: [id number]   Subjective:   Patient ID: William Perkins male   DOB: 02-18-1951 64 y.o.   MRN: [id number] HPI: [name] is a 64 y.o. male with past medical history as noted below.  Patient presents today for follow up of his HTN, HLD, and healthcare maintenance.  Please see problem list for current status of patients chronic medical conditions.    Past Medical History  Diagnosis Date  . Hypertension   . Left ventricular hypertrophy   . Renal insufficiency   . Leg pain, left 11/15/2009  . Hand numbness 10/07/2009  . Soft tissue disorder 08/2008  . Hypertension 03/22/2006  . Hyperlipidemia 03/22/2008  . Chronic renal insufficiency 03/21/2006    CrCl <50 ml  . Left ventricular hypertrophy 03/22/2006  . Tobacco abuse 03/22/2006    2 ppd x 30 years  . GERD (gastroesophageal reflux disease) 03/22/2006  . Lumbar strain 07/15/2008  . DDD (degenerative disc disease), cervical    Current Outpatient Prescriptions  Medication Sig Dispense Refill  . acetaminophen (TYLENOL) 325 MG tablet Take 1 tablet (325 mg total) by mouth every 6 (six) hours as needed. 120 tablet 0  . amLODipine (NORVASC) 10 MG tablet Take 1 tablet (10 mg total) by mouth daily. 90 tablet 0  . atorvastatin (LIPITOR) 40 MG tablet Take 1 tablet (40 mg total) by mouth daily. 30 tablet 11  . cloNIDine (CATAPRES) 0.1 MG tablet Take 1 tablet (0.1 mg total) by mouth 2 (two) times daily. 180 tablet 0  . furosemide (LASIX) 20 MG tablet Take 1 tablet (20 mg total) by mouth daily. 90 tablet 0  . hydrALAZINE (APRESOLINE) 50 MG tablet Take 1 tablet (50 mg total) by mouth 2 (two) times daily. 180 tablet 0  . lisinopril (PRINIVIL,ZESTRIL) 40 MG tablet Take 1 tablet (40 mg total) by mouth daily. 90 tablet 0  . omeprazole (PRILOSEC) 40 MG capsule Take 1 capsule (40 mg total) by mouth daily. 90 capsule 0  . cyclobenzaprine (FLEXERIL) 5 MG tablet Take 1 tablet (5 mg total) by  mouth at bedtime as needed for muscle spasms. (Patient not taking: Reported on 06/25/2014) 30 tablet 0  . diclofenac sodium (VOLTAREN) 1 % GEL Apply 2 g topically 4 (four) times daily as needed. (Patient not taking: Reported on 06/25/2014) 100 g 0  . varenicline (CHANTIX) 0.5 MG tablet Take 1 tablet (0.5 mg total) by mouth 2 (two) times daily. 0.5 once a day for 3 days, then 0.5 mg bid for 4 days, then 1 mg bid x 11 weeks. (Patient not taking: Reported on 01/20/2015) 160 tablet 1  . [DISCONTINUED] lisinopril-hydrochlorothiazide (PRINZIDE,ZESTORETIC) 20-12.5 MG per tablet Take 2 tablets by mouth daily.     No current facility-administered medications for this visit.   Family History  Problem Relation Age of Onset  . Diabetes Mother   . Diabetes Sister   . Diabetes Brother   . Heart disease Brother    Social History   Social History  . Marital Status: Married    Spouse Name: N/A  . Number of Children: N/A  . Years of Education: N/A   Social History Main Topics  . Smoking status: Former Smoker -- 1.00 packs/day for 44 years    Types: Cigarettes    Quit date: 11/21/2013  . Smokeless tobacco: None     Comment: Stopped 11/21/2013   . Alcohol Use: No     Comment: former  .  Drug Use: No  . Sexual Activity: Not Asked   Other Topics Concern  . None   Social History Narrative   Review of Systems: Review of Systems  Constitutional: Negative for fever and chills.  Eyes: Negative for blurred vision.  Respiratory: Negative for cough.   Cardiovascular: Negative for chest pain.  Gastrointestinal: Negative for heartburn and abdominal pain.  Genitourinary: Negative for dysuria.  Musculoskeletal: Negative for falls.  Skin: Negative for rash.  Neurological: Negative for dizziness, focal weakness and headaches.  Psychiatric/Behavioral: Negative for depression.    Objective:  Physical Exam: Filed Vitals:   01/20/15 1538  BP: 149/71  Pulse: 65  Temp: 97.1 F (36.2 C)  TempSrc: Oral    Weight: 158 lb 1.6 oz (71.714 kg)  SpO2: 98%   Physical Exam  Constitutional: He is oriented to person, place, and time and well-developed, well-nourished, and in no distress.  HENT:  Head: Normocephalic and atraumatic.  Eyes: Conjunctivae and EOM are normal.  Neck: Normal range of motion.  Cardiovascular: Normal rate and regular rhythm.   Pulmonary/Chest: Effort normal and breath sounds normal.  Abdominal: Soft. There is no tenderness.  Musculoskeletal: Normal range of motion.  Neurological: He is alert and oriented to person, place, and time.  Skin: Skin is warm and dry.  Psychiatric: Affect normal.    Assessment & Plan:   Please see Problem List for Assessment and Plan.

## 2015-01-20 NOTE — Assessment & Plan Note (Signed)
BP Readings from Last 3 Encounters:  01/20/15 149/71  09/02/14 153/68  06/24/14 160/60    Lab Results  Component Value Date   NA 137 06/24/2014   K 4.0 06/24/2014   CREATININE 1.78* 06/24/2014    Assessment: Blood pressure control:  slightly elevated above goal of 140/90 Progress toward BP goal:   improved Comments: BP elevated slightly above goal but patient brought home BP log with readings ranging from 112-137/53-67  Plan: Medications:  continue current medications: Amlodipine 10mg  daily, Clonidine 0.1 daily, Lasix 20mg  daily, Hydralazine 50 mg BID, Lisinopril 40 mg daily Other plans: encouraged patient to continue taking BP meds as prescribed and to continue to keep BP log for future visits.  Discussed with patient that in the future we can attempt to maximize therapy of one medication like Hydralazine and discontinue another that is not at max therapy (Clonidine) in order to maximize compliance.  Patient agreeable to this in the future.

## 2015-01-20 NOTE — Assessment & Plan Note (Signed)
Assessment: Last lipid panel was in Nov 2015 with TC 247 and LDL 158.  ASCVD risk is 20% and recommendation is moderate-high intensity statin.  Patient currently on Pravastatin 40mg  daily.  Plan: - Change to high intensity statin: Atorvastatin 40mg  daily - Check CMP today and Lipid Panel today.  Will see patient back in approximately 8-12 weeks to recheck LFTs and lipids to ensure adequate response to statin change.  If not adequate, will consider change to 80mg  Atorvastatin assuming patient tolerating okay.

## 2015-01-20 NOTE — Assessment & Plan Note (Signed)
Assessment: Patient due for healthcare maintenance today.  Plan: - Declines influenza and pneumonia vaccine today - Declines referral for screening colonoscopy as patient has never had after age 64 - Check HIV and HCV - Will consider low dose CT in the future given history of smoking.  Patient quit last year.

## 2015-01-20 NOTE — Patient Instructions (Signed)
Thank you for bringing your medicines today. This helps [place] keep you safe from mistakes.  It was nice seeing you today.  I am checking some blood work today.  We are checking your cholesterol and your kidney function.  Please stop taking Pravastatin for your cholesterol.  I have sent a new prescription to your pharmacy called Atorvastatin.  Please take this medication every day.  In the future, we will work on your blood pressure and try to get you on less medications.  Take care.

## 2015-01-21 LAB — LIPID PANEL
Chol/HDL Ratio: 3.9 ratio units (ref 0.0–5.0)
Cholesterol, Total: 167 mg/dL (ref 100–199)
HDL: 43 mg/dL (ref >39)
LDL Calculated: 97 mg/dL (ref 0–99)
Triglycerides: 135 mg/dL (ref 0–149)
VLDL Cholesterol Cal: 27 mg/dL (ref 5–40)

## 2015-01-21 LAB — CMP14 + ANION GAP
ALT: 17 IU/L (ref 0–44)
AST: 19 IU/L (ref 0–40)
Albumin/Globulin Ratio: 1.5 (ref 1.1–2.5)
Albumin: 4.2 g/dL (ref 3.6–4.8)
Alkaline Phosphatase: 74 IU/L (ref 39–117)
Anion Gap: 20 mmol/L — ABNORMAL HIGH (ref 10.0–18.0)
BUN/Creatinine Ratio: 12 (ref 10–22)
BUN: 25 mg/dL (ref 8–27)
Bilirubin Total: 0.4 mg/dL (ref 0.0–1.2)
CO2: 20 mmol/L (ref 18–29)
Calcium: 9.5 mg/dL (ref 8.6–10.2)
Chloride: 95 mmol/L — ABNORMAL LOW (ref 97–106)
Creatinine, Ser: 2.01 mg/dL — ABNORMAL HIGH (ref 0.76–1.27)
GFR calc Af Amer: 39 mL/min/{1.73_m2} — ABNORMAL LOW (ref >59)
GFR calc non Af Amer: 34 mL/min/{1.73_m2} — ABNORMAL LOW (ref >59)
Globulin, Total: 2.8 g/dL (ref 1.5–4.5)
Glucose: 372 mg/dL — ABNORMAL HIGH (ref 65–99)
Potassium: 4.7 mmol/L (ref 3.5–5.2)
Sodium: 135 mmol/L — ABNORMAL LOW (ref 136–144)
Total Protein: 7 g/dL (ref 6.0–8.5)

## 2015-01-21 LAB — HEPATITIS C ANTIBODY: Hep C Virus Ab: <0.1 s/co ratio (ref 0.0–0.9)

## 2015-01-21 LAB — HIV ANTIBODY (ROUTINE TESTING W REFLEX): HIV Screen 4th Generation wRfx: NONREACTIVE

## 2015-01-21 NOTE — Addendum Note (Signed)
Addended by: Mignon Pine on: 01/21/2015 11:10 AM   Modules accepted: Orders

## 2015-01-24 NOTE — Progress Notes (Signed)
Internal Medicine Clinic Attending  I saw and evaluated the patient.  I personally confirmed the key portions of the history and exam documented by Dr. Wallace and I reviewed pertinent patient test results.  The assessment, diagnosis, and plan were formulated together and I agree with the documentation in the resident's note. 

## 2015-02-10 ENCOUNTER — Ambulatory Visit (INDEPENDENT_AMBULATORY_CARE_PROVIDER_SITE_OTHER): Payer: BLUE CROSS/BLUE SHIELD | Admitting: Internal Medicine

## 2015-02-10 ENCOUNTER — Encounter: Payer: Self-pay | Admitting: Internal Medicine

## 2015-02-10 VITALS — BP 145/73 | HR 57 | Temp 98.3°F | Wt 150.6 lb

## 2015-02-10 DIAGNOSIS — E1122 Type 2 diabetes mellitus with diabetic chronic kidney disease: Secondary | ICD-10-CM | POA: Diagnosis not present

## 2015-02-10 DIAGNOSIS — R7309 Other abnormal glucose: Secondary | ICD-10-CM

## 2015-02-10 DIAGNOSIS — E785 Hyperlipidemia, unspecified: Secondary | ICD-10-CM

## 2015-02-10 DIAGNOSIS — N183 Chronic kidney disease, stage 3 unspecified: Secondary | ICD-10-CM | POA: Insufficient documentation

## 2015-02-10 LAB — GLUCOSE, CAPILLARY: Glucose-Capillary: 432 mg/dL — ABNORMAL HIGH (ref 65–99)

## 2015-02-10 LAB — POCT GLYCOSYLATED HEMOGLOBIN (HGB A1C): Hemoglobin A1C: 11.7

## 2015-02-10 MED ORDER — METFORMIN HCL 1000 MG PO TABS: 1000.0000 mg | ORAL_TABLET | Freq: Two times a day (BID) | ORAL | Status: DC

## 2015-02-10 MED ORDER — PRAVASTATIN SODIUM 40 MG PO TABS: 40.0000 mg | ORAL_TABLET | Freq: Every evening | ORAL | Status: DC

## 2015-02-10 NOTE — Assessment & Plan Note (Signed)
Assessment: Patient was changed to Atorvastatin last month due to ASCVD recommendation for high intensity statin.  States today that he is not tolerating this medication well and complaining of muscle ache and fatigue.  Unsure if this is related to atorvastatin or uncontrolled glucose.   States he did not have these symptoms previously on Pravastatin 40mg .  Lipid panel obtained on 01/20/15 (on Pravastatin) was TC 167, HDL 43, and LDL 97.  Overall, this is improved from previous lipid panel 1 year prior.  However, patient ASCVD 10-year risk with new diagnosis of DM is 33.3%, recommendation to consider a high-intensity statin.  Plan: - stop atorvastatin 40mg  daily due to side effects - restart pravastatin 40mg  daily

## 2015-02-10 NOTE — Patient Instructions (Signed)
Please schedule appointment with Debera Lat, our Diabetes Educator.  Please schedule appointment to come back and see me in 2-4 weeks to see how you are doing on the Metformin.  Please follow the directions on how to take this medicine until you get to 1000mg  twice daily.  STOP taking Atorvastatin.   START taking Pravastatin again.  I resent a prescription to your pharmacy.  For your diabetes, we will continue to keep an eye on how you respond to Metformin but we may need to consider Insulin in the future.  In the meantime, make the appropriate diet and exercise changes that we discussed.  Take care.

## 2015-02-10 NOTE — Progress Notes (Signed)
Patient ID: William Perkins, male   DOB: 02-Jul-1950, 64 y.o.   MRN: [id number]   Subjective:   Patient ID: William Perkins male   DOB: 1951/02/14 64 y.o.   MRN: [id number]  HPI: [name] is a 64 y.o. male with past medical history as outlined below who presents today for follow up of elevated glucose on CMP and hyperlipidemia.  Please see assessment and plan for patients chronic medical conditions addressed this visit.    Past Medical History  Diagnosis Date  . Hypertension   . Left ventricular hypertrophy   . Renal insufficiency   . Leg pain, left 11/15/2009  . Hand numbness 10/07/2009  . Soft tissue disorder 08/2008  . Hypertension 03/22/2006  . Hyperlipidemia 03/22/2008  . Chronic renal insufficiency 03/21/2006    CrCl <50 ml  . Left ventricular hypertrophy 03/22/2006  . Tobacco abuse 03/22/2006    2 ppd x 30 years  . GERD (gastroesophageal reflux disease) 03/22/2006  . Lumbar strain 07/15/2008  . DDD (degenerative disc disease), cervical    Current Outpatient Prescriptions  Medication Sig Dispense Refill  . acetaminophen (TYLENOL) 325 MG tablet Take 1 tablet (325 mg total) by mouth every 6 (six) hours as needed. 120 tablet 0  . amLODipine (NORVASC) 10 MG tablet Take 1 tablet (10 mg total) by mouth daily. 90 tablet 0  . cloNIDine (CATAPRES) 0.1 MG tablet Take 1 tablet (0.1 mg total) by mouth 2 (two) times daily. 180 tablet 0  . cyclobenzaprine (FLEXERIL) 5 MG tablet Take 1 tablet (5 mg total) by mouth at bedtime as needed for muscle spasms. (Patient not taking: Reported on 06/25/2014) 30 tablet 0  . diclofenac sodium (VOLTAREN) 1 % GEL Apply 2 g topically 4 (four) times daily as needed. (Patient not taking: Reported on 06/25/2014) 100 g 0  . furosemide (LASIX) 20 MG tablet Take 1 tablet (20 mg total) by mouth daily. 90 tablet 0  . hydrALAZINE (APRESOLINE) 50 MG tablet Take 1 tablet (50 mg total) by mouth 2 (two) times daily. 180 tablet 0  . lisinopril (PRINIVIL,ZESTRIL) 40 MG  tablet Take 1 tablet (40 mg total) by mouth daily. 90 tablet 0  . metFORMIN (GLUCOPHAGE) 1000 MG tablet Take 1 tablet (1,000 mg total) by mouth 2 (two) times daily with a meal. 60 tablet 11  . omeprazole (PRILOSEC) 40 MG capsule Take 1 capsule (40 mg total) by mouth daily. 90 capsule 0  . pravastatin (PRAVACHOL) 40 MG tablet Take 1 tablet (40 mg total) by mouth every evening. 30 tablet 11  . varenicline (CHANTIX) 0.5 MG tablet Take 1 tablet (0.5 mg total) by mouth 2 (two) times daily. 0.5 once a day for 3 days, then 0.5 mg bid for 4 days, then 1 mg bid x 11 weeks. (Patient not taking: Reported on 01/20/2015) 160 tablet 1  . [DISCONTINUED] lisinopril-hydrochlorothiazide (PRINZIDE,ZESTORETIC) 20-12.5 MG per tablet Take 2 tablets by mouth daily.     No current facility-administered medications for this visit.   Family History  Problem Relation Age of Onset  . Diabetes Mother   . Diabetes Sister   . Diabetes Brother   . Heart disease Brother    Social History   Social History  . Marital Status: Married    Spouse Name: N/A  . Number of Children: N/A  . Years of Education: N/A   Social History Main Topics  . Smoking status: Former Smoker -- 1.00 packs/day for 44 years    Types: Cigarettes    Quit  date: 11/21/2013  . Smokeless tobacco: None     Comment: Stopped 11/21/2013   . Alcohol Use: No     Comment: former  . Drug Use: No  . Sexual Activity: Not Asked   Other Topics Concern  . None   Social History Narrative   Review of Systems: Review of Systems  Constitutional: Positive for malaise/fatigue. Negative for fever and chills.  HENT: Negative for congestion and sore throat.   Eyes: Positive for blurred vision.  Respiratory: Negative for cough and shortness of breath.   Cardiovascular: Negative for chest pain and leg swelling.  Gastrointestinal: Negative for nausea, vomiting, abdominal pain, diarrhea and constipation.  Genitourinary: Negative for dysuria.  Musculoskeletal:  Negative for back pain and falls.  Skin: Negative for rash.  Neurological: Negative for dizziness, loss of consciousness and headaches.  Psychiatric/Behavioral: Negative for depression.    Objective:  Physical Exam: Filed Vitals:   02/10/15 1544  BP: 145/73  Pulse: 57  Temp: 98.3 F (36.8 C)  TempSrc: Oral  Weight: 150 lb 9.6 oz (68.312 kg)  SpO2: 100%   Physical Exam  Constitutional: He is oriented to person, place, and time. He appears well-developed and well-nourished. No distress.  HENT:  Head: Normocephalic and atraumatic.  Eyes: EOM are normal.  Neck: Normal range of motion.  Cardiovascular: Normal rate and regular rhythm.   Pulmonary/Chest: Effort normal and breath sounds normal.  Abdominal: Soft. There is no tenderness.  Musculoskeletal: Normal range of motion. He exhibits no edema.  Neurological: He is alert and oriented to person, place, and time.  Skin: Skin is warm and dry.  Psychiatric: He has a normal mood and affect.    Assessment & Plan:   Please see problem list for assessment and plan.  Case discussed with Dr. Daryll Drown.

## 2015-02-10 NOTE — Assessment & Plan Note (Addendum)
Assessment:  Patient seen in clinic last month and glucose on CMP was measured at 372.  I asked patient to return to clinic for an Hgb A1C measurement.  Today, his CBG is 432 and A1C is 11.7.  Last A1C was checked in May 2015 and was 5.0.  He reports recent polydipsia, polyuria, and general fatigue and malaise.  He and his wife report frequent dietary indiscretion with fried foods, fast food, and sugary sweets.    Plan: - discussed with patient medication options and that the severity of his newly diagnosed diabetes likely warrants insulin therapy.  Patient expressed his concerns of being able to comply with giving himself insulin.  Due to this concern, we discussed doing a 3 month trial of oral medications to try to get his DM under better control.  I explained that compliance to medication, diet and exercise modification would be imperative for glycemic control - start metformin today.  Over the next 4 weeks, patient will titrate up to 1000mg  bid - referral to diabetes educator Butch Penny Plyler) placed today and patient has follow up with her on Dec 16 - provided educational materials to patient today - did not provide meter, but will send message to [employer]  about possibly providing this to patient at his appointment - RTC 2-3 weeks - due to fairly rapid onset of diabetes, if response to metformin and lifestyle modifications is poor, we should consider autoimmune etiology

## 2015-02-17 NOTE — Progress Notes (Signed)
Internal Medicine Clinic Attending  I saw and evaluated the patient.  I personally confirmed the key portions of the history and exam documented by Dr. Wallace and I reviewed pertinent patient test results.  The assessment, diagnosis, and plan were formulated together and I agree with the documentation in the resident's note. 

## 2015-02-18 ENCOUNTER — Encounter: Payer: Self-pay | Admitting: Dietician

## 2015-02-18 ENCOUNTER — Other Ambulatory Visit: Payer: Self-pay | Admitting: Dietician

## 2015-02-18 ENCOUNTER — Ambulatory Visit: Payer: BLUE CROSS/BLUE SHIELD | Admitting: Dietician

## 2015-02-18 DIAGNOSIS — N183 Chronic kidney disease, stage 3 unspecified: Secondary | ICD-10-CM

## 2015-02-18 DIAGNOSIS — E1122 Type 2 diabetes mellitus with diabetic chronic kidney disease: Secondary | ICD-10-CM

## 2015-02-18 MED ORDER — LANCETS MICRO THIN 33G MISC: Status: DC

## 2015-02-18 MED ORDER — GLUCOSE BLOOD VI STRP: ORAL_STRIP | Status: DC

## 2015-02-18 NOTE — Progress Notes (Signed)
  Medical Nutrition Therapy:  Appt start time: 1400 end time:  1500. Visit # 1 series of at least 6 likely needed   Assessment:  Primary concerns today: glycemic control.  Patient is here with his supportive wife for diabetes meal planning and general training including a meter and how to use it. His insurance coverage for diabetes training , MNT and diabetes supplies was explained along with offer to refer them to classes. He reports he does not want to go on insulin and that his brother just started using an insulin pump. He was abel to check his blood sugar with assistance today and feels confident that he will be abel to do this at home.   Preferred Learning Style: No preference indicated- any and all Learning Readiness: Ready and Change in progress  ANTHROPOMETRICS: weight- not done today, height-71", BMI-21 low normal limits WEIGHT HISTORY:150-160# for a long time SLEEP:not discussed today MEDICATIONS: metformin 500 mg daily, there seems to be some confusion about his dose, he says last week he just started on a half of a pill per day BLOOD SUGAR: CBG on his new meter was 229 6 hours after breakfast DIETARY INTAKE: 24-hr recall: Not completed today.   B (830 AM): grits(~20), eggs, bacon, biscuit(~30g), 16-20 oz 1 % milk(~30 g) L ( PM): skipped today Beverages: water, soda, 1% milk  Usual physical activity: active on the job, CDE encouraged 30-60 minutes walk on days off.   Estimated daily energy needs: 2000 calories 225 g carbohydrates/day   Progress Towards Goal(s):  In progress.   Nutritional Diagnosis:  NB-1.1 Food and nutrition-related knowledge deficit As related to lack of diabetes training .  As evidenced by their report and questions.    Intervention:  Nutrition education on what is diabetes, type 1 vs type 2, basic of meal planning. Symptoms of hyper glycemia and how to use a meter and record values. . Coordination of care: request testing supply rx for  patient Teaching Method Utilized: Visual, Auditory, Hands on Handouts given during visit include:avs, carb counting and meal planing booklet Barriers to learning/adherence to lifestyle change: none noted today Demonstrated degree of understanding via:  Teach Back   Monitoring/Evaluation:  Dietary intake, exercise,meter, and body weight in 4 week(s).

## 2015-02-18 NOTE — Patient Instructions (Signed)
Please make a follow up appointment for 3-4 weeks. Around January 15-20th.  Check your blood sugar about 1 time a day and write value in the book as discussed.   Try to eat at least 3 times a day spreading your eating times out by not more than 5-5 hours: For example: 830 am, 130 PM, 6-630 PM.  Try to eat well balanced meals:   about 2 starchy food choices                                                              1 protein choice                                                              1 fruit choice                                                             1-2 vegetable choices                                                              1 dairy choice           1-2 healthy fat choices  Call anytime!  Butch Penny [phone]

## 2015-02-18 NOTE — Telephone Encounter (Signed)
Request testing supplies for new Freestyle lite meter giving to patient today

## 2015-03-17 ENCOUNTER — Ambulatory Visit (INDEPENDENT_AMBULATORY_CARE_PROVIDER_SITE_OTHER): Payer: BLUE CROSS/BLUE SHIELD | Admitting: Dietician

## 2015-03-17 ENCOUNTER — Ambulatory Visit (INDEPENDENT_AMBULATORY_CARE_PROVIDER_SITE_OTHER): Payer: BLUE CROSS/BLUE SHIELD | Admitting: Internal Medicine

## 2015-03-17 VITALS — BP 136/68 | HR 60 | Temp 98.2°F

## 2015-03-17 DIAGNOSIS — Z7984 Long term (current) use of oral hypoglycemic drugs: Secondary | ICD-10-CM

## 2015-03-17 DIAGNOSIS — Z713 Dietary counseling and surveillance: Secondary | ICD-10-CM | POA: Diagnosis not present

## 2015-03-17 DIAGNOSIS — N183 Chronic kidney disease, stage 3 unspecified: Secondary | ICD-10-CM

## 2015-03-17 DIAGNOSIS — I10 Essential (primary) hypertension: Secondary | ICD-10-CM

## 2015-03-17 DIAGNOSIS — I129 Hypertensive chronic kidney disease with stage 1 through stage 4 chronic kidney disease, or unspecified chronic kidney disease: Secondary | ICD-10-CM | POA: Diagnosis not present

## 2015-03-17 DIAGNOSIS — Z0000 Encounter for general adult medical examination without abnormal findings: Secondary | ICD-10-CM

## 2015-03-17 DIAGNOSIS — Z87891 Personal history of nicotine dependence: Secondary | ICD-10-CM

## 2015-03-17 DIAGNOSIS — F17200 Nicotine dependence, unspecified, uncomplicated: Secondary | ICD-10-CM

## 2015-03-17 DIAGNOSIS — E119 Type 2 diabetes mellitus without complications: Secondary | ICD-10-CM

## 2015-03-17 DIAGNOSIS — E1122 Type 2 diabetes mellitus with diabetic chronic kidney disease: Secondary | ICD-10-CM | POA: Diagnosis not present

## 2015-03-17 DIAGNOSIS — E785 Hyperlipidemia, unspecified: Secondary | ICD-10-CM

## 2015-03-17 MED ORDER — REPAGLINIDE 0.5 MG PO TABS
0.5000 mg | ORAL_TABLET | ORAL | Status: DC | PRN
Start: 2015-03-17 — End: 2015-10-28

## 2015-03-17 NOTE — Assessment & Plan Note (Signed)
Assessment: Patients BP today is 136/68, which is better than previous readings.  Given his newly diagnosed diabetes mellitus, goal BP will be closer to <130/90 so we are getting close to this.  Patient states he occasionally checks BP at home but did not record the readings.  Plan: - Continue with current meds: Amlodipine 10mg  daily, Clonidine 0.1 daily, Lasix 20mg  daily, Hydralazine 50 mg BID, Lisinopril 40 mg daily.  In the future, we discussed tapering off one medication and increasing the dose of another in order to decrease pill burden.  I did not want to do this today given that we made some other medication changes, but patient is agreeable - Encouraged patient to keep BP log and bring to next visit - Encouraged diet (decreased salt) and exercise to help with BP

## 2015-03-17 NOTE — Assessment & Plan Note (Signed)
Assessment: Patient doing very well since recently being diagnosed with diabetes last month.  He is checking his glucose daily (averaging approx 150), has met with Butch Penny Plyler twice, and continues to be proactive in helping to manage this.  He is tolerating metformin but is only taking 541m BID.  He occasionally will indulge in high sugar foods and notices his blood sugar being higher at those times.  Plan: - increase metformin to 10063mBID - start repaglinide 0.55m40ms needed when eating higher sugar food - foot exam done today - order placed for retinal photographs

## 2015-03-17 NOTE — Progress Notes (Signed)
Patient ID: William Perkins, male   DOB: 04-18-1950, 65 y.o.   MRN: [id number]   Subjective:   Patient ID: William Perkins male   DOB: Jul 20, 1950 65 y.o.   MRN: [id number]  HPI: William Perkins is a 65 y.o. man with past medical history as detailed below who presents today for follow up of his newly diagnosed diabetes mellitus.  Please see A&P for status of patients chronic medical conditions addressed at today's visit.    Past Medical History  Diagnosis Date  . Hypertension   . Left ventricular hypertrophy   . Renal insufficiency   . Leg pain, left 11/15/2009  . Hand numbness 10/07/2009  . Soft tissue disorder 08/2008  . Hypertension 03/22/2006  . Hyperlipidemia 03/22/2008  . Chronic renal insufficiency 03/21/2006    CrCl <50 ml  . Left ventricular hypertrophy 03/22/2006  . Tobacco abuse 03/22/2006    2 ppd x 30 years  . GERD (gastroesophageal reflux disease) 03/22/2006  . Lumbar strain 07/15/2008  . DDD (degenerative disc disease), cervical    Current Outpatient Prescriptions  Medication Sig Dispense Refill  . acetaminophen (TYLENOL) 325 MG tablet Take 1 tablet (325 mg total) by mouth every 6 (six) hours as needed. 120 tablet 0  . amLODipine (NORVASC) 10 MG tablet Take 1 tablet (10 mg total) by mouth daily. 90 tablet 0  . cloNIDine (CATAPRES) 0.1 MG tablet Take 1 tablet (0.1 mg total) by mouth 2 (two) times daily. 180 tablet 0  . cyclobenzaprine (FLEXERIL) 5 MG tablet Take 1 tablet (5 mg total) by mouth at bedtime as needed for muscle spasms. (Patient not taking: Reported on 06/25/2014) 30 tablet 0  . diclofenac sodium (VOLTAREN) 1 % GEL Apply 2 g topically 4 (four) times daily as needed. (Patient not taking: Reported on 06/25/2014) 100 g 0  . furosemide (LASIX) 20 MG tablet Take 1 tablet (20 mg total) by mouth daily. 90 tablet 0  . glucose blood (FREESTYLE LITE) test strip Use to check blood sugar one time a day 50 each 7  . hydrALAZINE (APRESOLINE) 50 MG tablet Take 1 tablet (50 mg  total) by mouth 2 (two) times daily. 180 tablet 0  . LANCETS MICRO THIN 33G MISC Use to check blood sugar 1 time a day 100 each 3  . lisinopril (PRINIVIL,ZESTRIL) 40 MG tablet Take 1 tablet (40 mg total) by mouth daily. 90 tablet 0  . metFORMIN (GLUCOPHAGE) 1000 MG tablet Take 1 tablet (1,000 mg total) by mouth 2 (two) times daily with a meal. 60 tablet 11  . omeprazole (PRILOSEC) 40 MG capsule Take 1 capsule (40 mg total) by mouth daily. 90 capsule 0  . pravastatin (PRAVACHOL) 40 MG tablet Take 1 tablet (40 mg total) by mouth every evening. 30 tablet 11  . repaglinide (PRANDIN) 0.5 MG tablet Take 1 tablet (0.5 mg total) by mouth as needed. 30 tablet 5  . varenicline (CHANTIX) 0.5 MG tablet Take 1 tablet (0.5 mg total) by mouth 2 (two) times daily. 0.5 once a day for 3 days, then 0.5 mg bid for 4 days, then 1 mg bid x 11 weeks. (Patient not taking: Reported on 01/20/2015) 160 tablet 1  . [DISCONTINUED] lisinopril-hydrochlorothiazide (PRINZIDE,ZESTORETIC) 20-12.5 MG per tablet Take 2 tablets by mouth daily.     No current facility-administered medications for this visit.   Family History  Problem Relation Age of Onset  . Diabetes Mother   . Diabetes Sister   . Diabetes Brother   . Heart disease Brother  Social History   Social History  . Marital Status: Married    Spouse Name: N/A  . Number of Children: N/A  . Years of Education: N/A   Social History Main Topics  . Smoking status: Former Smoker -- 1.00 packs/day for 44 years    Types: Cigarettes    Quit date: 11/21/2013  . Smokeless tobacco: Not on file     Comment: Stopped 11/21/2013   . Alcohol Use: No     Comment: former  . Drug Use: No  . Sexual Activity: Not on file   Other Topics Concern  . Not on file   Social History Narrative   Review of Systems: Review of Systems  Constitutional: Negative for fever and chills.  HENT: Negative for congestion and sore throat.   Eyes: Negative for blurred vision.  Respiratory:  Negative for cough and shortness of breath.   Cardiovascular: Negative for chest pain and leg swelling.  Gastrointestinal: Negative for nausea, vomiting, abdominal pain and diarrhea.  Musculoskeletal: Negative for myalgias.  Skin: Negative for rash.  Neurological: Negative for dizziness and loss of consciousness.  Psychiatric/Behavioral: Negative for depression.    Objective:  Physical Exam: Filed Vitals:   03/17/15 1523  BP: 136/68  Pulse: 60  Temp: 98.2 F (36.8 C)  TempSrc: Oral  SpO2: 100%   Physical Exam  Constitutional: He is oriented to person, place, and time. He appears well-developed and well-nourished.  HENT:  Head: Normocephalic and atraumatic.  Eyes: EOM are normal.  Cardiovascular: Normal rate and regular rhythm.   Pulmonary/Chest: Effort normal and breath sounds normal.  Abdominal: Soft. There is no tenderness.  Musculoskeletal: Normal range of motion. He exhibits no edema.  Neurological: He is alert and oriented to person, place, and time.  Skin: Skin is warm and dry.  Psychiatric: He has a normal mood and affect.     Assessment & Plan:   Please see problem list for assessment and plan.  Case discussed with Dr. Daryll Drown.  Essential hypertension Assessment: Patients BP today is 136/68, which is better than previous readings.  Given his newly diagnosed diabetes mellitus, goal BP will be closer to <130/90 so we are getting close to this.  Patient states he occasionally checks BP at home but did not record the readings.  Plan: - Continue with current meds: Amlodipine 64m daily, Clonidine 0.1 daily, Lasix 230mdaily, Hydralazine 50 mg BID, Lisinopril 40 mg daily.  In the future, we discussed tapering off one medication and increasing the dose of another in order to decrease pill burden.  I did not want to do this today given that we made some other medication changes, but patient is agreeable - Encouraged patient to keep BP log and bring to next visit -  Encouraged diet (decreased salt) and exercise to help with BP  Type 2 diabetes mellitus with stage 3 chronic kidney disease, without long-term current use of insulin ([place]essment: Patient doing very well since recently being diagnosed with diabetes last month.  He is checking his glucose daily (averaging approx 150), has met with DoButch Pennylyler twice, and continues to be proactive in helping to manage this.  He is tolerating metformin but is only taking 50065mID.  He occasionally will indulge in high sugar foods and notices his blood sugar being higher at those times.  Plan: - increase metformin to 1000m47mD - start repaglinide 0.5mg 74mneeded when eating higher sugar food - foot exam done today - order placed for retinal photographs  Healthcare maintenance Assessment and Plan: - HIV and HCV were negative  - declines referral for screening colonoscopy so will give stool cards today   Hyperlipidemia Assessment: Patient started back on Pravastatin last month due to intolerance of Atorvastatin.  He is doing well on Pravastatin 80m daily.  Plan: - Continue pravastatin at current dose   TOBACCO ABUSE Assessment and Plan: Patient quit smoking in Sept 2015.  Reports today that he continues to abstain from smoking.

## 2015-03-17 NOTE — Assessment & Plan Note (Signed)
Assessment: Patient started back on Pravastatin last month due to intolerance of Atorvastatin.  He is doing well on Pravastatin 40mg  daily.  Plan: - Continue pravastatin at current dose

## 2015-03-17 NOTE — Assessment & Plan Note (Signed)
Assessment and Plan: Patient quit smoking in Sept 2015.  Reports today that he continues to abstain from smoking.

## 2015-03-17 NOTE — Progress Notes (Signed)
  Medical Nutrition Therapy:  Appt start time: 1430 end time:  [id number]. Visit # 2 series of at least 6 likely needed  Last visit 02-18-15  Assessment:  Primary concerns today: glycemic control.  Patient is here with his supportive wife for diabetes meal planning and general training.  He is checking his blood sugar at least one time a day. He had one episode where he took his metformin and did not eat and felt funny, when he ate he felt better.   He has found that when he eats cake his blood sugar is high the next morning and  When he eats 2 cookies his blood sugar is fine.    ANTHROPOMETRICS: weight- 152.9, height-67.5", BMI-23.6 within normal limits WEIGHT HISTORY:150-160# for a long time SLEEP:not discussed today MEDICATIONS: metformin 1000 mg daily,  BLOOD SUGAR: most CBGs on meter download in 100s except later in the day DIETARY INTAKE: 24-hr recall: Not completed today.    Usual physical activity: active on the job, CDE encouraged 30-60 minutes walk after he eats sweets .   Estimated daily energy needs: 2000 calories 225 g carbohydrates/day   Progress Towards Goal(s):  In progress.   Nutritional Diagnosis:  NB-1.1 Food and nutrition-related knowledge deficit As related to lack of diabetes training is improving  As evidenced by their report and questions.    Intervention:  Nutrition education on  How to handle high and low blood sugars, diabetes medicine,  Coordination of care: suggest rx for prandin for PRN use when he eats sweets Teaching Method Utilized: Visual, Auditory, Hands on Handouts given during visit include:avs, carb counting and meal planing booklet Barriers to learning/adherence to lifestyle change: none noted today Diabetes support- wife and he was given a diabetes magazine and suggested a subscription be used for diabetes support Demonstrated degree of understanding via:  Teach Back   Monitoring/Evaluation:  Dietary intake, exercise,meter, and body weight in 6  week(s).

## 2015-03-17 NOTE — Patient Instructions (Signed)
You are doing a wonderful job taking care of your diabetes!  Please schedule a follow up with Dr. Juleen China in early March.  Please make a follow up for 4-8 weeks from now to be seen by Butch Penny.   I have sent a new prescription to your pharmacy for Repaglinide 0.5mg  to take as needed when you have some sweets.  For your metformin, increase the dose to 1,000mg  (full pill) twice per day.   It is okay to fit in a sweet food about 1 time a day or less. (as long as we eat the fruits, veggies and whole grains our body needs, too!)  Could try eating sweets that have fruit in them. Cut up a half of banana and top with 1/2 cup ice cream, or chop up and apple and top with cinnamin, sugar, pecans and a few raisins and microwave for a minute or two.   Call anytime with questions!  Butch Penny [phone]

## 2015-03-17 NOTE — Assessment & Plan Note (Signed)
Assessment and Plan: - HIV and HCV were negative  - declines referral for screening colonoscopy so will give stool cards today

## 2015-03-20 NOTE — Progress Notes (Signed)
Internal Medicine Clinic Attending  Case discussed with Dr. Wallace at the time of the visit.  We reviewed the resident's history and exam and pertinent patient test results.  I agree with the assessment, diagnosis, and plan of care documented in the resident's note.  

## 2015-04-08 ENCOUNTER — Other Ambulatory Visit: Payer: Self-pay | Admitting: *Deleted

## 2015-04-08 DIAGNOSIS — N183 Chronic kidney disease, stage 3 unspecified: Secondary | ICD-10-CM

## 2015-04-08 DIAGNOSIS — E1122 Type 2 diabetes mellitus with diabetic chronic kidney disease: Secondary | ICD-10-CM

## 2015-04-08 MED ORDER — GLUCOSE BLOOD VI STRP: ORAL_STRIP | Status: DC

## 2015-04-08 NOTE — Telephone Encounter (Signed)
Fax from the pharmacy - need dx code. Thanks

## 2015-05-19 ENCOUNTER — Ambulatory Visit (INDEPENDENT_AMBULATORY_CARE_PROVIDER_SITE_OTHER): Payer: BLUE CROSS/BLUE SHIELD | Admitting: Internal Medicine

## 2015-05-19 ENCOUNTER — Encounter: Payer: Self-pay | Admitting: Internal Medicine

## 2015-05-19 ENCOUNTER — Ambulatory Visit (INDEPENDENT_AMBULATORY_CARE_PROVIDER_SITE_OTHER): Payer: BLUE CROSS/BLUE SHIELD | Admitting: Dietician

## 2015-05-19 VITALS — Wt 157.2 lb

## 2015-05-19 VITALS — BP 146/80 | HR 99 | Temp 98.2°F | Wt 157.2 lb

## 2015-05-19 DIAGNOSIS — Z713 Dietary counseling and surveillance: Secondary | ICD-10-CM | POA: Diagnosis not present

## 2015-05-19 DIAGNOSIS — E119 Type 2 diabetes mellitus without complications: Secondary | ICD-10-CM | POA: Diagnosis not present

## 2015-05-19 DIAGNOSIS — N183 Chronic kidney disease, stage 3 unspecified: Secondary | ICD-10-CM

## 2015-05-19 DIAGNOSIS — Z7984 Long term (current) use of oral hypoglycemic drugs: Secondary | ICD-10-CM | POA: Diagnosis not present

## 2015-05-19 DIAGNOSIS — I10 Essential (primary) hypertension: Secondary | ICD-10-CM

## 2015-05-19 DIAGNOSIS — I129 Hypertensive chronic kidney disease with stage 1 through stage 4 chronic kidney disease, or unspecified chronic kidney disease: Secondary | ICD-10-CM

## 2015-05-19 DIAGNOSIS — E1122 Type 2 diabetes mellitus with diabetic chronic kidney disease: Secondary | ICD-10-CM | POA: Diagnosis not present

## 2015-05-19 DIAGNOSIS — Z79899 Other long term (current) drug therapy: Secondary | ICD-10-CM

## 2015-05-19 LAB — POCT GLYCOSYLATED HEMOGLOBIN (HGB A1C): Hemoglobin A1C: 5.6

## 2015-05-19 LAB — HM DIABETES EYE EXAM

## 2015-05-19 LAB — GLUCOSE, CAPILLARY: Glucose-Capillary: 102 mg/dL — ABNORMAL HIGH (ref 65–99)

## 2015-05-19 NOTE — Progress Notes (Signed)
  Medical Nutrition Therapy:  Appt start time: 1430 end time:  [id number]. Visit # 3 series of at least 6 likely needed  Last visit 03-17-2015  Assessment:  Primary concerns today: glycemic control.  Patient is here for follow up. His meter shows readings 90-130with his supportive wife for diabetes meal planning and general training.  He is checking his blood sugar at least one time a day. He had one episode where he took his metformin and did not eat and felt funny, when he ate he felt better.   He has found that when he eats cake his blood sugar is high the next morning and  When he eats 2 cookies his blood sugar is fine.    ANTHROPOMETRICS: weight- 152.9, height-67.5", BMI-23.6 within normal limits WEIGHT HISTORY:150-160# for a long time SLEEP:not discussed today MEDICATIONS: metformin 1000 mg daily,  BLOOD SUGAR: most CBGs on meter download in 100s except later in the day DIETARY INTAKE: 24-hr recall: Not completed today.    Usual physical activity: active on the job, CDE encouraged 30-60 minutes walk after he eats sweets .   Estimated daily energy needs: 2000 calories 225 g carbohydrates/day   Progress Towards Goal(s):  In progress.   Nutritional Diagnosis:  NB-1.1 Food and nutrition-related knowledge deficit As related to lack of diabetes training is improving  As evidenced by their report and questions.    Intervention:  Nutrition education on  How to handle high and low blood sugars, diabetes medicine,  Coordination of care: suggest rx for prandin for PRN use when he eats sweets Teaching Method Utilized: Visual, Auditory, Hands on Handouts given during visit include:avs, carb counting and meal planing booklet Barriers to learning/adherence to lifestyle change: none noted today Diabetes support- wife and he was given a diabetes magazine and suggested a subscription be used for diabetes support Demonstrated degree of understanding via:  Teach Back   Monitoring/Evaluation:  Dietary  intake, exercise,meter, and body weight in 6 week(s).

## 2015-05-19 NOTE — Patient Instructions (Signed)
Good Job on lowering your A1C to 5.6!!!!  Please make a follow up appointment for 3 months  Please make an appointment with a dentist   Try to eat more... 1. Whole grains: whole wheat cereal, crackers, bread, pasta, old fashioned oats & brown rice 2. Whole fruits-1 cup a day 3. Vegetables- 2-3 cups a day 4. Lowfat Protein: Chicken, [place], lean cuts of beef and pork, fish 2-3 times a week 5. Nuts, Seeds and Soy:add walnuts to cereal, peanut butter sandwich  Eat Less... 1. Saturated & Transfats- mostly from red meat, high fat dairy like milk, ice cream, coffee creamer, snack foods like chips, pork rind, fried foods 2. Added Sugar: artifical sweeteners can help 3. Sodium:Use spices instead of salt, avoid salty foods like soup, crackers, chips, lunch meat, sausage, hot dogs

## 2015-05-20 NOTE — Assessment & Plan Note (Signed)
Assessment: Patients BP today is 146/80, which is slightly above his goal. Given his newly diagnosed diabetes mellitus, goal BP will be closer to <130/90. Patient states he occasionally checks BP at home but did not record the readings.  He tells me his home readings are usually around 130/70.  Plan: - Continue with current meds: Amlodipine 10mg  daily, Clonidine 0.1 daily, Lasix 20mg  daily, Hydralazine 50 mg BID, Lisinopril 40 mg daily. We have discussed tapering off one medication and increasing the dose of another in order to decrease pill burden or possibly adding clonidine patch. No changes were made today as patient had been here for long time (had appt with Diabetes Educator as well) and was getting eye exam done, too and he did not have time to really discuss this further - Encouraged patient to keep BP log and bring to next visit - Encouraged diet (decreased salt) and exercise to help with BP - Will talk about this at next f/u in 3 months.

## 2015-05-20 NOTE — Assessment & Plan Note (Signed)
Assessment: Patient doing very well after diagnosis of diabetes in December 2016 with A1c > 11.  We increased his metformin to 1000mg  BID and added repaglinide as needed.  He is continuing to meet with Debera Lat for DM education and has very supportive wife.  He checks his blood sugars once per day.  Plan: - retinal photographs done today. - A1c today is 5.6 - on ACE inhibitor so did not check microalbumin/creatine - foot exam done last visit - f/u in 3 months to make sure still doing well, address BP as needed as was slightly above goal today

## 2015-05-20 NOTE — Patient Instructions (Signed)
William Perkins,  It was very nice meeting with you yesterday.   We talked about eating a few different foods to help improve your nutrition and health.   For example:    Egg Mc Muffin  For breakfast  Trying tuna salad for lunch with mayonnaise  Using bread instead of crackers more often (crackers often have more salt & fat)   If you cannot find a whole wheat bread you like, rye, sourdough or pumpernickel; are okay,  too.   Here are some general tips: Try to eat more... 1. Whole grains: whole wheat cereal, crackers, bread, pasta, old fashioned oats & brown rice 2. Whole fruits-1 cup a day 3. Vegetables- 2-3 cups a day 4. Lowfat Protein: Chicken, [place], lean cuts of beef and pork, fish 2-3 times a week 5. Nuts, Seeds and Soy:add walnuts to cereal, peanut butter sandwich  Eat Less... 1. Saturated & Transfats- mostly from red meat, high fat dairy like milk, ice cream, coffee creamer, snack foods like chips, pork rind, fried foods 2. Added Sugar: artifical sweeteners can help 3. Sodium:Use spices instead of salt, avoid salty foods like soup, crackers, chips, lunch meat, sausage, hot dogs  Suggest we follow up in about 3 months.

## 2015-05-20 NOTE — Progress Notes (Signed)
Patient ID: Robson Henschen, male   DOB: 11-Mar-1950, 65 y.o.   MRN: [id number]   Subjective:   Patient ID: Jerardo Markwood male   DOB: October 18, 1950 65 y.o.   MRN: [id number]  HPI: Mr.Akili Campbel is a 65 y.o. man with past medical history as detailed below presenting for follow up of diabetes.  Please see A&P for status of medical conditions addressed at today's visit.    Past Medical History  Diagnosis Date  . Hypertension   . Left ventricular hypertrophy   . Renal insufficiency   . Leg pain, left 11/15/2009  . Hand numbness 10/07/2009  . Soft tissue disorder 08/2008  . Hypertension 03/22/2006  . Hyperlipidemia 03/22/2008  . Chronic renal insufficiency 03/21/2006    CrCl <50 ml  . Left ventricular hypertrophy 03/22/2006  . Tobacco abuse 03/22/2006    2 ppd x 30 years  . GERD (gastroesophageal reflux disease) 03/22/2006  . Lumbar strain 07/15/2008  . DDD (degenerative disc disease), cervical    Current Outpatient Prescriptions  Medication Sig Dispense Refill  . acetaminophen (TYLENOL) 325 MG tablet Take 1 tablet (325 mg total) by mouth every 6 (six) hours as needed. 120 tablet 0  . amLODipine (NORVASC) 10 MG tablet Take 1 tablet (10 mg total) by mouth daily. 90 tablet 0  . cloNIDine (CATAPRES) 0.1 MG tablet Take 1 tablet (0.1 mg total) by mouth 2 (two) times daily. 180 tablet 0  . cyclobenzaprine (FLEXERIL) 5 MG tablet Take 1 tablet (5 mg total) by mouth at bedtime as needed for muscle spasms. (Patient not taking: Reported on 06/25/2014) 30 tablet 0  . diclofenac sodium (VOLTAREN) 1 % GEL Apply 2 g topically 4 (four) times daily as needed. (Patient not taking: Reported on 06/25/2014) 100 g 0  . furosemide (LASIX) 20 MG tablet Take 1 tablet (20 mg total) by mouth daily. 90 tablet 0  . glucose blood (FREESTYLE LITE) test strip Use to check blood sugar one time a day. Dx code:E11.22. 50 each 7  . hydrALAZINE (APRESOLINE) 50 MG tablet Take 1 tablet (50 mg total) by mouth 2 (two) times daily.  180 tablet 0  . LANCETS MICRO THIN 33G MISC Use to check blood sugar 1 time a day 100 each 3  . lisinopril (PRINIVIL,ZESTRIL) 40 MG tablet Take 1 tablet (40 mg total) by mouth daily. 90 tablet 0  . metFORMIN (GLUCOPHAGE) 1000 MG tablet Take 1 tablet (1,000 mg total) by mouth 2 (two) times daily with a meal. 60 tablet 11  . omeprazole (PRILOSEC) 40 MG capsule Take 1 capsule (40 mg total) by mouth daily. 90 capsule 0  . pravastatin (PRAVACHOL) 40 MG tablet Take 1 tablet (40 mg total) by mouth every evening. 30 tablet 11  . repaglinide (PRANDIN) 0.5 MG tablet Take 1 tablet (0.5 mg total) by mouth as needed. 30 tablet 5  . varenicline (CHANTIX) 0.5 MG tablet Take 1 tablet (0.5 mg total) by mouth 2 (two) times daily. 0.5 once a day for 3 days, then 0.5 mg bid for 4 days, then 1 mg bid x 11 weeks. (Patient not taking: Reported on 01/20/2015) 160 tablet 1  . [DISCONTINUED] lisinopril-hydrochlorothiazide (PRINZIDE,ZESTORETIC) 20-12.5 MG per tablet Take 2 tablets by mouth daily.     No current facility-administered medications for this visit.   Family History  Problem Relation Age of Onset  . Diabetes Mother   . Diabetes Sister   . Diabetes Brother   . Heart disease Brother    Social History  Social History  . Marital Status: Married    Spouse Name: N/A  . Number of Children: N/A  . Years of Education: 11   Social History Main Topics  . Smoking status: Former Smoker -- 1.00 packs/day for 44 years    Types: Cigarettes    Quit date: 11/21/2013  . Smokeless tobacco: None     Comment: Stopped 11/21/2013   . Alcohol Use: No     Comment: former  . Drug Use: No  . Sexual Activity: Not Asked   Other Topics Concern  . None   Social History Narrative   Review of Systems: Review of Systems  Constitutional: Negative for fever and chills.  Eyes: Negative for blurred vision.  Respiratory: Negative for cough.   Cardiovascular: Negative for chest pain.  Gastrointestinal: Negative for nausea,  vomiting, abdominal pain and diarrhea.  Genitourinary: Negative for frequency.  Endo/Heme/Allergies: Negative for polydipsia.     Objective:  Physical Exam: Filed Vitals:   05/19/15 1614  BP: 146/80  Pulse: 99  Temp: 98.2 F (36.8 C)  TempSrc: Oral  Weight: 157 lb 3.2 oz (71.305 kg)  SpO2: 99%   Physical Exam  Constitutional: He is oriented to person, place, and time and well-developed, well-nourished, and in no distress.  HENT:  Head: Normocephalic and atraumatic.  Poor dentition  Eyes: EOM are normal.  Neck: Normal range of motion.  Pulmonary/Chest: Effort normal.  Neurological: He is alert and oriented to person, place, and time.  Skin: Skin is warm and dry.   Assessment & Plan:   Please see problem list for assessment and plan.  Case discussed with Dr. Daryll Drown.

## 2015-05-20 NOTE — Progress Notes (Signed)
  Medical Nutrition Therapy:  Appt start time: 1440 end time:  [id number]. Visit # 3 series of at least 6 likely needed  Last visit 03-17-15  Assessment:  Primary concerns today: glycemic control.  Patient is here as a follow up to 2 previous visits where he learned about diabetes and diabetes meal planning.  He is checking his blood sugar at least one time a day.   He has found that when he eats cake his blood sugar is high the next morning and  When he eats 2 cookies his blood sugar is fine.   ANTHROPOMETRICS: weight- 157#, height-67.5", BMI within normal limits SLEEP:"good" per patient, ~ 7-8 hours a night MEDICATIONS: metformin 500 mg daily, has a low this am after taking 1000 mg last night/yesterday, takes prandin  A few timesweek BLOOD SUGAR: most CBGs on meter download in 100s, 7 day and 14 day average 95, 30 days average 100, a1c 5.6% today DIETARY INTAKE: 24-hr recall:  Breakfast: sausage & egg biscuit, coffee withy cream and sweetener Lunch- a can of vienna sausage on crackers. (stopped eating bread and doesn't like wheat bread) , diet Dr Malachi Bonds or Coke Dinner; Beef or fried chicken, green or green beans, potato salad or beans.  Snack- ice cream or moon pies  Usual physical activity: active on the job, CDE encouraged 30-60 minutes walk or other activity on days off, he says when the spring comes he will mow his lawn  Estimated daily energy needs: 2000 calories 225 g carbohydrates/day   Progress Towards Goal(s):  In progress.   Nutritional Diagnosis:  NB-1.1 Food and nutrition-related knowledge deficit As related to lack of diabetes training is improving, but still needs reveiw  As evidenced by his report of fried foods and food choices high in saturated fat.    Intervention:  Nutrition education on  healthier food choices- he agreed to try tuna for lunch, and egg McMuffin for breakfast, Foot exam was ok.   Teaching Method Utilized: Visual, Auditory, Hands on Handouts given during  visit include:avs Barriers to learning/adherence to lifestyle change: none noted today Diabetes support- wife and he was given a diabetes magazine and suggested a subscription be used for diabetes support Demonstrated degree of understanding via:  Teach Back   Monitoring/Evaluation:  Dietary intake, exercise,meter, and body weight in 6 week(s).

## 2015-05-26 NOTE — Progress Notes (Signed)
Internal Medicine Clinic Attending  Case discussed with Dr. Wallace at the time of the visit.  We reviewed the resident's history and exam and pertinent patient test results.  I agree with the assessment, diagnosis, and plan of care documented in the resident's note.  

## 2015-06-09 ENCOUNTER — Encounter: Payer: Self-pay | Admitting: Dietician

## 2015-06-17 ENCOUNTER — Other Ambulatory Visit: Payer: Self-pay | Admitting: Internal Medicine

## 2015-08-23 ENCOUNTER — Other Ambulatory Visit: Payer: Self-pay | Admitting: Internal Medicine

## 2015-08-24 ENCOUNTER — Other Ambulatory Visit: Payer: Self-pay | Admitting: Internal Medicine

## 2015-08-24 DIAGNOSIS — I10 Essential (primary) hypertension: Secondary | ICD-10-CM

## 2015-08-24 MED ORDER — CLONIDINE HCL 0.1 MG PO TABS: 0.1000 mg | ORAL_TABLET | Freq: Two times a day (BID) | ORAL | Status: DC

## 2015-08-24 MED ORDER — HYDRALAZINE HCL 50 MG PO TABS: 50.0000 mg | ORAL_TABLET | Freq: Two times a day (BID) | ORAL | Status: DC

## 2015-10-27 ENCOUNTER — Ambulatory Visit (INDEPENDENT_AMBULATORY_CARE_PROVIDER_SITE_OTHER): Payer: BLUE CROSS/BLUE SHIELD | Admitting: Internal Medicine

## 2015-10-27 ENCOUNTER — Encounter: Payer: Self-pay | Admitting: Internal Medicine

## 2015-10-27 VITALS — BP 197/85 | HR 55 | Temp 98.1°F | Ht 71.0 in | Wt 156.0 lb

## 2015-10-27 DIAGNOSIS — E1122 Type 2 diabetes mellitus with diabetic chronic kidney disease: Secondary | ICD-10-CM

## 2015-10-27 DIAGNOSIS — N183 Chronic kidney disease, stage 3 unspecified: Secondary | ICD-10-CM

## 2015-10-27 DIAGNOSIS — I129 Hypertensive chronic kidney disease with stage 1 through stage 4 chronic kidney disease, or unspecified chronic kidney disease: Secondary | ICD-10-CM

## 2015-10-27 DIAGNOSIS — Z0000 Encounter for general adult medical examination without abnormal findings: Secondary | ICD-10-CM

## 2015-10-27 DIAGNOSIS — I10 Essential (primary) hypertension: Secondary | ICD-10-CM

## 2015-10-27 DIAGNOSIS — E785 Hyperlipidemia, unspecified: Secondary | ICD-10-CM

## 2015-10-27 LAB — POCT GLYCOSYLATED HEMOGLOBIN (HGB A1C): Hemoglobin A1C: 5.6

## 2015-10-27 LAB — GLUCOSE, CAPILLARY: Glucose-Capillary: 88 mg/dL (ref 65–99)

## 2015-10-27 NOTE — Progress Notes (Signed)
CC: here for HTN and DM follow up  HPI:  [name] is a 65 y.o. man with a past medical history listed below here today for follow up of his HTN and DM.   For details of today's visit and the status of his chronic medical issues please refer to the assessment and plan.   Past Medical History:  Diagnosis Date  . Chronic renal insufficiency 03/21/2006   CrCl <50 ml  . DDD (degenerative disc disease), cervical   . GERD (gastroesophageal reflux disease) 03/22/2006  . Hand numbness 10/07/2009  . Hyperlipidemia 03/22/2008  . Hypertension   . Hypertension 03/22/2006  . Left ventricular hypertrophy   . Left ventricular hypertrophy 03/22/2006  . Leg pain, left 11/15/2009  . Lumbar strain 07/15/2008  . Renal insufficiency   . Soft tissue disorder 08/2008  . Tobacco abuse 03/22/2006   2 ppd x 30 years    Review of Systems:   Review of Systems  Constitutional: Negative for chills and fever.  Eyes: Negative for blurred vision.  Respiratory: Negative for shortness of breath.   Cardiovascular: Negative for chest pain.  Neurological: Negative for headaches.     Physical Exam:  Vitals:   10/27/15 1537  BP: (!) 197/85  Pulse: (!) 55  Temp: 98.1 F (36.7 C)  TempSrc: Oral  SpO2: 99%  Weight: 156 lb (70.8 kg)  Height: 5\' 11"  (1.803 m)   Physical Exam  Constitutional: He is oriented to person, place, and time and well-developed, well-nourished, and in no distress.  HENT:  Head: Normocephalic and atraumatic.  Eyes: EOM are normal.  Cardiovascular: Normal rate and regular rhythm.   Pulmonary/Chest: Effort normal and breath sounds normal.  Neurological: He is alert and oriented to person, place, and time.  Psychiatric: Mood and affect normal.    Assessment & Plan:   See Encounters Tab for problem based charting.  Patient discussed with Dr. Evette Doffing   Essential hypertension BP Readings from Last 3 Encounters:  10/27/15 (!) 197/85  05/19/15 (!) 146/80  03/17/15  136/68   A: His BP today is very much not at goal.  He tells me that he has been without his Lasix, Lisinopril, and Amlodipine for 2-3 weeks.  He has been taking his hydralazine and clonidine during this time.  He states he has checked his BP at home during this time and has found his systolic BP to be in the [id number] at home so he is unsure why it might be so high today.  P: - refilled all his medications and asked that if he is to run out of meds before his next appointments to call and we can send in a refill - continue current regimen - RTC in 4 weeks to recheck his BP  Type 2 diabetes mellitus with stage 3 chronic kidney disease, without long-term current use of insulin ([place]) Lab Results  Component Value Date   HGBA1C 5.6 10/27/2015   A: Patient doing well on metformin 1000mg  BID plus repaglinide as needed.  He states he seldomly uses the repaglinide.  A1c today is 5.6.  P: - continue metformin at current dose - discontinue repaglinide with his well-controlled A1c and infrequent use. - A1c in 3 months - foot exam due in January - eye exam due in March.  No DM retinopathy last exam.   Chronic kidney disease A: Stable CKD III likely due to HTN.  Has followed with Dr. Posey Pronto with Nephrology in the past with last visit being March  2014.  P: - I asked patient to schedule a follow up appointment with Dr. Posey Pronto and told him, if needed, we could put in a new referral for him.   Hyperlipidemia A: Doing well on pravastatin.  P: - refilled pravastatin  Healthcare maintenance A/P: - patient declined pneumonia vaccine today.

## 2015-10-27 NOTE — Patient Instructions (Signed)
Thank you for coming to see me today. It was a pleasure. Today we talked about:   Blood pressure: I have refilled all your medications today.  Please continue to take as directed.  Diabetes: you are doing a great job managing your diabetes.  Kidney disease: please contact Dr. Serita Grit office to schedule a visit.  He is with [facility] at [phone].  Tell them you are a patient of Dr. Posey Pronto.  Please follow-up with me in 4 weeks to check on your blood pressure.  If you have any questions or concerns, please do not hesitate to call the office at [phone].  Take Care,   Jule Ser, DO

## 2015-10-28 LAB — BMP8+ANION GAP
Anion Gap: 16 mmol/L (ref 10.0–18.0)
BUN/Creatinine Ratio: 11 (ref 10–24)
BUN: 19 mg/dL (ref 8–27)
CO2: 21 mmol/L (ref 18–29)
Calcium: 9.5 mg/dL (ref 8.6–10.2)
Chloride: 101 mmol/L (ref 96–106)
Creatinine, Ser: 1.76 mg/dL — ABNORMAL HIGH (ref 0.76–1.27)
GFR calc Af Amer: 46 mL/min/{1.73_m2} — ABNORMAL LOW (ref >59)
GFR calc non Af Amer: 40 mL/min/{1.73_m2} — ABNORMAL LOW (ref >59)
Glucose: 87 mg/dL (ref 65–99)
Potassium: 4 mmol/L (ref 3.5–5.2)
Sodium: 138 mmol/L (ref 134–144)

## 2015-10-28 MED ORDER — HYDRALAZINE HCL 50 MG PO TABS: 50.0000 mg | ORAL_TABLET | Freq: Two times a day (BID) | ORAL | 11 refills | Status: DC

## 2015-10-28 MED ORDER — FUROSEMIDE 20 MG PO TABS: 20.0000 mg | ORAL_TABLET | Freq: Every day | ORAL | 11 refills | Status: DC

## 2015-10-28 MED ORDER — LISINOPRIL 40 MG PO TABS: 40.0000 mg | ORAL_TABLET | Freq: Every day | ORAL | 11 refills | Status: DC

## 2015-10-28 MED ORDER — CLONIDINE HCL 0.1 MG PO TABS: 0.1000 mg | ORAL_TABLET | Freq: Two times a day (BID) | ORAL | 11 refills | Status: DC

## 2015-10-28 MED ORDER — METFORMIN HCL 1000 MG PO TABS: 1000.0000 mg | ORAL_TABLET | Freq: Two times a day (BID) | ORAL | 11 refills | Status: DC

## 2015-10-28 MED ORDER — AMLODIPINE BESYLATE 10 MG PO TABS: 10.0000 mg | ORAL_TABLET | Freq: Every day | ORAL | 11 refills | Status: DC

## 2015-10-28 MED ORDER — OMEPRAZOLE 40 MG PO CPDR: 40.0000 mg | DELAYED_RELEASE_CAPSULE | Freq: Every day | ORAL | 11 refills | Status: DC

## 2015-10-28 MED ORDER — PRAVASTATIN SODIUM 40 MG PO TABS: 40.0000 mg | ORAL_TABLET | Freq: Every evening | ORAL | 11 refills | Status: DC

## 2015-10-28 MED ORDER — GLUCOSE BLOOD VI STRP: ORAL_STRIP | 7 refills | Status: DC

## 2015-10-28 NOTE — Assessment & Plan Note (Signed)
BP Readings from Last 3 Encounters:  10/27/15 (!) 197/85  05/19/15 (!) 146/80  03/17/15 136/68   A: His BP today is very much not at goal.  He tells me that he has been without his Lasix, Lisinopril, and Amlodipine for 2-3 weeks.  He has been taking his hydralazine and clonidine during this time.  He states he has checked his BP at home during this time and has found his systolic BP to be in the [id number] at home so he is unsure why it might be so high today.  P: - refilled all his medications and asked that if he is to run out of meds before his next appointments to call and we can send in a refill - continue current regimen - RTC in 4 weeks to recheck his BP

## 2015-10-28 NOTE — Assessment & Plan Note (Signed)
A/P: - patient declined pneumonia vaccine today.

## 2015-10-28 NOTE — Assessment & Plan Note (Signed)
A: Stable CKD III likely due to HTN.  Has followed with Dr. Posey Pronto with Nephrology in the past with last visit being March 2014.  P: - I asked patient to schedule a follow up appointment with Dr. Posey Pronto and told him, if needed, we could put in a new referral for him.

## 2015-10-28 NOTE — Assessment & Plan Note (Signed)
A: Doing well on pravastatin.  P: - refilled pravastatin

## 2015-10-28 NOTE — Assessment & Plan Note (Signed)
Lab Results  Component Value Date   HGBA1C 5.6 10/27/2015   A: Patient doing well on metformin 1000mg  BID plus repaglinide as needed.  He states he seldomly uses the repaglinide.  A1c today is 5.6.  P: - continue metformin at current dose - discontinue repaglinide with his well-controlled A1c and infrequent use. - A1c in 3 months - foot exam due in January - eye exam due in March.  No DM retinopathy last exam.

## 2015-10-31 NOTE — Progress Notes (Signed)
Internal Medicine Clinic Attending  Case discussed with Dr. Wallace at the time of the visit.  We reviewed the resident's history and exam and pertinent patient test results.  I agree with the assessment, diagnosis, and plan of care documented in the resident's note.  

## 2016-03-01 ENCOUNTER — Ambulatory Visit (INDEPENDENT_AMBULATORY_CARE_PROVIDER_SITE_OTHER): Payer: BLUE CROSS/BLUE SHIELD | Admitting: Internal Medicine

## 2016-03-01 ENCOUNTER — Encounter (INDEPENDENT_AMBULATORY_CARE_PROVIDER_SITE_OTHER): Payer: Self-pay

## 2016-03-01 ENCOUNTER — Encounter: Payer: Self-pay | Admitting: Internal Medicine

## 2016-03-01 VITALS — BP 160/81 | HR 62 | Temp 97.5°F | Ht 71.0 in | Wt 155.8 lb

## 2016-03-01 DIAGNOSIS — K5900 Constipation, unspecified: Secondary | ICD-10-CM | POA: Diagnosis not present

## 2016-03-01 DIAGNOSIS — I10 Essential (primary) hypertension: Secondary | ICD-10-CM

## 2016-03-01 DIAGNOSIS — E1122 Type 2 diabetes mellitus with diabetic chronic kidney disease: Secondary | ICD-10-CM

## 2016-03-01 DIAGNOSIS — N183 Chronic kidney disease, stage 3 unspecified: Secondary | ICD-10-CM

## 2016-03-01 DIAGNOSIS — I129 Hypertensive chronic kidney disease with stage 1 through stage 4 chronic kidney disease, or unspecified chronic kidney disease: Secondary | ICD-10-CM | POA: Diagnosis not present

## 2016-03-01 DIAGNOSIS — Z79899 Other long term (current) drug therapy: Secondary | ICD-10-CM

## 2016-03-01 DIAGNOSIS — K5901 Slow transit constipation: Secondary | ICD-10-CM

## 2016-03-01 DIAGNOSIS — Z87891 Personal history of nicotine dependence: Secondary | ICD-10-CM

## 2016-03-01 LAB — POCT GLYCOSYLATED HEMOGLOBIN (HGB A1C): Hemoglobin A1C: 5.4

## 2016-03-01 LAB — GLUCOSE, CAPILLARY: Glucose-Capillary: 104 mg/dL — ABNORMAL HIGH (ref 65–99)

## 2016-03-01 MED ORDER — DOCUSATE SODIUM 100 MG PO TABS: 100.0000 mg | ORAL_TABLET | Freq: Two times a day (BID) | ORAL | 2 refills | Status: DC | PRN

## 2016-03-01 MED ORDER — GLUCOSE BLOOD VI STRP: ORAL_STRIP | 7 refills | Status: DC

## 2016-03-01 MED ORDER — FUROSEMIDE 20 MG PO TABS: 20.0000 mg | ORAL_TABLET | Freq: Every day | ORAL | 3 refills | Status: DC

## 2016-03-01 NOTE — Patient Instructions (Signed)
For your constipation: -Please complete the stool cards -Take docusate twice a day as needed for constipation -Increase your fiber intake including things like benefiber or metamucil  Remember to take all of your medications as prescribed. Follow up in one month for a blood pressure recheck.   Constipation, Adult Constipation is when a person:  Poops (has a bowel movement) fewer times in a week than normal.  Has a hard time pooping.  Has poop that is dry, hard, or bigger than normal. Follow these instructions at home: Eating and drinking  Eat foods that have a lot of fiber, such as:  Fresh fruits and vegetables.  Whole grains.  Beans.  Eat less of foods that are high in fat, low in fiber, or overly processed, such as:  [place] fries.  Hamburgers.  Cookies.  Candy.  Soda.  Drink enough fluid to keep your pee (urine) clear or pale yellow. General instructions  Exercise regularly or as told by your doctor.  Go to the restroom when you feel like you need to poop. Do not hold it in.  Take over-the-counter and prescription medicines only as told by your doctor. These include any fiber supplements.  Do pelvic floor retraining exercises, such as:  Doing deep breathing while relaxing your lower belly (abdomen).  Relaxing your pelvic floor while pooping.  Watch your condition for any changes.  Keep all follow-up visits as told by your doctor. This is important. Contact a doctor if:  You have pain that gets worse.  You have a fever.  You have not pooped for 4 days.  You throw up (vomit).  You are not hungry.  You lose weight.  You are bleeding from the anus.  You have thin, pencil-like poop (stool). Get help right away if:  You have a fever, and your symptoms suddenly get worse.  You leak poop or have blood in your poop.  Your belly feels hard or bigger than normal (is bloated).  You have very bad belly pain.  You feel dizzy or you faint. This  information is not intended to replace advice given to you by your health care provider. Make sure you discuss any questions you have with your health care provider. Document Released: 08/08/2007 Document Revised: 09/09/2015 Document Reviewed: 08/10/2015 Elsevier Interactive Patient Education  2017 [employer].

## 2016-03-01 NOTE — Progress Notes (Signed)
    CC: constipation  HPI: William Perkins is a 65 y.o. male with PMHx of HTN, GERD, T2DM, and Emphysema who presents to the clinic for complaint of constipation.  Patient states that he has had trouble with constipation since he was a child. He normally has a bowel movement about twice a week. However, he was feeling poorly yesterday and bloated as he had not had a bowel movement in over one week. He denies diarrhea, abdominal pain, nausea, vomiting, blood in his stool or unexpected weight loss. He took something over the counter from the pharmacy yesterday which resulted in a large bowel movement. He currently feels much better and denies any abdominal pain. There was no blood in his stool. He does not take medication for constipation regularly. He does not try to eat a diet high in fiber. Of note, he has never completed a colon cancer screening with colonoscopy or with stool cards.   Past Medical History:  Diagnosis Date  . Chronic renal insufficiency 03/21/2006   CrCl <50 ml  . DDD (degenerative disc disease), cervical   . GERD (gastroesophageal reflux disease) 03/22/2006  . Hand numbness 10/07/2009  . Hyperlipidemia 03/22/2008  . Hypertension   . Hypertension 03/22/2006  . Left ventricular hypertrophy   . Left ventricular hypertrophy 03/22/2006  . Leg pain, left 11/15/2009  . Lumbar strain 07/15/2008  . Renal insufficiency   . Soft tissue disorder 08/2008  . Tobacco abuse 03/22/2006   2 ppd x 30 years    Review of Systems: Please see pertinent ROS reviewed in HPI and problem based charting.   Physical Exam: Vitals:   03/01/16 0910  BP: (!) 160/80  Pulse: 62  Temp: 97.5 F (36.4 C)  TempSrc: Oral  SpO2: 100%  Weight: 155 lb 12.8 oz (70.7 kg)  Height: 5\' 11"  (1.803 m)   General: Vital signs reviewed.  Patient is well-developed and well-nourished, in no acute distress and cooperative with exam.  Cardiovascular: RRR, S1 normal, S2 normal, no murmurs, gallops, or  rubs. Pulmonary/Chest: Clear to auscultation bilaterally, no wheezes, rales, or rhonchi. Abdominal: Soft, non-tender, non-distended, BS +, no masses, organomegaly, or guarding present.  Skin: Warm, dry and intact.  Psychiatric: Normal mood and affect. speech and behavior is normal. Cognition and memory are normal.   Assessment & Plan:  See encounters tab for problem based medical decision making. Patient discussed with Dr. Lynnae January

## 2016-03-01 NOTE — Assessment & Plan Note (Addendum)
Patient is supposed to be on metformin 1000mg  BID; however, he only takes the metformin as needed.  A1c in August 2017 was 5.6.  P: -Check A1c -May need to go back to Metformin QD versus trial off of metformin if A1c controlled and only taking as prn  Addendum: A1c 5.4 today- will recommend a trial off of metformin and repeat A1c in 3 months

## 2016-03-01 NOTE — Assessment & Plan Note (Addendum)
Patient states that he has had trouble with constipation since he was a child. He normally has a bowel movement about twice a week. However, he was feeling poorly yesterday and bloated as he had not had a bowel movement in over one week. He denies diarrhea, abdominal pain, nausea, vomiting, blood in his stool or unexpected weight loss. He took something over the counter from the pharmacy yesterday which resulted in a large bowel movement. He currently feels much better and denies any abdominal pain. There was no blood in his stool. He does not take medication for constipation regularly. He does not try to eat a diet high in fiber. Of note, he has never completed a colon cancer screening with colonoscopy or with stool cards.   Plan: -Increase dietary fiber and consider addition of Metamucil or Benefiber -Ducosate 100 mg BID prn constipation -Complete FOBT cards -Provided information on constipation and dietary changes

## 2016-03-01 NOTE — Assessment & Plan Note (Addendum)
BP today is 160/80 and 160/81 on repeat. Patient is supposed to be on lisinopril 40 mg QD, hydralazine 50 mg BID, clonidine 0.1 mg BID, and amlodipine 10 mg QD; however, he states he has been out of amlodipine 10 mg QD for one month. He just refilled it yesterday.   Plan: -Start taking amlodipine 10 mg QD -Continue lisinopril 40 mg QD -Continue hydralazine 50 mg BID -Continue clonidine 0.1 mg BID -Return in one month for blood pressure recheck -If still elevated at that time, consider increasing hydralazine and tapering off clonidine if able due to frequent non-compliance

## 2016-03-06 NOTE — Progress Notes (Signed)
Internal Medicine Clinic Attending  Case discussed with Dr. Burns at the time of the visit.  We reviewed the resident's history and exam and pertinent patient test results.  I agree with the assessment, diagnosis, and plan of care documented in the resident's note.  

## 2016-04-04 ENCOUNTER — Telehealth: Payer: Self-pay | Admitting: Internal Medicine

## 2016-04-04 NOTE — Telephone Encounter (Signed)
APT. REMINDER CALL, LMTCB °

## 2016-04-05 ENCOUNTER — Encounter: Payer: Self-pay | Admitting: Internal Medicine

## 2016-04-05 ENCOUNTER — Encounter: Payer: BLUE CROSS/BLUE SHIELD | Admitting: Internal Medicine

## 2016-07-02 ENCOUNTER — Ambulatory Visit (HOSPITAL_COMMUNITY)
Admission: RE | Admit: 2016-07-02 | Discharge: 2016-07-02 | Disposition: A | Discharge status: Home / Self Care | Payer: BLUE CROSS/BLUE SHIELD | Source: Ambulatory Visit | Attending: Internal Medicine | Admitting: Internal Medicine

## 2016-07-02 ENCOUNTER — Ambulatory Visit (INDEPENDENT_AMBULATORY_CARE_PROVIDER_SITE_OTHER): Payer: BLUE CROSS/BLUE SHIELD | Admitting: Internal Medicine

## 2016-07-02 ENCOUNTER — Encounter (INDEPENDENT_AMBULATORY_CARE_PROVIDER_SITE_OTHER): Payer: Self-pay

## 2016-07-02 ENCOUNTER — Telehealth: Payer: Self-pay | Admitting: Internal Medicine

## 2016-07-02 ENCOUNTER — Encounter: Payer: Self-pay | Admitting: Internal Medicine

## 2016-07-02 VITALS — BP 132/70 | HR 63 | Temp 98.0°F | Ht 71.0 in | Wt 154.3 lb

## 2016-07-02 DIAGNOSIS — M5090 Cervical disc disorder, unspecified, unspecified cervical region: Secondary | ICD-10-CM

## 2016-07-02 DIAGNOSIS — K5090 Crohn's disease, unspecified, without complications: Secondary | ICD-10-CM | POA: Diagnosis not present

## 2016-07-02 DIAGNOSIS — M47812 Spondylosis without myelopathy or radiculopathy, cervical region: Secondary | ICD-10-CM | POA: Diagnosis not present

## 2016-07-02 DIAGNOSIS — M79602 Pain in left arm: Secondary | ICD-10-CM | POA: Diagnosis not present

## 2016-07-02 DIAGNOSIS — M5382 Other specified dorsopathies, cervical region: Secondary | ICD-10-CM | POA: Diagnosis not present

## 2016-07-02 DIAGNOSIS — M542 Cervicalgia: Secondary | ICD-10-CM | POA: Diagnosis not present

## 2016-07-02 DIAGNOSIS — I6523 Occlusion and stenosis of bilateral carotid arteries: Secondary | ICD-10-CM | POA: Diagnosis not present

## 2016-07-02 DIAGNOSIS — S199XXA Unspecified injury of neck, initial encounter: Secondary | ICD-10-CM | POA: Diagnosis not present

## 2016-07-02 DIAGNOSIS — N189 Chronic kidney disease, unspecified: Secondary | ICD-10-CM | POA: Diagnosis not present

## 2016-07-02 MED ORDER — GABAPENTIN 300 MG PO CAPS: 300.0000 mg | ORAL_CAPSULE | Freq: Two times a day (BID) | ORAL | 0 refills | Status: DC

## 2016-07-02 NOTE — Patient Instructions (Signed)
Thank you for coming to see me today. It was a pleasure. Today we talked about:   Arm and Neck pain: - I have ordered x-rays of your neck and will update you on the results - I have ordered gabapentin 300mg  twice per day - continue using heat and tylenol (do not exceed 3000mg  per day) - add ice to your therapy  Please follow-up with [place] in 2 weeks to see how you are doing.  Also, please schedule a routine follow up with me for your diabetes and high blood pressure.  Both have been well-controlled lately.  If you have any questions or concerns, please do not hesitate to call the office at [phone].  Take Care,   Jule Ser, DO

## 2016-07-02 NOTE — Telephone Encounter (Signed)
Patient requesting to be seen at the earliest time this morning for Left arm pain x 1 week. Pain worse since Saturday. Has been taking tylenol arthritis pill w/c is not effective. Denies chest pain. Scheduled in [facility] for 9:15 am.

## 2016-07-02 NOTE — Telephone Encounter (Signed)
Pt sch an appt for today because he does not want to have a Bill with the ER.  Patient having left arm pain X 1 week that has come back.

## 2016-07-02 NOTE — Progress Notes (Signed)
CC: left arm pain  HPI:  [name] is a 66 y.o. man with a past medical history listed below here today for complaint of left arm pain.  Onset of symptoms was 3 weeks ago.  Initially intermittent pain, but now constant and keeping him up at night due to pain.  He describes a throbbing, achy pain at his deltoid area with sharp, shooting radiating pain to his 1st and 2nd fingers.  He also describes paraspinal discomfort in the cervical region.  He denies any injury or trauma, but does a lot of heavy lifting at work.  He has not had weakness or loss of strength.  He continues to have full ROM with his shoulder in flexion and abduction.  He has used heating pad and Tylenol with some relief.  Has taken ASA on 2 occassions with better relief but has been hesitant to take any more due to his CKD.  In 05/2012, cervical x-rays demonstrated degenerative disc disease within the cervical spine with bilateral neural foraminal narrowing within C3-4 and C4-5.  Other ROS, denies CP, diaphoresis, joint pain, myalgias, SOB, no weight loss.  For details of today's visit and the status of his chronic medical issues please refer to the assessment and plan.   Past Medical History:  Diagnosis Date  . Chronic renal insufficiency 03/21/2006   CrCl <50 ml  . DDD (degenerative disc disease), cervical   . GERD (gastroesophageal reflux disease) 03/22/2006  . Hand numbness 10/07/2009  . Hyperlipidemia 03/22/2008  . Hypertension   . Hypertension 03/22/2006  . Left ventricular hypertrophy   . Left ventricular hypertrophy 03/22/2006  . Leg pain, left 11/15/2009  . Lumbar strain 07/15/2008  . Renal insufficiency   . Soft tissue disorder 08/2008  . Tobacco abuse 03/22/2006   2 ppd x 30 years    Review of Systems:  Please see pertinent ROS reviewed in HPI and problem based charting.   Physical Exam:  Vitals:   07/02/16 1002  BP: 132/70  Pulse: 63  Temp: 98 F (36.7 C)  TempSrc: Oral  SpO2: 100%  Weight:  154 lb 4.8 oz (70 kg)  Height: 5\' 11"  (1.803 m)   Physical Exam  Constitutional: He appears well-developed and well-nourished. No distress.  HENT:  Head: Normocephalic and atraumatic.  Right Ear: External ear normal.  Left Ear: External ear normal.  Nose: Nose normal.  Eyes: Conjunctivae and EOM are normal.  Neck:  Decreased ROM with cervical rotation to the L >R  Musculoskeletal: Normal range of motion. He exhibits tenderness. He exhibits no deformity.  Left Arm: No obvious deformity or rash.  Mild tenderness of deltoid area, normal ROM with flexion and abduction of shoulder, normal grip strength, normal strength with ER and IR (although did elicit some discomfort in his neck), normal sensation to light touch. Right Arm: No obvious deformity or rash.  Normal ROM, no tenderness, normal grip strength and strength with ER and IR.  Skin: Skin is warm and dry.     Assessment & Plan:   See Encounters Tab for problem based charting.  Patient discussed with Dr. Lynnae January.  Cervical disc disease Assessment: Symptoms consistent with cervical disc disease and radiculopathy based on presentation and exam findings.  He is without motor deficit and there is not any other symptoms at this time concerning for infectious, neoplastic, or inflammatory causes.  As a result, initial conservative management is reasonable and this was discussed with patient and his wife.  Plan: - continue non-narcotic  analgesia with Tylenol PRN and heat therapy.  Also, recommended to ice the area. - avoiding NSAIDs due to CKD - avoid provocative activities >> work note given for this week - patient declined PT evaluation - trial of gabapentin 300mg  BID (renally dosed) - repeat cervical x-rays showed loss of normal cervical lordosis, diffuse multilevel degenerative change, and no acute bony abnormality. - called patient to discuss results and options on 07/03/2016 at 11:14 AM.  Reports improvement in symptoms since OV and  is agreeable to further conservative management. - will RTC in 4 weeks for re-evaluation - at that time, depending on response can consider further imaging, course of corticosteroids, referral for injections, etc.

## 2016-07-03 DIAGNOSIS — M5090 Cervical disc disorder, unspecified, unspecified cervical region: Secondary | ICD-10-CM | POA: Insufficient documentation

## 2016-07-03 NOTE — Assessment & Plan Note (Addendum)
Assessment: Symptoms consistent with cervical disc disease and radiculopathy based on presentation and exam findings.  He is without motor deficit and there is not any other symptoms at this time concerning for infectious, neoplastic, or inflammatory causes.  As a result, initial conservative management is reasonable and this was discussed with patient and his wife.  Plan: - continue non-narcotic analgesia with Tylenol PRN and heat therapy.  Also, recommended to ice the area. - avoiding NSAIDs due to CKD - avoid provocative activities >> work note given for this week - patient declined PT evaluation - trial of gabapentin 300mg  BID (renally dosed) - repeat cervical x-rays showed loss of normal cervical lordosis, diffuse multilevel degenerative change, and no acute bony abnormality. - called patient to discuss results and options on 07/03/2016 at 11:14 AM.  Reports improvement in symptoms since OV and is agreeable to further conservative management. - will RTC in 4 weeks for re-evaluation - at that time, depending on response can consider further imaging, course of corticosteroids, referral for injections, etc.

## 2016-07-04 NOTE — Progress Notes (Signed)
Internal Medicine Clinic Attending  Case discussed with Dr. Wallace at the time of the visit.  We reviewed the resident's history and exam and pertinent patient test results.  I agree with the assessment, diagnosis, and plan of care documented in the resident's note.  

## 2016-08-02 ENCOUNTER — Ambulatory Visit (INDEPENDENT_AMBULATORY_CARE_PROVIDER_SITE_OTHER): Payer: BLUE CROSS/BLUE SHIELD | Admitting: Internal Medicine

## 2016-08-02 ENCOUNTER — Encounter: Payer: BLUE CROSS/BLUE SHIELD | Admitting: Internal Medicine

## 2016-08-02 VITALS — BP 135/67 | HR 54 | Temp 98.3°F | Ht 71.0 in | Wt 156.6 lb

## 2016-08-02 DIAGNOSIS — M5090 Cervical disc disorder, unspecified, unspecified cervical region: Secondary | ICD-10-CM | POA: Diagnosis not present

## 2016-08-02 DIAGNOSIS — M5030 Other cervical disc degeneration, unspecified cervical region: Secondary | ICD-10-CM | POA: Diagnosis not present

## 2016-08-02 DIAGNOSIS — N183 Chronic kidney disease, stage 3 unspecified: Secondary | ICD-10-CM

## 2016-08-02 DIAGNOSIS — E559 Vitamin D deficiency, unspecified: Secondary | ICD-10-CM

## 2016-08-02 DIAGNOSIS — E1122 Type 2 diabetes mellitus with diabetic chronic kidney disease: Secondary | ICD-10-CM | POA: Diagnosis not present

## 2016-08-02 LAB — GLUCOSE, CAPILLARY: Glucose-Capillary: 91 mg/dL (ref 65–99)

## 2016-08-02 LAB — POCT GLYCOSYLATED HEMOGLOBIN (HGB A1C): Hemoglobin A1C: 5.4

## 2016-08-02 NOTE — Patient Instructions (Addendum)
For your pain, we will await your blood work before we write for any new medication.   I will call you tomorrow with the dose of the medication and if we need to write for vitamin supplementation.

## 2016-08-02 NOTE — Progress Notes (Signed)
   CC: shoulder pain  HPI:  [name] is a 66 y.o. who presents today with his wife for shoulder pain. Please see assessment & plan for status of chronic medical problems.   Past Medical History:  Diagnosis Date  . Chronic renal insufficiency 03/21/2006   CrCl <50 ml  . DDD (degenerative disc disease), cervical   . GERD (gastroesophageal reflux disease) 03/22/2006  . Hand numbness 10/07/2009  . Hyperlipidemia 03/22/2008  . Hypertension   . Hypertension 03/22/2006  . Left ventricular hypertrophy   . Left ventricular hypertrophy 03/22/2006  . Leg pain, left 11/15/2009  . Lumbar strain 07/15/2008  . Renal insufficiency   . Soft tissue disorder 08/2008  . Tobacco abuse 03/22/2006   2 ppd x 30 years    Review of Systems:  Please see each problem below for a pertinent review of systems.  Physical Exam:  Vitals:   08/02/16 1504  BP: 135/67  Pulse: (!) 54  Temp: 98.3 F (36.8 C)  TempSrc: Oral  SpO2: 99%  Weight: 156 lb 9.6 oz (71 kg)  Height: 5\' 11"  (1.803 m)   Physical Exam  Constitutional: He is oriented to person, place, and time. No distress.  HENT:  Head: Normocephalic and atraumatic.  Eyes: Conjunctivae are normal. No scleral icterus.  Pulmonary/Chest: Effort normal. No respiratory distress.  Musculoskeletal:  Tenderness to palpation overlying the left trapezius area. No pain with internal or external rotation. No pain with abduction/adduction. No pain with shoulder flexion/extension.  Neurological: He is alert and oriented to person, place, and time.  Skin: Skin is warm and dry. He is not diaphoretic.    Assessment & Plan:   See Encounters Tab for problem based charting.  Patient discussed with Dr. Daryll Drown

## 2016-08-03 DIAGNOSIS — E559 Vitamin D deficiency, unspecified: Secondary | ICD-10-CM | POA: Insufficient documentation

## 2016-08-03 LAB — BMP8+ANION GAP
Anion Gap: 13 mmol/L (ref 10.0–18.0)
BUN/Creatinine Ratio: 15 (ref 10–24)
BUN: 30 mg/dL — ABNORMAL HIGH (ref 8–27)
CO2: 23 mmol/L (ref 18–29)
Calcium: 9.5 mg/dL (ref 8.6–10.2)
Chloride: 102 mmol/L (ref 96–106)
Creatinine, Ser: 1.98 mg/dL — ABNORMAL HIGH (ref 0.76–1.27)
GFR calc Af Amer: 40 mL/min/{1.73_m2} — ABNORMAL LOW (ref >59)
GFR calc non Af Amer: 34 mL/min/{1.73_m2} — ABNORMAL LOW (ref >59)
Glucose: 92 mg/dL (ref 65–99)
Potassium: 4.3 mmol/L (ref 3.5–5.2)
Sodium: 138 mmol/L (ref 134–144)

## 2016-08-03 LAB — VITAMIN D 25 HYDROXY (VIT D DEFICIENCY, FRACTURES): Vit D, 25-Hydroxy: 20.9 ng/mL — ABNORMAL LOW (ref 30.0–100.0)

## 2016-08-03 MED ORDER — VITAMIN D3 10 MCG (400 UNIT) PO TABS: 800.0000 [IU] | ORAL_TABLET | Freq: Every day | ORAL | 1 refills | Status: DC

## 2016-08-03 MED ORDER — GABAPENTIN 600 MG PO TABS: 600.0000 mg | ORAL_TABLET | Freq: Two times a day (BID) | ORAL | 1 refills | Status: DC

## 2016-08-03 NOTE — Assessment & Plan Note (Signed)
Assessment He has a history of diabetes with A1c 11.7 in December 2016 though have trended 5.4-5.6 thereafter which may underestimate his true glycemic control in the setting of anemia from his chronic kidney disease. Given the consideration of steroid injection, it would be prudent to reassess his glycemic control.  Plan Check A1c  ADDENDUM 08/03/2016  8:25 AM:  A1c 5.4 though it underestimates his true glycemic control given his chronic renal insufficiency.

## 2016-08-03 NOTE — Assessment & Plan Note (Addendum)
Assessment Over the last two months, he experience sharp, shooting pain that radiates from his neck to his shoulder and down his arm. Lifting heavy objects worsens his pain and thus finds it difficult to perform his job. He denies any recent injury and find no relief with gabapentin 300 mg up to two or even three times daily or acetaminophen 650 mg three times a day. His most recent C-spine XR notes multilevel degenerative disc disease.  Plan -Recommended physical therapy though he again seemed reluctant as he felt he was active enough. I explained even one session can help teach specific exercises to strengthen the surrounding muscles though he declined. -Referred to PM&R for consideration of joint injection -Check BMET today to reassess his kidney function prior to uptitration of gabapentin -Check Vitamin D given disc disease and chronic renal insufficiency  ADDENDUM 08/03/2016  11:18 AM:  BMET with Crt 2.0 at baseline 1.8-2.0 over the last 2 years. I will recommend he uptitrate gabapentin. I initially planned to give him 100 mg capsules with his old prescription of 300 mg twice daily so he can uptitrate though he was confused with these instructions. I will double his normal dose which is just short of the maximum of 700 mg twice daily he can safely take with his degree of renal insufficiency. I reviewed side effects of dizziness and sedation with him over the phone, and he acknowledged understanding.

## 2016-08-03 NOTE — Assessment & Plan Note (Signed)
Assessment Over the last two months, he experience sharp, shooting pain that radiates from his neck to his shoulder and down his arm. Lifting heavy objects worsens his pain and thus finds it difficult to perform his job. He denies any recent injury and find no relief with gabapentin 300 mg up to two or even three times daily or acetaminophen 650 mg three times a day. His most recent C-spine XR notes multilevel degenerative disc disease.  Plan -Recommended physical therapy though he again seemed reluctant as he felt he was active enough. I explained even one session can help teach specific exercises to strengthen the surrounding muscles though he declined. -Referred to PM&R for consideration of joint injection -Check BMET today to reassess his kidney function prior to uptitration of gabapentin -Check Vitamin D given disc disease and chronic renal insufficiency

## 2016-08-03 NOTE — Addendum Note (Signed)
Addended by: Riccardo Dubin on: 08/03/2016 11:24 AM   Modules accepted: Orders

## 2016-08-06 NOTE — Progress Notes (Signed)
Internal Medicine Clinic Attending  Case discussed with Dr. Patel,Rushil at the time of the visit.  We reviewed the resident's history and exam and pertinent patient test results.  I agree with the assessment, diagnosis, and plan of care documented in the resident's note.  

## 2016-10-18 ENCOUNTER — Encounter: Payer: Self-pay | Admitting: Internal Medicine

## 2016-10-18 ENCOUNTER — Ambulatory Visit (INDEPENDENT_AMBULATORY_CARE_PROVIDER_SITE_OTHER): Payer: BLUE CROSS/BLUE SHIELD | Admitting: Internal Medicine

## 2016-10-18 VITALS — BP 158/72 | HR 57 | Temp 97.7°F | Ht 71.0 in | Wt 153.2 lb

## 2016-10-18 DIAGNOSIS — E1122 Type 2 diabetes mellitus with diabetic chronic kidney disease: Secondary | ICD-10-CM | POA: Diagnosis not present

## 2016-10-18 DIAGNOSIS — X58XXXA Exposure to other specified factors, initial encounter: Secondary | ICD-10-CM | POA: Diagnosis not present

## 2016-10-18 DIAGNOSIS — I129 Hypertensive chronic kidney disease with stage 1 through stage 4 chronic kidney disease, or unspecified chronic kidney disease: Secondary | ICD-10-CM

## 2016-10-18 DIAGNOSIS — S39012A Strain of muscle, fascia and tendon of lower back, initial encounter: Secondary | ICD-10-CM | POA: Diagnosis not present

## 2016-10-18 DIAGNOSIS — N183 Chronic kidney disease, stage 3 unspecified: Secondary | ICD-10-CM

## 2016-10-18 DIAGNOSIS — I10 Essential (primary) hypertension: Secondary | ICD-10-CM

## 2016-10-18 DIAGNOSIS — S335XXA Sprain of ligaments of lumbar spine, initial encounter: Secondary | ICD-10-CM

## 2016-10-18 DIAGNOSIS — Z87891 Personal history of nicotine dependence: Secondary | ICD-10-CM | POA: Diagnosis not present

## 2016-10-18 DIAGNOSIS — Z79899 Other long term (current) drug therapy: Secondary | ICD-10-CM

## 2016-10-18 LAB — GLUCOSE, CAPILLARY: Glucose-Capillary: 79 mg/dL (ref 65–99)

## 2016-10-18 LAB — POCT GLYCOSYLATED HEMOGLOBIN (HGB A1C): Hemoglobin A1C: 5.7

## 2016-10-18 MED ORDER — GABAPENTIN 600 MG PO TABS: 600.0000 mg | ORAL_TABLET | Freq: Two times a day (BID) | ORAL | 1 refills | Status: DC

## 2016-10-18 NOTE — Assessment & Plan Note (Signed)
A: A1c today is 5.7 off medications.  P: - continue to follow A1c off medications

## 2016-10-18 NOTE — Patient Instructions (Signed)
Thank you for coming to see me today. It was a pleasure. Today we talked about:   Blood pressure: - Please check your BP 3-4 times per week and write down your numbers. Bring this to your follow up appointment along with your home cuff. - continue your other medications as is  Back Pain: - refilled your gabapentin - ordered physical therapy - use heating pad and ice - do not use ibuprofen or aleve or other NSAIDs  Please follow-up with [place] in 1 month  If you have any questions or concerns, please do not hesitate to call the office at [phone].  Take Care,   Jule Ser, DO

## 2016-10-18 NOTE — Progress Notes (Signed)
   CC: here for f/u HTN and DM  HPI:  [name] is a 66 y.o. man with a past medical history listed below here today for follow up of his HTN and DM.   For details of today's visit and the status of his chronic medical issues please refer to the assessment and plan.   Past Medical History:  Diagnosis Date  . Chronic renal insufficiency 03/21/2006   CrCl <50 ml  . DDD (degenerative disc disease), cervical   . GERD (gastroesophageal reflux disease) 03/22/2006  . Hand numbness 10/07/2009  . Hyperlipidemia 03/22/2008  . Hypertension   . Hypertension 03/22/2006  . Left ventricular hypertrophy   . Left ventricular hypertrophy 03/22/2006  . Leg pain, left 11/15/2009  . Lumbar strain 07/15/2008  . Renal insufficiency   . Soft tissue disorder 08/2008  . Tobacco abuse 03/22/2006   2 ppd x 30 years   Review of Systems:  Please see pertinent ROS reviewed in HPI and problem based charting.   Physical Exam:  Vitals:   10/18/16 1412  BP: (!) 158/72  Pulse: (!) 57  Temp: 97.7 F (36.5 C)  TempSrc: Oral  SpO2: 99%  Weight: 153 lb 3.2 oz (69.5 kg)  Height: 5\' 11"  (1.803 m)   General: sitting up in chair, NAD Ext: warm and well perfused, no pedal edema, 5/5 LE strength Back: mild paraspinal tenderness Neuro: alert and oriented X3, cranial nerves II-XII grossly intact   Assessment & Plan:   See Encounters Tab for problem based charting.  Patient discussed with Dr. Daryll Drown.  Essential hypertension A: BP still elevated today at 158/72.  Reports he took his medications.  No other complaints related to BP.  States takes BP at home and is routinely in the SBP 130s.    P: - I suggested we increase his hydralazine to 50 mg TID from BID.  Patient declines this intervention and prefers to monitor BP at home about 3-4 times per week for 1 month, bring BP log to follow up appointment and make changes then if needed -Continue amlodipine 10 mg QD -Continue lisinopril 40 mg  QD -Continue hydralazine 50 mg BID -Continue clonidine 0.1 mg BID - RTC 1 month  Type 2 diabetes mellitus with stage 3 chronic kidney disease, without long-term current use of insulin (HCC) A: A1c today is 5.7 off medications.  P: - continue to follow A1c off medications  LUMBAR STRAIN A: Patient reports recent onset of lumbar back pain with associated hamstring tightness on the right as well as radicular type symptoms with radiating discomfort.  No leg weakness noted by patient or on exam.  Has only been taking aspirin as needed for pain.  P: - recommended re-trial of gabapentin as needed as this significantly helped his cervical radiculopathy.  New Rx sent - advised to avoid NSAIDs with his CKD - trial of heat and ice - PT ordered - RTC in 4 weeks for follow up  Chronic kidney disease A: Follows with Dr. Posey Pronto at [facility].  Recent BMET May 2018 showed stable CKD with creatinine at baseline over the past couple years.  Has not been to see nephrology for a couple years.   P:  - recommended he return to follow up with nephrology

## 2016-10-18 NOTE — Assessment & Plan Note (Signed)
A: BP still elevated today at 158/72.  Reports he took his medications.  No other complaints related to BP.  States takes BP at home and is routinely in the SBP 130s.    P: - I suggested we increase his hydralazine to 50 mg TID from BID.  Patient declines this intervention and prefers to monitor BP at home about 3-4 times per week for 1 month, bring BP log to follow up appointment and make changes then if needed -Continue amlodipine 10 mg QD -Continue lisinopril 40 mg QD -Continue hydralazine 50 mg BID -Continue clonidine 0.1 mg BID - RTC 1 month

## 2016-10-18 NOTE — Assessment & Plan Note (Signed)
A: Patient reports recent onset of lumbar back pain with associated hamstring tightness on the right as well as radicular type symptoms with radiating discomfort.  No leg weakness noted by patient or on exam.  Has only been taking aspirin as needed for pain.  P: - recommended re-trial of gabapentin as needed as this significantly helped his cervical radiculopathy.  New Rx sent - advised to avoid NSAIDs with his CKD - trial of heat and ice - PT ordered - RTC in 4 weeks for follow up

## 2016-10-18 NOTE — Assessment & Plan Note (Signed)
A: Follows with Dr. Posey Pronto at [facility].  Recent BMET May 2018 showed stable CKD with creatinine at baseline over the past couple years.  Has not been to see nephrology for a couple years.   P:  - recommended he return to follow up with nephrology

## 2016-10-24 ENCOUNTER — Encounter: Payer: Self-pay | Admitting: Internal Medicine

## 2016-10-24 ENCOUNTER — Ambulatory Visit (INDEPENDENT_AMBULATORY_CARE_PROVIDER_SITE_OTHER): Payer: BLUE CROSS/BLUE SHIELD | Admitting: Internal Medicine

## 2016-10-24 VITALS — BP 131/64 | HR 60 | Temp 97.6°F | Ht 71.0 in | Wt 154.9 lb

## 2016-10-24 DIAGNOSIS — R42 Dizziness and giddiness: Secondary | ICD-10-CM | POA: Diagnosis not present

## 2016-10-24 DIAGNOSIS — N183 Chronic kidney disease, stage 3 unspecified: Secondary | ICD-10-CM

## 2016-10-24 MED ORDER — GABAPENTIN 300 MG PO CAPS: 300.0000 mg | ORAL_CAPSULE | Freq: Two times a day (BID) | ORAL | 0 refills | Status: DC

## 2016-10-24 NOTE — Patient Instructions (Addendum)
It was nice to meet you today, William Perkins.   Please STOP taking gabapentin 600mg  2 times a day and START taking gabapentin 300mg  1 tablet 2 times a day. I have sent a new prescription for the 300mg  tablets to your pharmacy.  You can cut the 600mg  tablets in half and take half a tablet 2 times a day until you run out of this prescription.   Please call or make an appointment if your symptoms worsen.

## 2016-10-24 NOTE — Progress Notes (Signed)
   CC: Dizziness  HPI:  [name] is a 66 y.o. male with PMH listed below who presents to clinic for an episode of dizziness. Please see problem based assessment and plan for further details.  Past Medical History:  Diagnosis Date  . Chronic renal insufficiency 03/21/2006   CrCl <50 ml  . DDD (degenerative disc disease), cervical   . GERD (gastroesophageal reflux disease) 03/22/2006  . Hand numbness 10/07/2009  . Hyperlipidemia 03/22/2008  . Hypertension   . Hypertension 03/22/2006  . Left ventricular hypertrophy   . Left ventricular hypertrophy 03/22/2006  . Leg pain, left 11/15/2009  . Lumbar strain 07/15/2008  . Renal insufficiency   . Soft tissue disorder 08/2008  . Tobacco abuse 03/22/2006   2 ppd x 30 years   Review of Systems:   Review of Systems  Constitutional: Negative for chills, fever and malaise/fatigue.  Respiratory: Negative for cough and shortness of breath.   Cardiovascular: Negative for chest pain and palpitations.  Neurological: Negative for dizziness, sensory change, focal weakness, weakness and headaches.    Physical Exam:  Vitals:   10/24/16 0846  BP: 131/64  Pulse: 60  Temp: 97.6 F (36.4 C)  TempSrc: Oral  SpO2: 100%  Weight: 154 lb 14.4 oz (70.3 kg)  Height: 5\' 11"  (1.803 m)   General: pleasant male, Well-nourished, well-developed, no acute distress HENT: NCAT, neck supple and FROM, MMM, OP clear without exudates or erythema Cardiac: regular rate and rhythm, nl S1/S2, no murmurs, rubs or gallops  Pulm: CTAB, no wheezes or crackles, no increased work of breathing  Abd: soft, NTND, bowel sounds present  Neuro: A&Ox3, CN II-XII intact, no focal deficits noted  Ext: warm and well perfused, no peripheral edema     Assessment & Plan:   See Encounters Tab for problem based charting.  Patient seen with Dr. Angelia Mould

## 2016-10-24 NOTE — Addendum Note (Signed)
Addended by: Gilles Chiquito B on: 10/24/2016 04:03 PM   Modules accepted: Level of Service

## 2016-10-24 NOTE — Assessment & Plan Note (Addendum)
Patient accompanied by sister. He presents after an episode of dizziness yesterday morning (8/21) that he describes as lightheadedness and feeling off balance. He also felt nauseous during this episode, but no emesis. He was at work at that time and had to leave due to dizziness. Her sister, when she arrived at the house 2 hours later she found him sleeping. Patient has been asymptomatic since waking up yesterday afternoon. States he took his BG during this episode and it was 145. He then proceeded to take metformin 500 mg tablet (left over from a previous prescription) with resolution of symptoms. Patient believes this episode was due to elevated BG (hypoglycemic episodes presented similarly in the past) and wishes to re-start metformin. Per Dr. Alcario Drought note, his A1c 5.7 without oral or injectable agents. He was seen 5 days ago in clinic (8/16) and prescribed gabapentin 600 mg twice a day for lumbar back pain. States he took 1 tablet yesterday morning and has not taken any since then. He denies sensation of room spinning, tinnitus, hearing loss, chest pain, shortness of breath, weakness, or fatigue.  Suspect this episode of lightheadedness was secondary to gabapentin side effect (especially in the setting of CDK Stage III) vs episode of hypoglycemia (BG 145 at the time).   - Will reduce gabapentin dose from 600 mg BID to 300 mg BID given that he has responded well to this dose in the past. Prescription sent to pharmacy.  - Advised he can cut 600mg  tablets in half and take half a tablet BID until he runs out of 600mg  tablets - Patient educated on normal pre and post-prandial BG in diabetes

## 2016-10-24 NOTE — Progress Notes (Signed)
Internal Medicine Clinic Attending  Case discussed with Dr. Wallace at the time of the visit.  We reviewed the resident's history and exam and pertinent patient test results.  I agree with the assessment, diagnosis, and plan of care documented in the resident's note.  

## 2016-10-24 NOTE — Assessment & Plan Note (Signed)
Recommended to re-establish with his nephrologist (Dr. Posey Pronto at [facility]). He was last seen in 2014. Per patient, office is requesting referral for patient to be seen again.   - Referral to nephrology placed

## 2016-10-28 NOTE — Progress Notes (Signed)
Internal Medicine Clinic Attending  I saw and evaluated the patient.  I personally confirmed the key portions of the history and exam documented by Dr. Santos-Sanchez and I reviewed pertinent patient test results.  The assessment, diagnosis, and plan were formulated together and I agree with the documentation in the resident's note. 

## 2016-11-09 ENCOUNTER — Encounter: Payer: Self-pay | Admitting: Physical Therapy

## 2016-11-09 ENCOUNTER — Ambulatory Visit: Payer: BLUE CROSS/BLUE SHIELD | Attending: Internal Medicine | Admitting: Physical Therapy

## 2016-11-09 DIAGNOSIS — G8929 Other chronic pain: Secondary | ICD-10-CM | POA: Insufficient documentation

## 2016-11-09 DIAGNOSIS — M62830 Muscle spasm of back: Secondary | ICD-10-CM | POA: Diagnosis not present

## 2016-11-09 DIAGNOSIS — M545 Low back pain: Secondary | ICD-10-CM | POA: Diagnosis not present

## 2016-11-09 DIAGNOSIS — M6281 Muscle weakness (generalized): Secondary | ICD-10-CM | POA: Insufficient documentation

## 2016-11-12 ENCOUNTER — Encounter: Payer: Self-pay | Admitting: Physical Therapy

## 2016-11-12 NOTE — Therapy (Addendum)
[place], [place], [address] Phone: [phone]   Fax:  [phone]  Physical Therapy Evaluation  Patient Details  Name: William Perkins MRN: [id number] Date of Birth: Dec 23, 1950 Referring Provider: Jule Ser DO   Encounter Date: 11/10/2078      PT End of Session - 11/12/16 0902    Visit Number 1   Number of Visits 16   Date for PT Re-Evaluation 01/07/17   Authorization Type BCBS    PT Start Time 0930   PT Stop Time 1014   PT Time Calculation (min) 44 min   Activity Tolerance Patient tolerated treatment well   Behavior During Therapy [facility] for tasks assessed/performed      Past Medical History:  Diagnosis Date  . Chronic renal insufficiency 03/21/2006   CrCl <50 ml  . DDD (degenerative disc disease), cervical   . GERD (gastroesophageal reflux disease) 03/22/2006  . Hand numbness 10/07/2009  . Hyperlipidemia 03/22/2008  . Hypertension   . Hypertension 03/22/2006  . Left ventricular hypertrophy   . Left ventricular hypertrophy 03/22/2006  . Leg pain, left 11/15/2009  . Lumbar strain 07/15/2008  . Renal insufficiency   . Soft tissue disorder 08/2008  . Tobacco abuse 03/22/2006   2 ppd x 30 years    History reviewed. No pertinent surgical history.  There were no vitals filed for this visit.       Subjective Assessment - 11/12/16 0900    Subjective Patient reports he has had an onset of lower back pain for a couple of years. He has been taking medication for his back pain but he feels like it is not doing any good. He has the most pain when he is standing and walking. His pain radiates into bilateral buttock.    Limitations Standing;Walking   How long can you stand comfortably? Has to stand up at work which increases his pain    How long can you walk comfortably? walks at work all day but has pain    Diagnostic tests Nothing recent for the lower back    Patient Stated Goals [place] when  standing aand walking    Currently in Pain? Yes   Pain Score 9   when standing and walking    Pain Location Back   Pain Orientation Lower   Pain Descriptors / Indicators Aching   Pain Type Chronic pain   Pain Onset More than a month ago   Pain Frequency Constant   Aggravating Factors  standing and walking    Pain Relieving Factors rest    Effect of Pain on Daily Activities difficulty perfroming ADL's             [facility] PT Assessment - 11/12/16 0001      Assessment   Medical Diagnosis Low back pain    Referring Provider Jule Ser DO    Onset Date/Surgical Date --  3-4 years    Hand Dominance Right   Next MD Visit 11/15/2016   Prior Therapy none      Precautions   Precautions None     Restrictions   Weight Bearing Restrictions No     Balance Screen   Has the patient fallen in the past 6 months No   Has the patient had a decrease in activity level because of a fear of falling?  No   Is the patient reluctant to leave their home because of a fear of falling?  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      Cognition   Overall Cognitive Status Within Functional Limits for tasks assessed   Attention Focused   Focused Attention Appears intact   Memory Appears intact   Awareness Appears intact   Problem Solving Appears intact   Executive Function Reasoning     Observation/Other Assessments   Observations Sits with hips forward in extension    Focus on Therapeutic Outcomes (FOTO)  54% limitation      Sensation   Light Touch Appears Intact   Additional Comments denies parathesias      Coordination   Gross Motor Movements are Fluid and Coordinated Yes   Fine Motor Movements are Fluid and Coordinated Yes     Posture/Postural Control   Posture Comments flat lumbar lordosis;      AROM    Lumbar Flexion 25% limited with minor pain    Lumbar Extension 50% limited with pain    Lumbar - Right Side Bend increased pressure    Lumbar - Left Side Bend increased pressure    Lumbar - Right Rotation normal    Lumbar - Left Rotation normal      PROM   Overall PROM Comments tight internal rotation bilateral; normal hip flexion    Right/Left Hip Right;Left     Strength   Overall Strength Comments fair core contraction. Needs cuing for proper breathing    Right/Left Hip Right;Left   Right Hip Flexion 4+/5   Right Hip ABduction 4+/5   Right Hip ADduction 4+/5   Left Hip Flexion 4+/5   Left Hip ABduction 4+/5   Left Hip ADduction 4+/5   Right/Left Knee Right;Left   Right Knee Flexion 5/5   Right Knee Extension 5/5   Left Knee Flexion 5/5   Left Knee Extension 5/5     Flexibility   Soft Tissue Assessment /Muscle Length yes   Hamstrings 25 degrees limited 90/90 bilateral      Palpation   Palpation comment very tight lumbar paraspinals             Objective measurements completed on examination: See above findings.          [facility] Adult PT Treatment/Exercise - 11/12/16 0001      Lumbar Exercises: Stretches   Passive Hamstring Stretch Limitations seated hamstring stretch 3x20 sec hold    Quadruped Mid Back Stretch Limitations standing sink stretch    Piriformis Stretch Limitations peiridormis stretch seated 2x30sec hold bilateral      Lumbar Exercises: Supine   Clam Limitations x10 red band with abdominal breathing    Bridge Limitations 2x10    Straight Leg Raises Limitations 2x10                 PT Education - 11/12/16 0901    Education provided Yes   Education Details reviewed HEP symptom mangement    Person(s) Educated Patient   Methods Explanation;Demonstration;Tactile cues;Verbal cues   Comprehension Returned demonstration;Verbal cues required;Verbalized understanding;Tactile cues required;Need further instruction          PT Short Term  Goals - 11/12/16 0916      PT SHORT TERM GOAL #1   Title Patient will demsotrate a good core contraction    Time 4   Period Weeks  Status New     PT SHORT TERM GOAL #2   Title Patient will demonstrate 5/5 gross bilateral lower extremity strength    Time 4   Period Weeks   Status New     PT SHORT TERM GOAL #3   Title Patient will be independent with basic HEP    Time 4   Period Weeks   Status New           PT Long Term Goals - 11/12/16 8466      PT LONG TERM GOAL #1   Title Patient will go up/down 8 steps without increased pain    Time 8   Period Weeks   Status New     PT LONG TERM GOAL #2   Title Patient will walk 1 mile without self reported pain in order to perfrom work tasks    Time 8   Period Weeks   Status New     PT LONG TERM GOAL #3   Title Patient will stand for 1 hour at work without increased lower back pain    Time 8   Period Weeks   Status New                Plan - 11/12/16 0908    Clinical Impression Statement Patient is a 66 year old male who presents with low back pain that increases when he stands or walks at work. He also does a large amount of lifting at his job. Signs and symptoms are consitent with lumbar stenosis. He has some tightness in his hips and weakness in his core. He would benefit from skilled therapy to improve his core strength and lifting technique.    Clinical Presentation Stable   Clinical Decision Making Low   Rehab Potential Good   PT Frequency 2x / week   PT Duration 8 weeks   PT Treatment/Interventions ADLs/Self Care Home Management;Cryotherapy;Electrical Stimulation;Gait training;Stair training;Moist Heat;Therapeutic exercise;Therapeutic activities;Neuromuscular re-education;Patient/family education;Passive range of motion;Manual techniques;Splinting;Taping   PT Next Visit Plan review lifting technique and squat technique for work; Consider upper extremity core strengthening exercises to work on stabilization  while working; Review HEP   PT Home Exercise Plan sink stretch; pririformis stretch; hamstring stretch; bridge, straight leg raise; supine clamshell with abdominal breathing    Consulted and Agree with Plan of Care Patient      Patient will benefit from skilled therapeutic intervention in order to improve the following deficits and impairments:  Decreased strength, Pain, Decreased activity tolerance, Increased fascial restricitons, Decreased range of motion, Decreased safety awareness, Improper body mechanics  Visit Diagnosis: Chronic bilateral low back pain without sciatica - Plan: PT plan of care cert/re-cert  Muscle weakness (generalized) - Plan: PT plan of care cert/re-cert  Muscle spasm of back - Plan: PT plan of care cert/re-cert  G codes: changing and maintaining body position  Current: 20-40% CJ  Goal CI 1-20 %    Problem List Patient Active Problem List   Diagnosis Date Noted  . Episode of dizziness 10/24/2016  . Vitamin D insufficiency 08/03/2016  . Cervical disc disease 07/03/2016  . Type 2 diabetes mellitus with stage 3 chronic kidney disease, without long-term current use of insulin ([place]) 02/10/2015  . Cough 07/06/2013  . Other emphysema ([place]) 05/22/2012  . Constipation 05/22/2012  . Sensation problem 05/22/2012  . Healthcare maintenance 03/28/2012  . LUMBAR STRAIN 07/15/2008  . Hyperlipidemia 03/22/2006  . TOBACCO ABUSE 03/22/2006  . Essential hypertension 03/22/2006  . GERD 03/22/2006  .  Chronic kidney disease 03/21/2006    Carney Living  PT DPT  11/12/2016, 9:51 AM  [facility] [address] [place], [place], [address] Phone: [phone]   Fax:  [phone]  Name: Khris Jansson MRN: [id number] Date of Birth: 06/25/1950

## 2016-11-12 NOTE — Therapy (Deleted)
[place], [place], [address] Phone: [phone]   Fax:  [phone]  Physical Therapy Treatment  Patient Details  Name: William Perkins MRN: [id number] Date of Birth: 01-Apr-1950 Referring Provider: Jule Ser DO   Encounter Date: 11/09/2016      PT End of Session - 11/12/16 0902    Visit Number 1   Number of Visits 16   Date for PT Re-Evaluation 01/07/17   Authorization Type BCBS    PT Start Time 0930   PT Stop Time 1014   PT Time Calculation (min) 44 min   Activity Tolerance Patient tolerated treatment well   Behavior During Therapy [facility] for tasks assessed/performed      Past Medical History:  Diagnosis Date  . Chronic renal insufficiency 03/21/2006   CrCl <50 ml  . DDD (degenerative disc disease), cervical   . GERD (gastroesophageal reflux disease) 03/22/2006  . Hand numbness 10/07/2009  . Hyperlipidemia 03/22/2008  . Hypertension   . Hypertension 03/22/2006  . Left ventricular hypertrophy   . Left ventricular hypertrophy 03/22/2006  . Leg pain, left 11/15/2009  . Lumbar strain 07/15/2008  . Renal insufficiency   . Soft tissue disorder 08/2008  . Tobacco abuse 03/22/2006   2 ppd x 30 years    History reviewed. No pertinent surgical history.  There were no vitals filed for this visit.      Subjective Assessment - 11/12/16 0900    Subjective Patient reports he has had an onset of lower back pain for a couple of years. He has been taking medication for his back pain but he feels like it is not doing any good. He has the most pain when he is standing and walking. His pain radiates into bilateral buttock.    Limitations Standing;Walking   How long can you stand comfortably? Has to stand up at work which increases his pain    How long can you walk comfortably? walks at work all day but has pain    Diagnostic tests Nothing recent for the lower back    Patient Stated Goals [place] when  standing aand walking    Currently in Pain? Yes   Pain Score 9   when standing and walking    Pain Location Back   Pain Orientation Lower   Pain Descriptors / Indicators Aching   Pain Type Chronic pain   Pain Onset More than a month ago   Pain Frequency Constant   Aggravating Factors  standing and walking    Pain Relieving Factors rest    Effect of Pain on Daily Activities difficulty perfroming ADL's             [facility] PT Assessment - 11/12/16 0001      Assessment   Medical Diagnosis Low back pain    Referring Provider Jule Ser DO    Onset Date/Surgical Date --  3-4 years    Hand Dominance Right   Next MD Visit 11/15/2016   Prior Therapy none      Precautions   Precautions None     Restrictions   Weight Bearing Restrictions No     Balance Screen   Has the patient fallen in the past 6 months No   Has the patient had a decrease in activity level because of a fear of falling?  No   Is the patient reluctant to leave their home because of a fear of falling?  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      Cognition   Overall Cognitive Status Within Functional Limits for tasks assessed   Attention Focused   Focused Attention Appears intact   Memory Appears intact   Awareness Appears intact   Problem Solving Appears intact   Executive Function Reasoning     Observation/Other Assessments   Observations Sits with hips forward in extension    Focus on Therapeutic Outcomes (FOTO)  54% limitation      Sensation   Light Touch Appears Intact   Additional Comments denies parathesias      Coordination   Gross Motor Movements are Fluid and Coordinated Yes   Fine Motor Movements are Fluid and Coordinated Yes     Posture/Postural Control   Posture Comments flat lumbar lordosis;      AROM    Lumbar Flexion 25% limited with minor pain    Lumbar Extension 50% limited with pain    Lumbar - Right Side Bend increased pressure    Lumbar - Left Side Bend increased pressure    Lumbar - Right Rotation normal    Lumbar - Left Rotation normal      PROM   Overall PROM Comments tight internal rotation bilateral; normal hip flexion    Right/Left Hip Right;Left     Strength   Overall Strength Comments fair core contraction. Needs cuing for proper breathing    Right/Left Hip Right;Left   Right Hip Flexion 4+/5   Right Hip ABduction 4+/5   Right Hip ADduction 4+/5   Left Hip Flexion 4+/5   Left Hip ABduction 4+/5   Left Hip ADduction 4+/5   Right/Left Knee Right;Left   Right Knee Flexion 5/5   Right Knee Extension 5/5   Left Knee Flexion 5/5   Left Knee Extension 5/5     Flexibility   Soft Tissue Assessment /Muscle Length yes   Hamstrings 25 degrees limited 90/90 bilateral      Palpation   Palpation comment very tight lumbar paraspinals                      [facility] Adult PT Treatment/Exercise - 11/12/16 0001      Lumbar Exercises: Stretches   Passive Hamstring Stretch Limitations seated hamstring stretch 3x20 sec hold    Quadruped Mid Back Stretch Limitations standing sink stretch    Piriformis Stretch Limitations peiridormis stretch seated 2x30sec hold bilateral      Lumbar Exercises: Supine   Clam Limitations x10 red band with abdominal breathing    Bridge Limitations 2x10    Straight Leg Raises Limitations 2x10                 PT Education - 11/12/16 0901    Education provided Yes   Education Details reviewed HEP symptom mangement    Person(s) Educated Patient   Methods Explanation;Demonstration;Tactile cues;Verbal cues   Comprehension Returned demonstration;Verbal cues required;Verbalized understanding;Tactile cues required;Need further instruction          PT Short Term Goals - 11/12/16 0916      PT SHORT TERM GOAL #1   Title Patient  will demsotrate a good core contraction    Time 4   Period Weeks   Status New     PT SHORT TERM GOAL #  2   Title Patient will demonstrate 5/5 gross bilateral lower extremity strength    Time 4   Period Weeks   Status New     PT SHORT TERM GOAL #3   Title Patient will be independent with basic HEP    Time 4   Period Weeks   Status New           PT Long Term Goals - 11/12/16 3976      PT LONG TERM GOAL #1   Title Patient will go up/down 8 steps without increased pain    Time 8   Period Weeks   Status New     PT LONG TERM GOAL #2   Title Patient will walk 1 mile without self reported pain in order to perfrom work tasks    Time 8   Period Weeks   Status New     PT LONG TERM GOAL #3   Title Patient will stand for 1 hour at work without increased lower back pain    Time 8   Period Weeks   Status New               Plan - 11/12/16 0908    Clinical Impression Statement Patient is a 66 year old male who presents with low back pain that increases when he stands or walks at work. He also does a large amount of lifting at his job. Signs and symptoms are consitent with lumbar stenosis. He has some tightness in his hips and weakness in his core. He would benefit from skilled therapy to improve his core strength and lifting technique.    Clinical Presentation Stable   Clinical Decision Making Low   Rehab Potential Good   PT Frequency 2x / week   PT Duration 8 weeks   PT Treatment/Interventions ADLs/Self Care Home Management;Cryotherapy;Electrical Stimulation;Gait training;Stair training;Moist Heat;Therapeutic exercise;Therapeutic activities;Neuromuscular re-education;Patient/family education;Passive range of motion;Manual techniques;Splinting;Taping   PT Next Visit Plan review lifting technique and squat technique for work; Consider upper extremity core strengthening exercises to work on stabilization while working; Review HEP   PT Home Exercise Plan sink stretch;  pririformis stretch; hamstring stretch; bridge, straight leg raise; supine clamshell with abdominal breathing    Consulted and Agree with Plan of Care Patient      Patient will benefit from skilled therapeutic intervention in order to improve the following deficits and impairments:  Decreased strength, Pain, Decreased activity tolerance, Increased fascial restricitons, Decreased range of motion, Decreased safety awareness, Improper body mechanics  Visit Diagnosis: Chronic bilateral low back pain without sciatica - Plan: PT plan of care cert/re-cert  Muscle weakness (generalized) - Plan: PT plan of care cert/re-cert  Muscle spasm of back - Plan: PT plan of care cert/re-cert     Problem List Patient Active Problem List   Diagnosis Date Noted  . Episode of dizziness 10/24/2016  . Vitamin D insufficiency 08/03/2016  . Cervical disc disease 07/03/2016  . Type 2 diabetes mellitus with stage 3 chronic kidney disease, without long-term current use of insulin ([place]) 02/10/2015  . Cough 07/06/2013  . Other emphysema ([place]) 05/22/2012  . Constipation 05/22/2012  . Sensation problem 05/22/2012  . Healthcare maintenance 03/28/2012  . LUMBAR STRAIN 07/15/2008  . Hyperlipidemia 03/22/2006  . TOBACCO ABUSE 03/22/2006  . Essential hypertension 03/22/2006  . GERD 03/22/2006  . Chronic kidney disease 03/21/2006    Carney Living PT DPT  11/12/2016, 9:50 AM  [place], [place], [address] Phone: [phone]   Fax:  [phone]  Name: William Perkins MRN: [id number] Date of Birth: 1950/06/02

## 2016-11-15 ENCOUNTER — Encounter: Payer: Self-pay | Admitting: Internal Medicine

## 2016-11-15 ENCOUNTER — Ambulatory Visit (INDEPENDENT_AMBULATORY_CARE_PROVIDER_SITE_OTHER): Payer: BLUE CROSS/BLUE SHIELD | Admitting: Internal Medicine

## 2016-11-15 DIAGNOSIS — Z0000 Encounter for general adult medical examination without abnormal findings: Secondary | ICD-10-CM

## 2016-11-15 DIAGNOSIS — N183 Chronic kidney disease, stage 3 unspecified: Secondary | ICD-10-CM

## 2016-11-15 DIAGNOSIS — N189 Chronic kidney disease, unspecified: Secondary | ICD-10-CM | POA: Diagnosis not present

## 2016-11-15 DIAGNOSIS — M549 Dorsalgia, unspecified: Secondary | ICD-10-CM

## 2016-11-15 DIAGNOSIS — Z79899 Other long term (current) drug therapy: Secondary | ICD-10-CM | POA: Diagnosis not present

## 2016-11-15 DIAGNOSIS — I129 Hypertensive chronic kidney disease with stage 1 through stage 4 chronic kidney disease, or unspecified chronic kidney disease: Secondary | ICD-10-CM

## 2016-11-15 DIAGNOSIS — I10 Essential (primary) hypertension: Secondary | ICD-10-CM

## 2016-11-15 MED ORDER — HYDROCHLOROTHIAZIDE 12.5 MG PO CAPS: 12.5000 mg | ORAL_CAPSULE | Freq: Every day | ORAL | 2 refills | Status: DC

## 2016-11-15 NOTE — Patient Instructions (Signed)
Thank you for coming to see me today. It was a pleasure. Today we talked about:   Blood pressure: Continue your previous medications as before but STOP taking Lasix 20mg  daily.  Instead, START taking HCTZ 12.5mg  daily.  STOP taking gabapentin  Please follow-up with me in 6 weeks  If you have any questions or concerns, please do not hesitate to call the office at [phone].  Take Care,   Jule Ser, DO

## 2016-11-15 NOTE — Progress Notes (Signed)
   CC: here for HTN follow up  HPI:  [name] is a 66 y.o. man with a past medical history listed below here today for follow up of his HTN.   For details of today's visit and the status of his chronic medical issues please refer to the assessment and plan.   Past Medical History:  Diagnosis Date  . Chronic renal insufficiency 03/21/2006   CrCl <50 ml  . DDD (degenerative disc disease), cervical   . GERD (gastroesophageal reflux disease) 03/22/2006  . Hand numbness 10/07/2009  . Hyperlipidemia 03/22/2008  . Hypertension   . Hypertension 03/22/2006  . Left ventricular hypertrophy   . Left ventricular hypertrophy 03/22/2006  . Leg pain, left 11/15/2009  . Lumbar strain 07/15/2008  . Renal insufficiency   . Soft tissue disorder 08/2008  . Tobacco abuse 03/22/2006   2 ppd x 30 years   Review of Systems: Review of Systems  Constitutional: Negative for chills and fever.  Respiratory: Negative for shortness of breath.   Cardiovascular: Negative for chest pain.  Musculoskeletal: Positive for back pain.   Physical Exam:  Vitals:   11/15/16 1452 11/15/16 1524  BP: (!) 153/64 (!) 155/73  Pulse: 60 (!) 50  Temp: 97.9 F (36.6 C)   TempSrc: Oral   SpO2: 99%   Weight: 153 lb 14.4 oz (69.8 kg)   Height: 5\' 11"  (1.803 m)    Physical Exam  Constitutional: He is oriented to person, place, and time and well-developed, well-nourished, and in no distress.  HENT:  Head: Normocephalic and atraumatic.  Pulmonary/Chest: Effort normal.  Neurological: He is alert and oriented to person, place, and time.  Psychiatric: Mood and affect normal.    Assessment & Plan:   See Encounters Tab for problem based charting.  Patient discussed with Dr. Lynnae January.  Essential hypertension Assessment: BP elevated today on 3 different readings of SBP > 150.  Reports adherence to his medication regimen.  Reports taking BP every other day at home with average readings in the 130s. Did not bring  this BP log to clinic today.  He is agreeable to further intervention to get his BP under better control.  Plan: -Continue amlodipine 10 mg QD -Continue lisinopril 40 mg QD -Continue hydralazine 50 mg BID -Continue clonidine 0.1 mg BID - START HCTZ 12.5mg  daily - STOP taking Lasix 20mg  daily as this is likely not providing adequate BP benefit, he has no history of CHF, and appears euvolemic on exam. - Follow up 6 weeks.  Repeat BMET at that time to ensure stable renal function.   Chronic kidney disease Assessment/Plan: Stable CKD and reports he has appointment with Dr Posey Pronto on Oct 12.  Healthcare maintenance A/P: Patient declines flu and/or pneumonia vaccine today.

## 2016-11-15 NOTE — Progress Notes (Signed)
Internal Medicine Clinic Attending  Case discussed with Dr. Wallace at the time of the visit.  We reviewed the resident's history and exam and pertinent patient test results.  I agree with the assessment, diagnosis, and plan of care documented in the resident's note.  

## 2016-11-15 NOTE — Assessment & Plan Note (Signed)
A/P: Patient declines flu and/or pneumonia vaccine today.

## 2016-11-15 NOTE — Assessment & Plan Note (Signed)
Assessment/Plan: Stable CKD and reports he has appointment with Dr Posey Pronto on Oct 12.

## 2016-11-15 NOTE — Assessment & Plan Note (Signed)
Assessment: BP elevated today on 3 different readings of SBP > 150.  Reports adherence to his medication regimen.  Reports taking BP every other day at home with average readings in the 130s. Did not bring this BP log to clinic today.  He is agreeable to further intervention to get his BP under better control.  Plan: -Continue amlodipine 10 mg QD -Continue lisinopril 40 mg QD -Continue hydralazine 50 mg BID -Continue clonidine 0.1 mg BID - START HCTZ 12.5mg  daily - STOP taking Lasix 20mg  daily as this is likely not providing adequate BP benefit, he has no history of CHF, and appears euvolemic on exam. - Follow up 6 weeks.  Repeat BMET at that time to ensure stable renal function.

## 2016-11-23 ENCOUNTER — Ambulatory Visit: Payer: BLUE CROSS/BLUE SHIELD | Admitting: Physical Therapy

## 2016-11-23 ENCOUNTER — Encounter: Payer: Self-pay | Admitting: Physical Therapy

## 2016-11-23 DIAGNOSIS — M545 Low back pain: Principal | ICD-10-CM

## 2016-11-23 DIAGNOSIS — M62830 Muscle spasm of back: Secondary | ICD-10-CM | POA: Diagnosis not present

## 2016-11-23 DIAGNOSIS — G8929 Other chronic pain: Secondary | ICD-10-CM | POA: Diagnosis not present

## 2016-11-23 DIAGNOSIS — M6281 Muscle weakness (generalized): Secondary | ICD-10-CM

## 2016-11-23 NOTE — Therapy (Signed)
[place], [place], [address] Phone: [phone]   Fax:  [phone]  Physical Therapy Treatment  Patient Details  Name: William Perkins MRN: [id number] Date of Birth: September 02, 1950 Referring Provider: Jule Ser DO   Encounter Date: 11/23/2016      PT End of Session - 11/23/16 1101    Visit Number 2   Number of Visits 16   Date for PT Re-Evaluation 01/07/17   Authorization Type BCBS    PT Start Time 1101   PT Stop Time 1142   PT Time Calculation (min) 41 min   Activity Tolerance Patient tolerated treatment well   Behavior During Therapy [facility] for tasks assessed/performed      Past Medical History:  Diagnosis Date  . Chronic renal insufficiency 03/21/2006   CrCl <50 ml  . DDD (degenerative disc disease), cervical   . GERD (gastroesophageal reflux disease) 03/22/2006  . Hand numbness 10/07/2009  . Hyperlipidemia 03/22/2008  . Hypertension   . Hypertension 03/22/2006  . Left ventricular hypertrophy   . Left ventricular hypertrophy 03/22/2006  . Leg pain, left 11/15/2009  . Lumbar strain 07/15/2008  . Renal insufficiency   . Soft tissue disorder 08/2008  . Tobacco abuse 03/22/2006   2 ppd x 30 years    History reviewed. No pertinent surgical history.  There were no vitals filed for this visit.      Subjective Assessment - 11/23/16 1102    Subjective Back is feeling pretty good today.    Patient Stated Goals Less pain when standing and walking    Currently in Pain? No/denies                         [facility] Adult PT Treatment/Exercise - 11/23/16 0001      Exercises   Exercises Lumbar     Lumbar Exercises: Stretches   Passive Hamstring Stretch 2 reps;30 seconds  both   Passive Hamstring Stretch Limitations supine with green strap   Single Knee to Chest Stretch Limitations both    Piriformis Stretch Limitations supine figure 4     Lumbar Exercises: Aerobic   Stationary Bike nu step 5 min L6 UE & LE     Lumbar Exercises: Standing   Functional Squats Limitations green band under feet     Lumbar Exercises: Seated   Other Seated Lumbar Exercises seated pelvic tilt with 4lb overhead lift     Lumbar Exercises: Supine   AB Set Limitations abdominal engagement with breathing   Dead Bug 10 reps;5 seconds   Bridge Limitations x15 cues for core & pelvic dissociation   Large Ball Abdominal Isometric 10 reps;5 seconds   Other Supine Lumbar Exercises 4lb weight in both hands, GHJ flexion with abdominal engagement                  PT Short Term Goals - 11/12/16 3007      PT SHORT TERM GOAL #1   Title Patient will demsotrate a good core contraction    Time 4   Period Weeks   Status New     PT SHORT TERM GOAL #2   Title Patient will demonstrate 5/5 gross bilateral lower extremity strength    Time 4   Period Weeks   Status New     PT SHORT TERM GOAL #3   Title Patient will be independent with basic HEP    Time 4   Period Weeks   Status  New           PT Long Term Goals - 11/12/16 2536      PT LONG TERM GOAL #1   Title Patient will go up/down 8 steps without increased pain    Time 8   Period Weeks   Status New     PT LONG TERM GOAL #2   Title Patient will walk 1 mile without self reported pain in order to perfrom work tasks    Time 8   Period Weeks   Status New     PT LONG TERM GOAL #3   Title Patient will stand for 1 hour at work without increased lower back pain    Time 8   Period Weeks   Status New               Plan - 11/23/16 1142    Clinical Impression Statement Pt required cues to slow down exercises and to breathe. Was trying to engage abdominals by inhaling and holding his breath. Cues for engagement of core/gluts in standing squat to mimick lifting squat. Pt reports "it feels pretty good" regarding back after treatment.    PT Treatment/Interventions ADLs/Self Care Home  Management;Cryotherapy;Electrical Stimulation;Gait training;Stair training;Moist Heat;Therapeutic exercise;Therapeutic activities;Neuromuscular re-education;Patient/family education;Passive range of motion;Manual techniques;Splinting;Taping   PT Next Visit Plan review lifting technique and squat technique for work; Consider upper extremity core strengthening exercises to work on stabilization while working; Review HEP   PT Home Exercise Plan sink stretch; pririformis stretch; hamstring stretch; bridge, straight leg raise; supine clamshell with abdominal breathing. abdominal isometric press, dead bug   Consulted and Agree with Plan of Care Patient      Patient will benefit from skilled therapeutic intervention in order to improve the following deficits and impairments:  Decreased strength, Pain, Decreased activity tolerance, Increased fascial restricitons, Decreased range of motion, Decreased safety awareness, Improper body mechanics  Visit Diagnosis: Chronic bilateral low back pain without sciatica  Muscle weakness (generalized)  Muscle spasm of back     Problem List Patient Active Problem List   Diagnosis Date Noted  . Episode of dizziness 10/24/2016  . Vitamin D insufficiency 08/03/2016  . Cervical disc disease 07/03/2016  . Type 2 diabetes mellitus with stage 3 chronic kidney disease, without long-term current use of insulin ([place]) 02/10/2015  . Cough 07/06/2013  . Other emphysema ([place]) 05/22/2012  . Constipation 05/22/2012  . Sensation problem 05/22/2012  . Healthcare maintenance 03/28/2012  . LUMBAR STRAIN 07/15/2008  . Hyperlipidemia 03/22/2006  . TOBACCO ABUSE 03/22/2006  . Essential hypertension 03/22/2006  . GERD 03/22/2006  . Chronic kidney disease 03/21/2006    Shuna Tabor C. Niambi Smoak PT, DPT 11/23/16 11:45 AM   [facility] [address] [place], [place], [address] Phone: [phone]   Fax:  [phone]  Name:  Ethanael Veith MRN: [id number] Date of Birth: 1951-01-23

## 2016-11-29 ENCOUNTER — Other Ambulatory Visit: Payer: Self-pay | Admitting: Internal Medicine

## 2016-11-29 DIAGNOSIS — E785 Hyperlipidemia, unspecified: Secondary | ICD-10-CM

## 2016-11-29 DIAGNOSIS — I10 Essential (primary) hypertension: Secondary | ICD-10-CM

## 2016-11-30 ENCOUNTER — Ambulatory Visit: Payer: BLUE CROSS/BLUE SHIELD | Admitting: Physical Therapy

## 2016-11-30 DIAGNOSIS — M62830 Muscle spasm of back: Secondary | ICD-10-CM | POA: Diagnosis not present

## 2016-11-30 DIAGNOSIS — M6281 Muscle weakness (generalized): Secondary | ICD-10-CM | POA: Diagnosis not present

## 2016-11-30 DIAGNOSIS — G8929 Other chronic pain: Secondary | ICD-10-CM | POA: Diagnosis not present

## 2016-11-30 DIAGNOSIS — M545 Low back pain: Principal | ICD-10-CM

## 2016-11-30 NOTE — Therapy (Signed)
[place], [place], [address] Phone: [phone]   Fax:  [phone]  Physical Therapy Treatment  Patient Details  Name: William Perkins MRN: [id number] Date of Birth: 04-08-1950 Referring Provider: Jule Ser DO   Encounter Date: 11/30/2016      PT End of Session - 11/30/16 0805    Visit Number 3   Number of Visits 16   Date for PT Re-Evaluation 01/07/17   Authorization Type BCBS    PT Start Time 0800   PT Stop Time 0840   PT Time Calculation (min) 40 min   Activity Tolerance Patient tolerated treatment well   Behavior During Therapy [facility] for tasks assessed/performed      Past Medical History:  Diagnosis Date  . Chronic renal insufficiency 03/21/2006   CrCl <50 ml  . DDD (degenerative disc disease), cervical   . GERD (gastroesophageal reflux disease) 03/22/2006  . Hand numbness 10/07/2009  . Hyperlipidemia 03/22/2008  . Hypertension   . Hypertension 03/22/2006  . Left ventricular hypertrophy   . Left ventricular hypertrophy 03/22/2006  . Leg pain, left 11/15/2009  . Lumbar strain 07/15/2008  . Renal insufficiency   . Soft tissue disorder 08/2008  . Tobacco abuse 03/22/2006   2 ppd x 30 years    No past surgical history on file.  There were no vitals filed for this visit.      Subjective Assessment - 11/30/16 0803    Subjective Patient feels like he is able to work a little further at work. He feels like it is a little better. He is having no pain this morning. He has been doing his stretches and exercises.    Limitations Standing;Walking   How long can you stand comfortably? Has to stand up at work which increases his pain    How long can you walk comfortably? walks at work all day but has pain    Diagnostic tests Nothing recent for the lower back    Patient Stated Goals Less pain when standing and walking    Currently in Pain? No/denies                          [facility] Adult PT Treatment/Exercise - 11/30/16 0001      Self-Care   Self-Care Lifting   Lifting reviewed lifting technique from table top handles 5 lbs in box 4x mod to max cuing; 3x to lower handles mod to max cuing; quats at the sink for lifting technique 2x5 mod to max cuing. Patient advised to continue practicing before perfroming at work.      Lumbar Exercises: Stretches   Passive Hamstring Stretch 2 reps;30 seconds  both   Passive Hamstring Stretch Limitations supine with green strap   Piriformis Stretch Limitations supine 2x20 sec hold      Lumbar Exercises: Aerobic   Stationary Bike nu step 5 min L6 UE & LE     Lumbar Exercises: Standing   Functional Squats Limitations reviewed at the sink    Other Standing Lumbar Exercises standing shoulder extensions 2x10 red; scpa retractions 2x10 red      Lumbar Exercises: Supine   Clam Limitations x10 red band with abdominal breathing    Bridge Limitations 2x15 sec hold      Manual Therapy   Manual therapy comments IASTYM to lumbar paraspianls 2nd to reports of tightness after lifting training.  PT Education - 11/30/16 0804    Education provided Yes   Education Details reviewed exercises and technique    Person(s) Educated Patient   Methods Explanation;Demonstration;Tactile cues;Verbal cues   Comprehension Verbalized understanding;Returned demonstration;Verbal cues required;Tactile cues required          PT Short Term Goals - 11/30/16 1215      PT SHORT TERM GOAL #1   Title Patient will demsotrate a good core contraction    Time 4   Period Weeks   Status On-going     PT SHORT TERM GOAL #2   Title Patient will demonstrate 5/5 gross bilateral lower extremity strength    Time 4   Period Weeks   Status On-going     PT SHORT TERM GOAL #3   Title Patient will be independent with basic HEP    Time 4   Period Weeks   Status On-going           PT Long Term Goals - 11/12/16 0175      PT LONG  TERM GOAL #1   Title Patient will go up/down 8 steps without increased pain    Time 8   Period Weeks   Status New     PT LONG TERM GOAL #2   Title Patient will walk 1 mile without self reported pain in order to perfrom work tasks    Time 8   Period Weeks   Status New     PT LONG TERM GOAL #3   Title Patient will stand for 1 hour at work without increased lower back pain    Time 8   Period Weeks   Status New               Plan - 11/30/16 1025    Clinical Impression Statement Patient is making progress. He has not had as much pain as work. He had no increase in pain with treatment. He will continue to progrss exercises as tolerated. Therapy reviewed lifting technique this visit.    Clinical Presentation Stable   Clinical Decision Making Low   Rehab Potential Good   PT Frequency 2x / week   PT Duration 8 weeks   PT Treatment/Interventions ADLs/Self Care Home Management;Cryotherapy;Electrical Stimulation;Gait training;Stair training;Moist Heat;Therapeutic exercise;Therapeutic activities;Neuromuscular re-education;Patient/family education;Passive range of motion;Manual techniques;Splinting;Taping   PT Next Visit Plan review lifting technique and squat technique for work; Consider upper extremity core strengthening exercises to work on stabilization while working; Review HEP   PT Home Exercise Plan sink stretch; pririformis stretch; hamstring stretch; bridge, straight leg raise; supine clamshell with abdominal breathing. abdominal isometric press, dead bug   Consulted and Agree with Plan of Care Patient      Patient will benefit from skilled therapeutic intervention in order to improve the following deficits and impairments:  Decreased strength, Pain, Decreased activity tolerance, Increased fascial restricitons, Decreased range of motion, Decreased safety awareness, Improper body mechanics  Visit Diagnosis: Chronic bilateral low back pain without sciatica  Muscle weakness  (generalized)  Muscle spasm of back     Problem List Patient Active Problem List   Diagnosis Date Noted  . Episode of dizziness 10/24/2016  . Vitamin D insufficiency 08/03/2016  . Cervical disc disease 07/03/2016  . Type 2 diabetes mellitus with stage 3 chronic kidney disease, without long-term current use of insulin ([place]) 02/10/2015  . Cough 07/06/2013  . Other emphysema ([place]) 05/22/2012  . Constipation 05/22/2012  . Sensation problem 05/22/2012  . Healthcare maintenance 03/28/2012  .  LUMBAR STRAIN 07/15/2008  . Hyperlipidemia 03/22/2006  . TOBACCO ABUSE 03/22/2006  . Essential hypertension 03/22/2006  . GERD 03/22/2006  . Chronic kidney disease 03/21/2006    Carney Living PT DPT  11/30/2016, 12:18 PM  [facility] [address] [place], [place], [address] Phone: [phone]   Fax:  [phone]  Name: William Perkins MRN: [id number] Date of Birth: February 14, 1951

## 2016-12-09 ENCOUNTER — Other Ambulatory Visit: Payer: Self-pay | Admitting: Internal Medicine

## 2016-12-09 DIAGNOSIS — I10 Essential (primary) hypertension: Secondary | ICD-10-CM

## 2016-12-14 ENCOUNTER — Encounter: Payer: Self-pay | Admitting: Physical Therapy

## 2016-12-14 ENCOUNTER — Ambulatory Visit: Payer: BLUE CROSS/BLUE SHIELD | Attending: Internal Medicine | Admitting: Physical Therapy

## 2016-12-14 DIAGNOSIS — N183 Chronic kidney disease, stage 3 (moderate): Secondary | ICD-10-CM | POA: Diagnosis not present

## 2016-12-14 DIAGNOSIS — G8929 Other chronic pain: Secondary | ICD-10-CM

## 2016-12-14 DIAGNOSIS — D631 Anemia in chronic kidney disease: Secondary | ICD-10-CM | POA: Diagnosis not present

## 2016-12-14 DIAGNOSIS — M62830 Muscle spasm of back: Secondary | ICD-10-CM | POA: Diagnosis not present

## 2016-12-14 DIAGNOSIS — E559 Vitamin D deficiency, unspecified: Secondary | ICD-10-CM | POA: Diagnosis not present

## 2016-12-14 DIAGNOSIS — M545 Low back pain: Secondary | ICD-10-CM | POA: Diagnosis not present

## 2016-12-14 DIAGNOSIS — M6281 Muscle weakness (generalized): Secondary | ICD-10-CM | POA: Diagnosis not present

## 2016-12-14 DIAGNOSIS — I129 Hypertensive chronic kidney disease with stage 1 through stage 4 chronic kidney disease, or unspecified chronic kidney disease: Secondary | ICD-10-CM | POA: Diagnosis not present

## 2016-12-14 NOTE — Therapy (Signed)
[place], [place], [address] Phone: [phone]   Fax:  [phone]  Physical Therapy Treatment  Patient Details  Name: William Perkins MRN: [id number] Date of Birth: 1950-04-22 Referring Provider: Jule Ser DO   Encounter Date: 12/14/2016      PT End of Session - 12/14/16 0815    Visit Number 4   Number of Visits 16   Date for PT Re-Evaluation 01/07/17   Authorization Type BCBS    PT Start Time 0800   PT Stop Time 0841   PT Time Calculation (min) 41 min   Activity Tolerance Patient tolerated treatment well   Behavior During Therapy [facility] for tasks assessed/performed      Past Medical History:  Diagnosis Date  . Chronic renal insufficiency 03/21/2006   CrCl <50 ml  . DDD (degenerative disc disease), cervical   . GERD (gastroesophageal reflux disease) 03/22/2006  . Hand numbness 10/07/2009  . Hyperlipidemia 03/22/2008  . Hypertension   . Hypertension 03/22/2006  . Left ventricular hypertrophy   . Left ventricular hypertrophy 03/22/2006  . Leg pain, left 11/15/2009  . Lumbar strain 07/15/2008  . Renal insufficiency   . Soft tissue disorder 08/2008  . Tobacco abuse 03/22/2006   2 ppd x 30 years    History reviewed. No pertinent surgical history.  There were no vitals filed for this visit.      Subjective Assessment - 12/14/16 0808    Subjective Patient reports no significant pain today. He has been doing better at work. He is getting cramps. he feels like they came around the time he stated a new medicine.    Limitations Standing;Walking   How long can you stand comfortably? Has to stand up at work which increases his pain    Currently in Pain? No/denies                         [facility] Adult PT Treatment/Exercise - 12/14/16 0001      Lumbar Exercises: Stretches   Passive Hamstring Stretch 2 reps;30 seconds  both   Passive Hamstring Stretch Limitations supine with  green strap   Piriformis Stretch Limitations supine 2x20 sec hold      Lumbar Exercises: Aerobic   Stationary Bike nu step 5 min L6 UE & LE     Lumbar Exercises: Standing   Functional Squats Limitations reviewed at the sink      Lumbar Exercises: Supine   Clam Limitations 2x10 green band    Bridge Limitations 2x10    Straight Leg Raises Limitations 2x10    Other Supine Lumbar Exercises standing chop x10 each way; pallof press x10 mod cuing for technique; forward flexion green band x10 mod cuing for technique.      Manual Therapy   Manual therapy comments IASTYM to lumbar paraspianls 2nd to reports of tightness after lifting training.PA mobilization of lower lumbar spine grade 1 and 2                  PT Education - 12/14/16 0815    Education provided Yes   Education Details reviewed higher level strengthening exercises    Person(s) Educated Patient   Methods Explanation;Demonstration;Verbal cues;Tactile cues   Comprehension Verbalized understanding;Returned demonstration;Verbal cues required;Tactile cues required          PT Short Term Goals - 12/14/16 0817      PT SHORT TERM GOAL #1   Title Patient will demsotrate a  good core contraction    Baseline demostrating good core contraction without cuing    Time 4   Period Weeks   Status Achieved     PT SHORT TERM GOAL #2   Title Patient will demonstrate 5/5 gross bilateral lower extremity strength    Baseline improved strength to 5/5 bilateral    Time 4   Period Weeks   Status Achieved     PT SHORT TERM GOAL #3   Title Patient will be independent with basic HEP    Baseline perfromign exercises at home. No cuing required in the clinic    Time 4   Period Weeks   Status Achieved           PT Long Term Goals - 11/12/16 3154      PT LONG TERM GOAL #1   Title Patient will go up/down 8 steps without increased pain    Time 8   Period Weeks   Status New     PT LONG TERM GOAL #2   Title Patient will walk 1  mile without self reported pain in order to perfrom work tasks    Time 8   Period Weeks   Status New     PT LONG TERM GOAL #3   Title Patient will stand for 1 hour at work without increased lower back pain    Time 8   Period Weeks   Status New               Plan - 12/14/16 0815    Clinical Impression Statement Patient continues to make progres. therapy added in ther-ex for pushing and pulling activity. he had no increase in pain. patient will likley discharge next visit. He is approaching all goals for therapy., See below for goal specific progress.    Clinical Presentation Stable   Clinical Decision Making Low   Rehab Potential Good   PT Frequency 2x / week   PT Duration 8 weeks   PT Treatment/Interventions ADLs/Self Care Home Management;Cryotherapy;Electrical Stimulation;Gait training;Stair training;Moist Heat;Therapeutic exercise;Therapeutic activities;Neuromuscular re-education;Patient/family education;Passive range of motion;Manual techniques;Splinting;Taping   PT Next Visit Plan review lifting technique and squat technique for work; Consider upper extremity core strengthening exercises to work on stabilization while working; Review HEP   PT Home Exercise Plan sink stretch; pririformis stretch; hamstring stretch; bridge, straight leg raise; supine clamshell with abdominal breathing. abdominal isometric press, dead bug   Consulted and Agree with Plan of Care Patient      Patient will benefit from skilled therapeutic intervention in order to improve the following deficits and impairments:  Decreased strength, Pain, Decreased activity tolerance, Increased fascial restricitons, Decreased range of motion, Decreased safety awareness, Improper body mechanics  Visit Diagnosis: Chronic bilateral low back pain without sciatica  Muscle weakness (generalized)  Muscle spasm of back     Problem List Patient Active Problem List   Diagnosis Date Noted  . Episode of dizziness  10/24/2016  . Vitamin D insufficiency 08/03/2016  . Cervical disc disease 07/03/2016  . Type 2 diabetes mellitus with stage 3 chronic kidney disease, without long-term current use of insulin ([place]) 02/10/2015  . Cough 07/06/2013  . Other emphysema ([place]) 05/22/2012  . Constipation 05/22/2012  . Sensation problem 05/22/2012  . Healthcare maintenance 03/28/2012  . LUMBAR STRAIN 07/15/2008  . Hyperlipidemia 03/22/2006  . TOBACCO ABUSE 03/22/2006  . Essential hypertension 03/22/2006  . GERD 03/22/2006  . Chronic kidney disease 03/21/2006    Carney Living PT DPT  12/14/2016,  8:46 AM  [place], [place], [address] Phone: [phone]   Fax:  [phone]  Name: Cheyne Boulden MRN: [id number] Date of Birth: 08/28/1950

## 2016-12-21 ENCOUNTER — Ambulatory Visit: Payer: BLUE CROSS/BLUE SHIELD | Attending: Internal Medicine | Admitting: Physical Therapy

## 2016-12-21 DIAGNOSIS — G8929 Other chronic pain: Secondary | ICD-10-CM

## 2016-12-21 DIAGNOSIS — M545 Low back pain: Secondary | ICD-10-CM | POA: Diagnosis not present

## 2016-12-21 DIAGNOSIS — M62830 Muscle spasm of back: Secondary | ICD-10-CM | POA: Insufficient documentation

## 2016-12-21 DIAGNOSIS — M6281 Muscle weakness (generalized): Secondary | ICD-10-CM

## 2016-12-21 NOTE — Therapy (Signed)
[place], [place], [address] Phone: [phone]   Fax:  [phone]  Physical Therapy Treatment/ Discharge   Patient Details  Name: William Perkins MRN: [id number] Date of Birth: 19-Apr-1950 Referring Provider: Jule Ser DO   Encounter Date: 12/21/2016      PT End of Session - 12/21/16 0806    Visit Number 5   Number of Visits 16   Date for PT Re-Evaluation 01/07/17   Authorization Type [employer]    PT Start Time 0800   PT Stop Time 0840   PT Time Calculation (min) 40 min   Activity Tolerance Patient tolerated treatment well   Behavior During Therapy [facility] for tasks assessed/performed      Past Medical History:  Diagnosis Date  . Chronic renal insufficiency 03/21/2006   CrCl <50 ml  . DDD (degenerative disc disease), cervical   . GERD (gastroesophageal reflux disease) 03/22/2006  . Hand numbness 10/07/2009  . Hyperlipidemia 03/22/2008  . Hypertension   . Hypertension 03/22/2006  . Left ventricular hypertrophy   . Left ventricular hypertrophy 03/22/2006  . Leg pain, left 11/15/2009  . Lumbar strain 07/15/2008  . Renal insufficiency   . Soft tissue disorder 08/2008  . Tobacco abuse 03/22/2006   2 ppd x 30 years    No past surgical history on file.  There were no vitals filed for this visit.      Subjective Assessment - 12/21/16 0805    Subjective Patient reports no pain at this time. He is having very little pain in his lower back at work. He feels like he is ready for discharge.    Limitations Standing;Walking   How long can you stand comfortably? Has to stand up at work which increases his pain    How long can you walk comfortably? walks at work all day but has pain    Diagnostic tests Nothing recent for the lower back    Patient Stated Goals Less pain when standing and walking    Currently in Pain? No/denies                         [facility] Adult PT Treatment/Exercise -  12/21/16 0001      Self-Care   Self-Care Lifting   Lifting Reviewed lifting 20lb box from low table. Max cuing required to use his legs and not use his back.      Lumbar Exercises: Stretches   Passive Hamstring Stretch 2 reps;30 seconds  both   Passive Hamstring Stretch Limitations supine with green strap   Piriformis Stretch Limitations supine 2x20 sec hold      Lumbar Exercises: Aerobic   Stationary Bike nu step 5 min L6 UE & LE     Lumbar Exercises: Supine   Clam Limitations 2x10 green band    Bridge Limitations 2x10    Straight Leg Raises Limitations 2x10    Other Supine Lumbar Exercises standing chop x10 each way; pallof press x10 mod cuing for technique; forward flexion green band x10 mod cuing for technique.                 PT Education - 12/21/16 0806    Education provided Yes   Education Details Reviewed final HEP    Person(s) Educated Patient   Methods Explanation;Demonstration;Tactile cues   Comprehension Verbalized understanding;Returned demonstration;Verbal cues required;Tactile cues required          PT Short Term Goals - 12/21/16 6063  PT SHORT TERM GOAL #1   Title Patient will demsotrate a good core contraction    Baseline demostrating good core contraction without cuing    Time 4   Period Weeks   Status Achieved     PT SHORT TERM GOAL #2   Title Patient will demonstrate 5/5 gross bilateral lower extremity strength    Baseline improved strength to 5/5 bilateral    Time 4   Period Weeks   Status Achieved     PT SHORT TERM GOAL #3   Title Patient will be independent with basic HEP    Baseline perfromign exercises at home. No cuing required in the clinic    Time 4   Period Weeks   Status Achieved           PT Long Term Goals - 12/21/16 3151      PT LONG TERM GOAL #1   Title Patient will go up/down 8 steps without increased pain    Baseline No pain with steps at work   Time 8   Period Weeks   Status Achieved     PT LONG  TERM GOAL #2   Title Patient will walk 1 mile without self reported pain in order to perfrom work tasks    Baseline can walk long distancres at work without pain    Time 8   Period Weeks   Status Achieved     PT LONG TERM GOAL #3   Title Patient will stand for 1 hour at work without increased lower back pain    Time 8   Status Achieved               Plan - 12/21/16 0806    Clinical Impression Statement Patient has made great progress. He has reached all goals for therapy. He feels confortable with D/C to HEP. See below for goal specific progress. Therapy spent time reviewing lifting technique with the patient. he required max cuing.    Clinical Presentation Stable   Clinical Decision Making Low   Rehab Potential Good   PT Frequency 2x / week   PT Duration 8 weeks   PT Treatment/Interventions ADLs/Self Care Home Management;Cryotherapy;Electrical Stimulation;Gait training;Stair training;Moist Heat;Therapeutic exercise;Therapeutic activities;Neuromuscular re-education;Patient/family education;Passive range of motion;Manual techniques;Splinting;Taping   PT Next Visit Plan review lifting technique and squat technique for work; Consider upper extremity core strengthening exercises to work on stabilization while working; Review HEP   PT Home Exercise Plan sink stretch; pririformis stretch; hamstring stretch; bridge, straight leg raise; supine clamshell with abdominal breathing. abdominal isometric press, dead bug   Consulted and Agree with Plan of Care Patient      Patient will benefit from skilled therapeutic intervention in order to improve the following deficits and impairments:  Decreased strength, Pain, Decreased activity tolerance, Increased fascial restricitons, Decreased range of motion, Decreased safety awareness, Improper body mechanics  Visit Diagnosis: Chronic bilateral low back pain without sciatica  Muscle weakness (generalized)  Muscle spasm of back  PHYSICAL  THERAPY DISCHARGE SUMMARY Visits: 5  Current functional level related to goals / functional outcomes: Improved pain with all function    Remaining deficits: Still pain with lifting    Education / Equipment: HEP  Plan: Patient agrees to discharge.  Patient goals were not met. Patient is being discharged due to meeting the stated rehab goals.  ?????       Problem List Patient Active Problem List   Diagnosis Date Noted  . Episode of dizziness 10/24/2016  .  Vitamin D insufficiency 08/03/2016  . Cervical disc disease 07/03/2016  . Type 2 diabetes mellitus with stage 3 chronic kidney disease, without long-term current use of insulin ([place]) 02/10/2015  . Cough 07/06/2013  . Other emphysema ([place]) 05/22/2012  . Constipation 05/22/2012  . Sensation problem 05/22/2012  . Healthcare maintenance 03/28/2012  . LUMBAR STRAIN 07/15/2008  . Hyperlipidemia 03/22/2006  . TOBACCO ABUSE 03/22/2006  . Essential hypertension 03/22/2006  . GERD 03/22/2006  . Chronic kidney disease 03/21/2006    Carney Living PT DPT  12/21/2016, 8:50 AM  [facility] [address] [place], [place], [address] Phone: [phone]   Fax:  [phone]  Name: Deaveon Schoen MRN: [id number] Date of Birth: 1950/05/15

## 2017-01-03 ENCOUNTER — Ambulatory Visit (INDEPENDENT_AMBULATORY_CARE_PROVIDER_SITE_OTHER): Payer: BLUE CROSS/BLUE SHIELD | Admitting: Internal Medicine

## 2017-01-03 ENCOUNTER — Encounter: Payer: Self-pay | Admitting: Internal Medicine

## 2017-01-03 VITALS — BP 151/70 | HR 58 | Temp 97.7°F | Ht 71.0 in | Wt 156.8 lb

## 2017-01-03 DIAGNOSIS — I129 Hypertensive chronic kidney disease with stage 1 through stage 4 chronic kidney disease, or unspecified chronic kidney disease: Secondary | ICD-10-CM

## 2017-01-03 DIAGNOSIS — Z79899 Other long term (current) drug therapy: Secondary | ICD-10-CM

## 2017-01-03 DIAGNOSIS — E1122 Type 2 diabetes mellitus with diabetic chronic kidney disease: Secondary | ICD-10-CM | POA: Diagnosis not present

## 2017-01-03 DIAGNOSIS — N183 Chronic kidney disease, stage 3 unspecified: Secondary | ICD-10-CM

## 2017-01-03 DIAGNOSIS — I10 Essential (primary) hypertension: Secondary | ICD-10-CM

## 2017-01-03 DIAGNOSIS — E559 Vitamin D deficiency, unspecified: Secondary | ICD-10-CM

## 2017-01-03 DIAGNOSIS — S335XXA Sprain of ligaments of lumbar spine, initial encounter: Secondary | ICD-10-CM

## 2017-01-03 DIAGNOSIS — Z87828 Personal history of other (healed) physical injury and trauma: Secondary | ICD-10-CM | POA: Diagnosis not present

## 2017-01-03 DIAGNOSIS — K219 Gastro-esophageal reflux disease without esophagitis: Secondary | ICD-10-CM

## 2017-01-03 MED ORDER — RANITIDINE HCL 150 MG PO TABS: 150.0000 mg | ORAL_TABLET | Freq: Every day | ORAL | 1 refills | Status: DC

## 2017-01-03 NOTE — Progress Notes (Signed)
Medicine attending: Medical history, presenting problems, physical findings, and medications, reviewed with resident physician Dr Andrew Wallace on the day of the patient visit and I concur with his evaluation and management plan. 

## 2017-01-03 NOTE — Assessment & Plan Note (Signed)
Assessment: No complaints today.  Patient saw and was discharged from PT for having met his rehab goals.  Plan: - Monitor for symptoms in the future.

## 2017-01-03 NOTE — Assessment & Plan Note (Signed)
Assessment: DM controlled off medications.  Plan: - Foot exam done today - Needs eye exam

## 2017-01-03 NOTE — Assessment & Plan Note (Signed)
Assessment: Patient with borderline deficiency on screening labs in May 2018.  Did not start supplementation as prescribed.  Plan: - Had repeat vitamin D level checked at nephrology office on Oct 12.  Will attempt to obtain records.

## 2017-01-03 NOTE — Patient Instructions (Signed)
Thank you for coming to see me today. It was a pleasure. Today we talked about:   Blood pressure: Continue with current medications  Kidney disease: We will try to obtain labs from Dr Ena Dawley office  Please keep appointment with him that you have scheduled.  Please follow-up with me in 6 months.  If you have any questions or concerns, please do not hesitate to call the office at [phone].  Take Care,   Jule Ser, DO

## 2017-01-03 NOTE — Progress Notes (Signed)
CC: here for f/u HTN and DM  HPI:  William Perkins is a 66 y.o. man with a past medical history listed below here today for follow up of his HTN and DM.   For details of today's visit and the status of his chronic medical issues please refer to the assessment and plan.   Past Medical History:  Diagnosis Date  . Chronic renal insufficiency 03/21/2006   CrCl <50 ml  . DDD (degenerative disc disease), cervical   . GERD (gastroesophageal reflux disease) 03/22/2006  . Hand numbness 10/07/2009  . Hyperlipidemia 03/22/2008  . Hypertension   . Hypertension 03/22/2006  . Left ventricular hypertrophy 03/22/2006  . Leg pain, left 11/15/2009  . Lumbar strain 07/15/2008  . Renal insufficiency   . Soft tissue disorder 08/2008  . Tobacco abuse 03/22/2006   2 ppd x 30 years   Review of Systems:  Please see pertinent ROS reviewed in HPI and problem based charting.   Physical Exam:  Vitals:   01/03/17 1452  BP: (!) 151/70  Pulse: (!) 58  Temp: 97.7 F (36.5 C)  TempSrc: Oral  SpO2: 100%  Weight: 156 lb 12.8 oz (71.1 kg)  Height: _0  (1.803 m)   General: sitting in chair HEENT: NCAT, EOMI, no scleral icterus Cardiac: RRR Pulm: clear to auscultation bilaterally, moving normal volumes of air Abd: soft, nontender, nondistended, BS present Ext: warm and well perfused, no pedal edema Neuro: alert and oriented X3, cranial nerves II-XII grossly intact   Assessment & Plan:   See Encounters Tab for problem based charting.  Patient discussed with Dr. Beryle Beams .  GERD Assessment: Patient with Prilosec on medication list that he reports not taking.  He reports that he does take Zantac 1105m as needed.  Plan: Take Prilosec off medication list and add Zantac.  Type 2 diabetes mellitus with stage 3 chronic kidney disease, without long-term current use of insulin (HCC) Assessment: DM controlled off medications.  Plan: - Foot exam done today - Needs eye exam  Vitamin D  insufficiency Assessment: Patient with borderline deficiency on screening labs in May 2018.  Did not start supplementation as prescribed.  Plan: - Had repeat vitamin D level checked at nephrology office on Oct 12.  Will attempt to obtain records.  Chronic kidney disease Assessment: Patient followed up with Dr PPosey Prontoon Oct 12.  Had screening labs done to include vit D, PTH, SPEP, BMET, CBC.  Results for metabolic bone disease not available to me currently, but his renal function was stable with creatinine about 2.  Plan: Patient has follow up with nephrology in February and he was encouraged to keep this appointment.  LUMBAR STRAIN Assessment: No complaints today.  Patient saw and was discharged from PT for having met his rehab goals.  Plan: - Monitor for symptoms in the future.  Essential hypertension Assessment: BP mildly elevated today but was at goal when seen by nephrology in Oct 2018.  At that visit he was not taking his HCTZ due to side effects of diffuse muscle cramping and was encouraged to continue his current regimen as before.  He reports taking his BP earlier today and it was in the 1335Ksystolic.  Plan: -Continue amlodipine 10 mg QD -Continue lisinopril 40 mg QD -Continue hydralazine 50 mg BID -Continue clonidine 0.1 mg BID - Will not escalate blood pressure regimen today given otherwise normal readings at nephrology visit and reported home readings. - Have asked patient to keep a BP log and  bring to his follow up appointment so that we are able to corroborate

## 2017-01-03 NOTE — Assessment & Plan Note (Signed)
Assessment: Patient followed up with Dr Posey Pronto on Oct 12.  Had screening labs done to include vit D, PTH, SPEP, BMET, CBC.  Results for metabolic bone disease not available to me currently, but his renal function was stable with creatinine about 2.  Plan: Patient has follow up with nephrology in February and he was encouraged to keep this appointment.

## 2017-01-03 NOTE — Assessment & Plan Note (Signed)
Assessment: Patient with Prilosec on medication list that he reports not taking.  He reports that he does take Zantac 150mg  as needed.  Plan: Take Prilosec off medication list and add Zantac.

## 2017-01-03 NOTE — Assessment & Plan Note (Signed)
Assessment: BP mildly elevated today but was at goal when seen by nephrology in Oct 2018.  At that visit he was not taking his HCTZ due to side effects of diffuse muscle cramping and was encouraged to continue his current regimen as before.  He reports taking his BP earlier today and it was in the 100P systolic.  Plan: -Continue amlodipine 10 mg QD -Continue lisinopril 40 mg QD -Continue hydralazine 50 mg BID -Continue clonidine 0.1 mg BID - Will not escalate blood pressure regimen today given otherwise normal readings at nephrology visit and reported home readings. - Have asked patient to keep a BP log and bring to his follow up appointment so that we are able to corroborate

## 2017-03-11 DIAGNOSIS — N183 Chronic kidney disease, stage 3 (moderate): Secondary | ICD-10-CM | POA: Diagnosis not present

## 2017-03-18 DIAGNOSIS — N183 Chronic kidney disease, stage 3 (moderate): Secondary | ICD-10-CM | POA: Diagnosis not present

## 2017-03-18 DIAGNOSIS — D631 Anemia in chronic kidney disease: Secondary | ICD-10-CM | POA: Diagnosis not present

## 2017-03-18 DIAGNOSIS — I129 Hypertensive chronic kidney disease with stage 1 through stage 4 chronic kidney disease, or unspecified chronic kidney disease: Secondary | ICD-10-CM | POA: Diagnosis not present

## 2017-03-18 DIAGNOSIS — E559 Vitamin D deficiency, unspecified: Secondary | ICD-10-CM | POA: Diagnosis not present

## 2017-03-20 ENCOUNTER — Encounter: Payer: Self-pay | Admitting: Internal Medicine

## 2017-04-30 DIAGNOSIS — D125 Benign neoplasm of sigmoid colon: Secondary | ICD-10-CM | POA: Diagnosis not present

## 2017-04-30 DIAGNOSIS — D123 Benign neoplasm of transverse colon: Secondary | ICD-10-CM | POA: Diagnosis not present

## 2017-06-06 ENCOUNTER — Other Ambulatory Visit: Payer: Self-pay

## 2017-06-06 ENCOUNTER — Ambulatory Visit (INDEPENDENT_AMBULATORY_CARE_PROVIDER_SITE_OTHER): Payer: BLUE CROSS/BLUE SHIELD | Admitting: Internal Medicine

## 2017-06-06 ENCOUNTER — Encounter: Payer: Self-pay | Admitting: Internal Medicine

## 2017-06-06 VITALS — BP 149/64 | HR 77 | Temp 98.2°F | Ht 71.0 in | Wt 155.5 lb

## 2017-06-06 DIAGNOSIS — R768 Other specified abnormal immunological findings in serum: Secondary | ICD-10-CM

## 2017-06-06 DIAGNOSIS — I129 Hypertensive chronic kidney disease with stage 1 through stage 4 chronic kidney disease, or unspecified chronic kidney disease: Secondary | ICD-10-CM | POA: Diagnosis not present

## 2017-06-06 DIAGNOSIS — N183 Chronic kidney disease, stage 3 unspecified: Secondary | ICD-10-CM

## 2017-06-06 DIAGNOSIS — Z79899 Other long term (current) drug therapy: Secondary | ICD-10-CM | POA: Diagnosis not present

## 2017-06-06 DIAGNOSIS — I10 Essential (primary) hypertension: Secondary | ICD-10-CM

## 2017-06-06 DIAGNOSIS — E1122 Type 2 diabetes mellitus with diabetic chronic kidney disease: Secondary | ICD-10-CM

## 2017-06-06 LAB — GLUCOSE, CAPILLARY: Glucose-Capillary: 136 mg/dL — ABNORMAL HIGH (ref 65–99)

## 2017-06-06 LAB — POCT GLYCOSYLATED HEMOGLOBIN (HGB A1C): Hemoglobin A1C: 5.6

## 2017-06-06 NOTE — Progress Notes (Signed)
   CC: f/u DM and HTN  HPI:  [name] is a 67 y.o. man with a past medical history listed below here today for follow up of his DM.   For details of today's visit and the status of his chronic medical issues please refer to the assessment and plan.   Past Medical History:  Diagnosis Date  . Chronic renal insufficiency 03/21/2006   CrCl <50 ml  . DDD (degenerative disc disease), cervical   . GERD (gastroesophageal reflux disease) 03/22/2006  . Hand numbness 10/07/2009  . Hyperlipidemia 03/22/2008  . Hypertension   . Hypertension 03/22/2006  . Left ventricular hypertrophy 03/22/2006  . Leg pain, left 11/15/2009  . Lumbar strain 07/15/2008  . Renal insufficiency   . Soft tissue disorder 08/2008  . Tobacco abuse 03/22/2006   2 ppd x 30 years   Review of Systems:  Please see pertinent ROS reviewed in HPI and problem based charting.   Physical Exam:  Vitals:   06/06/17 1442  BP: (!) 149/64  Pulse: 77  Temp: 98.2 F (36.8 C)  TempSrc: Oral  SpO2: 99%  Weight: 155 lb 8 oz (70.5 kg)  Height: 5\' 11"  (1.803 m)   Physical Exam  Constitutional: He is oriented to person, place, and time and well-developed, well-nourished, and in no distress.  HENT:  Head: Normocephalic and atraumatic.  Cardiovascular: Normal rate and regular rhythm.  Pulmonary/Chest: Effort normal and breath sounds normal.  Musculoskeletal: He exhibits no edema.  Neurological: He is alert and oriented to person, place, and time.  Skin: Skin is warm and dry.  Psychiatric: Mood and affect normal.     Assessment & Plan:   See Encounters Tab for problem based charting.  Patient discussed with Dr. Lynnae January .  Essential hypertension BP: (!) 149/64  His BP is above goal today on current regimen.  We confirmed over the phone with his pharmacy his current medications are amlodipine 10mg  daily, lisinopril 40mg  daily, hydralazine 50mg  BID, and clonidine 0.1mg  BID.  We discussed increasing his hydralazine  to 100mg  BID as noted in Dr. Serita Grit prior office visits.  Patient today states he has follow up in a couple weeks with him and prefers to monitor his BP over the next couple weeks before doing so.  I encouraged him to continue monitoring his BP at home and keep a log to bring to future appointments.  I asked him to follow up in 1 month for his blood pressure.  Type 2 diabetes mellitus with stage 3 chronic kidney disease, without long-term current use of insulin (HCC) His hemoglobin A1c today is 5.6 and he is not currently on any medications.  He was reminded today to schedule a follow up eye exam with his provider.  We completed his foot exam today as well.   Chronic kidney disease Patient follows with Dr Posey Pronto for his CKD.  He reports having follow up in about 2 weeks with him and is going next week for his labs prior to the visit.  When he last saw Dr Posey Pronto in January, F/u with Dr Posey Pronto labs revealed M spike and elevated IgG component.  No anemia, hypercalcemia, bone pain.  He appears to have symmetrically elevated kappa-to-lambda light chains.  Dr Posey Pronto referred to hematology for 2nd opinion.

## 2017-06-06 NOTE — Patient Instructions (Signed)
FOLLOW-UP INSTRUCTIONS When: about 1 month For: blood pressure follow up What to bring: your medications and your blood pressure log.  Also bring the recommendations from your visit with Dr Posey Pronto.  We will not increase your BP medications today and let you meet with Dr Posey Pronto in a couple weeks.

## 2017-06-07 ENCOUNTER — Encounter: Payer: Self-pay | Admitting: Internal Medicine

## 2017-06-07 NOTE — Assessment & Plan Note (Signed)
Patient follows with Dr Posey Pronto for his CKD.  He reports having follow up in about 2 weeks with him and is going next week for his labs prior to the visit.  When he last saw Dr Posey Pronto in January, F/u with Dr Posey Pronto labs revealed M spike and elevated IgG component.  No anemia, hypercalcemia, bone pain.  He appears to have symmetrically elevated kappa-to-lambda light chains.  Dr Posey Pronto referred to hematology for 2nd opinion.

## 2017-06-07 NOTE — Assessment & Plan Note (Signed)
BP: (!) 149/64  His BP is above goal today on current regimen.  We confirmed over the phone with his pharmacy his current medications are amlodipine 10mg  daily, lisinopril 40mg  daily, hydralazine 50mg  BID, and clonidine 0.1mg  BID.  We discussed increasing his hydralazine to 100mg  BID as noted in Dr. Serita Grit prior office visits.  Patient today states he has follow up in a couple weeks with him and prefers to monitor his BP over the next couple weeks before doing so.  I encouraged him to continue monitoring his BP at home and keep a log to bring to future appointments.  I asked him to follow up in 1 month for his blood pressure.

## 2017-06-07 NOTE — Assessment & Plan Note (Signed)
His hemoglobin A1c today is 5.6 and he is not currently on any medications.  He was reminded today to schedule a follow up eye exam with his provider.  We completed his foot exam today as well.

## 2017-06-10 DIAGNOSIS — N183 Chronic kidney disease, stage 3 (moderate): Secondary | ICD-10-CM | POA: Diagnosis not present

## 2017-06-11 NOTE — Progress Notes (Signed)
Internal Medicine Clinic Attending  Case discussed with Dr. Wallace at the time of the visit.  We reviewed the resident's history and exam and pertinent patient test results.  I agree with the assessment, diagnosis, and plan of care documented in the resident's note.  

## 2017-06-18 DIAGNOSIS — D631 Anemia in chronic kidney disease: Secondary | ICD-10-CM | POA: Diagnosis not present

## 2017-06-18 DIAGNOSIS — I129 Hypertensive chronic kidney disease with stage 1 through stage 4 chronic kidney disease, or unspecified chronic kidney disease: Secondary | ICD-10-CM | POA: Diagnosis not present

## 2017-06-18 DIAGNOSIS — N183 Chronic kidney disease, stage 3 (moderate): Secondary | ICD-10-CM | POA: Diagnosis not present

## 2017-06-18 DIAGNOSIS — E559 Vitamin D deficiency, unspecified: Secondary | ICD-10-CM | POA: Diagnosis not present

## 2017-08-05 ENCOUNTER — Other Ambulatory Visit: Payer: Self-pay | Admitting: Internal Medicine

## 2017-08-05 DIAGNOSIS — I10 Essential (primary) hypertension: Secondary | ICD-10-CM

## 2017-08-05 MED ORDER — HYDRALAZINE HCL 100 MG PO TABS: 100.0000 mg | ORAL_TABLET | Freq: Two times a day (BID) | ORAL | 3 refills | Status: DC

## 2017-08-31 ENCOUNTER — Encounter: Payer: Self-pay | Admitting: *Deleted

## 2017-11-11 DIAGNOSIS — N183 Chronic kidney disease, stage 3 (moderate): Secondary | ICD-10-CM | POA: Diagnosis not present

## 2017-11-21 ENCOUNTER — Ambulatory Visit: Payer: BLUE CROSS/BLUE SHIELD

## 2017-11-21 DIAGNOSIS — D631 Anemia in chronic kidney disease: Secondary | ICD-10-CM | POA: Diagnosis not present

## 2017-11-21 DIAGNOSIS — I129 Hypertensive chronic kidney disease with stage 1 through stage 4 chronic kidney disease, or unspecified chronic kidney disease: Secondary | ICD-10-CM | POA: Diagnosis not present

## 2017-11-21 DIAGNOSIS — N183 Chronic kidney disease, stage 3 (moderate): Secondary | ICD-10-CM | POA: Diagnosis not present

## 2017-11-21 DIAGNOSIS — E559 Vitamin D deficiency, unspecified: Secondary | ICD-10-CM | POA: Diagnosis not present

## 2017-12-11 NOTE — Progress Notes (Signed)
   CC: back pain  HPI:  Mr.Mars Botting is a 67 y.o. male with PMHx as listed below here to follow up chronic low back pain  For full details of HPI please see encounter tab for assessment and plan  Past Medical History:  Diagnosis Date  . Chronic renal insufficiency 03/21/2006   CrCl <50 ml  . DDD (degenerative disc disease), cervical   . GERD (gastroesophageal reflux disease) 03/22/2006  . Hand numbness 10/07/2009  . Hyperlipidemia 03/22/2008  . Hypertension   . Hypertension 03/22/2006  . Left ventricular hypertrophy 03/22/2006  . Leg pain, left 11/15/2009  . Lumbar strain 07/15/2008  . Renal insufficiency   . Soft tissue disorder 08/2008  . Tobacco abuse 03/22/2006   2 ppd x 30 years    Physical Exam:  Vitals:   12/12/17 1421  BP: (!) 179/69  Pulse: 62  Temp: 98 F (36.7 C)  TempSrc: Oral  SpO2: 99%  Weight: 152 lb 8 oz (69.2 kg)  Height: 5\' 11"  (1.803 m)   Gen: Well appearing, NAD CV: RRR, no murmurs Pulm: Normal effort, CTA throughout, no wheezing Ext: Warm, no edema, normal joints Neuro: 5/5 lower extremity strength. Patellar and achilles reflexes normal and symmetric. No clonus.  MSK: tenderness over paraspinal regions bilaterally. Tightness of hamstrings with leg raise. No pain with flexion, internal rotation, or external rotation of the hip. Straight leg raise negative bilaterally.    Assessment & Plan:   See Encounters Tab for problem based charting.  Patient seen with Dr. Dareen Piano

## 2017-12-12 ENCOUNTER — Ambulatory Visit (INDEPENDENT_AMBULATORY_CARE_PROVIDER_SITE_OTHER): Payer: BLUE CROSS/BLUE SHIELD | Admitting: Internal Medicine

## 2017-12-12 ENCOUNTER — Other Ambulatory Visit: Payer: Self-pay

## 2017-12-12 ENCOUNTER — Encounter: Payer: Self-pay | Admitting: Internal Medicine

## 2017-12-12 VITALS — BP 195/82 | HR 66 | Temp 98.0°F | Ht 71.0 in | Wt 152.5 lb

## 2017-12-12 DIAGNOSIS — Z79899 Other long term (current) drug therapy: Secondary | ICD-10-CM

## 2017-12-12 DIAGNOSIS — I10 Essential (primary) hypertension: Secondary | ICD-10-CM

## 2017-12-12 DIAGNOSIS — D649 Anemia, unspecified: Secondary | ICD-10-CM | POA: Insufficient documentation

## 2017-12-12 DIAGNOSIS — G8929 Other chronic pain: Secondary | ICD-10-CM | POA: Diagnosis not present

## 2017-12-12 DIAGNOSIS — E785 Hyperlipidemia, unspecified: Secondary | ICD-10-CM

## 2017-12-12 DIAGNOSIS — E559 Vitamin D deficiency, unspecified: Secondary | ICD-10-CM

## 2017-12-12 DIAGNOSIS — N183 Chronic kidney disease, stage 3 unspecified: Secondary | ICD-10-CM

## 2017-12-12 DIAGNOSIS — M5442 Lumbago with sciatica, left side: Secondary | ICD-10-CM | POA: Diagnosis not present

## 2017-12-12 DIAGNOSIS — M5441 Lumbago with sciatica, right side: Secondary | ICD-10-CM | POA: Diagnosis not present

## 2017-12-12 DIAGNOSIS — E1122 Type 2 diabetes mellitus with diabetic chronic kidney disease: Secondary | ICD-10-CM

## 2017-12-12 DIAGNOSIS — M4310 Spondylolisthesis, site unspecified: Secondary | ICD-10-CM | POA: Insufficient documentation

## 2017-12-12 DIAGNOSIS — Z2821 Immunization not carried out because of patient refusal: Secondary | ICD-10-CM

## 2017-12-12 DIAGNOSIS — Z0000 Encounter for general adult medical examination without abnormal findings: Secondary | ICD-10-CM

## 2017-12-12 DIAGNOSIS — I129 Hypertensive chronic kidney disease with stage 1 through stage 4 chronic kidney disease, or unspecified chronic kidney disease: Secondary | ICD-10-CM

## 2017-12-12 LAB — POCT GLYCOSYLATED HEMOGLOBIN (HGB A1C): Hemoglobin A1C: 5.4 % (ref 4.0–5.6)

## 2017-12-12 LAB — GLUCOSE, CAPILLARY: Glucose-Capillary: 159 mg/dL — ABNORMAL HIGH (ref 70–99)

## 2017-12-12 MED ORDER — CLONIDINE HCL 0.1 MG PO TABS: 0.1000 mg | ORAL_TABLET | Freq: Two times a day (BID) | ORAL | 11 refills | Status: DC

## 2017-12-12 MED ORDER — PRAVASTATIN SODIUM 40 MG PO TABS: 40.0000 mg | ORAL_TABLET | Freq: Every evening | ORAL | 11 refills | Status: DC

## 2017-12-12 MED ORDER — AMLODIPINE BESYLATE 10 MG PO TABS: 10.0000 mg | ORAL_TABLET | Freq: Every day | ORAL | 11 refills | Status: DC

## 2017-12-12 MED ORDER — LISINOPRIL 40 MG PO TABS: 40.0000 mg | ORAL_TABLET | Freq: Every day | ORAL | 11 refills | Status: DC

## 2017-12-12 MED ORDER — CYCLOBENZAPRINE HCL 5 MG PO TABS: 5.0000 mg | ORAL_TABLET | Freq: Every day | ORAL | 0 refills | Status: DC

## 2017-12-12 NOTE — Assessment & Plan Note (Signed)
Reports tolerance of pravastatin. Will refill today

## 2017-12-12 NOTE — Assessment & Plan Note (Signed)
a1c 5.4 today. Not on any medications. He was reminded to schedule an eye exam. He is up to date on his foot exam. He declined pneumonia vaccine

## 2017-12-12 NOTE — Assessment & Plan Note (Signed)
Reports that he started taking supplementation last month

## 2017-12-12 NOTE — Assessment & Plan Note (Addendum)
Declining hemoglobin over the last year. 12.2 a year ago --> 10.7 --> 9.2 last month. I do not see an MCV in his most recent labs from [facility]. He has never had a colonoscopy and denies melena, hematochezia, pain or straining with defecation. Denies symptoms of anemia such as lightheadedness, dizziness, fatigue. Denies decreased appetite, weight loss, night sweats. Anemia could be due to CKD but needs a colonoscopy to rule out other pathology.   Plan: - iron studies - colonoscopy

## 2017-12-12 NOTE — Assessment & Plan Note (Addendum)
Reports several month history of low back pain worse with walking and heavy lifting. Located on paraspinal region, right side worse than left, with occasional tingling sensation radiating down right leg to the foot. The pain is better when he sits, lays, or walks bent forward. He has tried PT, tylenol, cyclobenzaprine, heat, and topical anesthetics without much relief. Denies bowel/bladder incontinence. Denies falls. 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 Exam reveals tenderness over paraspinal regions. No midline tenderness. Straight leg raise is negative. 5/5 lower extremity strength. Most consistent with spinal stenosis.   Plan: - lumbar spine x-ray  - pain management: tylenol during the day and flexeril qhs - referral to PM&R - patient declined PT - given work note for weight lifting restriction  - provided with handout of exercise and stretches to do at home

## 2017-12-12 NOTE — Assessment & Plan Note (Addendum)
BP Readings from Last 3 Encounters:  12/12/17 (!) 195/82  06/06/17 (!) 149/64  01/03/17 (!) 151/70   Elevated today. Denies headache, chest pain, palpitations, blurry vision, abdominal pain. Feels his back pain and stress are contributing. Reports compliance with amlodipine, clonidine, hydralazine, and lisinopril. Checks BP at home and reports 130s/70s  Plan: - Discussed switching to clonidine patch but patient declined - instructed to f/u in 1 month for BP recheck and to bring his home monitor with him  - BMP today

## 2017-12-12 NOTE — Assessment & Plan Note (Addendum)
Offered flu, pneumonia, and tdap vaccine. Patient declined.  Ordered GI referral for colonoscopy

## 2017-12-12 NOTE — Patient Instructions (Addendum)
It was nice seeing you today. Thank you for choosing [facility] for your Primary Care.   Today we talked about:  1) Back Pain: I think you have something called Spinal Stenosis. I have ordered an x-ray of your low back and sent in a referral to the PM&R doctors (physical medicine and rehabilitation). You can continue taking tylenol (do not take more than 3,000mg  a day) and take cyclobenzaprine (muscle relaxer) only at night.   2) You blood pressure is high. Please bring your home meter with you to your next appointment so we can compare.   3) Low blood count (anemia): This could be a normal response from your chronic kidney disease but it could also be a sign of something more serious. We will check some other blood tests and refer you for a colonoscopy.    FOLLOW-UP INSTRUCTIONS When: 1 month for nurse visit For:  Blood pressure What to bring: Home blood pressure monitor   Please contact the clinic if you have any problems, or need to be seen sooner.    Spinal Stenosis Spinal stenosis happens when the open space (spinal canal) between the bones of your spine (vertebrae) gets smaller. It is caused by bone pushing into the open spaces of your backbone (spine). This puts pressure on your backbone and the nerves in your backbone. Treatment often focuses on managing any pain and symptoms. In some cases, surgery may be needed. Follow these instructions at home: Managing pain, stiffness, and swelling  Do all exercises and stretches as told by your doctor.  Stand and sit up straight (use good posture). If you were given a brace or a corset, wear it as told by your doctor.  Do not do any activities that cause pain. Ask your doctor what activities are safe for you.  Do not lift anything that is heavier than 10 lb (4.5 kg) or heavier than your doctor tells you.  Try to stay at a healthy weight. Talk with your doctor if you need help losing weight.  If directed, put heat on the  affected area as often as told by your doctor. Use the heat source that your doctor recommends, such as a moist heat pack or a heating pad. ? Put a towel between your skin and the heat source. ? Leave the heat on for 20-30 minutes. ? Remove the heat if your skin turns bright red. This is especially important if you are not able to feel pain, heat, or cold. You may have a greater risk of getting burned. General instructions  Take over-the-counter and prescription medicines only as told by your doctor.  Do not use any products that contain nicotine or tobacco, such as cigarettes and e-cigarettes. If you need help quitting, ask your doctor.  Eat a healthy diet. This includes plenty of fruits and vegetables, whole grains, and low-fat (lean) protein.  Keep all follow-up visits as told by your doctor. This is important. Contact a doctor if:  Your symptoms do not get better.  Your symptoms get worse.  You have a fever. Get help right away if:  You have new or worse pain in your neck or upper back.  You have very bad pain that medicine does not control.  You are dizzy.  You have vision problems, blurred vision, or double vision.  You have a very bad headache that is worse when you stand.  You feel sick to your stomach (nauseous).  You throw up (vomit).  You have new  or worse numbness or tingling in your back or legs.  You have pain, redness, swelling, or warmth in your arm or leg. Summary  Spinal stenosis happens when the open space (spinal canal) between the bones of your spine gets smaller (narrow).  Contact a doctor if your symptoms get worse.  In some cases, surgery may be needed. This information is not intended to replace advice given to you by your health care provider. Make sure you discuss any questions you have with your health care provider. Document Released: 06/15/2010 Document Revised: 01/25/2016 Document Reviewed: 01/25/2016 Elsevier Interactive Patient  Education  2017 [employer].

## 2017-12-12 NOTE — Progress Notes (Signed)
Anemia panel was not done; called pt - asked if he could come to the clinic when he has x-ray done, can be done on the sae day. Stated he would.

## 2017-12-13 ENCOUNTER — Other Ambulatory Visit (INDEPENDENT_AMBULATORY_CARE_PROVIDER_SITE_OTHER): Payer: BLUE CROSS/BLUE SHIELD

## 2017-12-13 ENCOUNTER — Ambulatory Visit (HOSPITAL_COMMUNITY)
Admission: RE | Admit: 2017-12-13 | Discharge: 2017-12-13 | Disposition: A | Discharge status: Home / Self Care | Payer: BLUE CROSS/BLUE SHIELD | Source: Ambulatory Visit | Attending: Internal Medicine | Admitting: Internal Medicine

## 2017-12-13 DIAGNOSIS — D649 Anemia, unspecified: Secondary | ICD-10-CM | POA: Diagnosis not present

## 2017-12-13 DIAGNOSIS — M5441 Lumbago with sciatica, right side: Secondary | ICD-10-CM | POA: Insufficient documentation

## 2017-12-13 DIAGNOSIS — G8929 Other chronic pain: Secondary | ICD-10-CM | POA: Insufficient documentation

## 2017-12-13 DIAGNOSIS — M5136 Other intervertebral disc degeneration, lumbar region: Secondary | ICD-10-CM | POA: Insufficient documentation

## 2017-12-13 DIAGNOSIS — M5442 Lumbago with sciatica, left side: Secondary | ICD-10-CM | POA: Diagnosis not present

## 2017-12-13 DIAGNOSIS — M545 Low back pain: Secondary | ICD-10-CM | POA: Diagnosis not present

## 2017-12-13 NOTE — Progress Notes (Signed)
Internal Medicine Clinic Attending  I saw and evaluated the patient.  I personally confirmed the key portions of the history and exam documented by Dr. Vogel and I reviewed pertinent patient test results.  The assessment, diagnosis, and plan were formulated together and I agree with the documentation in the resident's note.  

## 2017-12-14 LAB — ANEMIA PROFILE B
Basophils Absolute: 0.1 10*3/uL (ref 0.0–0.2)
Basos: 1 %
EOS (ABSOLUTE): 0.4 10*3/uL (ref 0.0–0.4)
Eos: 5 %
Ferritin: 471 ng/mL — ABNORMAL HIGH (ref 30–400)
Folate: 14.1 ng/mL (ref >3.0)
Hematocrit: 36.2 % — ABNORMAL LOW (ref 37.5–51.0)
Hemoglobin: 11.8 g/dL — ABNORMAL LOW (ref 13.0–17.7)
Immature Grans (Abs): 0 10*3/uL (ref 0.0–0.1)
Immature Granulocytes: 0 %
Iron Saturation: 22 % (ref 15–55)
Iron: 52 ug/dL (ref 38–169)
Lymphocytes Absolute: 1.4 10*3/uL (ref 0.7–3.1)
Lymphs: 19 %
MCH: 29.9 pg (ref 26.6–33.0)
MCHC: 32.6 g/dL (ref 31.5–35.7)
MCV: 92 fL (ref 79–97)
Monocytes Absolute: 0.7 10*3/uL (ref 0.1–0.9)
Monocytes: 10 %
Neutrophils Absolute: 4.8 10*3/uL (ref 1.4–7.0)
Neutrophils: 65 %
Platelets: 342 10*3/uL (ref 150–450)
RBC: 3.95 x10E6/uL — ABNORMAL LOW (ref 4.14–5.80)
RDW: 14.3 % (ref 12.3–15.4)
Retic Ct Pct: 2 % (ref 0.6–2.6)
Total Iron Binding Capacity: 238 ug/dL — ABNORMAL LOW (ref 250–450)
UIBC: 186 ug/dL (ref 111–343)
Vitamin B-12: 392 pg/mL (ref 232–1245)
WBC: 7.4 10*3/uL (ref 3.4–10.8)

## 2017-12-18 ENCOUNTER — Encounter: Payer: Self-pay | Admitting: Internal Medicine

## 2017-12-24 ENCOUNTER — Telehealth: Payer: Self-pay

## 2017-12-24 NOTE — Telephone Encounter (Signed)
Requesting x-ray result. Please call back.

## 2017-12-25 NOTE — Telephone Encounter (Signed)
Called patient. No answer. No voicemail. Will call again tomorrow. His x-rays shows signs of arthritis. Physical therapy could be helpful. I also referred him to PM&R when I saw him the other week in clinic.

## 2017-12-26 ENCOUNTER — Other Ambulatory Visit: Payer: Self-pay

## 2017-12-26 NOTE — Telephone Encounter (Signed)
amLODipine (NORVASC) 10 MG tablet,  lisinopril (PRINIVIL,ZESTRIL) 40 MG tablet,  pravastatin (PRAVACHOL) 40 MG tablet, refill request @  [facility] ([place]), [place] [phone] (Phone) [phone] (Fax)

## 2017-12-26 NOTE — Telephone Encounter (Signed)
These Rxs were sent 12/12/2017. Verified with pharmacy that they are ready for pick up. Patient notified. Hubbard Hartshorn, RN, BSN

## 2017-12-26 NOTE — Telephone Encounter (Signed)
Patient called in. Relayed info below. Gave phone number to PM&R to schedule appt. Also gave code for patient to activate My Chart. Patient states he called pharmacy regarding refills sent 12/12/2017 and they are not there. Call placed to Pharmacy. Spoke with Dorian who states patient already picked up flexeril and all others are ready to be picked up. Patient notified. Hubbard Hartshorn, RN, BSN

## 2018-01-17 ENCOUNTER — Other Ambulatory Visit: Payer: Self-pay

## 2018-01-17 ENCOUNTER — Ambulatory Visit (AMBULATORY_SURGERY_CENTER): Payer: Self-pay | Admitting: *Deleted

## 2018-01-17 VITALS — Ht 68.0 in | Wt 154.0 lb

## 2018-01-17 DIAGNOSIS — Z1211 Encounter for screening for malignant neoplasm of colon: Secondary | ICD-10-CM

## 2018-01-17 MED ORDER — SUPREP BOWEL PREP KIT 17.5-3.13-1.6 GM/177ML PO SOLN: 1.0000 | Freq: Once | ORAL | 0 refills | Status: AC

## 2018-01-17 NOTE — Progress Notes (Signed)
Patient denies any allergies to egg or soy products. Patient denies complications with anesthesia/sedation.  Patient denies oxygen use at home and denies diet medications. Pamphlet given on colonoscopy. 

## 2018-01-20 ENCOUNTER — Encounter: Payer: Self-pay | Admitting: Internal Medicine

## 2018-01-27 ENCOUNTER — Ambulatory Visit (AMBULATORY_SURGERY_CENTER): Payer: BLUE CROSS/BLUE SHIELD | Admitting: Internal Medicine

## 2018-01-27 ENCOUNTER — Encounter: Payer: Self-pay | Admitting: Internal Medicine

## 2018-01-27 VITALS — BP 123/69 | HR 63 | Temp 98.9°F | Resp 9 | Ht 68.0 in | Wt 154.0 lb

## 2018-01-27 DIAGNOSIS — D128 Benign neoplasm of rectum: Secondary | ICD-10-CM

## 2018-01-27 DIAGNOSIS — D120 Benign neoplasm of cecum: Secondary | ICD-10-CM

## 2018-01-27 DIAGNOSIS — D123 Benign neoplasm of transverse colon: Secondary | ICD-10-CM

## 2018-01-27 DIAGNOSIS — D122 Benign neoplasm of ascending colon: Secondary | ICD-10-CM

## 2018-01-27 DIAGNOSIS — Z1211 Encounter for screening for malignant neoplasm of colon: Secondary | ICD-10-CM | POA: Diagnosis not present

## 2018-01-27 DIAGNOSIS — D124 Benign neoplasm of descending colon: Secondary | ICD-10-CM | POA: Diagnosis not present

## 2018-01-27 MED ORDER — SODIUM CHLORIDE 0.9 % IV SOLN: 500.0000 mL | Freq: Once | INTRAVENOUS | Status: DC

## 2018-01-27 NOTE — Progress Notes (Signed)
Report to PACU, RN, vss, BBS= Clear.  

## 2018-01-27 NOTE — Op Note (Signed)
[place] Patient Name: William Perkins Procedure Date: 01/27/2018 11:14 AM MRN: [id number] Endoscopist: Docia Chuck. Henrene Pastor , MD Age: 67 Referring MD:  Date of Birth: [birth year] Gender: Male Account #: [id number] Procedure:                Colonoscopy with cold snare x 9; hot snare x 1;                            submucosal injection Indications:              Screening for colorectal malignant neoplasm Medicines:                Monitored Anesthesia Care Procedure:                Pre-Anesthesia Assessment:                           - Prior to the procedure, a History and Physical                            was performed, and patient medications and                            allergies were reviewed. The patient's tolerance of                            previous anesthesia was also reviewed. The risks                            and benefits of the procedure and the sedation                            options and risks were discussed with the patient.                            All questions were answered, and informed consent                            was obtained. Prior Anticoagulants: The patient has                            taken no previous anticoagulant or antiplatelet                            agents. ASA Grade Assessment: II - A patient with                            mild systemic disease. After reviewing the risks                            and benefits, the patient was deemed in                            satisfactory condition to undergo the procedure.  After obtaining informed consent, the colonoscope                            was passed under direct vision. Throughout the                            procedure, the patient's blood pressure, pulse, and                            oxygen saturations were monitored continuously. The                            Colonoscope was introduced through the anus and                            advanced to  the the cecum, identified by                            appendiceal orifice and ileocecal valve. The                            ileocecal valve, appendiceal orifice, and rectum                            were photographed. The quality of the bowel                            preparation was excellent. The colonoscopy was                            performed without difficulty. The patient tolerated                            the procedure well. The bowel preparation used was                            SUPREP. Scope In: 11:26:46 AM Scope Out: 11:58:24 AM Scope Withdrawal Time: 0 hours 23 minutes 33 seconds  Total Procedure Duration: 0 hours 31 minutes 38 seconds  Findings:                 A 15 mm polyp was found in the rectum. The polyp                            was semi-pedunculated. The polyp was removed with a                            saline injection-lift technique using a hot snare.                            Resection and retrieval were complete.                           Nine polyps were found in the descending colon,  transverse colon, ascending colon and cecum. The                            polyps were 4 to 10 mm in size. These polyps were                            removed with a cold snare. Resection and retrieval                            were complete.                           The exam was otherwise without abnormality on                            direct and retroflexion views. Complications:            No immediate complications. Estimated blood loss:                            None. Estimated Blood Loss:     Estimated blood loss: none. Impression:               - One 15 mm polyp in the rectum, removed using                            injection-lift and a hot snare. Resected and                            retrieved.                           - Nine 4 to 10 mm polyps in the descending colon,                            in the transverse colon,  in the ascending colon and                            in the cecum, removed with a cold snare. Resected                            and retrieved.                           - The examination was otherwise normal on direct                            and retroflexion views. Recommendation:           - Repeat colonoscopy in 1 year for surveillance.                           - Patient has a contact number available for  emergencies. The signs and symptoms of potential                            delayed complications were discussed with the                            patient. Return to normal activities tomorrow.                            Written discharge instructions were provided to the                            patient.                           - Resume previous diet.                           - Continue present medications.                           - Await pathology results. Docia Chuck. Henrene Pastor, MD 01/27/2018 12:08:54 PM This report has been signed electronically.

## 2018-01-27 NOTE — Progress Notes (Signed)
Pt's states no medical or surgical changes since previsit or office visit. 

## 2018-01-27 NOTE — Patient Instructions (Signed)
10 polyps removed today. Repeat colonoscopy in 1 year.   YOU HAD AN ENDOSCOPIC PROCEDURE TODAY AT [facility]:   Refer to the procedure report that was given to you for any specific questions about what was found during the examination.  If the procedure report does not answer your questions, please call your gastroenterologist to clarify.  If you requested that your care partner not be given the details of your procedure findings, then the procedure report has been included in a sealed envelope for you to review at your convenience later.  YOU SHOULD EXPECT: Some feelings of bloating in the abdomen. Passage of more gas than usual.  Walking can help get rid of the air that was put into your GI tract during the procedure and reduce the bloating. If you had a lower endoscopy (such as a colonoscopy or flexible sigmoidoscopy) you may notice spotting of blood in your stool or on the toilet paper. If you underwent a bowel prep for your procedure, you may not have a normal bowel movement for a few days.  Please Note:  You might notice some irritation and congestion in your nose or some drainage.  This is from the oxygen used during your procedure.  There is no need for concern and it should clear up in a day or so.  SYMPTOMS TO REPORT IMMEDIATELY:   Following lower endoscopy (colonoscopy or flexible sigmoidoscopy):  Excessive amounts of blood in the stool  Significant tenderness or worsening of abdominal pains  Swelling of the abdomen that is new, acute  Fever of 100F or higher  For urgent or emergent issues, a gastroenterologist can be reached at any hour by calling [phone].   DIET:  We do recommend a small meal at first, but then you may proceed to your regular diet.  Drink plenty of fluids but you should avoid alcoholic beverages for 24 hours.  ACTIVITY:  You should plan to take it easy for the rest of today and you should NOT DRIVE or use heavy machinery until tomorrow  (because of the sedation medicines used during the test).    FOLLOW UP: Our staff will call the number listed on your records the next business day following your procedure to check on you and address any questions or concerns that you may have regarding the information given to you following your procedure. If we do not reach you, we will leave a message.  However, if you are feeling well and you are not experiencing any problems, there is no need to return our call.  We will assume that you have returned to your regular daily activities without incident.  If any biopsies were taken you will be contacted by phone or by letter within the next 1-3 weeks.  Please call us at [phone] if you have not heard about the biopsies in 3 weeks.    SIGNATURES/CONFIDENTIALITY: You and/or your care partner have signed paperwork which will be entered into your electronic medical record.  These signatures attest to the fact that that the information above on your After Visit Summary has been reviewed and is understood.  Full responsibility of the confidentiality of this discharge information lies with you and/or your care-partner.

## 2018-01-27 NOTE — Progress Notes (Signed)
Called to room to assist during endoscopic procedure.  Patient ID and intended procedure confirmed with present staff. Received instructions for my participation in the procedure from the performing physician.  

## 2018-01-28 ENCOUNTER — Telehealth: Payer: Self-pay

## 2018-01-28 ENCOUNTER — Encounter: Payer: Self-pay | Admitting: Internal Medicine

## 2018-01-28 DIAGNOSIS — Z860100 Personal history of colon polyps, unspecified: Secondary | ICD-10-CM | POA: Insufficient documentation

## 2018-01-28 NOTE — Telephone Encounter (Signed)
  Follow up Call-  Call back number 01/27/2018  Post procedure Call Back phone  # [phone]  Permission to leave phone message Yes  Some recent data might be hidden     Patient questions:  Do you have a fever, pain , or abdominal swelling? No. Pain Score  0 *  Have you tolerated food without any problems? Yes.    Have you been able to return to your normal activities? Yes.    Do you have any questions about your discharge instructions: Diet   No. Medications  No. Follow up visit  No.  Do you have questions or concerns about your Care? No.  Actions: * If pain score is 4 or above: No action needed, pain <4.

## 2018-02-03 ENCOUNTER — Encounter: Payer: Self-pay | Admitting: Internal Medicine

## 2018-02-17 DIAGNOSIS — M545 Low back pain: Secondary | ICD-10-CM | POA: Diagnosis not present

## 2018-02-17 DIAGNOSIS — N183 Chronic kidney disease, stage 3 (moderate): Secondary | ICD-10-CM | POA: Diagnosis not present

## 2018-02-17 DIAGNOSIS — G894 Chronic pain syndrome: Secondary | ICD-10-CM | POA: Diagnosis not present

## 2018-02-17 DIAGNOSIS — M5136 Other intervertebral disc degeneration, lumbar region: Secondary | ICD-10-CM | POA: Diagnosis not present

## 2018-03-05 HISTORY — PX: BACK SURGERY: SHX140

## 2018-03-10 DIAGNOSIS — M545 Low back pain: Secondary | ICD-10-CM | POA: Diagnosis not present

## 2018-03-12 DIAGNOSIS — N183 Chronic kidney disease, stage 3 (moderate): Secondary | ICD-10-CM | POA: Diagnosis not present

## 2018-03-13 DIAGNOSIS — M545 Low back pain: Secondary | ICD-10-CM | POA: Diagnosis not present

## 2018-03-17 DIAGNOSIS — M48061 Spinal stenosis, lumbar region without neurogenic claudication: Secondary | ICD-10-CM | POA: Diagnosis not present

## 2018-03-18 DIAGNOSIS — I129 Hypertensive chronic kidney disease with stage 1 through stage 4 chronic kidney disease, or unspecified chronic kidney disease: Secondary | ICD-10-CM | POA: Diagnosis not present

## 2018-03-18 DIAGNOSIS — E559 Vitamin D deficiency, unspecified: Secondary | ICD-10-CM | POA: Diagnosis not present

## 2018-03-18 DIAGNOSIS — D631 Anemia in chronic kidney disease: Secondary | ICD-10-CM | POA: Diagnosis not present

## 2018-03-18 DIAGNOSIS — N183 Chronic kidney disease, stage 3 (moderate): Secondary | ICD-10-CM | POA: Diagnosis not present

## 2018-03-27 ENCOUNTER — Other Ambulatory Visit: Payer: Self-pay | Admitting: Neurosurgery

## 2018-03-27 DIAGNOSIS — M5416 Radiculopathy, lumbar region: Secondary | ICD-10-CM | POA: Diagnosis not present

## 2018-03-27 DIAGNOSIS — M7130 Other bursal cyst, unspecified site: Secondary | ICD-10-CM | POA: Diagnosis not present

## 2018-03-27 DIAGNOSIS — M4310 Spondylolisthesis, site unspecified: Secondary | ICD-10-CM | POA: Diagnosis not present

## 2018-04-05 DIAGNOSIS — H5213 Myopia, bilateral: Secondary | ICD-10-CM | POA: Diagnosis not present

## 2018-04-07 ENCOUNTER — Other Ambulatory Visit: Payer: Self-pay

## 2018-04-07 ENCOUNTER — Encounter (HOSPITAL_COMMUNITY)
Admission: RE | Admit: 2018-04-07 | Discharge: 2018-04-07 | Disposition: A | Discharge status: Home / Self Care | Payer: BLUE CROSS/BLUE SHIELD | Source: Ambulatory Visit | Attending: Neurosurgery | Admitting: Neurosurgery

## 2018-04-07 ENCOUNTER — Encounter (HOSPITAL_COMMUNITY): Payer: Self-pay

## 2018-04-07 DIAGNOSIS — R001 Bradycardia, unspecified: Secondary | ICD-10-CM | POA: Diagnosis not present

## 2018-04-07 DIAGNOSIS — I517 Cardiomegaly: Secondary | ICD-10-CM | POA: Diagnosis not present

## 2018-04-07 DIAGNOSIS — R9431 Abnormal electrocardiogram [ECG] [EKG]: Secondary | ICD-10-CM | POA: Diagnosis not present

## 2018-04-07 DIAGNOSIS — I10 Essential (primary) hypertension: Secondary | ICD-10-CM | POA: Insufficient documentation

## 2018-04-07 DIAGNOSIS — Z01818 Encounter for other preprocedural examination: Secondary | ICD-10-CM | POA: Diagnosis not present

## 2018-04-07 LAB — CBC WITH DIFFERENTIAL/PLATELET
Abs Immature Granulocytes: 0.03 10*3/uL (ref 0.00–0.07)
Basophils Absolute: 0.1 10*3/uL (ref 0.0–0.1)
Basophils Relative: 1 %
Eosinophils Absolute: 0.3 10*3/uL (ref 0.0–0.5)
Eosinophils Relative: 3 %
HCT: 35.7 % — ABNORMAL LOW (ref 39.0–52.0)
Hemoglobin: 11.8 g/dL — ABNORMAL LOW (ref 13.0–17.0)
Immature Granulocytes: 0 %
Lymphocytes Relative: 19 %
Lymphs Abs: 1.6 10*3/uL (ref 0.7–4.0)
MCH: 29 pg (ref 26.0–34.0)
MCHC: 33.1 g/dL (ref 30.0–36.0)
MCV: 87.7 fL (ref 80.0–100.0)
Monocytes Absolute: 0.8 10*3/uL (ref 0.1–1.0)
Monocytes Relative: 10 %
Neutro Abs: 5.7 10*3/uL (ref 1.7–7.7)
Neutrophils Relative %: 67 %
Platelets: 289 10*3/uL (ref 150–400)
RBC: 4.07 MIL/uL — ABNORMAL LOW (ref 4.22–5.81)
RDW: 13.2 % (ref 11.5–15.5)
WBC: 8.5 10*3/uL (ref 4.0–10.5)
nRBC: 0 % (ref 0.0–0.2)

## 2018-04-07 LAB — BASIC METABOLIC PANEL
Anion gap: 13 (ref 5–15)
BUN: 26 mg/dL — ABNORMAL HIGH (ref 8–23)
CO2: 22 mmol/L (ref 22–32)
Calcium: 9.7 mg/dL (ref 8.9–10.3)
Chloride: 102 mmol/L (ref 98–111)
Creatinine, Ser: 1.91 mg/dL — ABNORMAL HIGH (ref 0.61–1.24)
GFR calc Af Amer: 41 mL/min — ABNORMAL LOW (ref >60)
GFR calc non Af Amer: 35 mL/min — ABNORMAL LOW (ref >60)
Glucose, Bld: 102 mg/dL — ABNORMAL HIGH (ref 70–99)
Potassium: 3.6 mmol/L (ref 3.5–5.1)
Sodium: 137 mmol/L (ref 135–145)

## 2018-04-07 LAB — TYPE AND SCREEN
ABO/RH(D): B POS
Antibody Screen: NEGATIVE

## 2018-04-07 LAB — SURGICAL PCR SCREEN
MRSA, PCR: NEGATIVE
Staphylococcus aureus: NEGATIVE

## 2018-04-07 LAB — ABO/RH: ABO/RH(D): B POS

## 2018-04-07 NOTE — Progress Notes (Signed)
PCP - Francesco Runner, MD Cardiologist -denies   Chest x-ray - N/A EKG - 04/07/18 Stress Test -denies  ECHO - denies Cardiac Cath - denies  Sleep Study - denies  Blood Thinner Instructions:N/A Aspirin Instructions:N/A  Anesthesia review: No  Patient denies shortness of breath, fever, cough and chest pain at PAT appointment   Patient verbalized understanding of instructions that were given to them at the PAT appointment. Patient was also instructed that they will need to review over the PAT instructions again at home before surgery.

## 2018-04-07 NOTE — Pre-Procedure Instructions (Signed)
William Perkins  04/07/2018      [facility] ([place]), [place] [address] [place] ([place]) [place] [address] Phone: [phone] Fax: [phone]    Your procedure is scheduled on Tuesday, February 10th.  Report to [facility] Admitting at 6:00 A.M.  Call this number if you have problems the morning of surgery:  [phone]   Remember:  Do not eat or drink after midnight.    Take these medicines the morning of surgery with A SIP OF WATER   amLODipine (NORVASC)  cloNIDine (CATAPRES) hydrALAZINE (APRESOLINE)   7 days prior to surgery (04/08/18) STOP taking any Aspirin (unless otherwise instructed by your surgeon), Aleve, Naproxen, Ibuprofen, Motrin, Advil, Goody's, BC's, all herbal medications, fish oil, and all vitamins.     Do not wear jewelry.  Do not wear lotions, powders, or colognes, or deodorant.  Men may shave face and neck.  Do not bring valuables to the hospital.  [facility] is not responsible for any belongings or valuables.  Contacts, dentures or bridgework may not be worn into surgery.  Leave your suitcase in the car.  After surgery it may be brought to your room.  For patients admitted to the hospital, discharge time will be determined by your treatment team.  Patients discharged the day of surgery will not be allowed to drive home.   Special instructions:   [place]- Preparing For Surgery  Before surgery, you can play an important role. Because skin is not sterile, your skin needs to be as free of germs as possible. You can reduce the number of germs on your skin by washing with CHG (chlorahexidine gluconate) Soap before surgery.  CHG is an antiseptic cleaner which kills germs and bonds with the skin to continue killing germs even after washing.    Oral Hygiene is also important to reduce your risk of infection.  Remember - BRUSH YOUR TEETH THE MORNING OF SURGERY WITH YOUR REGULAR TOOTHPASTE  Please do not  use if you have an allergy to CHG or antibacterial soaps. If your skin becomes reddened/irritated stop using the CHG.  Do not shave (including legs and underarms) for at least 48 hours prior to first CHG shower. It is OK to shave your face.  Please follow these instructions carefully.   1. Shower the NIGHT BEFORE SURGERY and the MORNING OF SURGERY with CHG.   2. If you chose to wash your hair, wash your hair first as usual with your normal shampoo.  3. After you shampoo, rinse your hair and body thoroughly to remove the shampoo.  4. Use CHG as you would any other liquid soap. You can apply CHG directly to the skin and wash gently with a scrungie or a clean washcloth.   5. Apply the CHG Soap to your body ONLY FROM THE NECK DOWN.  Do not use on open wounds or open sores. Avoid contact with your eyes, ears, mouth and genitals (private parts). Wash Face and genitals (private parts)  with your normal soap.  6. Wash thoroughly, paying special attention to the area where your surgery will be performed.  7. Thoroughly rinse your body with warm water from the neck down.  8. DO NOT shower/wash with your normal soap after using and rinsing off the CHG Soap.  9. Pat yourself dry with a CLEAN TOWEL.  10. Wear CLEAN PAJAMAS to bed the night before surgery, wear comfortable clothes the morning of surgery  11. Place CLEAN SHEETS on your bed the night of your first shower and DO NOT SLEEP WITH PETS.    Day of Surgery:   Shower as stated above.  Do not apply any deodorants/lotions.  Please wear clean clothes to the hospital/surgery center.   Remember to brush your teeth WITH YOUR REGULAR TOOTHPASTE.   Please read over the following fact sheets that you were given.

## 2018-04-08 NOTE — Anesthesia Preprocedure Evaluation (Addendum)
Anesthesia Evaluation  Patient identified by MRN, date of birth, ID band Patient awake    Reviewed: Allergy & Precautions, NPO status , Patient's Chart, lab work & pertinent test results  Airway Mallampati: II  TM Distance: >3 FB Neck ROM: Full    Dental  (+) Edentulous Upper, Edentulous Lower, Dental Advidsory Given   Pulmonary COPD, former smoker,    breath sounds clear to auscultation       Cardiovascular hypertension,  Rhythm:Regular Rate:Normal     Neuro/Psych  Neuromuscular disease    GI/Hepatic GERD  Medicated and Controlled,  Endo/Other    Renal/GU Renal disease     Musculoskeletal   Abdominal   Peds  Hematology   Anesthesia Other Findings   Reproductive/Obstetrics                           Anesthesia Physical Anesthesia Plan  ASA: III  Anesthesia Plan: General   Post-op Pain Management:    Induction: Intravenous  PONV Risk Score and Plan: Ondansetron and Dexamethasone  Airway Management Planned: Oral ETT  Additional Equipment:   Intra-op Plan:   Post-operative Plan: Extubation in OR  Informed Consent: I have reviewed the patients History and Physical, chart, labs and discussed the procedure including the risks, benefits and alternatives for the proposed anesthesia with the patient or authorized representative who has indicated his/her understanding and acceptance.     Dental Advisory Given  Plan Discussed with: CRNA and Anesthesiologist  Anesthesia Plan Comments: (Followed by Dr. Elmarie Shiley for stable CKD III. Baseline creatinine ~2.0.)      Anesthesia Quick Evaluation

## 2018-04-14 ENCOUNTER — Inpatient Hospital Stay (HOSPITAL_COMMUNITY)
Admission: RE | Admit: 2018-04-14 | Discharge: 2018-04-15 | DRG: 455 | Disposition: A | Discharge status: Home / Self Care | Payer: BLUE CROSS/BLUE SHIELD | Attending: Neurosurgery | Admitting: Neurosurgery

## 2018-04-14 ENCOUNTER — Inpatient Hospital Stay (HOSPITAL_COMMUNITY): Payer: BLUE CROSS/BLUE SHIELD | Admitting: Physician Assistant

## 2018-04-14 ENCOUNTER — Inpatient Hospital Stay (HOSPITAL_COMMUNITY): Payer: BLUE CROSS/BLUE SHIELD | Admitting: Anesthesiology

## 2018-04-14 ENCOUNTER — Encounter (HOSPITAL_COMMUNITY)
Admission: RE | Disposition: A | Discharge status: Home / Self Care | Payer: Self-pay | Source: Home / Self Care | Attending: Neurosurgery

## 2018-04-14 ENCOUNTER — Inpatient Hospital Stay (HOSPITAL_COMMUNITY): Payer: BLUE CROSS/BLUE SHIELD

## 2018-04-14 ENCOUNTER — Encounter (HOSPITAL_COMMUNITY): Payer: Self-pay | Admitting: Anesthesiology

## 2018-04-14 ENCOUNTER — Other Ambulatory Visit: Payer: Self-pay

## 2018-04-14 DIAGNOSIS — K219 Gastro-esophageal reflux disease without esophagitis: Secondary | ICD-10-CM | POA: Diagnosis not present

## 2018-04-14 DIAGNOSIS — M48061 Spinal stenosis, lumbar region without neurogenic claudication: Secondary | ICD-10-CM | POA: Diagnosis present

## 2018-04-14 DIAGNOSIS — M5116 Intervertebral disc disorders with radiculopathy, lumbar region: Secondary | ICD-10-CM | POA: Diagnosis not present

## 2018-04-14 DIAGNOSIS — E785 Hyperlipidemia, unspecified: Secondary | ICD-10-CM | POA: Diagnosis not present

## 2018-04-14 DIAGNOSIS — Z419 Encounter for procedure for purposes other than remedying health state, unspecified: Secondary | ICD-10-CM

## 2018-04-14 DIAGNOSIS — Z833 Family history of diabetes mellitus: Secondary | ICD-10-CM | POA: Diagnosis not present

## 2018-04-14 DIAGNOSIS — M4316 Spondylolisthesis, lumbar region: Principal | ICD-10-CM | POA: Diagnosis present

## 2018-04-14 DIAGNOSIS — M4326 Fusion of spine, lumbar region: Secondary | ICD-10-CM | POA: Diagnosis not present

## 2018-04-14 DIAGNOSIS — M7138 Other bursal cyst, other site: Secondary | ICD-10-CM | POA: Diagnosis present

## 2018-04-14 DIAGNOSIS — I10 Essential (primary) hypertension: Secondary | ICD-10-CM | POA: Diagnosis not present

## 2018-04-14 DIAGNOSIS — Z87891 Personal history of nicotine dependence: Secondary | ICD-10-CM | POA: Diagnosis not present

## 2018-04-14 DIAGNOSIS — M5416 Radiculopathy, lumbar region: Secondary | ICD-10-CM | POA: Diagnosis present

## 2018-04-14 DIAGNOSIS — J449 Chronic obstructive pulmonary disease, unspecified: Secondary | ICD-10-CM | POA: Diagnosis not present

## 2018-04-14 DIAGNOSIS — M4310 Spondylolisthesis, site unspecified: Secondary | ICD-10-CM

## 2018-04-14 DIAGNOSIS — Z79899 Other long term (current) drug therapy: Secondary | ICD-10-CM

## 2018-04-14 SURGERY — POSTERIOR LUMBAR FUSION 1 LEVEL
Anesthesia: General | Site: Back | Laterality: Bilateral

## 2018-04-14 MED ORDER — OXYCODONE HCL 5 MG PO TABS
ORAL_TABLET | ORAL | Status: AC
  Administered 2018-04-14: 5 mg via ORAL
  Filled 2018-04-14: qty 1

## 2018-04-14 MED ORDER — CHLORHEXIDINE GLUCONATE CLOTH 2 % EX PADS: 6.0000 | MEDICATED_PAD | Freq: Once | CUTANEOUS | Status: DC

## 2018-04-14 MED ORDER — HYDROMORPHONE HCL 1 MG/ML IJ SOLN: 1.0000 mg | INTRAMUSCULAR | Status: DC | PRN

## 2018-04-14 MED ORDER — LISINOPRIL 20 MG PO TABS
40.0000 mg | ORAL_TABLET | Freq: Every day | ORAL | Status: DC
  Administered 2018-04-14 – 2018-04-15 (×2): 40 mg via ORAL
  Filled 2018-04-14 (×2): qty 2

## 2018-04-14 MED ORDER — MIDAZOLAM HCL 2 MG/2ML IJ SOLN
INTRAMUSCULAR | Status: AC
  Filled 2018-04-14: qty 2

## 2018-04-14 MED ORDER — PROPOFOL 10 MG/ML IV BOLUS
INTRAVENOUS | Status: DC | PRN
  Administered 2018-04-14: 160 mg via INTRAVENOUS

## 2018-04-14 MED ORDER — CLONIDINE HCL 0.1 MG PO TABS
0.1000 mg | ORAL_TABLET | Freq: Two times a day (BID) | ORAL | Status: DC
  Administered 2018-04-14 – 2018-04-15 (×3): 0.1 mg via ORAL
  Filled 2018-04-14 (×3): qty 1

## 2018-04-14 MED ORDER — BISACODYL 10 MG RE SUPP: 10.0000 mg | Freq: Every day | RECTAL | Status: DC | PRN

## 2018-04-14 MED ORDER — PROPOFOL 10 MG/ML IV BOLUS
INTRAVENOUS | Status: AC
  Filled 2018-04-14: qty 20

## 2018-04-14 MED ORDER — ARTIFICIAL TEARS OPHTHALMIC OINT
TOPICAL_OINTMENT | OPHTHALMIC | Status: AC
  Filled 2018-04-14: qty 10.5

## 2018-04-14 MED ORDER — SODIUM CHLORIDE 0.9% FLUSH: 3.0000 mL | INTRAVENOUS | Status: DC | PRN

## 2018-04-14 MED ORDER — PHENOL 1.4 % MT LIQD: 1.0000 | OROMUCOSAL | Status: DC | PRN

## 2018-04-14 MED ORDER — FLEET ENEMA 7-19 GM/118ML RE ENEM: 1.0000 | ENEMA | Freq: Once | RECTAL | Status: DC | PRN

## 2018-04-14 MED ORDER — SODIUM CHLORIDE 0.9% FLUSH: 3.0000 mL | Freq: Two times a day (BID) | INTRAVENOUS | Status: DC

## 2018-04-14 MED ORDER — PHENYLEPHRINE HCL 10 MG/ML IJ SOLN
INTRAMUSCULAR | Status: DC | PRN
  Administered 2018-04-14 (×3): 80 ug via INTRAVENOUS

## 2018-04-14 MED ORDER — FENTANYL CITRATE (PF) 250 MCG/5ML IJ SOLN
INTRAMUSCULAR | Status: AC
  Filled 2018-04-14: qty 5

## 2018-04-14 MED ORDER — LIDOCAINE 2% (20 MG/ML) 5 ML SYRINGE
INTRAMUSCULAR | Status: DC | PRN
  Administered 2018-04-14: 60 mg via INTRAVENOUS

## 2018-04-14 MED ORDER — OXYCODONE HCL 5 MG/5ML PO SOLN: 5.0000 mg | Freq: Once | ORAL | Status: AC | PRN

## 2018-04-14 MED ORDER — ROCURONIUM BROMIDE 50 MG/5ML IV SOSY
PREFILLED_SYRINGE | INTRAVENOUS | Status: AC
  Filled 2018-04-14: qty 15

## 2018-04-14 MED ORDER — BUPIVACAINE HCL (PF) 0.25 % IJ SOLN
INTRAMUSCULAR | Status: DC | PRN
  Administered 2018-04-14: 30 mL

## 2018-04-14 MED ORDER — SODIUM CHLORIDE 0.9 % IV SOLN
INTRAVENOUS | Status: DC | PRN
  Administered 2018-04-14: 500 mL

## 2018-04-14 MED ORDER — POLYETHYLENE GLYCOL 3350 17 G PO PACK: 17.0000 g | PACK | Freq: Every day | ORAL | Status: DC | PRN

## 2018-04-14 MED ORDER — HYDRALAZINE HCL 50 MG PO TABS
100.0000 mg | ORAL_TABLET | Freq: Two times a day (BID) | ORAL | Status: DC
  Administered 2018-04-14 – 2018-04-15 (×2): 100 mg via ORAL
  Filled 2018-04-14 (×3): qty 2

## 2018-04-14 MED ORDER — ONDANSETRON HCL 4 MG/2ML IJ SOLN: 4.0000 mg | Freq: Four times a day (QID) | INTRAMUSCULAR | Status: DC | PRN

## 2018-04-14 MED ORDER — DIAZEPAM 5 MG PO TABS
ORAL_TABLET | ORAL | Status: AC
  Administered 2018-04-14: 5 mg via ORAL
  Filled 2018-04-14: qty 1

## 2018-04-14 MED ORDER — CEFAZOLIN SODIUM-DEXTROSE 1-4 GM/50ML-% IV SOLN
1.0000 g | Freq: Three times a day (TID) | INTRAVENOUS | Status: AC
  Administered 2018-04-14 (×2): 1 g via INTRAVENOUS
  Filled 2018-04-14 (×2): qty 50

## 2018-04-14 MED ORDER — THROMBIN 20000 UNITS EX SOLR
CUTANEOUS | Status: DC | PRN
  Administered 2018-04-14: 20 mL via TOPICAL

## 2018-04-14 MED ORDER — ARTIFICIAL TEARS OPHTHALMIC OINT
TOPICAL_OINTMENT | OPHTHALMIC | Status: AC
  Filled 2018-04-14: qty 7

## 2018-04-14 MED ORDER — OXYCODONE HCL 5 MG PO TABS
5.0000 mg | ORAL_TABLET | Freq: Once | ORAL | Status: AC | PRN
  Administered 2018-04-14: 5 mg via ORAL

## 2018-04-14 MED ORDER — SODIUM CHLORIDE 0.9 % IV SOLN: 250.0000 mL | INTRAVENOUS | Status: DC

## 2018-04-14 MED ORDER — DEXAMETHASONE SODIUM PHOSPHATE 10 MG/ML IJ SOLN
INTRAMUSCULAR | Status: AC
  Filled 2018-04-14: qty 1

## 2018-04-14 MED ORDER — SODIUM CHLORIDE 0.9 % IV SOLN
INTRAVENOUS | Status: DC
  Administered 2018-04-14: 07:00:00 via INTRAVENOUS

## 2018-04-14 MED ORDER — ONDANSETRON HCL 4 MG/2ML IJ SOLN
INTRAMUSCULAR | Status: AC
  Filled 2018-04-14: qty 2

## 2018-04-14 MED ORDER — ACETAMINOPHEN 325 MG PO TABS: 650.0000 mg | ORAL_TABLET | ORAL | Status: DC | PRN

## 2018-04-14 MED ORDER — HYDROCODONE-ACETAMINOPHEN 10-325 MG PO TABS
1.0000 | ORAL_TABLET | ORAL | Status: DC | PRN
  Administered 2018-04-14 – 2018-04-15 (×5): 1 via ORAL
  Filled 2018-04-14 (×5): qty 1

## 2018-04-14 MED ORDER — AMLODIPINE BESYLATE 5 MG PO TABS
10.0000 mg | ORAL_TABLET | Freq: Every day | ORAL | Status: DC
  Administered 2018-04-14 – 2018-04-15 (×2): 10 mg via ORAL
  Filled 2018-04-14 (×2): qty 2

## 2018-04-14 MED ORDER — OXYCODONE HCL 5 MG PO TABS: 10.0000 mg | ORAL_TABLET | ORAL | Status: DC | PRN

## 2018-04-14 MED ORDER — VANCOMYCIN HCL 1000 MG IV SOLR
INTRAVENOUS | Status: AC
  Filled 2018-04-14: qty 1000

## 2018-04-14 MED ORDER — ONDANSETRON HCL 4 MG PO TABS: 4.0000 mg | ORAL_TABLET | Freq: Four times a day (QID) | ORAL | Status: DC | PRN

## 2018-04-14 MED ORDER — LACTATED RINGERS IV SOLN
INTRAVENOUS | Status: DC | PRN
  Administered 2018-04-14 (×2): via INTRAVENOUS

## 2018-04-14 MED ORDER — THROMBIN 20000 UNITS EX SOLR
CUTANEOUS | Status: AC
  Filled 2018-04-14: qty 20000

## 2018-04-14 MED ORDER — LIDOCAINE 2% (20 MG/ML) 5 ML SYRINGE
INTRAMUSCULAR | Status: AC
  Filled 2018-04-14: qty 10

## 2018-04-14 MED ORDER — DEXAMETHASONE SODIUM PHOSPHATE 10 MG/ML IJ SOLN
10.0000 mg | INTRAMUSCULAR | Status: AC
  Administered 2018-04-14: 10 mg via INTRAVENOUS
  Filled 2018-04-14: qty 1

## 2018-04-14 MED ORDER — CEFAZOLIN SODIUM-DEXTROSE 2-4 GM/100ML-% IV SOLN
2.0000 g | INTRAVENOUS | Status: AC
  Administered 2018-04-14: 2 g via INTRAVENOUS
  Filled 2018-04-14: qty 100

## 2018-04-14 MED ORDER — FENTANYL CITRATE (PF) 100 MCG/2ML IJ SOLN
25.0000 ug | INTRAMUSCULAR | Status: DC | PRN
  Administered 2018-04-14 (×2): 50 ug via INTRAVENOUS

## 2018-04-14 MED ORDER — ARTIFICIAL TEARS OPHTHALMIC OINT
TOPICAL_OINTMENT | OPHTHALMIC | Status: DC | PRN
  Administered 2018-04-14: 1 via OPHTHALMIC

## 2018-04-14 MED ORDER — FENTANYL CITRATE (PF) 100 MCG/2ML IJ SOLN
INTRAMUSCULAR | Status: DC | PRN
  Administered 2018-04-14: 25 ug via INTRAVENOUS
  Administered 2018-04-14: 100 ug via INTRAVENOUS
  Administered 2018-04-14: 25 ug via INTRAVENOUS

## 2018-04-14 MED ORDER — BUPIVACAINE HCL (PF) 0.25 % IJ SOLN
INTRAMUSCULAR | Status: AC
  Filled 2018-04-14: qty 30

## 2018-04-14 MED ORDER — PHENYLEPHRINE 40 MCG/ML (10ML) SYRINGE FOR IV PUSH (FOR BLOOD PRESSURE SUPPORT)
PREFILLED_SYRINGE | INTRAVENOUS | Status: AC
  Filled 2018-04-14: qty 10

## 2018-04-14 MED ORDER — ONDANSETRON HCL 4 MG/2ML IJ SOLN
INTRAMUSCULAR | Status: DC | PRN
  Administered 2018-04-14: 4 mg via INTRAVENOUS

## 2018-04-14 MED ORDER — ROCURONIUM BROMIDE 50 MG/5ML IV SOSY
PREFILLED_SYRINGE | INTRAVENOUS | Status: DC | PRN
  Administered 2018-04-14: 10 mg via INTRAVENOUS
  Administered 2018-04-14: 50 mg via INTRAVENOUS

## 2018-04-14 MED ORDER — EPHEDRINE 5 MG/ML INJ
INTRAVENOUS | Status: AC
  Filled 2018-04-14: qty 10

## 2018-04-14 MED ORDER — FENTANYL CITRATE (PF) 100 MCG/2ML IJ SOLN
INTRAMUSCULAR | Status: AC
  Administered 2018-04-14: 50 ug via INTRAVENOUS
  Filled 2018-04-14: qty 2

## 2018-04-14 MED ORDER — EPHEDRINE SULFATE 50 MG/ML IJ SOLN
INTRAMUSCULAR | Status: DC | PRN
  Administered 2018-04-14 (×2): 5 mg via INTRAVENOUS
  Administered 2018-04-14: 10 mg via INTRAVENOUS

## 2018-04-14 MED ORDER — VANCOMYCIN HCL 1 G IV SOLR
INTRAVENOUS | Status: DC | PRN
  Administered 2018-04-14: 1000 mg

## 2018-04-14 MED ORDER — ONDANSETRON HCL 4 MG/2ML IJ SOLN: 4.0000 mg | Freq: Once | INTRAMUSCULAR | Status: DC | PRN

## 2018-04-14 MED ORDER — 0.9 % SODIUM CHLORIDE (POUR BTL) OPTIME
TOPICAL | Status: DC | PRN
  Administered 2018-04-14: 1000 mL

## 2018-04-14 MED ORDER — PRAVASTATIN SODIUM 40 MG PO TABS
40.0000 mg | ORAL_TABLET | Freq: Every evening | ORAL | Status: DC
  Administered 2018-04-14: 40 mg via ORAL
  Filled 2018-04-14: qty 1

## 2018-04-14 MED ORDER — MIDAZOLAM HCL 5 MG/5ML IJ SOLN
INTRAMUSCULAR | Status: DC | PRN
  Administered 2018-04-14: 2 mg via INTRAVENOUS

## 2018-04-14 MED ORDER — THROMBIN 5000 UNITS EX SOLR
CUTANEOUS | Status: AC
  Filled 2018-04-14: qty 5000

## 2018-04-14 MED ORDER — ACETAMINOPHEN 650 MG RE SUPP: 650.0000 mg | RECTAL | Status: DC | PRN

## 2018-04-14 MED ORDER — SUGAMMADEX SODIUM 200 MG/2ML IV SOLN
INTRAVENOUS | Status: DC | PRN
  Administered 2018-04-14: 200 mg via INTRAVENOUS

## 2018-04-14 MED ORDER — DIAZEPAM 5 MG PO TABS
5.0000 mg | ORAL_TABLET | Freq: Four times a day (QID) | ORAL | Status: DC | PRN
  Administered 2018-04-14 – 2018-04-15 (×3): 5 mg via ORAL
  Filled 2018-04-14 (×2): qty 1

## 2018-04-14 MED ORDER — MENTHOL 3 MG MT LOZG: 1.0000 | LOZENGE | OROMUCOSAL | Status: DC | PRN

## 2018-04-14 SURGICAL SUPPLY — 68 items
ADH SKN CLS APL DERMABOND .7 (GAUZE/BANDAGES/DRESSINGS) ×1
APL SKNCLS STERI-STRIP NONHPOA (GAUZE/BANDAGES/DRESSINGS) ×1
BAG DECANTER FOR FLEXI CONT (MISCELLANEOUS) ×2 IMPLANT
BENZOIN TINCTURE PRP APPL 2/3 (GAUZE/BANDAGES/DRESSINGS) ×2 IMPLANT
BLADE CLIPPER SURG (BLADE) IMPLANT
BUR CUTTER 7.0 ROUND (BURR) IMPLANT
BUR MATCHSTICK NEURO 3.0 LAGG (BURR) ×2 IMPLANT
CANISTER SUCT 3000ML PPV (MISCELLANEOUS) ×2 IMPLANT
CAP LCK SPNE (Orthopedic Implant) ×4 IMPLANT
CAP LOCK SPINE RADIUS (Orthopedic Implant) IMPLANT
CAP LOCKING (Orthopedic Implant) ×8 IMPLANT
CARTRIDGE OIL MAESTRO DRILL (MISCELLANEOUS) ×1 IMPLANT
CLSR STERI-STRIP ANTIMIC 1/2X4 (GAUZE/BANDAGES/DRESSINGS) ×1 IMPLANT
CONT SPEC 4OZ CLIKSEAL STRL BL (MISCELLANEOUS) ×2 IMPLANT
COVER BACK TABLE 60X90IN (DRAPES) ×2 IMPLANT
COVER WAND RF STERILE (DRAPES) ×2 IMPLANT
DECANTER SPIKE VIAL GLASS SM (MISCELLANEOUS) ×2 IMPLANT
DERMABOND ADVANCED (GAUZE/BANDAGES/DRESSINGS) ×1
DERMABOND ADVANCED .7 DNX12 (GAUZE/BANDAGES/DRESSINGS) ×1 IMPLANT
DEVICE INTERBODY ELEVATE 10X23 (Cage) ×2 IMPLANT
DIFFUSER DRILL AIR PNEUMATIC (MISCELLANEOUS) ×2 IMPLANT
DRAPE C-ARM 42X72 X-RAY (DRAPES) ×4 IMPLANT
DRAPE HALF SHEET 40X57 (DRAPES) ×1 IMPLANT
DRAPE LAPAROTOMY 100X72X124 (DRAPES) ×2 IMPLANT
DRAPE SURG 17X23 STRL (DRAPES) ×8 IMPLANT
DRSG OPSITE POSTOP 4X6 (GAUZE/BANDAGES/DRESSINGS) ×2 IMPLANT
DURAPREP 26ML APPLICATOR (WOUND CARE) ×2 IMPLANT
ELECT REM PT RETURN 9FT ADLT (ELECTROSURGICAL) ×2
ELECTRODE REM PT RTRN 9FT ADLT (ELECTROSURGICAL) ×1 IMPLANT
EVACUATOR 1/8 PVC DRAIN (DRAIN) IMPLANT
GAUZE 4X4 16PLY RFD (DISPOSABLE) IMPLANT
GAUZE SPONGE 4X4 12PLY STRL (GAUZE/BANDAGES/DRESSINGS) IMPLANT
GLOVE BIO SURGEON STRL SZ 6.5 (GLOVE) ×1 IMPLANT
GLOVE BIOGEL PI IND STRL 6.5 (GLOVE) IMPLANT
GLOVE BIOGEL PI IND STRL 7.0 (GLOVE) IMPLANT
GLOVE BIOGEL PI IND STRL 7.5 (GLOVE) IMPLANT
GLOVE BIOGEL PI INDICATOR 6.5 (GLOVE) ×1
GLOVE BIOGEL PI INDICATOR 7.0 (GLOVE) ×5
GLOVE BIOGEL PI INDICATOR 7.5 (GLOVE) ×2
GLOVE ECLIPSE 9.0 STRL (GLOVE) ×4 IMPLANT
GLOVE EXAM NITRILE XL STR (GLOVE) IMPLANT
GOWN STRL REUS W/ TWL LRG LVL3 (GOWN DISPOSABLE) IMPLANT
GOWN STRL REUS W/ TWL XL LVL3 (GOWN DISPOSABLE) ×2 IMPLANT
GOWN STRL REUS W/TWL 2XL LVL3 (GOWN DISPOSABLE) IMPLANT
GOWN STRL REUS W/TWL LRG LVL3 (GOWN DISPOSABLE) ×2
GOWN STRL REUS W/TWL XL LVL3 (GOWN DISPOSABLE) ×6
KIT BASIN OR (CUSTOM PROCEDURE TRAY) ×2 IMPLANT
KIT TURNOVER KIT B (KITS) ×2 IMPLANT
MILL MEDIUM DISP (BLADE) ×2 IMPLANT
NEEDLE HYPO 22GX1.5 SAFETY (NEEDLE) ×2 IMPLANT
NS IRRIG 1000ML POUR BTL (IV SOLUTION) ×2 IMPLANT
OIL CARTRIDGE MAESTRO DRILL (MISCELLANEOUS) ×2
PACK LAMINECTOMY NEURO (CUSTOM PROCEDURE TRAY) ×2 IMPLANT
ROD RADIUS 35MM (Rod) ×1 IMPLANT
ROD RADIUS 40MM (Neuro Prosthesis/Implant) ×2 IMPLANT
ROD SPNL 40X5.5XNS TI RDS (Neuro Prosthesis/Implant) IMPLANT
SCREW 5.75X40M (Screw) ×2 IMPLANT
SCREW 5.75X45MM (Screw) ×2 IMPLANT
SPONGE SURGIFOAM ABS GEL 100 (HEMOSTASIS) ×2 IMPLANT
STRIP CLOSURE SKIN 1/2X4 (GAUZE/BANDAGES/DRESSINGS) ×4 IMPLANT
SUT VIC AB 0 CT1 18XCR BRD8 (SUTURE) ×2 IMPLANT
SUT VIC AB 0 CT1 8-18 (SUTURE) ×2
SUT VIC AB 2-0 CT1 18 (SUTURE) ×2 IMPLANT
SUT VIC AB 3-0 SH 8-18 (SUTURE) ×3 IMPLANT
TOWEL GREEN STERILE (TOWEL DISPOSABLE) ×2 IMPLANT
TOWEL GREEN STERILE FF (TOWEL DISPOSABLE) ×2 IMPLANT
TRAY FOLEY MTR SLVR 16FR STAT (SET/KITS/TRAYS/PACK) ×2 IMPLANT
WATER STERILE IRR 1000ML POUR (IV SOLUTION) ×2 IMPLANT

## 2018-04-14 NOTE — Anesthesia Postprocedure Evaluation (Signed)
Anesthesia Post Note  Patient: William Perkins  Procedure(s) Performed: POSTERIOR LUMBAR INTERBODY FUSION-LUMBAR FOUR-LUMBAR FIVE, Laminectomy for facet/synovial cyst - LUMBAR FOUR-LUMBAR FIVE - bilateral (Bilateral Back)     Patient location during evaluation: PACU Anesthesia Type: General Level of consciousness: awake and alert Pain management: pain level controlled Vital Signs Assessment: post-procedure vital signs reviewed and stable Respiratory status: spontaneous breathing, nonlabored ventilation, respiratory function stable and patient connected to nasal cannula oxygen Cardiovascular status: blood pressure returned to baseline and stable Postop Assessment: no apparent nausea or vomiting Anesthetic complications: no    Last Vitals:  Vitals:   04/14/18 1238 04/14/18 1540  BP: (!) 152/72 (!) 168/80  Pulse: 69 80  Resp: 18 19  Temp: 36.6 C 36.4 C  SpO2: 100% 100%    Last Pain:  Vitals:   04/14/18 1540  TempSrc: Oral  PainSc:                  William Perkins

## 2018-04-14 NOTE — Anesthesia Procedure Notes (Signed)
Procedure Name: Intubation Date/Time: 04/14/2018 8:13 AM Performed by: Neldon Newport, CRNA Pre-anesthesia Checklist: Timeout performed, Patient being monitored, Suction available, Emergency Drugs available and Patient identified Patient Re-evaluated:Patient Re-evaluated prior to induction Oxygen Delivery Method: Circle system utilized Preoxygenation: Pre-oxygenation with 100% oxygen Induction Type: IV induction Ventilation: Mask ventilation without difficulty Laryngoscope Size: Mac and 4 Grade View: Grade I Tube type: Oral Tube size: 7.5 mm Number of attempts: 1 Placement Confirmation: breath sounds checked- equal and bilateral,  positive ETCO2 and ETT inserted through vocal cords under direct vision Secured at: 23 cm Tube secured with: Tape Dental Injury: Teeth and Oropharynx as per pre-operative assessment

## 2018-04-14 NOTE — H&P (Signed)
William Perkins is an 68 y.o. male.   Chief Complaint: Pain HPI: 68 year old male with progressive back and bilateral lower extremity pain paresthesias and weakness failing conservative management her work-up demonstrates evidence of of early grade 1 degenerative spondylolisthesis at L4-5 with marked facet arthropathy and associated facet joint synovial cyst causing critical stenosis at L4-5.  Patient presents now for decompression, resection of cyst, and fusion in hopes of improving his symptoms.  Past Medical History:  Diagnosis Date  . Chronic renal insufficiency 03/21/2006   CrCl <50 ml  . DDD (degenerative disc disease), cervical   . GERD (gastroesophageal reflux disease) 03/22/2006  . Hand numbness 10/07/2009  . Hyperlipidemia 03/22/2008  . Hypertension   . Hypertension 03/22/2006  . Left ventricular hypertrophy 03/22/2006  . Leg pain, left 11/15/2009  . Lumbar strain 07/15/2008  . Renal insufficiency   . Soft tissue disorder 08/2008  . Tobacco abuse 03/22/2006   2 ppd x 30 years    Past Surgical History:  Procedure Laterality Date  . COLONOSCOPY    . WISDOM TOOTH EXTRACTION      Family History  Problem Relation Age of Onset  . Diabetes Mother   . Diabetes Sister   . Diabetes Brother   . Heart disease Brother   . Rectal cancer Neg Hx   . Stomach cancer Neg Hx   . Colon cancer Neg Hx    Social History:  reports that he quit smoking about 4 years ago. His smoking use included cigarettes. He has a 44.00 pack-year smoking history. He has never used smokeless tobacco. He reports that he does not drink alcohol or use drugs.  Allergies: No Known Allergies  Medications Prior to Admission  Medication Sig Dispense Refill  . amLODipine (NORVASC) 10 MG tablet Take 1 tablet (10 mg total) by mouth daily. 30 tablet 11  . cloNIDine (CATAPRES) 0.1 MG tablet Take 1 tablet (0.1 mg total) by mouth 2 (two) times daily. 60 tablet 11  . hydrALAZINE (APRESOLINE) 100 MG tablet Take 1  tablet (100 mg total) by mouth 2 (two) times daily. 90 tablet 3  . lisinopril (PRINIVIL,ZESTRIL) 40 MG tablet Take 1 tablet (40 mg total) by mouth daily. 30 tablet 11  . pravastatin (PRAVACHOL) 40 MG tablet Take 1 tablet (40 mg total) by mouth every evening. 30 tablet 11    No results found for this or any previous visit (from the past 48 hour(s)). No results found.  Pertinent items noted in HPI and remainder of comprehensive ROS otherwise negative.  Blood pressure (!) 168/74, pulse 66, temperature 97.6 F (36.4 C), temperature source Oral, resp. rate 18, height 5\' 11"  (1.803 m), weight 67.6 kg, SpO2 99 %.  Patient is awake and alert.  He is oriented and appropriate.  Speech is fluent.  Judgment insight are intact.  Cranial nerve function normal bilateral.  Motor examination extremities reveals weakness of dorsiflexion bilaterally right greater than left.  Patient with decreased sensation appearing light touch in his L5 and S1 dermatomes bilateral.  Deep tendon orifices normal active step his Achilles reflexes are absent bilaterally.  No evidence of long track signs.  Gait antalgic.  Posture flexed peer examination head ears eyes nose throat is unremarked.  Chest and abdomen are benign.  Extremities are free from injury or deformity. Assessment/Plan L4-5 grade 1 degenerative spondylolisthesis with associated severe stenosis complicated by facet joint synovial cysts.  Plan bilateral L4-5 decompressive laminotomies with resection of synovial cyst followed by posterior lumbar interbody fusion  utilizing interbody cages, locally harvested autograft, and augmented with posterior lateral arthrodesis utilizing nonsegmental pedicle screw fixation and local autografting.  Risks and benefits of been explained.  Patient wishes to proceed.  Mallie Mussel A Damari Suastegui 04/14/2018, 7:52 AM

## 2018-04-14 NOTE — Evaluation (Signed)
Occupational Therapy Evaluation Patient Details Name: William Perkins MRN: [id number] DOB: 1950-11-27 Today's Date: 04/14/2018    History of Present Illness  60 male s/p L4-5 fusion on 04/14/2018. PMH including HTN, DDD cervical, and left ventricular hypertrophy.    Clinical Impression   PTA, pt was living with his wife and was independent. Currently, pt requires Min Guard A for ADLs and Min Guard-Min A for functional mobility using RW. Provided education and handout on back precautions, bed mobility, brace management, LB ADLs, and functional transfers; pt demonstrated understanding. Pt will need further acute OT to address safe tub transfer and facilitate safe dc. Recommend dc home once medically stable per physician.     Follow Up Recommendations  No OT follow up;Supervision/Assistance - 24 hour    Equipment Recommendations  3 in 1 bedside commode    Recommendations for Other Services PT consult     Precautions / Restrictions Precautions Precautions: Back Precaution Booklet Issued: Yes (comment) Precaution Comments: Educating pt on back precautions and compensatory techniques for ADLs Required Braces or Orthoses: Spinal Brace Spinal Brace: Lumbar corset;Applied in sitting position Restrictions Weight Bearing Restrictions: No      Mobility Bed Mobility Overal bed mobility: Needs Assistance Bed Mobility: Rolling;Sidelying to Sit;Sit to Sidelying Rolling: Min guard Sidelying to sit: Min guard     Sit to sidelying: Min guard General bed mobility comments: Min Guard A for safety and cues for sequencing log roll  Transfers Overall transfer level: Needs assistance Equipment used: Standard walker;None Transfers: Sit to/from Stand Sit to Stand: Min guard;Min assist;From elevated surface         General transfer comment: Initial sit<>stand requiring Min A due to BLEs weakness and fatigue. Min Guard A for remainting transfers at EOB.    Balance Overall balance assessment:  Needs assistance Sitting-balance support: No upper extremity supported;Feet supported Sitting balance-Leahy Scale: Good     Standing balance support: No upper extremity supported;During functional activity Standing balance-Leahy Scale: Poor Standing balance comment: Reliant on UE support and physical A                           ADL either performed or assessed with clinical judgement   ADL Overall ADL's : Needs assistance/impaired Eating/Feeding: Set up;Sitting   Grooming: Set up;Sitting   Upper Body Bathing: Min guard;Sitting   Lower Body Bathing: Min guard;Sit to/from stand   Upper Body Dressing : Min guard;Sitting Upper Body Dressing Details (indicate cue type and reason): Providing education on brace management. Pt donning/doffing brace with Min cues for sequencing. Min guard A for safety Lower Body Dressing: Min guard;Sit to/from stand Lower Body Dressing Details (indicate cue type and reason): Educating pt on compensatory techniques for LB dressing. Pt donning underwear with Min Guard A. Toilet Transfer: Min guard;Minimal assistance;Ambulation;RW(simulated in room) Toilet Transfer Details (indicate cue type and reason): Min A for initial sit<>stand due to decreased strength in BLEs. Remaining Min Guard A for safety   Toileting - Clothing Manipulation Details (indicate cue type and reason): Will need further education on toilet hygiene   Tub/Shower Transfer Details (indicate cue type and reason): Initiated education on tub transfers with 3N1. Will need further education and practice Functional mobility during ADLs: Minimal assistance;Rolling walker;Min guard General ADL Comments: Providing pt with education on back precautions, brace management, LB ADLs, and functional transfers.      Vision Baseline Vision/History: Wears glasses Wears Glasses: At all times Patient Visual Report: No change  from baseline       Perception     Praxis      Pertinent  Vitals/Pain Pain Assessment: 0-10 Pain Score: 5  Pain Location: Back Pain Descriptors / Indicators: Constant;Discomfort Pain Intervention(s): Monitored during session;Limited activity within patient's tolerance;Repositioned     Hand Dominance Right   Extremity/Trunk Assessment Upper Extremity Assessment Upper Extremity Assessment: Overall WFL for tasks assessed   Lower Extremity Assessment Lower Extremity Assessment: Defer to PT evaluation   Cervical / Trunk Assessment Cervical / Trunk Assessment: Other exceptions Cervical / Trunk Exceptions: s/p back sx   Communication Communication Communication: No difficulties   Cognition Arousal/Alertness: Awake/alert Behavior During Therapy: WFL for tasks assessed/performed Overall Cognitive Status: Within Functional Limits for tasks assessed                                 General Comments: Requiring some additional cues for problem solving.    General Comments  Pt's wife present throughout session    Exercises     Shoulder Instructions      Annabella expects to be discharged to:: Private residence Living Arrangements: Spouse/significant other Available Help at Discharge: Family;Available 24 hours/day Type of Home: House             Bathroom Shower/Tub: Teacher, early years/pre: Standard     Home Equipment: None          Prior Functioning/Environment Level of Independence: Independent        Comments: Works full time        OT Problem List: Decreased strength;Decreased range of motion;Decreased activity tolerance;Impaired balance (sitting and/or standing);Decreased knowledge of use of DME or AE;Decreased knowledge of precautions;Pain      OT Treatment/Interventions: Self-care/ADL training;Therapeutic exercise;Energy conservation;DME and/or AE instruction;Therapeutic activities;Patient/family education    OT Goals(Current goals can be found in the care plan section)  Acute Rehab OT Goals Patient Stated Goal: "recover well" OT Goal Formulation: With patient/family Time For Goal Achievement: 04/28/18 Potential to Achieve Goals: Good ADL Goals Pt Will Perform Lower Body Dressing: with set-up;with supervision;sit to/from stand Pt Will Transfer to Toilet: with set-up;with supervision;bedside commode;ambulating Pt Will Perform Toileting - Clothing Manipulation and hygiene: with set-up;with supervision;sit to/from stand;sitting/lateral leans Pt Will Perform Tub/Shower Transfer: Tub transfer;with set-up;with supervision;3 in 1;ambulating(with or without RW) Additional ADL Goal #1: Pt will perform bed mobility using log roll technique with supervision Additional ADL Goal #2: Pt will verbalize 3/3 back precautions independently  OT Frequency: Min 2X/week   Barriers to D/C:            Co-evaluation              AM-PAC OT "6 Clicks" Daily Activity     Outcome Measure Help from another person eating meals?: None Help from another person taking care of personal grooming?: A Little Help from another person toileting, which includes using toliet, bedpan, or urinal?: A Little Help from another person bathing (including washing, rinsing, drying)?: A Little Help from another person to put on and taking off regular upper body clothing?: A Little Help from another person to put on and taking off regular lower body clothing?: A Little 6 Click Score: 19   End of Session Equipment Utilized During Treatment: Rolling walker;Back brace Nurse Communication: Mobility status;Precautions  Activity Tolerance: Patient tolerated treatment well;Patient limited by fatigue;Patient limited by pain Patient left: in bed;with call bell/phone within reach;with family/visitor  present;with SCD's reapplied  OT Visit Diagnosis: Unsteadiness on feet (R26.81);Other abnormalities of gait and mobility (R26.89);Muscle weakness (generalized) (M62.81);Pain Pain - part of body: (Back)                 Time: 4830-7354 OT Time Calculation (min): 20 min Charges:  OT General Charges $OT Visit: 1 Visit OT Evaluation $OT Eval Low Complexity: 1 Low  Lala Been MSOT, OTR/L Acute Rehab Pager: [phone] Office: [place] 04/14/2018, 4:37 PM

## 2018-04-14 NOTE — Op Note (Signed)
Date of procedure: 04/14/2018  Date of dictation: Same  Service: Neurosurgery  Preoperative diagnosis: Grade 1 L4-5 degenerative spondylolisthesis  Bilateral L4-5 adherent synovial cyst with radiculopathy  Postoperative diagnosis: Same  Procedure Name: Bilateral L4-5 decompressive laminotomies and facetectomies with resection of adherent synovial cyst requiring microdissection  L4-5 posterior lumbar interbody fusion utilizing interbody cages and locally harvested autograft  L4-5 Ponte osteotomies for restoration of sagittal plane balance  L4-5 posterior lateral arthrodesis utilizing nonsegmental pedicle screw fixation and local autograft  Surgeon:Garcia Dalzell A.[place] Mckeough, M.D.  Asst. Surgeon: Reinaldo Meeker, NP  Anesthesia: General  Indication: Patient is a 68 year old male with severe back and bilateral lower extremity symptoms failing conservative management.  Work-up demonstrates evidence of an early grade 1 degenerative spondylolisthesis with flattening of his lumbar curve.  Patient also found to have significant bilateral synovial cysts left greater than right which are causing severe compression upon the thecal sac and exiting nerve roots.  Patient presents now for decompression, resection of cyst, and then fusion.  Operative note: After induction of anesthesia, patient position prone onto Wilson frame and appropriate padded.  Lumbar region prepped and draped sterilely.  Incision made overlying L4-5.  Dissection performed bilaterally.  Retractor placed.  Fluoroscopy used.  Levels confirmed.  Decompressive laminotomies and facetectomies were then performed using Leksell rongeurs Kerrison Rogers and high-speed drill to remove the inferior two thirds of the lamina of L4 the entire inferior facet and pars interarticularis of L4 bilaterally, the majority the superior facet of L5 bilaterally.  Ligament flavum elevated and resected.  Adherent synovial cyst was encountered bilaterally much larger on the left  side.  This is dissected free using micro dissection.  Elements of the cyst wall were completely resected from the underlying thecal sac and nerve roots.  There was no evidence of injury to the thecal sac or nerve roots.  Ponte osteotomies were then completed at L4-5 so that his deformity could be fully reduced.  Bilateral discectomies then performed at L4-5.  The spaces were then prepared for interbody fusion.  Soft tissue removed in the interspace.  With the disc space distracted on the patient's right side a 10 mm [employer] expandable cage packed with locally harvested autograft was then impacted into place and expanded to its full extent.  Distractor removed patient's right side.  The space prepared on the right side.  Morselized autograft packed in the interspace.  Second cage packed with autograft was then impacted into place and expanded to its full extent.  Pedicles at L4 and L5 were identified using surface landmarks and intraoperative fluoroscopy.  Superficial bone by the pedicle was then removed using high-speed drill.  Pedicle was then probed using a pedicle all each pedicle tract was then probed and found to be solidly within the bone.  Each pedicle tract was then tapped with a screw tapped and then once again probed to confirm good position.  5.75 mm radius brand screws from [employer] were placed bilaterally at L4 and L5.  Final images reveal good position of the cages and the hardware at the proper upper level with normal alignment of the spine.  Wound was then irrigated one final time.  Short segment titanium rod placed over the screw heads at L4 and L5.  Locking caps placed over the screws.  Locking caps then engaged with a construct under mild compression.  Transverse processes and residual facets were decorticated.  Morselized autograft was packed posterior laterally.  Gelfoam was placed over the laminotomy defects.  Vancomycin powder  was placed in the deep wound space.  Wounds and closed in  layers with Vicryl sutures.  Steri-Strips and sterile dressing were applied.  No apparent complications.  Patient tolerated the procedure well and he returns to the recovery room postop.

## 2018-04-14 NOTE — Transfer of Care (Signed)
Immediate Anesthesia Transfer of Care Note  Patient: William Perkins  Procedure(s) Performed: POSTERIOR LUMBAR INTERBODY FUSION-LUMBAR FOUR-LUMBAR FIVE, Laminectomy for facet/synovial cyst - LUMBAR FOUR-LUMBAR FIVE - bilateral (Bilateral Back)  Patient Location: PACU  Anesthesia Type:General  Level of Consciousness: awake and alert   Airway & Oxygen Therapy: Patient Spontanous Breathing and Patient connected to nasal cannula oxygen  Post-op Assessment: Report given to RN, Post -op Vital signs reviewed and stable and Patient moving all extremities X 4  Post vital signs: Reviewed and stable  Last Vitals:  Vitals Value Taken Time  BP    Temp    Pulse 76 04/14/2018 10:46 AM  Resp 13 04/14/2018 10:46 AM  SpO2 100 % 04/14/2018 10:46 AM  Vitals shown include unvalidated device data.  Last Pain:  Vitals:   04/14/18 0638  TempSrc:   PainSc: 6       Patients Stated Pain Goal: 2 (18/33/58 2518)  Complications: No apparent anesthesia complications

## 2018-04-14 NOTE — Progress Notes (Signed)
Orthopedic Tech Progress Note Patient Details:  William Perkins 07-27-1950 [id number] PACU RN said patient has brace  Patient ID: William Perkins, male   DOB: 1950-03-08, 69 y.o.   MRN: [id number]   William Perkins 04/14/2018, 10:49 AM

## 2018-04-14 NOTE — Brief Op Note (Signed)
04/14/2018  10:28 AM  PATIENT:  William Perkins  68 y.o. male  PRE-OPERATIVE DIAGNOSIS:  Synovial cyst  POST-OPERATIVE DIAGNOSIS:  Synovial cyst  PROCEDURE:  Procedure(s): POSTERIOR LUMBAR INTERBODY FUSION-LUMBAR FOUR-LUMBAR FIVE, Laminectomy for facet/synovial cyst - LUMBAR FOUR-LUMBAR FIVE - bilateral (Bilateral)  SURGEON:  Surgeon(s) and Role:    * Earnie Larsson, MD - Primary    * Ostergard, Joyice Faster, MD - Assisting  PHYSICIAN ASSISTANT:   ASSISTANTSReinaldo Meeker, NP  ANESTHESIA:   general  EBL:  100 mL   BLOOD ADMINISTERED:none  DRAINS: none   LOCAL MEDICATIONS USED:  MARCAINE     SPECIMEN:  No Specimen  DISPOSITION OF SPECIMEN:  N/A  COUNTS:  YES  TOURNIQUET:  * No tourniquets in log *  DICTATION: .Dragon Dictation  PLAN OF CARE: Admit to inpatient   PATIENT DISPOSITION:  PACU - hemodynamically stable.   Delay start of Pharmacological VTE agent (>24hrs) due to surgical blood loss or risk of bleeding: yes

## 2018-04-15 MED ORDER — CYCLOBENZAPRINE HCL 10 MG PO TABS: 10.0000 mg | ORAL_TABLET | Freq: Three times a day (TID) | ORAL | 0 refills | Status: DC | PRN

## 2018-04-15 MED ORDER — HYDROCODONE-ACETAMINOPHEN 10-325 MG PO TABS: 1.0000 | ORAL_TABLET | ORAL | 0 refills | Status: DC | PRN

## 2018-04-15 NOTE — Progress Notes (Signed)
Occupational Therapy Treatment Patient Details Name: William Perkins MRN: [id number] DOB: 05-Apr-1950 Today's Date: 04/15/2018    History of present illness 70 male s/p L4-5 fusion on 04/14/2018. PMH including HTN, DDD cervical, and left ventricular hypertrophy.    OT comments  Pt slowly progressing towards established OT goals. Continues to present with decreased problem solving and memory; feel this is baseline. Educating pt on safe tub transfer techniques and pt performing transfer with [employer] A, RW, 3N1, and Max cues for sequencing. Reviewed all education on back precautions, bed mobility, brace management, and LB ADLs. Update dc recommendation to home with HHOT to optimize safety, independence with ADLs, and return to PLOF. Will continue to follow acutely as admitted.    Follow Up Recommendations  Home health OT;Supervision/Assistance - 24 hour    Equipment Recommendations  3 in 1 bedside commode    Recommendations for Other Services PT consult    Precautions / Restrictions Precautions Precautions: Back Precaution Booklet Issued: Yes (comment) Precaution Comments: Pt able to recall 2/3 back precautions. Requiring cues for no twisting Required Braces or Orthoses: Spinal Brace Spinal Brace: Lumbar corset;Applied in sitting position Restrictions Weight Bearing Restrictions: No       Mobility Bed Mobility Overal bed mobility: Needs Assistance Bed Mobility: Rolling;Sidelying to Sit;Sit to Sidelying Rolling: Supervision Sidelying to sit: Min guard     Sit to sidelying: Supervision General bed mobility comments: Supervision for safety and pt demonstrating log roll technique  Transfers Overall transfer level: Needs assistance Equipment used: Standard walker;None Transfers: Sit to/from Stand Sit to Stand: Min guard;From elevated surface         General transfer comment: Min Guard A for safety and pt requiring increased time and effort. Cues for hand placement.      Balance Overall balance assessment: Needs assistance Sitting-balance support: No upper extremity supported;Feet supported Sitting balance-Leahy Scale: Good     Standing balance support: No upper extremity supported;During functional activity Standing balance-Leahy Scale: Poor Standing balance comment: Reliant on UE support and physical A                           ADL either performed or assessed with clinical judgement   ADL Overall ADL's : Needs assistance/impaired                 Upper Body Dressing : Sitting;Min guard;Cueing for sequencing Upper Body Dressing Details (indicate cue type and reason): Pt doffing brace with cues for placing straps in correct position   Lower Body Dressing Details (indicate cue type and reason): Reviewed education on LB dressing including donning pants and socks Toilet Transfer: Min guard;Ambulation;RW(simulated in room) Toilet Transfer Details (indicate cue type and reason): Min GUard A for safety   Toileting - Clothing Manipulation Details (indicate cue type and reason): Educating pt and wife and safe toilet hygiene techniques. Pt verbalized understanding Tub/ Shower Transfer: Tub transfer;Min guard;Ambulation;3 in 1;Cueing for sequencing;Cueing for safety;Rolling walker Tub/Shower Transfer Details (indicate cue type and reason): Pt requiring Max cues and Min Guard A for tub transfer. Continues to present with decreased problem solving.  Functional mobility during ADLs: Rolling walker;Min guard General ADL Comments: Reviewed all education on LB ADLs, grooming, bed mobility, brace management, and precautions. Providing education on toilet hygiene and tub transfer techniques.     Vision       Perception     Praxis      Cognition Arousal/Alertness: Awake/alert Behavior During Therapy:  WFL for tasks assessed/performed Overall Cognitive Status: Within Functional Limits for tasks assessed                                  General Comments: Continues to present with decreased recall of precautions. Feel this is possible cognitive baseline        Exercises     Shoulder Instructions       General Comments wife present and supportive    Pertinent Vitals/ Pain       Pain Assessment: 0-10 Pain Score: 2  Pain Location: Back Pain Descriptors / Indicators: Constant;Discomfort Pain Intervention(s): Monitored during session;Limited activity within patient's tolerance;Repositioned  Home Living Family/patient expects to be discharged to:: Private residence Living Arrangements: Spouse/significant other Available Help at Discharge: Family;Available 24 hours/day Type of Home: House Home Access: Level entry     Home Layout: One level     Bathroom Shower/Tub: Teacher, early years/pre: Standard     Home Equipment: None          Prior Functioning/Environment Level of Independence: Independent        Comments: Works full time   Geologist, engineering 2X/week        Progress Toward Goals  OT Goals(current goals can now be found in the care plan section)  Progress towards OT goals: Progressing toward goals  Acute Rehab OT Goals Patient Stated Goal: "recover well" OT Goal Formulation: With patient/family Time For Goal Achievement: 04/28/18 Potential to Achieve Goals: Good ADL Goals Pt Will Perform Lower Body Dressing: with set-up;with supervision;sit to/from stand Pt Will Transfer to Toilet: with set-up;with supervision;bedside commode;ambulating Pt Will Perform Toileting - Clothing Manipulation and hygiene: with set-up;with supervision;sit to/from stand;sitting/lateral leans Pt Will Perform Tub/Shower Transfer: Tub transfer;with set-up;with supervision;3 in 1;ambulating Additional ADL Goal #1: Pt will perform bed mobility using log roll technique with supervision Additional ADL Goal #2: Pt will verbalize 3/3 back precautions independently  Plan Discharge plan needs to be updated     Co-evaluation                 AM-PAC OT "6 Clicks" Daily Activity     Outcome Measure   Help from another person eating meals?: None Help from another person taking care of personal grooming?: A Little Help from another person toileting, which includes using toliet, bedpan, or urinal?: A Little Help from another person bathing (including washing, rinsing, drying)?: A Little Help from another person to put on and taking off regular upper body clothing?: A Little Help from another person to put on and taking off regular lower body clothing?: A Little 6 Click Score: 19    End of Session Equipment Utilized During Treatment: Rolling walker;Back brace  OT Visit Diagnosis: Unsteadiness on feet (R26.81);Other abnormalities of gait and mobility (R26.89);Muscle weakness (generalized) (M62.81);Pain Pain - part of body: (Back)   Activity Tolerance Patient tolerated treatment well;Patient limited by fatigue;Patient limited by pain   Patient Left in bed;with call bell/phone within reach;with family/visitor present;with SCD's reapplied   Nurse Communication Mobility status;Precautions        Time: 9485-4627 OT Time Calculation (min): 14 min  Charges: OT General Charges $OT Visit: 1 Visit OT Treatments $Self Care/Home Management : 8-22 mins  [place], OTR/L Acute Rehab Pager: [phone] Office: [place] 04/15/2018, 9:15 AM

## 2018-04-15 NOTE — Discharge Summary (Signed)
Physician Discharge Summary  Patient ID: William Perkins MRN: [id number] DOB/AGE: 1951-01-20 68 y.o.  Admit date: 04/14/2018 Discharge date: 04/15/2018  Admission Diagnoses:  Discharge Diagnoses:  Active Problems:   Degenerative spondylolisthesis   Discharged Condition: good  Hospital Course: Patient admitted to the hospital where he underwent uncomplicated lumbar decompression and fusion surgery.  Postoperatively doing very well.  Preoperative back and lower extremity pain very much improved.  Standing and walking without difficulty.  Patient ready for discharge home.  Consults:   Significant Diagnostic Studies:   Treatments:   Discharge Exam: Blood pressure 139/67, pulse 63, temperature 97.9 F (36.6 C), temperature source Oral, resp. rate 18, height 5\' 11"  (1.803 m), weight 67.6 kg, SpO2 99 %. Awake and alert.  Oriented and appropriate.  Motor and sensory function intact.  Wound clean and dry.  Chest and abdomen benign.  Disposition: Discharge disposition: 01-Home or Self Care        Allergies as of 04/15/2018   No Known Allergies     Medication List    TAKE these medications   amLODipine 10 MG tablet Commonly known as:  NORVASC Take 1 tablet (10 mg total) by mouth daily.   cloNIDine 0.1 MG tablet Commonly known as:  CATAPRES Take 1 tablet (0.1 mg total) by mouth 2 (two) times daily.   cyclobenzaprine 10 MG tablet Commonly known as:  FLEXERIL Take 1 tablet (10 mg total) by mouth 3 (three) times daily as needed for muscle spasms.   hydrALAZINE 100 MG tablet Commonly known as:  APRESOLINE Take 1 tablet (100 mg total) by mouth 2 (two) times daily.   HYDROcodone-acetaminophen 10-325 MG tablet Commonly known as:  NORCO Take 1 tablet by mouth every 4 (four) hours as needed for moderate pain ((score 4 to 6)).   lisinopril 40 MG tablet Commonly known as:  PRINIVIL,ZESTRIL Take 1 tablet (40 mg total) by mouth daily.   pravastatin 40 MG tablet Commonly known  as:  PRAVACHOL Take 1 tablet (40 mg total) by mouth every evening.            Durable Medical Equipment  (From admission, onward)         Start     Ordered   04/14/18 1232  DME Walker rolling  Once    Question:  Patient needs a walker to treat with the following condition  Answer:  Degenerative spondylolisthesis   04/14/18 1231   04/14/18 1232  DME 3 n 1  Once     04/14/18 1231           Signed: Mallie Mussel A Naudia Crosley 04/15/2018, 10:02 AM

## 2018-04-15 NOTE — Progress Notes (Signed)
Pt doing well. Pt and wife given D/C instructions with verbal understanding. Rx's were sent to pharmacy by MD. Pt's incision is clean and dry with no sign of infection. Pt's IV was removed prior to D/C. Pt received RW and 3-n-1 per MD order. Pt D/C'd home via wheelchair @ 1115 per MD order. Pt is stable @ D/C and has no other needs at this time. Holli Humbles, RN

## 2018-04-15 NOTE — Discharge Instructions (Signed)

## 2018-04-15 NOTE — Evaluation (Signed)
Physical Therapy Evaluation Patient Details Name: William Perkins MRN: [id number] DOB: November 01, 1950 Today's Date: 04/15/2018   History of Present Illness  79 male s/p L4-5 fusion on 04/14/2018. PMH including HTN, DDD cervical, and left ventricular hypertrophy.   Clinical Impression  Patient admitted s/p above procedure. Prior to admission patient IND with mobility and ADLs. Patient today requiring verbal/tactile cueing for bed mobility/log rolling with Min A for sit to stand at bedside. Min guard throughout mobility with required use of RW. Education throughout session on back precautions and safety as patient could not recall 3/3 back precautions in session.     Follow Up Recommendations Home health PT;Supervision/Assistance - 24 hour    Equipment Recommendations  Rolling walker with 5" wheels    Recommendations for Other Services       Precautions / Restrictions Precautions Precautions: Back Precaution Booklet Issued: Yes (comment) Precaution Comments: education on back precautions, log rolling technique, brace application Required Braces or Orthoses: Spinal Brace Spinal Brace: Lumbar corset;Applied in sitting position Restrictions Weight Bearing Restrictions: No      Mobility  Bed Mobility Overal bed mobility: Needs Assistance Bed Mobility: Rolling;Sidelying to Sit Rolling: Min guard Sidelying to sit: Min guard       General bed mobility comments: Min guard with verbal and tactile cueing to complete safely and successfully  Transfers Overall transfer level: Needs assistance Equipment used: Rolling walker (2 wheeled) Transfers: Sit to/from Stand Sit to Stand: Min assist;From elevated surface         General transfer comment: MIn A from elevated bed to power up into standing; iniially with flexed knee posturing requiring UE support for full extension  Ambulation/Gait Ambulation/Gait assistance: Min guard Gait Distance (Feet): 250 Feet Assistive device: Rolling  walker (2 wheeled) Gait Pattern/deviations: Step-through pattern;Decreased stride length Gait velocity: decreased   General Gait Details: required use of RW for mobility; cueing for safety throughout  Stairs            Wheelchair Mobility    Modified Rankin (Stroke Patients Only)       Balance Overall balance assessment: Needs assistance Sitting-balance support: No upper extremity supported;Feet supported Sitting balance-Leahy Scale: Good     Standing balance support: Bilateral upper extremity supported;During functional activity Standing balance-Leahy Scale: Poor Standing balance comment: reliant on UE at RW                             Pertinent Vitals/Pain Pain Assessment: 0-10 Pain Score: 2  Pain Location: Back Pain Descriptors / Indicators: Sore Pain Intervention(s): Limited activity within patient's tolerance;Monitored during session;Repositioned    Home Living Family/patient expects to be discharged to:: Private residence Living Arrangements: Spouse/significant other Available Help at Discharge: Family;Available 24 hours/day Type of Home: House Home Access: Level entry     Home Layout: One level Home Equipment: None      Prior Function Level of Independence: Independent         Comments: Works full time     Journalist, newspaper   Dominant Hand: Right    Extremity/Trunk Assessment   Upper Extremity Assessment Upper Extremity Assessment: Defer to OT evaluation    Lower Extremity Assessment Lower Extremity Assessment: Generalized weakness    Cervical / Trunk Assessment Cervical / Trunk Assessment: Other exceptions Cervical / Trunk Exceptions: s/p back sx  Communication   Communication: No difficulties  Cognition Arousal/Alertness: Awake/alert Behavior During Therapy: WFL for tasks assessed/performed Overall Cognitive Status: Within Functional  Limits for tasks assessed                                 General  Comments: unable to recall back precautions throughout session; cueing for safety      General Comments General comments (skin integrity, edema, etc.): wife present and supportive    Exercises     Assessment/Plan    PT Assessment Patient needs continued PT services  PT Problem List Decreased strength;Decreased activity tolerance;Decreased mobility;Decreased balance;Decreased knowledge of use of DME;Decreased safety awareness       PT Treatment Interventions DME instruction;Gait training;Functional mobility training;Therapeutic activities;Therapeutic exercise;Balance training;Patient/family education    PT Goals (Current goals can be found in the Care Plan section)  Acute Rehab PT Goals Patient Stated Goal: "recover well" PT Goal Formulation: With patient Time For Goal Achievement: 04/22/18 Potential to Achieve Goals: Good    Frequency Min 5X/week   Barriers to discharge        Co-evaluation               AM-PAC PT "6 Clicks" Mobility  Outcome Measure Help needed turning from your back to your side while in a flat bed without using bedrails?: A Little Help needed moving from lying on your back to sitting on the side of a flat bed without using bedrails?: A Little Help needed moving to and from a bed to a chair (including a wheelchair)?: A Little Help needed standing up from a chair using your arms (e.g., wheelchair or bedside chair)?: A Little Help needed to walk in hospital room?: A Little Help needed climbing 3-5 steps with a railing? : A Little 6 Click Score: 18    End of Session Equipment Utilized During Treatment: Gait belt;Back brace Activity Tolerance: Patient tolerated treatment well Patient left: in bed;with call bell/phone within reach;with family/visitor present(sitting EOB) Nurse Communication: Mobility status PT Visit Diagnosis: Unsteadiness on feet (R26.81);Muscle weakness (generalized) (M62.81)    Time: 3559-7416 PT Time Calculation (min) (ACUTE  ONLY): 15 min   Charges:   PT Evaluation $PT Eval Moderate Complexity: 1 Mod           Lanney Gins, PT, DPT Supplemental Physical Therapist 04/15/18 8:29 AM Pager: [phone] Office: [phone]

## 2018-04-16 ENCOUNTER — Encounter: Payer: BLUE CROSS/BLUE SHIELD | Admitting: Internal Medicine

## 2018-04-16 MED FILL — Sodium Chloride IV Soln 0.9%: INTRAVENOUS | Qty: 1000 | Status: AC

## 2018-04-16 MED FILL — Heparin Sodium (Porcine) Inj 1000 Unit/ML: INTRAMUSCULAR | Qty: 30 | Status: AC

## 2018-05-28 DIAGNOSIS — M4310 Spondylolisthesis, site unspecified: Secondary | ICD-10-CM | POA: Diagnosis not present

## 2018-07-09 DIAGNOSIS — M4310 Spondylolisthesis, site unspecified: Secondary | ICD-10-CM | POA: Diagnosis not present

## 2018-07-24 ENCOUNTER — Other Ambulatory Visit: Payer: Self-pay

## 2018-07-24 ENCOUNTER — Ambulatory Visit (INDEPENDENT_AMBULATORY_CARE_PROVIDER_SITE_OTHER): Payer: BLUE CROSS/BLUE SHIELD | Admitting: Internal Medicine

## 2018-07-24 DIAGNOSIS — N183 Chronic kidney disease, stage 3 (moderate): Secondary | ICD-10-CM | POA: Diagnosis not present

## 2018-07-24 DIAGNOSIS — I10 Essential (primary) hypertension: Secondary | ICD-10-CM

## 2018-07-24 DIAGNOSIS — D631 Anemia in chronic kidney disease: Secondary | ICD-10-CM

## 2018-07-24 DIAGNOSIS — I129 Hypertensive chronic kidney disease with stage 1 through stage 4 chronic kidney disease, or unspecified chronic kidney disease: Secondary | ICD-10-CM | POA: Diagnosis not present

## 2018-07-24 DIAGNOSIS — Z79899 Other long term (current) drug therapy: Secondary | ICD-10-CM

## 2018-07-24 DIAGNOSIS — Z981 Arthrodesis status: Secondary | ICD-10-CM

## 2018-07-24 DIAGNOSIS — D649 Anemia, unspecified: Secondary | ICD-10-CM

## 2018-07-24 DIAGNOSIS — K5909 Other constipation: Secondary | ICD-10-CM | POA: Diagnosis not present

## 2018-07-24 DIAGNOSIS — K5901 Slow transit constipation: Secondary | ICD-10-CM

## 2018-07-24 DIAGNOSIS — Z86010 Personal history of colonic polyps: Secondary | ICD-10-CM

## 2018-07-24 DIAGNOSIS — M4316 Spondylolisthesis, lumbar region: Secondary | ICD-10-CM

## 2018-07-24 DIAGNOSIS — M4310 Spondylolisthesis, site unspecified: Secondary | ICD-10-CM

## 2018-07-24 NOTE — Assessment & Plan Note (Signed)
Endorsing early prandial fullness and only one BM/week with the use of a weekly laxative. Denies n/v/d, abdominal pain. Weight has been stable. Recent colonoscopy in Nov 2020 with several polyps but no malignancy or pre-malignancy. He is no longer on narcotic pain medications.   Plan - discussed high fiber diet, can also take metamucil - instructed to take miralax as frequently as needed in order to have one BM daily

## 2018-07-24 NOTE — Progress Notes (Signed)
   CC: post-prandial fullness, HTN  HPI:  [name] is a 68 y.o. male with PMHx as listed below who presents for f/u of HTN and also complains of new early onset post-prandial fullness. He states that since his lumbar fusion 3 months ago, he has noticed that he gets full quicker. He states that he has an appetite but some foods don't taste the same and he can't eat as much as he used to. He denies abdominal pain, n/v/d, but does endorse chronic constipation even before the surgery. He only has one BM/week and takes a laxative weekly in order to have a BM.   Past Medical History:  Diagnosis Date  . Chronic renal insufficiency 03/21/2006   CrCl <50 ml  . DDD (degenerative disc disease), cervical   . GERD (gastroesophageal reflux disease) 03/22/2006  . Hand numbness 10/07/2009  . Hyperlipidemia 03/22/2008  . Hypertension   . Hypertension 03/22/2006  . Left ventricular hypertrophy 03/22/2006  . Leg pain, left 11/15/2009  . Lumbar strain 07/15/2008  . Renal insufficiency   . Soft tissue disorder 08/2008  . Tobacco abuse 03/22/2006   2 ppd x 30 years    Physical Exam:  Vitals:   07/24/18 1417 07/24/18 1444  BP: (!) 173/80 132/68  Pulse: 88 66  Temp: (!) 97.4 F (36.3 C)   TempSrc: Oral   SpO2: 100%   Weight: 146 lb 6.4 oz (66.4 kg)   Height: 5\' 11"  (1.803 m)    Gen: well appearing gentleman  Cardiac: RRR, no mrg Pulm: CTAB Abd: soft, NT, ND +BS Ext: no LEE  Assessment & Plan:   See Encounters Tab for problem based charting.  Patient discussed with Dr. Dareen Piano

## 2018-07-24 NOTE — Patient Instructions (Addendum)
It was nice seeing you today. Thank you for choosing [facility] for your Primary Care.   Today we talked about:  1) High blood pressure: keep up the good work! Your blood pressure looks great today. Keep following with the kidney doctor as well. Avoid taking NSAIDs like ibuprofen, motrin, advil as these can damage your kidneys  2) Constipation: your goal is to have 1 good, easy bowel movement every single day. Increase your fiber intake by eating more fruits, vegetables, and whole grains and take as much laxative as you need in order to help your body have one bowel movement every single day. Remember, you will be due for another colonoscopy in November.   FOLLOW-UP INSTRUCTIONS When: 1 year  For: blood pressure What to bring: all medications  Please contact the clinic if you have any problems, or need to be seen sooner.    Fiber Content in Foods  See the following list for the dietary fiber content of some common foods. High-fiber foods High-fiber foods contain 4 grams or more (4g or more) of fiber per serving. They include:  Artichoke (fresh) - 1 medium has 10.3g of fiber.  Baked beans, plain or vegetarian (canned) -  cup has 5.2g of fiber.  Blackberries or raspberries (fresh) -  cup has 4g of fiber.  Bran cereal -  cup has 8.6g of fiber.  Bulgur (cooked) -  cup has 4g of fiber.  Kidney beans (canned) -  cup has 6.8g of fiber.  Lentils (cooked) -  cup has 7.8g of fiber.  Pear (fresh) - 1 medium has 5.1g of fiber.  Peas (frozen) -  cup has 4.4g of fiber.  Pinto beans (canned) -  cup has 5.5g of fiber.  Pinto beans (dried and cooked) -  cup has 7.7g of fiber.  Potato with skin (baked) - 1 medium has 4.4g of fiber.  Quinoa (cooked) -  cup has 5g of fiber.  Soybeans (canned, frozen, or fresh) -  cup has 5.1g of fiber. Moderate-fiber foods Moderate-fiber foods contain 1-4 grams (1-4g) of fiber per serving. They include:  Almonds - 1 oz. has 3.5g  of fiber.  Apple with skin - 1 medium has 3.3g of fiber.  Applesauce, sweetened -  cup has 1.5g of fiber.  Bagel, plain - one 4-inch (10-cm) bagel has 2g of fiber.  Banana - 1 medium has 3.1g of fiber.  Broccoli (cooked) -  cup has 2.5g of fiber.  Carrots (cooked) -  cup has 2.3g of fiber.  Corn (canned or frozen) -  cup has 2.1g of fiber.  Corn tortilla - one 6-inch (15-cm) tortilla has 1.5g of fiber.  Green beans (canned) -  cup has 2g of fiber.  Instant oatmeal -  cup has about 2g of fiber.  Long-grain brown rice (cooked) - 1 cup has 3.5g of fiber.  Macaroni, enriched (cooked) - 1 cup has 2.5g of fiber.  Melon - 1 cup has 1.4g of fiber.  Multigrain cereal -  cup has about 2-4g of fiber.  Orange - 1 small has 3.1g of fiber.  Potatoes, mashed -  cup has 1.6g of fiber.  Raisins - 1/4 cup has 1.6g of fiber.  Squash -  cup has 2.9g of fiber.  Sunflower seeds -  cup has 1.1g of fiber.  Tomato - 1 medium has 1.5g of fiber.  Vegetable or soy patty - 1 has 3.4g of fiber.  Whole-wheat bread - 1 slice has 2g of fiber.  Whole-wheat  spaghetti -  cup has 3.2g of fiber. Low-fiber foods Low-fiber foods contain less than 1 gram (less than 1g) of fiber per serving. They include:  Egg - 1 large.  Flour tortilla - one 6-inch (15-cm) tortilla.  Fruit juice -  cup.  Lettuce - 1 cup.  Meat, poultry, or fish - 1 oz.  Milk - 1 cup.  Spinach (raw) - 1 cup.  White bread - 1 slice.  White rice -  cup.  Yogurt -  cup. Actual amounts of fiber in foods may be different depending on processing. Talk with your dietitian about how much fiber you need in your diet. This information is not intended to replace advice given to you by your health care provider. Make sure you discuss any questions you have with your health care provider. Document Released: 07/08/2006 Document Revised: 07/28/2015 Document Reviewed: 04/14/2015 Elsevier Interactive Patient Education   2019 [employer].

## 2018-07-24 NOTE — Assessment & Plan Note (Addendum)
S/p L4-L5 spinal fusion in Feb 2020 by Dr. Earnie Larsson. Doing well post surgery. No longer on pain medication.

## 2018-07-24 NOTE — Progress Notes (Signed)
Internal Medicine Clinic Attending  Case discussed with Dr. Vogel  at the time of the visit.  We reviewed the resident's history and exam and pertinent patient test results.  I agree with the assessment, diagnosis, and plan of care documented in the resident's note.  

## 2018-07-24 NOTE — Assessment & Plan Note (Signed)
Well controlled today. Denies symptomatic hypotension. Creatine stable at CKD stage 3. Will continue current regiment amlodipine 10mg  daily, lisinopril 40mg  daily, hydralazine 100mg  BID, and clonidine 0.1mg  BID. He is scheduled to see nephrology soon.

## 2018-07-24 NOTE — Assessment & Plan Note (Signed)
Hemoglobin improved without intervention. Colonoscopy without evidence of bleeding. No evidence of iron deficiency based on iron studies. Normocytic anemia most likely 2/2 CKD.

## 2018-08-23 ENCOUNTER — Encounter: Payer: Self-pay | Admitting: *Deleted

## 2018-10-16 ENCOUNTER — Ambulatory Visit (INDEPENDENT_AMBULATORY_CARE_PROVIDER_SITE_OTHER): Payer: Medicare HMO | Admitting: Internal Medicine

## 2018-10-16 ENCOUNTER — Encounter: Payer: Self-pay | Admitting: Internal Medicine

## 2018-10-16 ENCOUNTER — Other Ambulatory Visit: Payer: Self-pay

## 2018-10-16 VITALS — BP 137/75 | HR 62 | Temp 98.1°F | Ht 71.0 in | Wt 149.0 lb

## 2018-10-16 DIAGNOSIS — I10 Essential (primary) hypertension: Secondary | ICD-10-CM

## 2018-10-16 DIAGNOSIS — E785 Hyperlipidemia, unspecified: Secondary | ICD-10-CM

## 2018-10-16 DIAGNOSIS — M4310 Spondylolisthesis, site unspecified: Secondary | ICD-10-CM

## 2018-10-16 DIAGNOSIS — E1122 Type 2 diabetes mellitus with diabetic chronic kidney disease: Secondary | ICD-10-CM

## 2018-10-16 DIAGNOSIS — N183 Chronic kidney disease, stage 3 unspecified: Secondary | ICD-10-CM

## 2018-10-16 DIAGNOSIS — Z86010 Personal history of colonic polyps: Secondary | ICD-10-CM

## 2018-10-16 DIAGNOSIS — Z8739 Personal history of other diseases of the musculoskeletal system and connective tissue: Secondary | ICD-10-CM

## 2018-10-16 DIAGNOSIS — D649 Anemia, unspecified: Secondary | ICD-10-CM

## 2018-10-16 DIAGNOSIS — Z9889 Other specified postprocedural states: Secondary | ICD-10-CM

## 2018-10-16 DIAGNOSIS — E782 Mixed hyperlipidemia: Secondary | ICD-10-CM

## 2018-10-16 DIAGNOSIS — Z79899 Other long term (current) drug therapy: Secondary | ICD-10-CM

## 2018-10-16 DIAGNOSIS — K5909 Other constipation: Secondary | ICD-10-CM | POA: Diagnosis not present

## 2018-10-16 DIAGNOSIS — K5901 Slow transit constipation: Secondary | ICD-10-CM

## 2018-10-16 DIAGNOSIS — I129 Hypertensive chronic kidney disease with stage 1 through stage 4 chronic kidney disease, or unspecified chronic kidney disease: Secondary | ICD-10-CM

## 2018-10-16 LAB — POCT GLYCOSYLATED HEMOGLOBIN (HGB A1C): Hemoglobin A1C: 5.8 % — AB (ref 4.0–5.6)

## 2018-10-16 LAB — GLUCOSE, CAPILLARY: Glucose-Capillary: 128 mg/dL — ABNORMAL HIGH (ref 70–99)

## 2018-10-16 MED ORDER — POLYETHYLENE GLYCOL 3350 17 G PO PACK: 17.0000 g | PACK | Freq: Two times a day (BID) | ORAL | Status: DC

## 2018-10-16 MED ORDER — CLONIDINE HCL 0.1 MG PO TABS: 0.1000 mg | ORAL_TABLET | Freq: Two times a day (BID) | ORAL | 11 refills | Status: DC

## 2018-10-16 MED ORDER — AMLODIPINE BESYLATE 10 MG PO TABS: 10.0000 mg | ORAL_TABLET | Freq: Every day | ORAL | 11 refills | Status: DC

## 2018-10-16 MED ORDER — HYDRALAZINE HCL 100 MG PO TABS: 100.0000 mg | ORAL_TABLET | Freq: Two times a day (BID) | ORAL | 3 refills | Status: DC

## 2018-10-16 MED ORDER — POLYETHYLENE GLYCOL 3350 17 G PO PACK: 17.0000 g | PACK | Freq: Two times a day (BID) | ORAL | 25 refills | Status: DC

## 2018-10-16 MED ORDER — LISINOPRIL 40 MG PO TABS: 40.0000 mg | ORAL_TABLET | Freq: Every day | ORAL | 11 refills | Status: DC

## 2018-10-16 MED ORDER — PRAVASTATIN SODIUM 40 MG PO TABS: 40.0000 mg | ORAL_TABLET | Freq: Every evening | ORAL | 11 refills | Status: DC

## 2018-10-16 NOTE — Assessment & Plan Note (Addendum)
Patient states that he has had constipation for many years. He is only able to have one BM per week. He uses over the counter Doculax without success. Denies abdominal pain, melena/hematochezia, nausea, vomiting, or pain during defecation/hisotry of hemorrhoids. Patient admits to abdominal bloating that is relieved by having a BM. He had a colonoscopy almost a year ago, which showed 6 polys (1 sessile serrated/5 tubular adenomas) and needs to follow up with GI  before the end of the calender year. Patient seemed hesitant to have another colonoscopy.     Plan: - prescribed Miralax 2 cap fulls a day as need for constipation.

## 2018-10-16 NOTE — Assessment & Plan Note (Signed)
S/p surgery 04/2018 and admits to improved back pain.

## 2018-10-16 NOTE — Assessment & Plan Note (Addendum)
Patient's blood pressure is controlled but may benefit from more intensive management.  He checks his blood pressures at home and they run around 135/65.  He is currently taking amlodipine 10 mg daily, hydralazine 100 mg twice daily, lisinopril 40 mg daily  Plan: -Considering this is my first visit with William Perkins we will address his blood pressure in 3 months.

## 2018-10-16 NOTE — Assessment & Plan Note (Addendum)
Patient's most recent lipid panel shows: total cholesterol of 205, LDL of 128, and Chol/HDL ratio of 3.7. He is currently taking pravastatin 40 mg and tolerating it well. He may benefit form increasing his pravastatin at his next visit.

## 2018-10-16 NOTE — Patient Instructions (Signed)
Thank you, [name] for allowing [place] to provide your care today. Today we discussed constipation.    I have ordered cholesterol, hemoglobin A1c, BNP, CBC labs for you. I will call if any are abnormal.    I have place a referrals to none   I have ordered the following tests: none   We changes the following medications: Miralax power. Take one cap full twice a day for constipation.   Please follow-up in 3 months.    Should you have any questions or concerns please call the internal medicine clinic at [phone].    Marianna Payment, D.O. [facility]

## 2018-10-16 NOTE — Progress Notes (Signed)
   CC: Constipation  HPI:  [name] is a 68 y.o. male with a past medical history stated bellow and Presented with chronic constipation. Please see problem based assessment and plan for additional details.   Past Medical History:  Diagnosis Date  . Chronic renal insufficiency 03/21/2006   CrCl <50 ml  . DDD (degenerative disc disease), cervical   . GERD (gastroesophageal reflux disease) 03/22/2006  . Hand numbness 10/07/2009  . Hyperlipidemia 03/22/2008  . Hypertension   . Hypertension 03/22/2006  . Left ventricular hypertrophy 03/22/2006  . Leg pain, left 11/15/2009  . Lumbar strain 07/15/2008  . Renal insufficiency   . Soft tissue disorder 08/2008  . Tobacco abuse 03/22/2006   2 ppd x 30 years     Review of Systems: Review of Systems  Constitutional: Negative for chills, fever and malaise/fatigue.  Respiratory: Negative for shortness of breath.   Cardiovascular: Negative for chest pain and leg swelling.  Gastrointestinal: Positive for constipation. Negative for abdominal pain, blood in stool, diarrhea, heartburn, melena, nausea and vomiting.     There were no vitals filed for this visit.   Physical Exam: Physical Exam  Constitutional: He is oriented to person, place, and time and well-developed, well-nourished, and in no distress.  Cardiovascular: Normal rate, regular rhythm, normal heart sounds and intact distal pulses. Exam reveals no gallop and no friction rub.  No murmur heard. Pulmonary/Chest: Effort normal and breath sounds normal.  Abdominal: Soft. Bowel sounds are normal. He exhibits no distension and no mass. There is no abdominal tenderness. There is no rebound and no guarding.  Neurological: He is alert and oriented to person, place, and time.  Skin: Skin is warm and dry.     Assessment & Plan:   See Encounters Tab for problem based charting.  Patient seen with Dr. Dareen Piano

## 2018-10-16 NOTE — Assessment & Plan Note (Signed)
Patient is currently being followed by Dr. Posey Pronto but has not seen him this year due to COVID-19.  Patient does have a mild normocytic anemia of 11.8.

## 2018-10-16 NOTE — Assessment & Plan Note (Addendum)
Patient has a history of diabetes with his most recent hemoglobin A1c of 5.4% on 12/2017.  Not currently on medication.  Patient states that his most recent diabetic eye exam was in February 2020.  Denies diabetic retinopathy, changes in vision, changes in sensation to hands and feet, abdominal pain, fatigue, polydipsia polyuria.  Plan: -We will check hemoglobin A1c today and assess his need for starting medication management.

## 2018-10-17 LAB — LIPID PANEL
Chol/HDL Ratio: 3.7 ratio (ref 0.0–5.0)
Cholesterol, Total: 205 mg/dL — ABNORMAL HIGH (ref 100–199)
HDL: 56 mg/dL (ref >39)
LDL Calculated: 128 mg/dL — ABNORMAL HIGH (ref 0–99)
Triglycerides: 103 mg/dL (ref 0–149)
VLDL Cholesterol Cal: 21 mg/dL (ref 5–40)

## 2018-10-17 LAB — BMP8+ANION GAP
Anion Gap: 19 mmol/L — ABNORMAL HIGH (ref 10.0–18.0)
BUN/Creatinine Ratio: 15 (ref 10–24)
BUN: 31 mg/dL — ABNORMAL HIGH (ref 8–27)
CO2: 19 mmol/L — ABNORMAL LOW (ref 20–29)
Calcium: 9.9 mg/dL (ref 8.6–10.2)
Chloride: 101 mmol/L (ref 96–106)
Creatinine, Ser: 2.01 mg/dL — ABNORMAL HIGH (ref 0.76–1.27)
GFR calc Af Amer: 38 mL/min/{1.73_m2} — ABNORMAL LOW (ref >59)
GFR calc non Af Amer: 33 mL/min/{1.73_m2} — ABNORMAL LOW (ref >59)
Glucose: 128 mg/dL — ABNORMAL HIGH (ref 65–99)
Potassium: 4.3 mmol/L (ref 3.5–5.2)
Sodium: 139 mmol/L (ref 134–144)

## 2018-10-17 LAB — CBC
Hematocrit: 37 % — ABNORMAL LOW (ref 37.5–51.0)
Hemoglobin: 11.7 g/dL — ABNORMAL LOW (ref 13.0–17.7)
MCH: 28.9 pg (ref 26.6–33.0)
MCHC: 31.6 g/dL (ref 31.5–35.7)
MCV: 91 fL (ref 79–97)
Platelets: 240 10*3/uL (ref 150–450)
RBC: 4.05 x10E6/uL — ABNORMAL LOW (ref 4.14–5.80)
RDW: 13.1 % (ref 11.6–15.4)
WBC: 7 10*3/uL (ref 3.4–10.8)

## 2018-10-20 NOTE — Progress Notes (Signed)
Internal Medicine Clinic Attending  I saw and evaluated the patient.  I personally confirmed the key portions of the history and exam documented by Dr. Marianna Payment and I reviewed pertinent patient test results.  The assessment, diagnosis, and plan were formulated together and I agree with the documentation in the resident's note.  Lenice Pressman, M.D., Ph.D.

## 2018-12-25 ENCOUNTER — Other Ambulatory Visit: Payer: Self-pay

## 2018-12-25 ENCOUNTER — Other Ambulatory Visit: Payer: Self-pay | Admitting: *Deleted

## 2018-12-25 DIAGNOSIS — I10 Essential (primary) hypertension: Secondary | ICD-10-CM

## 2018-12-25 NOTE — Telephone Encounter (Signed)
Has refills at pharmacy named in message, needed one refill due to miscalculation in amt dispensed, request sent to dr Marianna Payment

## 2018-12-25 NOTE — Telephone Encounter (Signed)
  Requesting all meds to be filled @  [facility] ([address]), [place] [phone] (Phone) [phone] (Fax)

## 2018-12-26 NOTE — Telephone Encounter (Signed)
Pt is calling back [phone]; pt is wanting a callback

## 2018-12-26 NOTE — Telephone Encounter (Signed)
There is no reason for this medication on the problem list.  It was filled for twice daily dosing but 90 pills with 3 refills, therefore over a 7-month supply, at the end of August.

## 2018-12-29 ENCOUNTER — Telehealth: Payer: Self-pay | Admitting: *Deleted

## 2018-12-29 ENCOUNTER — Other Ambulatory Visit: Payer: Self-pay | Admitting: *Deleted

## 2018-12-29 DIAGNOSIS — I10 Essential (primary) hypertension: Secondary | ICD-10-CM

## 2018-12-29 NOTE — Telephone Encounter (Signed)
Call from pt - stated he had called the pharmacy and was told, no refills on all of his meds.  I called [facility] pharmacy - informed all of pt's meds were refilled on 10/16/18. Stated need refill on Clonidine which was not sent electronically with the others. Verbal order given to Shanon Brow, pharmacist, for Clonidine 0.1 mg. They will get meds ready for pt.  Called pt back - informed of the above.

## 2018-12-31 ENCOUNTER — Other Ambulatory Visit: Payer: Self-pay | Admitting: *Deleted

## 2018-12-31 DIAGNOSIS — I10 Essential (primary) hypertension: Secondary | ICD-10-CM

## 2019-01-02 MED ORDER — CLONIDINE HCL 0.1 MG PO TABS: 0.1000 mg | ORAL_TABLET | Freq: Two times a day (BID) | ORAL | 11 refills | Status: DC

## 2019-01-02 NOTE — Telephone Encounter (Signed)
Called the patient. He stated that he needs a refill for his Hydralazine. I called the pharmacy and they said that he picked up the prescription on 10/19. Call the patient back to inform him that he has already picked up the prescription. He stated that he needs his other medications as wells. Looking at his chart, he still has multiple refilled on all of his prescriptions. The patient was informed of this information.   Marianna Payment, D.O. Date 01/02/2019 Time 11:25 AM [facility], PGY-1 Pager: [phone]

## 2019-01-22 ENCOUNTER — Ambulatory Visit (INDEPENDENT_AMBULATORY_CARE_PROVIDER_SITE_OTHER): Payer: Medicare HMO | Admitting: Internal Medicine

## 2019-01-22 ENCOUNTER — Encounter: Payer: Self-pay | Admitting: Internal Medicine

## 2019-01-22 ENCOUNTER — Other Ambulatory Visit: Payer: Self-pay

## 2019-01-22 VITALS — BP 171/86 | HR 84 | Temp 98.4°F | Wt 151.0 lb

## 2019-01-22 DIAGNOSIS — I10 Essential (primary) hypertension: Secondary | ICD-10-CM | POA: Diagnosis not present

## 2019-01-22 DIAGNOSIS — N183 Chronic kidney disease, stage 3 unspecified: Secondary | ICD-10-CM

## 2019-01-22 DIAGNOSIS — Z79899 Other long term (current) drug therapy: Secondary | ICD-10-CM

## 2019-01-22 DIAGNOSIS — I129 Hypertensive chronic kidney disease with stage 1 through stage 4 chronic kidney disease, or unspecified chronic kidney disease: Secondary | ICD-10-CM

## 2019-01-22 LAB — POCT GLYCOSYLATED HEMOGLOBIN (HGB A1C): Hemoglobin A1C: 5.5 % (ref 4.0–5.6)

## 2019-01-22 LAB — GLUCOSE, CAPILLARY: Glucose-Capillary: 101 mg/dL — ABNORMAL HIGH (ref 70–99)

## 2019-01-22 MED ORDER — CLONIDINE HCL 0.2 MG PO TABS: 0.2000 mg | ORAL_TABLET | Freq: Two times a day (BID) | ORAL | 11 refills | Status: DC

## 2019-01-22 MED ORDER — CLONIDINE HCL 0.1 MG PO TABS: 0.2000 mg | ORAL_TABLET | Freq: Two times a day (BID) | ORAL | 11 refills | Status: DC

## 2019-01-22 NOTE — Assessment & Plan Note (Addendum)
Patient states that he takes all of his blood pressure medications as prescribed, but does not check it at home because he does not have a cuff.  Patient has significantly elevated blood pressure today at 171/86.  His blood pressure is not currently controlled.  We will increase his blood pressure medication today.  We will increase clonidine 0.1 mg twice daily to 0.2 mg twice daily.

## 2019-01-22 NOTE — Assessment & Plan Note (Signed)
Told patient to follow-up with his nephrologist for his chronic kidney disease.  We will repeat a BMP today

## 2019-01-22 NOTE — Patient Instructions (Addendum)
Thank you, [name] for allowing [place] to provide your care today. Today we discussed High blood pressure.    I have ordered BMP and HgbA1C labs for you. I will call if any are abnormal.    I have place a referrals to none.   I have ordered the following tests: none   I have ordered the following medication/changed the following medications: Increased your clonidine to 0.2 mg twice daily. Please take two pills of your current prescription (0.1 mg) in the morning and 2 at night until you refill your prescription.    Please follow-up in 1 month for blood pressure.    Should you have any questions or concerns please call the internal medicine clinic at [phone].    William Perkins, D.O. [facility]

## 2019-01-22 NOTE — Progress Notes (Signed)
   CC: HTN  HPI:  Mr.Brazen C Eastridge is a 68 y.o. male with a past medical history stated below and presents today for hypertension. Please see problem based assessment and plan for additional details.   Past Medical History:  Diagnosis Date  . Chronic renal insufficiency 03/21/2006   CrCl <50 ml  . DDD (degenerative disc disease), cervical   . GERD (gastroesophageal reflux disease) 03/22/2006  . Hand numbness 10/07/2009  . Hyperlipidemia 03/22/2008  . Hypertension   . Hypertension 03/22/2006  . Left ventricular hypertrophy 03/22/2006  . Leg pain, left 11/15/2009  . Lumbar strain 07/15/2008  . Renal insufficiency   . Soft tissue disorder 08/2008  . Tobacco abuse 03/22/2006   2 ppd x 30 years     Review of Systems: Review of Systems  Constitutional: Negative for chills, fever and weight loss.  Respiratory: Negative for cough and shortness of breath.   Cardiovascular: Negative for chest pain, palpitations and leg swelling.  Gastrointestinal: Negative for abdominal pain.  Genitourinary: Negative for dysuria and hematuria.  Neurological: Negative for headaches.     There were no vitals filed for this visit.   Physical Exam: Physical Exam  Constitutional: He is oriented to person, place, and time and well-developed, well-nourished, and in no distress.  HENT:  Head: Normocephalic and atraumatic.  Eyes: EOM are normal.  Neck: Normal range of motion.  Cardiovascular: Normal rate, regular rhythm, normal heart sounds and intact distal pulses. Exam reveals no gallop and no friction rub.  No murmur heard. Pulmonary/Chest: Effort normal. No respiratory distress. He exhibits no tenderness.  Abdominal: Soft. There is no abdominal tenderness.  Musculoskeletal: Normal range of motion.  Neurological: He is alert and oriented to person, place, and time.  Skin: Skin is warm and dry.     Assessment & Plan:   See Encounters Tab for problem based charting.  Patient seen with Dr.  Evette Doffing

## 2019-01-23 LAB — BMP8+ANION GAP
Anion Gap: 18 mmol/L (ref 10.0–18.0)
BUN/Creatinine Ratio: 15 (ref 10–24)
BUN: 30 mg/dL — ABNORMAL HIGH (ref 8–27)
CO2: 19 mmol/L — ABNORMAL LOW (ref 20–29)
Calcium: 9.8 mg/dL (ref 8.6–10.2)
Chloride: 101 mmol/L (ref 96–106)
Creatinine, Ser: 2.02 mg/dL — ABNORMAL HIGH (ref 0.76–1.27)
GFR calc Af Amer: 38 mL/min/{1.73_m2} — ABNORMAL LOW (ref >59)
GFR calc non Af Amer: 33 mL/min/{1.73_m2} — ABNORMAL LOW (ref >59)
Glucose: 107 mg/dL — ABNORMAL HIGH (ref 65–99)
Potassium: 4.1 mmol/L (ref 3.5–5.2)
Sodium: 138 mmol/L (ref 134–144)

## 2019-01-23 NOTE — Progress Notes (Signed)
Internal Medicine Clinic Attending  I saw and evaluated the patient.  I personally confirmed the key portions of the history and exam documented by Dr. Coe and I reviewed pertinent patient test results.  The assessment, diagnosis, and plan were formulated together and I agree with the documentation in the resident's note.    

## 2019-01-28 ENCOUNTER — Encounter: Payer: Self-pay | Admitting: Internal Medicine

## 2019-05-26 IMAGING — CR DG LUMBAR SPINE COMPLETE 4+V
5 series · 5 of 5 positions shown · non-contrast
Comparison: None.

CLINICAL DATA: Intermittent low back pain extends into the right
hip and lower extremity.

EXAM:
LUMBAR SPINE - COMPLETE 4+ VIEW

[l-spine ap]
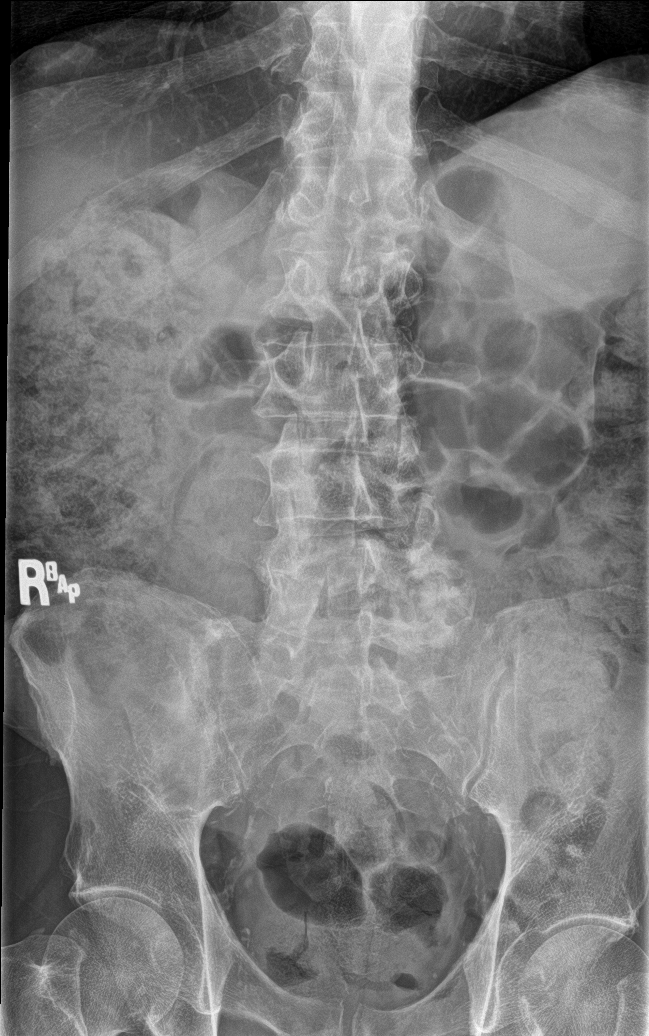

[l-spine obl (1 of 2)]
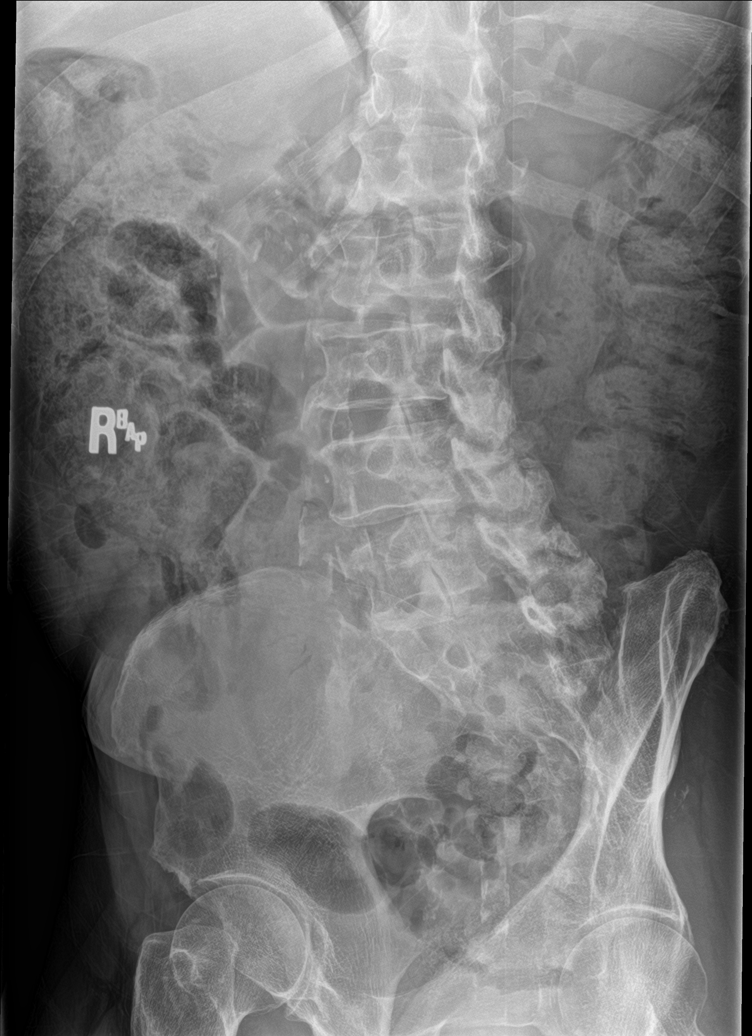

[l-spine obl (2 of 2)]
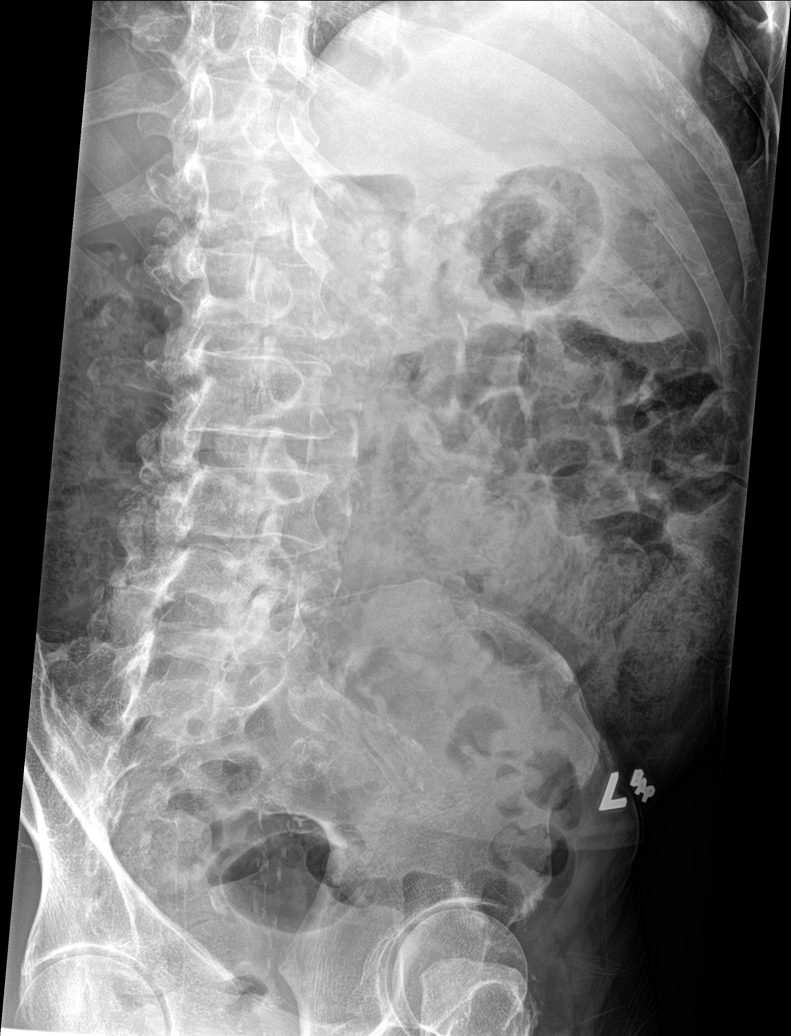

[l-spine lat]
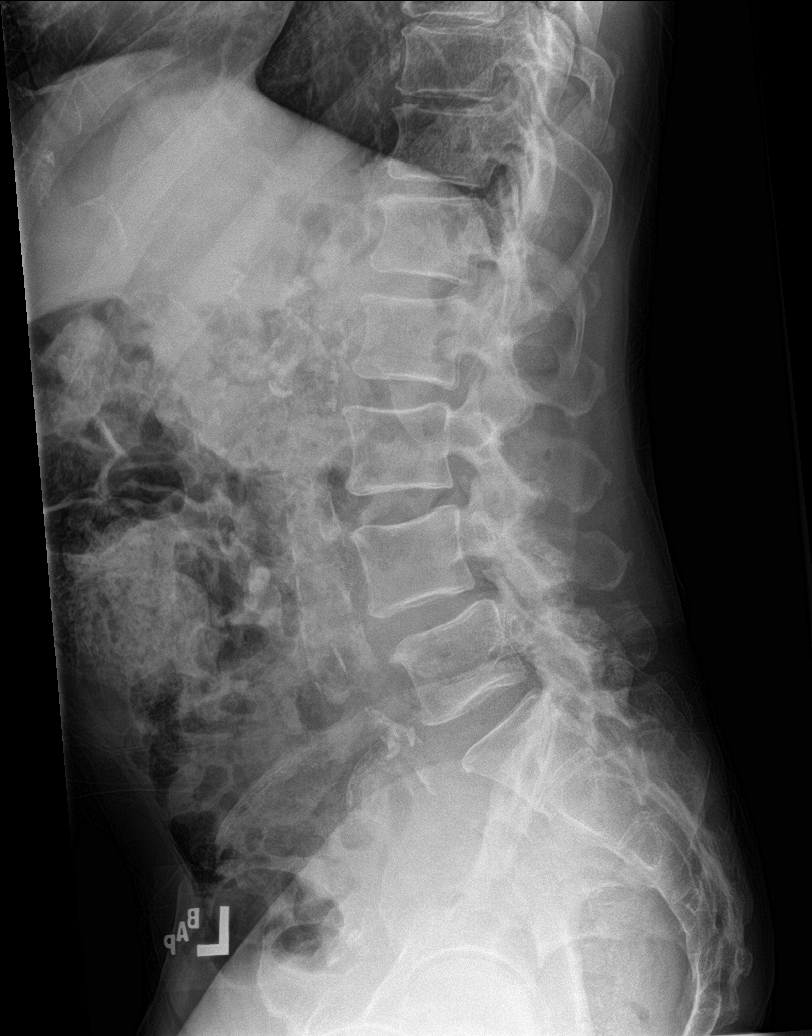

[l-spine spot]
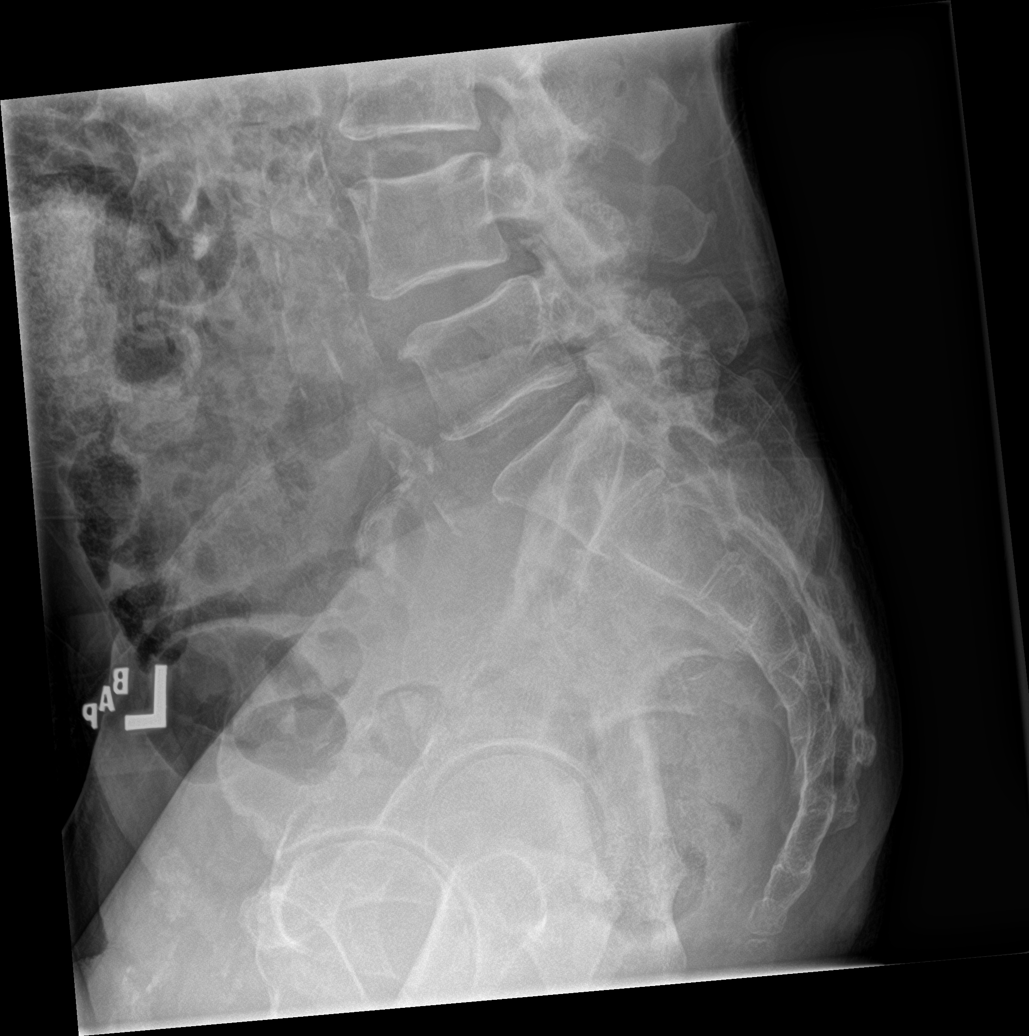

[5 of 5 positions shown; findings below may reference images not displayed]

FINDINGS: Mild rightward curvature is centered at L2. Asymmetric left-sided
facet hypertrophy is present at L4-5 and L5-S1. Grade 1
anterolisthesis is present at L4-5. Vertebral body heights are
maintained. Slight retrolisthesis is present at L2-3.

Atherosclerotic calcifications are present in the aorta and branch
vessels without aneurysm.
IMPRESSION: 1. Mild rightward curvature of the lumbar spine with asymmetric left
greater than right facet hypertrophy at L4-5 and L5-S1.
2. Slight degenerative anterolisthesis at L4-5.

## 2019-08-31 ENCOUNTER — Other Ambulatory Visit: Payer: Self-pay | Admitting: *Deleted

## 2019-08-31 DIAGNOSIS — I10 Essential (primary) hypertension: Secondary | ICD-10-CM

## 2019-08-31 DIAGNOSIS — E782 Mixed hyperlipidemia: Secondary | ICD-10-CM

## 2019-08-31 MED ORDER — LISINOPRIL 40 MG PO TABS: 40.0000 mg | ORAL_TABLET | Freq: Every day | ORAL | 11 refills | Status: DC

## 2019-08-31 MED ORDER — PRAVASTATIN SODIUM 40 MG PO TABS: 40.0000 mg | ORAL_TABLET | Freq: Every evening | ORAL | 11 refills | Status: DC

## 2019-08-31 MED ORDER — HYDRALAZINE HCL 100 MG PO TABS: 100.0000 mg | ORAL_TABLET | Freq: Two times a day (BID) | ORAL | 3 refills | Status: DC

## 2019-08-31 MED ORDER — AMLODIPINE BESYLATE 10 MG PO TABS: 10.0000 mg | ORAL_TABLET | Freq: Every day | ORAL | 11 refills | Status: DC

## 2019-08-31 NOTE — Telephone Encounter (Signed)
Pt stated he's out of medication; he had called the pharmacy last week. Also f/u appt has been scheduled on 7/1 with Dr Marianna Payment.

## 2019-09-03 ENCOUNTER — Other Ambulatory Visit: Payer: Self-pay

## 2019-09-03 ENCOUNTER — Ambulatory Visit (INDEPENDENT_AMBULATORY_CARE_PROVIDER_SITE_OTHER): Payer: Medicare HMO | Admitting: Internal Medicine

## 2019-09-03 ENCOUNTER — Encounter: Payer: Self-pay | Admitting: Internal Medicine

## 2019-09-03 VITALS — BP 146/71 | HR 57 | Temp 98.2°F | Ht 71.0 in | Wt 146.9 lb

## 2019-09-03 DIAGNOSIS — E782 Mixed hyperlipidemia: Secondary | ICD-10-CM

## 2019-09-03 DIAGNOSIS — N1832 Chronic kidney disease, stage 3b: Secondary | ICD-10-CM

## 2019-09-03 DIAGNOSIS — E1121 Type 2 diabetes mellitus with diabetic nephropathy: Secondary | ICD-10-CM

## 2019-09-03 DIAGNOSIS — E1122 Type 2 diabetes mellitus with diabetic chronic kidney disease: Secondary | ICD-10-CM | POA: Diagnosis not present

## 2019-09-03 DIAGNOSIS — I10 Essential (primary) hypertension: Secondary | ICD-10-CM

## 2019-09-03 DIAGNOSIS — I129 Hypertensive chronic kidney disease with stage 1 through stage 4 chronic kidney disease, or unspecified chronic kidney disease: Secondary | ICD-10-CM

## 2019-09-03 LAB — POCT GLYCOSYLATED HEMOGLOBIN (HGB A1C): Hemoglobin A1C: 5.8 % — AB (ref 4.0–5.6)

## 2019-09-03 LAB — GLUCOSE, CAPILLARY: Glucose-Capillary: 112 mg/dL — ABNORMAL HIGH (ref 70–99)

## 2019-09-03 MED ORDER — SPIRONOLACTONE 25 MG PO TABS: 12.5000 mg | ORAL_TABLET | Freq: Every day | ORAL | 2 refills | Status: DC

## 2019-09-03 NOTE — Progress Notes (Signed)
CC: Hypertension  HPI:  William Perkins is a 69 y.o. @GENDER @ male with a past medical history stated below and presents today for hypertension. Please see problem based assessment and plan for additional details.  Past Medical History:  Diagnosis Date  . Chronic renal insufficiency 03/21/2006   CrCl <50 ml  . DDD (degenerative disc disease), cervical   . GERD (gastroesophageal reflux disease) 03/22/2006  . Hand numbness 10/07/2009  . Hyperlipidemia 03/22/2008  . Hypertension   . Hypertension 03/22/2006  . Left ventricular hypertrophy 03/22/2006  . Leg pain, left 11/15/2009  . Lumbar strain 07/15/2008  . Renal insufficiency   . Soft tissue disorder 08/2008  . Tobacco abuse 03/22/2006   2 ppd x 30 years    Current Outpatient Medications on File Prior to Visit  Medication Sig Dispense Refill  . amLODipine (NORVASC) 10 MG tablet Take 1 tablet (10 mg total) by mouth daily. 30 tablet 11  . cloNIDine (CATAPRES) 0.2 MG tablet Take 1 tablet (0.2 mg total) by mouth 2 (two) times daily. 60 tablet 11  . hydrALAZINE (APRESOLINE) 100 MG tablet Take 1 tablet (100 mg total) by mouth 2 (two) times daily. 90 tablet 3  . lisinopril (ZESTRIL) 40 MG tablet Take 1 tablet (40 mg total) by mouth daily. 30 tablet 11  . polyethylene glycol (MIRALAX / GLYCOLAX) 17 g packet Take 17 g by mouth 2 (two) times daily. 14 each 25  . pravastatin (PRAVACHOL) 40 MG tablet Take 1 tablet (40 mg total) by mouth every evening. 30 tablet 11  . psyllium (METAMUCIL) 58.6 % packet Take 1 packet by mouth daily.    . [DISCONTINUED] lisinopril-hydrochlorothiazide (PRINZIDE,ZESTORETIC) 20-12.5 MG per tablet Take 2 tablets by mouth daily.     No current facility-administered medications on file prior to visit.    Family History  Problem Relation Age of Onset  . Diabetes Mother   . Diabetes Sister   . Diabetes Brother   . Heart disease Brother   . Rectal cancer Neg Hx   . Stomach cancer Neg Hx   . Colon cancer  Neg Hx     Social History   Socioeconomic History  . Marital status: Married    Spouse name: Not on file  . Number of children: Not on file  . Years of education: 47  . Highest education level: Not on file  Occupational History  . Not on file  Tobacco Use  . Smoking status: Former Smoker    Packs/day: 1.00    Years: 44.00    Pack years: 44.00    Types: Cigarettes    Quit date: 11/21/2013    Years since quitting: 5.7  . Smokeless tobacco: Never Used  . Tobacco comment: Stopped 11/21/2013   Vaping Use  . Vaping Use: Never used  Substance and Sexual Activity  . Alcohol use: No    Alcohol/week: 0.0 standard drinks    Comment: Quit 2015  . Drug use: No  . Sexual activity: Yes    Partners: Female  Other Topics Concern  . Not on file  Social History Narrative  . Not on file   Social Determinants of Health   Financial Resource Strain:   . Difficulty of Paying Living Expenses:   Food Insecurity:   . Worried About Charity fundraiser in the Last Year:   . Arboriculturist in the Last Year:   Transportation Needs:   . Film/video editor (Medical):   Marland Kitchen Lack of  Transportation (Non-Medical):   Physical Activity:   . Days of Exercise per Week:   . Minutes of Exercise per Session:   Stress:   . Feeling of Stress :   Social Connections:   . Frequency of Communication with Friends and Family:   . Frequency of Social Gatherings with Friends and Family:   . Attends Religious Services:   . Active Member of Clubs or Organizations:   . Attends Archivist Meetings:   Marland Kitchen Marital Status:   Intimate Partner Violence:   . Fear of Current or Ex-Partner:   . Emotionally Abused:   Marland Kitchen Physically Abused:   . Sexually Abused:     Review of Systems: ROS negative except for what is noted on the assessment and plan.  There were no vitals filed for this visit.   Physical Exam: Physical Exam Constitutional:      Appearance: Normal appearance.  HENT:     Head:  Normocephalic and atraumatic.  Eyes:     Extraocular Movements: Extraocular movements intact.  Cardiovascular:     Rate and Rhythm: Normal rate.     Pulses: Normal pulses.     Heart sounds: Normal heart sounds.  Pulmonary:     Effort: Pulmonary effort is normal.     Breath sounds: Normal breath sounds.  Abdominal:     General: Bowel sounds are normal.     Palpations: Abdomen is soft.     Tenderness: There is no abdominal tenderness.  Musculoskeletal:        General: Normal range of motion.     Cervical back: Normal range of motion.     Right lower leg: No edema.     Left lower leg: No edema.  Skin:    General: Skin is warm and dry.  Neurological:     Mental Status: He is alert and oriented to person, place, and time. Mental status is at baseline.  Psychiatric:        Mood and Affect: Mood normal.      Assessment & Plan:   See Encounters Tab for problem based charting.  Patient discussed with Dr. Elyn Peers, D.O. [facility], PGY-1 Pager: [phone], Phone: [phone] Date 09/03/2019 Time 1:00 PM

## 2019-09-03 NOTE — Patient Instructions (Signed)
Thank you, [name] for allowing [place] to provide your care today. Today we discussed Diabetes, Hypertension, Chronic Kidney disease.    I have ordered the following labs for you:   Lab Orders     BMP8+Anion Gap     Microalbumin / Creatinine Urine Ratio     POC Hbg A1C   I will call if any are abnormal. All of your labs can be accessed through "My Chart".  I have place a referrals to none.  I have ordered the following tests: none   I have ordered the following medication/changed the following medications:  1. begin Spironolactone 12.5 mg daily  Please follow-up in in 1 weeks.  Should you have any questions or concerns please call the internal medicine clinic at [phone].    Marianna Payment, D.O.  Internal Medicine   My Chart Access: [email or web address]   If you have not already done so, please get your COVID 19 vaccine  To schedule an appointment for a COVID vaccine choice any of the following: Go to [email or web address]   Go to [email or web address]                  Call [phone]                                     Call [phone] and select Option 2

## 2019-09-03 NOTE — Assessment & Plan Note (Addendum)
Patient has a history of hypertension with intermittent control. He is currently taking amlodipine 10 mg, lisinopril 40 mg, hydralazine 10 mg and clonidine 0.2 mg. His blood pressure during this office visit is 146/71. He does admit to running out of his Hydralazine this week, but otherwise states that he is compliant with his medications. He has tried thiazide diuretics in the past, but was discontinued due to medication side effect. Considering his history, I am concerned for resistant hypertension. Patient had a renal US 2015 without evidence of renal artery stenosis. He denies any signs or symptoms concerning for sleep apnea. He does have significant renal insufficient and was previously seen at [facility]  The etiology of kidney disease was thought to be 2/2 to HTN and NSAID use. He does not have any significant electrolyte abnormalities that would be concerning for primary hyperaldosteronism.   Plan:  1. Assess renal function with BMP 2. Start spironolactone 12.5 mg daily   Patients blood pressure is 146/71. He states that he has been out of the hydralazine for about 1 week. He states that he does not check his blood pressure at home. Denies symptoms

## 2019-09-03 NOTE — Assessment & Plan Note (Addendum)
Patient has a history of CKD stage 3b. and was previously followed by [facility]. Will assess renal funciton and determine the need for further evaluation/ referral for CKD. He will likely need consistent blood pressure control to limit progression of CKD.  Plan: -  Counsel patient on making a follow up appointment at [facility] with Dr. Posey Pronto

## 2019-09-03 NOTE — Assessment & Plan Note (Addendum)
Patient has history of DM with most recent A1c of 5.5 and 5.8, respectively. He denies checking his blood sugar at home. He denies symptoms of hyperglycemia, including diaphoresis, fatigue, polydipsia, or polyuria. I am concerned that his A1c may be falsely low in the setting of his CKD stage 3b.   Plan: - A1c 5.8 and random glucose of 112 make poor glycemic control less likely.  - BMP pending - Continue to monitor off of DM medications

## 2019-09-04 LAB — BMP8+ANION GAP
Anion Gap: 15 mmol/L (ref 10.0–18.0)
BUN/Creatinine Ratio: 13 (ref 10–24)
BUN: 27 mg/dL (ref 8–27)
CO2: 20 mmol/L (ref 20–29)
Calcium: 10 mg/dL (ref 8.6–10.2)
Chloride: 105 mmol/L (ref 96–106)
Creatinine, Ser: 2.08 mg/dL — ABNORMAL HIGH (ref 0.76–1.27)
GFR calc Af Amer: 36 mL/min/{1.73_m2} — ABNORMAL LOW (ref >59)
GFR calc non Af Amer: 32 mL/min/{1.73_m2} — ABNORMAL LOW (ref >59)
Glucose: 117 mg/dL — ABNORMAL HIGH (ref 65–99)
Potassium: 4.6 mmol/L (ref 3.5–5.2)
Sodium: 140 mmol/L (ref 134–144)

## 2019-09-04 LAB — MICROALBUMIN / CREATININE URINE RATIO
Creatinine, Urine: 128 mg/dL
Microalb/Creat Ratio: 1245 mg/g creat — ABNORMAL HIGH (ref 0–29)
Microalbumin, Urine: 1593.6 ug/mL

## 2019-09-09 ENCOUNTER — Telehealth: Payer: Self-pay | Admitting: Internal Medicine

## 2019-09-09 NOTE — Telephone Encounter (Signed)
Yes please. Thank you

## 2019-09-09 NOTE — Telephone Encounter (Signed)
[facility] pharmacy was called /informed ok to fill Spironolactone rx in spite of interaction with Lisinopril per Dr Marianna Payment.

## 2019-09-09 NOTE — Telephone Encounter (Signed)
Pt called, stated he still having problems getting his medications. Stated the pharmacy told him his doctor needs to send new rxs. Informed pt , 4 rxs were filled on 08/31/19.  I called [facility], talked to [facility] - stated Amlodipine, Pravastatin were refilled on 5/25 #90 tabs, too soon to refill; Lisinopril can be fill on 7/25; Hydralazine last refilled on 5/18 #90 - did not receive rx 6/28 - verbal order given #90 x 3 RF's per Dr Marianna Payment to the pharmacist.  AND Spironolactone was not filled b/c stated they had sent a fax stating interaction with Lisinopril, and received no reply.  I called pt - informed of the above.

## 2019-09-10 ENCOUNTER — Encounter: Payer: Medicare HMO | Admitting: Internal Medicine

## 2019-09-10 NOTE — Progress Notes (Signed)
Internal Medicine Clinic Attending  Case discussed with Dr. Coe  At the time of the visit.  We reviewed the resident's history and exam and pertinent patient test results.  I agree with the assessment, diagnosis, and plan of care documented in the resident's note.  

## 2019-10-26 ENCOUNTER — Other Ambulatory Visit: Payer: Self-pay | Admitting: Internal Medicine

## 2019-10-26 DIAGNOSIS — E782 Mixed hyperlipidemia: Secondary | ICD-10-CM

## 2019-10-27 NOTE — Telephone Encounter (Signed)
Please make a follow up appointment to discuss his blood pressure.

## 2019-11-16 ENCOUNTER — Other Ambulatory Visit: Payer: Self-pay

## 2019-11-16 ENCOUNTER — Ambulatory Visit (INDEPENDENT_AMBULATORY_CARE_PROVIDER_SITE_OTHER): Payer: Medicare HMO | Admitting: Internal Medicine

## 2019-11-16 ENCOUNTER — Encounter: Payer: Self-pay | Admitting: Internal Medicine

## 2019-11-16 DIAGNOSIS — I10 Essential (primary) hypertension: Secondary | ICD-10-CM

## 2019-11-16 DIAGNOSIS — M79604 Pain in right leg: Secondary | ICD-10-CM

## 2019-11-16 DIAGNOSIS — I739 Peripheral vascular disease, unspecified: Secondary | ICD-10-CM | POA: Insufficient documentation

## 2019-11-16 MED ORDER — CILOSTAZOL 100 MG PO TABS: 100.0000 mg | ORAL_TABLET | Freq: Two times a day (BID) | ORAL | 0 refills | Status: DC

## 2019-11-16 MED ORDER — ASPIRIN EC 81 MG PO TBEC
81.0000 mg | DELAYED_RELEASE_TABLET | Freq: Every day | ORAL | 0 refills | Status: DC
Start: 2019-11-16 — End: 2020-04-07

## 2019-11-16 NOTE — Assessment & Plan Note (Signed)
Mr. Partain presents to the clinic today for a FU on his HTN. He is compliant with his regimen of amlodipine, clonidine, hydralazine, lisinopril, and spironolactone. He denies any side effects of his medications and his BP is as follows:  BP Readings from Last 3 Encounters:  11/16/19 131/67  09/03/19 (!) 146/71  01/22/19 (!) 171/86   His pressure is well controlled at this time.  - Continue current regimen - Monitor vitals at subsequent visits.

## 2019-11-16 NOTE — Progress Notes (Signed)
   CC: Leg pain and hypertension   HPI:  [name] is a 69 y.o. male, with a PMHx noted below, presents today for a follow up on his hypertension and leg pain. To see the management of his acute and chronic conditions, please see the A&P note under the Encounters tab.   Past Medical History:  Diagnosis Date  . Chronic renal insufficiency 03/21/2006   CrCl <50 ml  . DDD (degenerative disc disease), cervical   . GERD (gastroesophageal reflux disease) 03/22/2006  . Hand numbness 10/07/2009  . Hyperlipidemia 03/22/2008  . Hypertension   . Hypertension 03/22/2006  . Left ventricular hypertrophy 03/22/2006  . Leg pain, left 11/15/2009  . Lumbar strain 07/15/2008  . Renal insufficiency   . Soft tissue disorder 08/2008  . Tobacco abuse 03/22/2006   2 ppd x 30 years   Review of Systems:   Review of Systems  Constitutional: Negative for chills, fever, malaise/fatigue and weight loss.  Respiratory: Negative for shortness of breath.   Cardiovascular: Positive for claudication. Negative for chest pain, palpitations, orthopnea and leg swelling.  Gastrointestinal: Negative for abdominal pain, constipation, diarrhea, nausea and vomiting.  Musculoskeletal: Negative for joint pain.  Neurological: Negative for focal weakness and weakness.    Physical Exam:  Vitals:   11/16/19 0842  BP: 131/67  Pulse: 80  Temp: 98.4 F (36.9 C)  TempSrc: Oral  SpO2: 100%  Weight: 142 lb 12.8 oz (64.8 kg)   Physical Exam Constitutional:      General: He is not in acute distress.    Appearance: Normal appearance. He is not ill-appearing or toxic-appearing.  Cardiovascular:     Rate and Rhythm: Normal rate and regular rhythm.     Pulses: Normal pulses.     Heart sounds: Normal heart sounds. No murmur heard.  No friction rub. No gallop.   Pulmonary:     Effort: Pulmonary effort is normal.     Breath sounds: Normal breath sounds. No wheezing, rhonchi or rales.  Musculoskeletal:         General: No swelling, tenderness, deformity or signs of injury. Normal range of motion.     Right lower leg: No edema.     Left lower leg: No edema.     Comments: ROM wnl bilaterally in the lower extremities, no midline tenderness to palpitation, negative straight leg raise bilaterally. No ulcerations, skin changes, hair loss noted.   Neurological:     Mental Status: He is alert and oriented to person, place, and time.     Assessment & Plan:   See Encounters Tab for problem based charting.  Patient discussed with Dr. Rebeca Alert

## 2019-11-16 NOTE — Patient Instructions (Addendum)
To Mr. Apo,  It was a pleasure meeting you today. Today we discussed your left leg pain and hypertension. For your hypertension, continue your medications. For your left leg pain, you were found to have peripheral artery disease. We are going to start you on a medication called cilostazol. Please take this medication in the morning and in the evening. We will also start you on daily aspirin, and refer you to therapy to work on your leg. Please see [place] in 3 months for follow up. Have a good day!  Sincerely,  Maudie Mercury, MD

## 2019-11-16 NOTE — Assessment & Plan Note (Addendum)
Patient comes to the clinic with complaints of intermittent R. leg pain for the past 6 months. He describes the pain as "cramping" down his leg, and "cramping" sensation of his toes. He states that his pain is aggravated by walking approximately 20-30 feet, and alleviated by resting. He states that NSAIDs have not helped alleviate the pain. The pain does not occur at night while he is sleeping. He denies nausea, vomiting, abdominal pain, hip pain.   A:  Patient is a former user of tobacco, he quit 10 years ago. Given clinical picture of "leg cramping" given limited walking distance, former tobacco use history, PAD was initially on the differential. ABI were obtained and came back positive for PAD. Patient is already on statin therapy, medications for HTN, and will start aspirin, and cilostazol. Patient agreeable to start cardiac rehab for supervised exercise.  - Continue Hypertension regimen - Aspirin 81 mg Daily  - Cilostazol 100 mg twice daily  - Referral for supervised therapy.

## 2019-11-17 NOTE — Progress Notes (Signed)
Internal Medicine Clinic Attending  Case discussed with Dr. Winters at the time of the visit.  We reviewed the resident's history and exam and pertinent patient test results.  I agree with the assessment, diagnosis, and plan of care documented in the resident's note.  Kailea Dannemiller, M.D., Ph.D.  

## 2019-11-25 ENCOUNTER — Telehealth: Payer: Self-pay | Admitting: *Deleted

## 2019-11-25 NOTE — Telephone Encounter (Signed)
Patient called in stating he took 2 doses of Pletal on 11/20/2019 and one dose on 9/18/201 but stopped after that 2/2 nausea, weakness, and drowsiness. Would like to know what he should do now. Will forward to Red Team. Hubbard Hartshorn, BSN, RN-BC

## 2019-11-26 NOTE — Telephone Encounter (Signed)
Please have patient make a tele appointment with [place] and hold the medication for now

## 2019-11-26 NOTE — Telephone Encounter (Signed)
Requesting to speak with a nurse. Please call pt back.  

## 2019-11-27 ENCOUNTER — Ambulatory Visit (INDEPENDENT_AMBULATORY_CARE_PROVIDER_SITE_OTHER): Payer: Medicare HMO | Admitting: Student

## 2019-11-27 ENCOUNTER — Other Ambulatory Visit: Payer: Self-pay

## 2019-11-27 DIAGNOSIS — M79604 Pain in right leg: Secondary | ICD-10-CM | POA: Diagnosis not present

## 2019-11-27 NOTE — Progress Notes (Signed)
  [facility] Residency Telephone Encounter Continuity Care Appointment  HPI:   This telephone encounter was created for [name] on 11/27/2019 for the following purpose/cc medication follow-up.  Refer to problem list for Assessment/Plan based charting of this encounter.   Past Medical History:  Past Medical History:  Diagnosis Date  . Chronic renal insufficiency 03/21/2006   CrCl <50 ml  . DDD (degenerative disc disease), cervical   . GERD (gastroesophageal reflux disease) 03/22/2006  . Hand numbness 10/07/2009  . Hyperlipidemia 03/22/2008  . Hypertension   . Hypertension 03/22/2006  . Left ventricular hypertrophy 03/22/2006  . Leg pain, left 11/15/2009  . Lumbar strain 07/15/2008  . Renal insufficiency   . Soft tissue disorder 08/2008  . Tobacco abuse 03/22/2006   2 ppd x 30 years      ROS:   Endorses continued right leg pain with walking. Denies nausea, weakness, drowsiness, lightheadedness.   Assessment / Plan / Recommendations:   Please see A&P under problem oriented charting for assessment of the patient's acute and chronic medical conditions.   As always, pt is advised that if symptoms worsen or new symptoms arise, they should go to an urgent care facility or to to ER for further evaluation.   Consent and Medical Decision Making:   Patient seen with Dr. Dareen Piano  This is a telephone encounter between William Perkins and Foy Guadalajara on 11/27/2019 for medication follow-up. The visit was conducted with the patient located at home and Foy Guadalajara at [facility]. The patient's identity was confirmed using their DOB and current address. The patient has consented to being evaluated through a telephone encounter and understands the associated risks (an examination cannot be done and the patient may need to come in for an appointment) / benefits (allows the patient to remain at home, decreasing exposure to coronavirus). I personally spent 13 minutes on  medical discussion.

## 2019-11-27 NOTE — Assessment & Plan Note (Signed)
Telehealth appointment 11/27/19: Patient reports starting cilostazol twice daily for his claudication of his right leg. He states shortly after taking this medication on 09/17, he began to develop nausea, drowsiness and weakness. Later that evening and the next morning, he took his medication as prescribed and had worsening of these symptoms. He discontinued the cilostazol, and these symptoms resolved the next day. He continues to endorse pain in his right leg with exertion. He has not received a call from vascular surgery for the referral placed at the last visit. -Continue optimal medical therapy of hypertension, diabetes and hyperlipidemia -Await call from vascular surgery for referral -Place referral for supervised exercise therapy program -Advised that patient can take aspirin 81mg  in the setting of CKD

## 2019-11-27 NOTE — Telephone Encounter (Signed)
Tele appt scheduled for today to discuss medication reaction-pt aware.William Perkins, William Gittleman Cassady9/24/202111:10 AM

## 2019-11-30 NOTE — Progress Notes (Addendum)
Internal Medicine Clinic Attending  I evaluated the patient.  I personally confirmed the key portions of the history and exam documented by Dr. Johnson and I reviewed pertinent patient test results.  The assessment, diagnosis, and plan were formulated together and I agree with the documentation in the resident's note.   

## 2019-12-08 ENCOUNTER — Other Ambulatory Visit: Payer: Self-pay

## 2019-12-08 DIAGNOSIS — I739 Peripheral vascular disease, unspecified: Secondary | ICD-10-CM

## 2019-12-25 ENCOUNTER — Ambulatory Visit (INDEPENDENT_AMBULATORY_CARE_PROVIDER_SITE_OTHER): Payer: Medicare HMO | Admitting: Internal Medicine

## 2019-12-25 ENCOUNTER — Other Ambulatory Visit: Payer: Self-pay

## 2019-12-25 ENCOUNTER — Encounter: Payer: Self-pay | Admitting: Internal Medicine

## 2019-12-25 VITALS — BP 123/71 | HR 59 | Temp 98.1°F | Ht 71.0 in | Wt 138.1 lb

## 2019-12-25 DIAGNOSIS — E1122 Type 2 diabetes mellitus with diabetic chronic kidney disease: Secondary | ICD-10-CM | POA: Diagnosis not present

## 2019-12-25 DIAGNOSIS — I739 Peripheral vascular disease, unspecified: Secondary | ICD-10-CM

## 2019-12-25 DIAGNOSIS — I10 Essential (primary) hypertension: Secondary | ICD-10-CM | POA: Diagnosis not present

## 2019-12-25 DIAGNOSIS — N1832 Chronic kidney disease, stage 3b: Secondary | ICD-10-CM

## 2019-12-25 LAB — POCT GLYCOSYLATED HEMOGLOBIN (HGB A1C): Hemoglobin A1C: 5.5 % (ref 4.0–5.6)

## 2019-12-25 LAB — GLUCOSE, CAPILLARY: Glucose-Capillary: 80 mg/dL (ref 70–99)

## 2019-12-25 MED ORDER — SPIRONOLACTONE 25 MG PO TABS: 12.5000 mg | ORAL_TABLET | Freq: Every day | ORAL | 2 refills | Status: DC

## 2019-12-25 NOTE — Patient Instructions (Addendum)
Thank you, Mr.Bronislaw C Chatwin for allowing [place] to provide your care today. Today we discussed blood pressure, peripheral artery disease, chronic kidney disease.    I have ordered the following labs for you:   Lab Orders     BMP8+Anion Gap     POC Hbg A1C   Tests ordered today:  none  Referrals ordered today:   Referral Orders  No referral(s) requested today     I have ordered the following medication/changed the following medications:   Stop the following medications: Medications Discontinued During This Encounter  Medication Reason  . spironolactone (ALDACTONE) 25 MG tablet Reorder     Start the following medications: Meds ordered this encounter  Medications  . spironolactone (ALDACTONE) 25 MG tablet    Sig: Take 0.5 tablets (12.5 mg total) by mouth daily.    Dispense:  15 tablet    Refill:  2     Follow up: 3 months    Remember:   Should you have any questions or concerns please call the internal medicine clinic at [phone].     Marianna Payment, D.O. [facility]   Atherosclerosis  Atherosclerosis is narrowing and hardening of the arteries. Arteries are blood vessels that carry blood from the heart to all parts of the body. This blood contains oxygen. Arteries can become narrow or clogged with a buildup of fat, cholesterol, calcium, and other substances (plaque). Plaque decreases the amount of blood that can flow through the artery. Atherosclerosis can affect any artery in the body, including:  Heart arteries (coronary artery disease). This may cause a heart attack.  Brain arteries. This may cause a stroke (cerebrovascular accident).  Leg, arm, and pelvis arteries (peripheral artery disease). This may cause pain and numbness.  Kidney arteries. This may cause kidney (renal) failure. Treatment may slow the disease and prevent further damage to the heart, brain, peripheral arteries, and kidneys. What are the causes? Atherosclerosis  develops slowly over many years. The inner layers of your arteries become damaged and allow the gradual buildup of plaque. The exact cause of atherosclerosis is not fully understood. Symptoms of atherosclerosis do not occur until the artery becomes narrow or blocked. What increases the risk? The following factors may make you more likely to develop this condition:  High blood pressure.  High cholesterol.  Being middle-aged or older.  Having a family history of atherosclerosis.  Having high blood fats (triglycerides).  Diabetes.  Being overweight.  Smoking tobacco.  Not exercising enough (sedentary lifestyle).  Having a substance in the blood called C-reactive protein (CRP). This is a sign of increased levels of inflammation in the body.  Sleep apnea.  Being stressed.  Drinking too much alcohol. What are the signs or symptoms? This condition may not cause any symptoms. If you have symptoms, they are caused by damage to an area of your body that is not getting enough blood.  Coronary artery disease may cause chest pain and shortness of breath.  Decreased blood supply to your brain may cause a stroke. Signs of a stroke may include sudden: ? Weakness on one side of the body. ? Confusion. ? Changes in vision. ? Inability to speak or understand speech. ? Loss of balance, coordination, or the ability to walk. ? Severe headache. ? Loss of consciousness.  Peripheral arterial disease may cause pain and numbness, often in the legs and hips.  Renal failure may cause fatigue, nausea, swelling, and itchy skin. How is this diagnosed? This condition is  diagnosed based on your medical history and a physical exam. During the exam:  Your health care provider will: ? Check your pulse in different places. ? Listen for a "whooshing" sound over your arteries (bruit).  You may have tests, such as: ? Blood tests to check your levels of cholesterol, triglycerides, and  CRP. ? Electrocardiogram (ECG) to check for heart damage. ? Chest X-ray to see if you have an enlarged heart, which is a sign of heart failure. ? Stress test to see how your heart reacts to exercise. ? Echocardiogram to get images of the inside of your heart. ? Ankle-brachial index to compare blood pressure in your arms to blood pressure in your ankles. ? Ultrasound of your peripheral arteries to check blood flow. ? CT scan to check for damage to your heart or brain. ? X-rays of blood vessels after dye has been injected (angiogram) to check blood flow. How is this treated? Treatment starts with lifestyle changes, which may include:  Changing your diet.  Losing weight.  Reducing stress.  Exercising and being physically active more regularly.  Not smoking. You may also need medicine to:  Lower triglycerides and cholesterol.  Control blood pressure.  Prevent blood clots.  Lower inflammation in your body.  Control your blood sugar. Sometimes, surgery is needed to:  Remove plaque from an artery (endarterectomy).  Open or widen a narrowed heart artery (angioplasty).  Create a new path for your blood with one of these procedures: ? Heart (coronary) artery bypass graft surgery. ? Peripheral artery bypass graft surgery. Follow these instructions at home: Eating and drinking   Eat a heart-healthy diet. Talk with your health care provider or a diet and nutrition specialist (dietitian) if you need help. A heart-healthy diet involves: ? Limiting unhealthy fats and increasing healthy fats. Some examples of healthy fats are olive oil and canola oil. ? Eating plant-based foods, such as fruits, vegetables, nuts, whole grains, and legumes (such as peas and lentils).  Limit alcohol intake to no more than 1 drink a day for nonpregnant women and 2 drinks a day for men. One drink equals 12 oz of beer, 5 oz of wine, or 1 oz of hard liquor. Lifestyle  Follow an exercise program as told  by your health care provider.  Maintain a healthy weight. Lose weight if your health care provider says that you need to do that.  Rest when you are tired.  Learn to manage your stress.  Do not use any products that contain nicotine or tobacco, such as cigarettes and e-cigarettes. If you need help quitting, ask your health care provider.  Do not abuse drugs. General instructions  Take over-the-counter and prescription medicines only as told by your health care provider.  Manage other health conditions as told by your health care provider.  Keep all follow-up visits as told by your health care provider. This is important. Contact a health care provider if:  You have chest pain or discomfort. This includes squeezing chest pain that may feel like indigestion (angina).  You have shortness of breath.  You have an irregular heartbeat.  You have unexplained fatigue.  You have unexplained pain or numbness in an arm, leg, or hip.  You have nausea, swelling of your hands or feet, and itchy skin. Get help right away if:  You have any symptoms of a heart attack, such as: ? Chest pain. ? Shortness of breath. ? Pain in your neck, jaw, arms, back, or stomach. ? Cold sweat. ?  Nausea. ? Light-headedness.  You have any symptoms of a stroke. "BE FAST" is an easy way to remember the main warning signs of a stroke: ? B - Balance. Signs are dizziness, sudden trouble walking, or loss of balance. ? E - Eyes. Signs are trouble seeing or a sudden change in vision. ? F - Face. Signs are sudden weakness or numbness of the face, or the face or eyelid drooping on one side. ? A - Arms. Signs are weakness or numbness in an arm. This happens suddenly and usually on one side of the body. ? S - Speech. Signs are sudden trouble speaking, slurred speech, or trouble understanding what people say. ? T - Time. Time to call emergency services. Write down what time symptoms started.  You have other signs of a  stroke, such as: ? A sudden, severe headache with no known cause. ? Nausea or vomiting. ? Seizure. These symptoms may represent a serious problem that is an emergency. Do not wait to see if the symptoms will go away. Get medical help right away. Call your local emergency services (911 in the [place] Do not drive yourself to the hospital. Summary  Atherosclerosis is narrowing and hardening of the arteries.  Arteries can become narrow or clogged with a buildup of fat, cholesterol, calcium, and other substances (plaque).  This condition may not cause any symptoms. If you do have symptoms, they are caused by damage to an area of your body that is not getting enough blood.  Treatment may include lifestyle changes and medicines. In some cases, surgery is needed. This information is not intended to replace advice given to you by your health care provider. Make sure you discuss any questions you have with your health care provider. Document Revised: 05/31/2017 Document Reviewed: 10/25/2016 Elsevier Patient Education  [place].  Intermittent Claudication Intermittent claudication is pain in one or both legs that occurs when walking or exercising and goes away when resting. Intermittent claudication is a symptom of peripheral arterial disease (PAD). This condition is commonly treated with rest, medicine, and healthy lifestyle changes. If medical management does not improve symptoms, surgery can be done to restore blood flow (revascularization) to the affected leg. What are the causes?  This condition is caused by buildup of fatty material (plaque) within the major arteries in the body (atherosclerosis). Plaque makes arteries stiff and narrow, which prevents enough blood from reaching the leg muscles. Pain occurs when you walk or exercise because your muscles need (but cannot get) more blood when you are moving and exercising. What increases the risk? The following factors may make you more  likely to develop this condition:  A family history of atherosclerosis.  A personal history of stroke or heart disease.  Older age.  Being inactive (sedentary lifestyle).  Being overweight.  Smoking cigarettes.  Having another health condition such as: ? Diabetes. ? High blood pressure. ? High cholesterol. What are the signs or symptoms? Symptoms of this condition may first develop in the lower leg, and then they may spread to the thigh, hip, buttock, or the back of the lower leg (calf) over time. Symptoms may include:  Aches or pains.  Cramps.  A feeling of tightness, weakness, or heaviness.  A wound on the lower leg or foot that heals poorly or does not heal. How is this diagnosed? This condition may be diagnosed based on:  Your symptoms.  Your medical history.  Tests, such as: ? Blood tests. ? Arterial duplex ultrasound.  This test uses images of blood vessels and surrounding organs to evaluate blood flow within arteries. ? Angiogram. In this procedure, dye is injected into arteries and then X-rays are taken. ? Magnetic resonance angiogram (MRA). In this procedure, strong magnets and radio waves are used instead of X-rays to create images of blood vessels and blood flow. ? CT angiogram (CTA). In this procedure, a large X-ray machine called a CT scanner takes detailed pictures of blood vessels that have been injected with dye. ? Ankle-brachial index (ABI) test. This procedure measures blood pressure in the leg during exercise and at rest. ? Exercise test. For this test, you will walk on a treadmill while tests are done (such as the ABI test) to evaluate how this condition affects your ability to walk or exercise. How is this treated? Treatment for this condition may involve treatment for the underlying cause, such as treatment for high blood pressure, high cholesterol, or diabetes. Treatment may include:  Lifestyle changes such as: ? Starting a supervised or home-based  exercise program. ? Losing weight. ? Quitting smoking.  Medicines to help restore blood flow through your legs.  Blood vessel surgery (angioplasty) to restore blood flow around the blocked vessel. This is also known as endovascular therapy (EVT). This is only done if your intermittent claudication is caused by severe peripheral artery disease, a condition in which blood flow is severely or totally restricted by the narrowing of the arteries. Follow these instructions at home: Lifestyle   Maintain a healthy weight.  Eat a diet that is low in saturated fats and calories. Consider working with a diet and nutrition specialist (dietitian) to help you make healthy food choices.  Do not use any products that contain nicotine or tobacco, such as cigarettes and e-cigarettes. If you need help quitting, ask your health care provider.  If your health care provider recommended an exercise program for you, follow it as directed. Your exercise program may involve: ? Walking 3 or more times a week. ? Walking until you have certain symptoms of intermittent claudication. ? Resting until symptoms go away. ? Gradually increasing your walking time to about 50 minutes a day. General instructions  Work with your health care provider to manage any other health conditions you may have, including diabetes, high blood pressure, or high cholesterol.  Take over-the-counter and prescription medicines only as told by your health care provider.  Keep all follow-up visits as told by your health care provider. This is important. Contact a health care provider if:  Your pain does not go away with rest.  You have sores on your legs that do not heal or have a bad smell or pus coming from them.  Your condition gets worse or does not get better with treatment. Get help right away if:  You have chest pain.  You have difficulty breathing.  You develop arm weakness.  You have trouble speaking.  Your face begins  to droop.  Your foot or leg is cold or it changes color.  Your foot or leg becomes numb. These symptoms may represent a serious problem that is an emergency. Do not wait to see if the symptoms will go away. Get medical help right away. Call your local emergency services (911 in the [place] Do not drive yourself to the hospital.  Summary  Intermittent claudication is pain in one or both legs that occurs when walking or exercising and goes away when resting.  This condition is caused by buildup of fatty material (  plaque) within the major arteries in the body (atherosclerosis). Plaque makes arteries stiff and narrow, which prevents enough blood from reaching the leg muscles.  Intermittent claudication can be treated with medicine and lifestyle changes. If medical treatment fails, surgery can be done to help return blood flow to the affected area.  Make sure you work with your health care provider to manage any other health conditions you may have, including diabetes, high blood pressure, or high cholesterol. This information is not intended to replace advice given to you by your health care provider. Make sure you discuss any questions you have with your health care provider. Document Revised: 02/01/2017 Document Reviewed: 03/22/2016 Elsevier Patient Education  2020 [employer].

## 2019-12-25 NOTE — Progress Notes (Signed)
CC: HTN  HPI:  [name] is a 69 y.o. male with a past medical history stated below and presents today for HTN. Please see problem based assessment and plan for additional details.  Past Medical History:  Diagnosis Date  . Chronic renal insufficiency 03/21/2006   CrCl <50 ml  . DDD (degenerative disc disease), cervical   . GERD (gastroesophageal reflux disease) 03/22/2006  . Hand numbness 10/07/2009  . Hyperlipidemia 03/22/2008  . Hypertension   . Hypertension 03/22/2006  . Left ventricular hypertrophy 03/22/2006  . Leg pain, left 11/15/2009  . Lumbar strain 07/15/2008  . Renal insufficiency   . Soft tissue disorder 08/2008  . Tobacco abuse 03/22/2006   2 ppd x 30 years    Current Outpatient Medications on File Prior to Visit  Medication Sig Dispense Refill  . amLODipine (NORVASC) 10 MG tablet Take 1 tablet (10 mg total) by mouth daily. 30 tablet 11  . aspirin EC 81 MG tablet Take 1 tablet (81 mg total) by mouth daily. Swallow whole. 90 tablet 0  . cloNIDine (CATAPRES) 0.2 MG tablet Take 1 tablet (0.2 mg total) by mouth 2 (two) times daily. 60 tablet 11  . hydrALAZINE (APRESOLINE) 100 MG tablet Take 1 tablet (100 mg total) by mouth 2 (two) times daily. 90 tablet 3  . lisinopril (ZESTRIL) 40 MG tablet Take 1 tablet (40 mg total) by mouth daily. 30 tablet 11  . polyethylene glycol (MIRALAX / GLYCOLAX) 17 g packet Take 17 g by mouth 2 (two) times daily. 14 each 25  . pravastatin (PRAVACHOL) 40 MG tablet TAKE 1 TABLET BY MOUTH ONCE DAILY IN THE EVENING 90 tablet 0  . psyllium (METAMUCIL) 58.6 % packet Take 1 packet by mouth daily.    . [DISCONTINUED] lisinopril-hydrochlorothiazide (PRINZIDE,ZESTORETIC) 20-12.5 MG per tablet Take 2 tablets by mouth daily.     No current facility-administered medications on file prior to visit.    Family History  Problem Relation Age of Onset  . Diabetes Mother   . Diabetes Sister   . Diabetes Brother   . Heart disease Brother   .  Rectal cancer Neg Hx   . Stomach cancer Neg Hx   . Colon cancer Neg Hx     Social History   Socioeconomic History  . Marital status: Married    Spouse name: Not on file  . Number of children: Not on file  . Years of education: 34  . Highest education level: Not on file  Occupational History  . Not on file  Tobacco Use  . Smoking status: Former Smoker    Packs/day: 1.00    Years: 44.00    Pack years: 44.00    Types: Cigarettes    Quit date: 11/21/2013    Years since quitting: 6.1  . Smokeless tobacco: Never Used  . Tobacco comment: Stopped 11/21/2013   Vaping Use  . Vaping Use: Never used  Substance and Sexual Activity  . Alcohol use: No    Alcohol/week: 0.0 standard drinks    Comment: Quit 2015  . Drug use: No  . Sexual activity: Yes    Partners: Female  Other Topics Concern  . Not on file  Social History Narrative  . Not on file   Social Determinants of Health   Financial Resource Strain:   . Difficulty of Paying Living Expenses: Not on file  Food Insecurity:   . Worried About Charity fundraiser in the Last Year: Not on file  .  Ran Out of Food in the Last Year: Not on file  Transportation Needs:   . Lack of Transportation (Medical): Not on file  . Lack of Transportation (Non-Medical): Not on file  Physical Activity:   . Days of Exercise per Week: Not on file  . Minutes of Exercise per Session: Not on file  Stress:   . Feeling of Stress : Not on file  Social Connections:   . Frequency of Communication with Friends and Family: Not on file  . Frequency of Social Gatherings with Friends and Family: Not on file  . Attends Religious Services: Not on file  . Active Member of Clubs or Organizations: Not on file  . Attends Archivist Meetings: Not on file  . Marital Status: Not on file  Intimate Partner Violence:   . Fear of Current or Ex-Partner: Not on file  . Emotionally Abused: Not on file  . Physically Abused: Not on file  . Sexually Abused: Not  on file    Review of Systems: ROS negative except for what is noted on the assessment and plan.  Vitals:   12/25/19 1503  BP: 123/71  Pulse: (!) 59  Temp: 98.1 F (36.7 C)  TempSrc: Oral  SpO2: 100%  Weight: 138 lb 1.6 oz (62.6 kg)  Height: 5\' 11"  (1.803 m)     Physical Exam: Physical Exam Constitutional:      Appearance: Normal appearance.  HENT:     Head: Normocephalic and atraumatic.  Cardiovascular:     Rate and Rhythm: Normal rate.     Pulses: Normal pulses.     Heart sounds: Normal heart sounds.  Pulmonary:     Effort: Pulmonary effort is normal.     Breath sounds: Normal breath sounds.  Musculoskeletal:        General: Normal range of motion.     Cervical back: Normal range of motion.     Right lower leg: No edema.     Left lower leg: No edema.  Skin:    General: Skin is warm and dry.  Neurological:     Mental Status: He is alert and oriented to person, place, and time. Mental status is at baseline.  Psychiatric:        Mood and Affect: Mood normal.      Assessment & Plan:   See Encounters Tab for problem based charting.  Patient discussed with Dr. Lars Mage, D.O. [facility], PGY-2 Pager: [phone], Phone: [phone] Date 12/28/2019 Time 6:40 AM

## 2019-12-26 LAB — BMP8+ANION GAP
Anion Gap: 16 mmol/L (ref 10.0–18.0)
BUN/Creatinine Ratio: 17 (ref 10–24)
BUN: 35 mg/dL — ABNORMAL HIGH (ref 8–27)
CO2: 19 mmol/L — ABNORMAL LOW (ref 20–29)
Calcium: 10.1 mg/dL (ref 8.6–10.2)
Chloride: 103 mmol/L (ref 96–106)
Creatinine, Ser: 2.05 mg/dL — ABNORMAL HIGH (ref 0.76–1.27)
GFR calc Af Amer: 37 mL/min/{1.73_m2} — ABNORMAL LOW (ref >59)
GFR calc non Af Amer: 32 mL/min/{1.73_m2} — ABNORMAL LOW (ref >59)
Glucose: 80 mg/dL (ref 65–99)
Potassium: 4.4 mmol/L (ref 3.5–5.2)
Sodium: 138 mmol/L (ref 134–144)

## 2019-12-28 NOTE — Progress Notes (Signed)
Internal Medicine Clinic Attending  Case discussed with Dr. Coe  At the time of the visit.  We reviewed the resident's history and exam and pertinent patient test results.  I agree with the assessment, diagnosis, and plan of care documented in the resident's note.  

## 2019-12-28 NOTE — Assessment & Plan Note (Signed)
Patient presents today for reevaluation of his hypertension.  Patient's blood pressure is well controlled today at 123/71 on clonidine 0.2 mg tablets twice daily, amlodipine 10 mg daily, hydralazine 100 mg 2 times daily, spironolactone 12.5 mg daily, lisinopril 40 mg daily.  I cannot see where this patient has been worked up for his resistant hypertension.  He has been seen by nephrology in the past but has not had a follow-up visit in over a year.  Patient will likely need further investigation into his resistant hypertension.  Thus, he is well controlled today and denies any side effects from his medications.  Plan: -Continue current antihypertensive medication -Work-up resistant hypertension at subsequent visit -Refer patient back to [facility].

## 2019-12-28 NOTE — Assessment & Plan Note (Signed)
Patient has a history of type 2 diabetes.  A repeat A1c and BMP today.  I discussed annual eye exam and diabetic foot exam today.  Patient is not currently taking any medication for diabetes.  A1c is 5.5.  And his random serum blood glucose is in the 80s.    Plan: -Continue to hold off on medication management for diabetes at this time.

## 2019-12-29 NOTE — Progress Notes (Signed)
Patient called.  Patient aware.  

## 2020-01-01 ENCOUNTER — Ambulatory Visit (HOSPITAL_COMMUNITY)
Admission: RE | Admit: 2020-01-01 | Discharge: 2020-01-01 | Disposition: A | Discharge status: Home / Self Care | Payer: Medicare HMO | Source: Ambulatory Visit | Attending: Vascular Surgery | Admitting: Vascular Surgery

## 2020-01-01 ENCOUNTER — Other Ambulatory Visit: Payer: Self-pay | Admitting: Internal Medicine

## 2020-01-01 ENCOUNTER — Other Ambulatory Visit: Payer: Self-pay

## 2020-01-01 ENCOUNTER — Ambulatory Visit (INDEPENDENT_AMBULATORY_CARE_PROVIDER_SITE_OTHER): Payer: Medicare HMO | Admitting: Vascular Surgery

## 2020-01-01 ENCOUNTER — Encounter: Payer: Self-pay | Admitting: Vascular Surgery

## 2020-01-01 VITALS — BP 162/69 | HR 52 | Temp 98.3°F | Resp 20 | Ht 71.0 in | Wt 140.8 lb

## 2020-01-01 DIAGNOSIS — I10 Essential (primary) hypertension: Secondary | ICD-10-CM

## 2020-01-01 DIAGNOSIS — I739 Peripheral vascular disease, unspecified: Secondary | ICD-10-CM

## 2020-01-01 DIAGNOSIS — I70213 Atherosclerosis of native arteries of extremities with intermittent claudication, bilateral legs: Secondary | ICD-10-CM

## 2020-01-01 NOTE — Assessment & Plan Note (Signed)
Patient presents for reevaluation of his peripheral artery disease.  Previous office visit patient was referred to physical therapy for exercise therapy for claudication secondary to peripheral artery disease.  During today's visit I discussed his peripheral artery disease and how risk factor mitigation and decrease his risk of progression of his peripheral artery disease and cardiovascular disease.  Specifically decreased smoking, controlling his blood pressure and diabetes, decreasing cholesterol and lifestyle modifications such as diet and exercise.  I will provide additional information regarding his cardiovascular risk factors that are contributing to his peripheral artery disease today.  Plan: -Physical therapy for claudication symptoms secondary to peripheral artery disease.

## 2020-01-01 NOTE — Progress Notes (Signed)
[facility] Office Evaluation [name] 04-16-1950 [id number]  Chief Complaint: Right leg pain  History of Present Illness: William Perkins is a 69 y.o. male who presents to the office for evaluation of right leg pain with ambulation.  He reports the pain has been present for several weeks.  The pain is only present when he ambulates.  It is located over the anterior aspect of his right lower leg.  The pain resolves after he stops ambulating.  He reports the foot also feels tight on the right side when he ambulates.  He is able to walk about 20-50 feet before pain starts.  He denies any symptoms consistent with ischemic rest pain.  He denies any history of ischemic ulceration.  He is a former smoker having quit in 2015.  He has a family history significant for coronary artery disease.  Past Medical History:  Diagnosis Date  . Chronic renal insufficiency 03/21/2006   CrCl <50 ml  . DDD (degenerative disc disease), cervical   . GERD (gastroesophageal reflux disease) 03/22/2006  . Hand numbness 10/07/2009  . Hyperlipidemia 03/22/2008  . Hypertension   . Hypertension 03/22/2006  . Left ventricular hypertrophy 03/22/2006  . Leg pain, left 11/15/2009  . Lumbar strain 07/15/2008  . Renal insufficiency   . Soft tissue disorder 08/2008  . Tobacco abuse 03/22/2006   2 ppd x 30 years    Past Surgical History:  Procedure Laterality Date  . COLONOSCOPY    . WISDOM TOOTH EXTRACTION      Family History  Problem Relation Age of Onset  . Diabetes Mother   . Diabetes Sister   . Diabetes Brother   . Heart disease Brother   . Rectal cancer Neg Hx   . Stomach cancer Neg Hx   . Colon cancer Neg Hx     Family history significant for: Stroke: No Heart attack: Yes Aneurysm: No Bleeding / Clotting disorder: No  Social History   Tobacco Use  . Smoking status: Former Smoker    Packs/day: 1.00    Years: 44.00    Pack years: 44.00    Types: Cigarettes     Quit date: 11/21/2013    Years since quitting: 6.1  . Smokeless tobacco: Never Used  . Tobacco comment: Stopped 11/21/2013   Vaping Use  . Vaping Use: Never used  Substance Use Topics  . Alcohol use: No    Alcohol/week: 0.0 standard drinks    Comment: Quit 2015  . Drug use: No   Patient in clinic with his wife.  He is functionally independent.  He is somewhat limited by his difficulty with ambulation.   Current Outpatient Medications on File Prior to Visit  Medication Sig Dispense Refill  . amLODipine (NORVASC) 10 MG tablet Take 1 tablet (10 mg total) by mouth daily. 30 tablet 11  . aspirin EC 81 MG tablet Take 1 tablet (81 mg total) by mouth daily. Swallow whole. 90 tablet 0  . cilostazol (PLETAL) 100 MG tablet Take 100 mg by mouth 2 (two) times daily.    . cloNIDine (CATAPRES) 0.2 MG tablet Take 1 tablet (0.2 mg total) by mouth 2 (two) times daily. 60 tablet 11  . hydrALAZINE (APRESOLINE) 100 MG tablet Take 1 tablet (100 mg total) by mouth 2 (two) times daily. 90 tablet 3  . lisinopril (ZESTRIL) 40 MG tablet Take 1 tablet (40 mg total) by mouth daily. 30 tablet 11  . polyethylene glycol (MIRALAX / GLYCOLAX) 17  g packet Take 17 g by mouth 2 (two) times daily. 14 each 25  . pravastatin (PRAVACHOL) 40 MG tablet TAKE 1 TABLET BY MOUTH ONCE DAILY IN THE EVENING 90 tablet 0  . psyllium (METAMUCIL) 58.6 % packet Take 1 packet by mouth daily.    Marland Kitchen spironolactone (ALDACTONE) 25 MG tablet Take 0.5 tablets (12.5 mg total) by mouth daily. 15 tablet 2  . [DISCONTINUED] lisinopril-hydrochlorothiazide (PRINZIDE,ZESTORETIC) 20-12.5 MG per tablet Take 2 tablets by mouth daily.     No current facility-administered medications on file prior to visit.   Aspirin is listed on his medications chart, but he says he is not taking this currently.  No anticoagulant drugs. (+) statin use. No insulin dependence. No home oxygen dependence.  No Known Allergies No contrast allergy.  Review of  Systems: Constitutional: No recent weight loss/gain. . Reports he can climb a flight of stairs. Neurologic: No stroke/TIA.  No headaches.  No seizures. Psychiatric: No depression / anxiety.  No hallucinations.  Cardiovascular: No chest pain with activity.  No palpitations. (+) claudication.  No ischemic rest pain.  No ischemic ulceration.  Respiratory: No dypsnea at rest.  No wheezing.  Gastrointestinal: No abdominal pain.  No postprandial pain.  Genitourinary: Does not make urine.  No dysuria / pyuria / foul odor in urine.  Hematologic: No easy bruising / bleeding. Skin: No rash.  No infection.  No ulceration. Extremity: No edema.  No cyanosis.  No pallor. Lymphatic: No lymphedema.  No lymphadenopathy.  Physical Exam: BP (!) 162/69 (BP Location: Left Arm, Patient Position: Sitting, Cuff Size: Normal)   Pulse (!) 52   Temp 98.3 F (36.8 C) (Temporal)   Resp 20   Ht 5\' 11"  (1.803 m)   Wt 140 lb 12.8 oz (63.9 kg)   SpO2 97%   BMI 19.64 kg/m   Constitutional: Thin, elderly man appearing in no distress. Appears under nourished.  Neurologic: Normal gait and station. CN intact.  No weakness.  No sensory loss. Psychiatric: Mood and affect symmetric and appropriate. Eyes: No icterus.  No conjunctival pallor. Ears, nose, throat: mucous membranes moist.  Midline trachea.  Cardiac: regular rate and rhythm.  Respiratory: Unlabored.  Symmetric chest rise. Abdominal: soft, non-tender, non-distended.  No palpable pulsatile abdominal mass. Peripheral vascular:  Radial pulse: L 2+ / R 2+  Femoral pulse: L 2+ / R 2+  Popliteal pulse: L not palpable / R not palpable  Dorsalis pedis pulse: L absent / R absent  Posterior tibial pulse: L absent / R absent Extremity: No edema. No cyanosis. No pallor.  Skin: No gangrene. No ulceration.  Lymphatic: No Stemmer's sign. No palpable lymphadenopathy.  Diagnostics: CBC CBC Latest Ref Rng & Units 10/16/2018 04/07/2018 12/13/2017  WBC 3.4 - 10.8 x10E3/uL  7.0 8.5 7.4  Hemoglobin 13.0 - 17.7 g/dL 11.7(L) 11.8(L) 11.8(L)  Hematocrit 37.5 - 51.0 % 37.0(L) 35.7(L) 36.2(L)  Platelets 150 - 450 x10E3/uL 240 289 342     CMP CMP Latest Ref Rng & Units 12/25/2019 09/03/2019 01/22/2019  Glucose 65 - 99 mg/dL 80 117(H) 107(H)  BUN 8 - 27 mg/dL 35(H) 27 30(H)  Creatinine 0.76 - 1.27 mg/dL 2.05(H) 2.08(H) 2.02(H)  Sodium 134 - 144 mmol/L 138 140 138  Potassium 3.5 - 5.2 mmol/L 4.4 4.6 4.1  Chloride 96 - 106 mmol/L 103 105 101  CO2 20 - 29 mmol/L 19(L) 20 19(L)  Calcium 8.6 - 10.2 mg/dL 10.1 10.0 9.8  Total Protein 6.0 - 8.5 g/dL - - -  Total Bilirubin 0.0 - 1.2 mg/dL - - -  Alkaline Phos 39 - 117 IU/L - - -  AST 0 - 40 IU/L - - -  ALT 0 - 44 IU/L - - -   Vascular Imaging: ABI 01/01/20  I personally reviewed the above study. It is significant for hemodynamically significant occlusive disease above the level of the tibial vessels bilaterally right greater than left..   Assessment / Plan: 69 y.o. male with right greater than left lower extremity peripheral arterial disease causing intermittent claudication on the right.  I counseled the patient about the expected course and the benign nature of this disease.  Given he is no longer smoking and is compliant with best medical therapy his chance of improving is good.  I counseled him that an exercise regimen is helpful to improve his symptoms.  I instructed the patient to walk daily to and past the point of discomfort.  I encouraged his wife to keep a journal of his progress.  He should continue aspirin 81 mg by mouth daily, and high intensity statin therapy.  He can continue to take Pletal.  He seems to have good control of his other chronic medical conditions.  Follow-up in 6 months with me to monitor his symptoms.  Will only consider intervention should his symptoms worsen despite optimal medical therapy, or if his symptoms become lifestyle limiting after lengthy trial of conservative  therapy.  Yevonne Aline. Stanford Breed, MD [facility] 01/01/2020

## 2020-01-06 ENCOUNTER — Other Ambulatory Visit: Payer: Self-pay | Admitting: Internal Medicine

## 2020-01-06 DIAGNOSIS — I10 Essential (primary) hypertension: Secondary | ICD-10-CM

## 2020-01-07 NOTE — Telephone Encounter (Signed)
William Perkins, he is on a higher dose of the clonidine now (as per his medication list he is taking 0.2mg  tablets twice a day) so I will not refill this prescription of 0.1mg .

## 2020-01-08 ENCOUNTER — Other Ambulatory Visit: Payer: Self-pay | Admitting: Internal Medicine

## 2020-01-08 DIAGNOSIS — I10 Essential (primary) hypertension: Secondary | ICD-10-CM

## 2020-01-11 ENCOUNTER — Other Ambulatory Visit: Payer: Self-pay | Admitting: Internal Medicine

## 2020-01-11 DIAGNOSIS — I10 Essential (primary) hypertension: Secondary | ICD-10-CM

## 2020-01-13 ENCOUNTER — Other Ambulatory Visit: Payer: Self-pay | Admitting: Internal Medicine

## 2020-01-13 DIAGNOSIS — I10 Essential (primary) hypertension: Secondary | ICD-10-CM

## 2020-01-13 MED ORDER — CLONIDINE HCL 0.2 MG PO TABS: 0.2000 mg | ORAL_TABLET | Freq: Two times a day (BID) | ORAL | 11 refills | Status: DC

## 2020-01-13 NOTE — Telephone Encounter (Signed)
Pt states he is still waiting for a refill on cloNIDine (CATAPRES) 0.2 MG tablet. Please call pt back.

## 2020-02-03 ENCOUNTER — Other Ambulatory Visit: Payer: Self-pay | Admitting: Internal Medicine

## 2020-02-03 DIAGNOSIS — E782 Mixed hyperlipidemia: Secondary | ICD-10-CM

## 2020-02-12 ENCOUNTER — Encounter: Payer: Self-pay | Admitting: Student

## 2020-02-12 NOTE — Progress Notes (Signed)
Regarding supervised exercise therapy for patient's peripheral vascular disease and claudication:  Due to staffing shortages, patient will not be able to undergo in-person SET. Alternative options provided by SET:  -Pay $68 monthly to exercise in their gym, however this would be unsupervised. -Enroll in a free cardiac rehab virtual program: patient would need smart device and reliable Internet. He would have an initial evaluation with exercise physiologist and RN, complete a walk test, and then receive a exercise prescription which he would complete independently at home. He would log his exercises on an app at home.  Contacted patient to discuss these options, however patient did not answer. If unable to reach patient today, patient may discuss if either of these options would be of interest with Dr. Gilford Rile at follow-up visit on 02/15/2020. If he is not interested in either option, patient may consider just completing exercise regimen suggested by vascular surgery at recent appointment.

## 2020-02-15 ENCOUNTER — Telehealth: Payer: Self-pay | Admitting: *Deleted

## 2020-02-15 ENCOUNTER — Encounter: Payer: Medicare HMO | Admitting: Internal Medicine

## 2020-02-15 NOTE — Telephone Encounter (Signed)
Attempted to contact patient for today's missed appt. No answer. Left detailed message on patient self-identified VM to call back to r/s. Hubbard Hartshorn, BSN, RN-BC

## 2020-04-05 ENCOUNTER — Other Ambulatory Visit: Payer: Self-pay | Admitting: Internal Medicine

## 2020-04-05 DIAGNOSIS — I10 Essential (primary) hypertension: Secondary | ICD-10-CM

## 2020-04-07 ENCOUNTER — Other Ambulatory Visit: Payer: Self-pay

## 2020-04-07 ENCOUNTER — Ambulatory Visit (INDEPENDENT_AMBULATORY_CARE_PROVIDER_SITE_OTHER): Payer: Medicare Other | Admitting: Student

## 2020-04-07 ENCOUNTER — Encounter: Payer: Self-pay | Admitting: Student

## 2020-04-07 VITALS — BP 158/78 | HR 76 | Temp 98.2°F | Ht 71.0 in | Wt 147.9 lb

## 2020-04-07 DIAGNOSIS — I739 Peripheral vascular disease, unspecified: Secondary | ICD-10-CM

## 2020-04-07 DIAGNOSIS — I10 Essential (primary) hypertension: Secondary | ICD-10-CM

## 2020-04-07 DIAGNOSIS — E782 Mixed hyperlipidemia: Secondary | ICD-10-CM | POA: Diagnosis not present

## 2020-04-07 DIAGNOSIS — N1832 Chronic kidney disease, stage 3b: Secondary | ICD-10-CM

## 2020-04-07 DIAGNOSIS — E1122 Type 2 diabetes mellitus with diabetic chronic kidney disease: Secondary | ICD-10-CM

## 2020-04-07 MED ORDER — ASPIRIN EC 81 MG PO TBEC: 81.0000 mg | DELAYED_RELEASE_TABLET | Freq: Every day | ORAL | 0 refills | Status: AC

## 2020-04-07 MED ORDER — SPIRONOLACTONE 25 MG PO TABS: 25.0000 mg | ORAL_TABLET | Freq: Every day | ORAL | 2 refills | Status: DC

## 2020-04-07 MED ORDER — ASPIRIN EC 81 MG PO TBEC: 81.0000 mg | DELAYED_RELEASE_TABLET | Freq: Every day | ORAL | 0 refills | Status: DC

## 2020-04-07 NOTE — Patient Instructions (Addendum)
Mr. Ekstein,  It was a pleasure seeing you in clinic today.  For your right foot pain: We are most concerned that this pain is a result of your peripheral artery disease. We request that you continue to take pravastatin 40mg  daily as well as start taking aspirin 81mg  daily. We request that you follow-up with the vascular surgery team for repeat evaluation (Dr. Stanford Breed with [facility])  We have also increased your prescribed dose of spironolactone from 12.5mg  to 25mg . Please continue following closely with nephrology for management of your chronic kidney disease.  Sincerely, Dr. Paulla Dolly, MD

## 2020-04-07 NOTE — Assessment & Plan Note (Signed)
Since last office visit, patient has been evaluated by Vascular Surgery with plan to continue aspirin, statin and cilostazol. Patient advised to engage in daily walking and follow-up in six months. Supervised exercise therapy referral was unsuccessful as facility was unable to provide service to the patient due to barriers with staffing. Patient continues to endorse ongoing symptoms of claudication in his right leg with walking long distances. Over the past two weeks, patient has developed worsening pain in the distal digits of his right foot (see media tab for images of the right foot compared to left foot). On physical examination, digits of the right foot are erythematous, cold to touch and tender to palpation. There are no signs of ulceration of the foot. Dorsalis and posterior tibialis pulses are weak to absent. Patient has intact sensation and full range of motion of the digits.  ASSESSMENT/PLAN: Patient is experiencing progression of his peripheral arterial disease of his right lower extremity. Although vascular surgery recommended continuation of aspirin, pravastatin and cilostazol, patient has not been taking aspirin and he was unable to tolerate cilostazol due to dizziness and lightheadedness following initiation of this medication. Patient would benefit from repeat evaluation as soon as possible of his right lower extremity for consideration of further intervention. -Advised patient to call vascular surgery office, provided phone number -Start aspirin 81mg  daily -Continue pravastatin 40mg  daily -Continue daily walking -Keep soft shoes on feet, keep feet warm, monitor for signs of ulceration/wounds

## 2020-04-07 NOTE — Progress Notes (Signed)
   CC: Follow-up PAD  HPI:  [name] is a 70 y.o. with past medical history significant for resistant HTN, CKD3b, HLD, PAD who presents to clinic for routine follow-up. Refer to problem list for charting of this encounter.  Past Medical History:  Diagnosis Date  . Chronic renal insufficiency 03/21/2006   CrCl <50 ml  . DDD (degenerative disc disease), cervical   . GERD (gastroesophageal reflux disease) 03/22/2006  . Hand numbness 10/07/2009  . Hyperlipidemia 03/22/2008  . Hypertension   . Hypertension 03/22/2006  . Left ventricular hypertrophy 03/22/2006  . Leg pain, left 11/15/2009  . Lumbar strain 07/15/2008  . Renal insufficiency   . Soft tissue disorder 08/2008  . Tobacco abuse 03/22/2006   2 ppd x 30 years   Review of Systems:  Endorses pain in right toes with palpation, endorses pain in right lower extremity with ambulation. Denies loss of sensation, ulceration, wounds, swelling, injury, fevers, chills, chest pain, shortness of breath, headaches, dizziness, lightheadedness.  Physical Exam:  Vitals:   04/07/20 1319  BP: (!) 158/78  Pulse: 76  Temp: 98.2 F (36.8 C)  TempSrc: Oral  SpO2: 100%  Weight: 147 lb 14.4 oz (67.1 kg)  Height: 5\' 11"  (1.803 m)   Physical Exam Constitutional:      General: He is not in acute distress.    Appearance: Normal appearance.  Cardiovascular:     Rate and Rhythm: Normal rate and regular rhythm.     Pulses: Normal pulses.     Heart sounds: Normal heart sounds.  Pulmonary:     Effort: Pulmonary effort is normal. No respiratory distress.     Breath sounds: Normal breath sounds.  Musculoskeletal:        General: Tenderness present.     Comments: Distal digits of the right lower extremity are tender to palpation.  Skin:    Findings: Erythema present.     Comments: Distal aspects of the right lower extremity are cold and erythematous. No ulceration or wounds. (SEE MEDIA TAB)  Neurological:     Mental Status: He is  alert.     Sensory: No sensory deficit.     Motor: No weakness.     Comments: No sensory deficit or weakness of the right lower extremity    Assessment & Plan:   See Encounters Tab for problem based charting.  Patient discussed with Dr. Jimmye Norman

## 2020-04-07 NOTE — Assessment & Plan Note (Signed)
Patient's blood pressure elevated in clinic to 158/78. Patient's current antihypertensive regimen includes amlodipine 10mg  daily, clonidine 0.2mg  twice daily, hydralazine 100mg  twice daily, lisinopril 40mg  daily and spironolactone 12.5mg  daily. Despite this regimen, patient continues to have poorly controlled hypertension. Patient has re-established care with [facility] and was most recently evaluated in November, three months prior to today's visit. At that visit, patient's blood pressure was within goal (112/60) with patient endorsing symptoms concerning for orthostasis. Patient appears to be adherent to current regimen and able to recite his medications. -Increase spironolactone from 12.5 to 25mg  daily -Continue amlodipine 10mg  daily -Continue clonidine 0.2mg  twice daily -Continue hydralazine 100mg  twice daily -Continue lisinopril 40mg  daily

## 2020-04-07 NOTE — Assessment & Plan Note (Signed)
Patient re-established care with [facility] with last office visit in November revealing creatinine of 1.78 and GFR 47. -Continue following with nephrology -BMP today

## 2020-04-07 NOTE — Assessment & Plan Note (Signed)
Patient continues to remain adherent to pravastatin 40mg  daily -Continue pravastatin 40mg  daily -Obtain fasting lipid profile at next visit

## 2020-04-08 LAB — BMP8+ANION GAP
Anion Gap: 17 mmol/L (ref 10.0–18.0)
BUN/Creatinine Ratio: 17 (ref 10–24)
BUN: 31 mg/dL — ABNORMAL HIGH (ref 8–27)
CO2: 18 mmol/L — ABNORMAL LOW (ref 20–29)
Calcium: 9.3 mg/dL (ref 8.6–10.2)
Chloride: 102 mmol/L (ref 96–106)
Creatinine, Ser: 1.8 mg/dL — ABNORMAL HIGH (ref 0.76–1.27)
GFR calc Af Amer: 43 mL/min/{1.73_m2} — ABNORMAL LOW (ref >59)
GFR calc non Af Amer: 37 mL/min/{1.73_m2} — ABNORMAL LOW (ref >59)
Glucose: 186 mg/dL — ABNORMAL HIGH (ref 65–99)
Potassium: 4.6 mmol/L (ref 3.5–5.2)
Sodium: 137 mmol/L (ref 134–144)

## 2020-04-08 NOTE — Assessment & Plan Note (Signed)
ADDENDUM: Patient's postprandial glucose on BMP revealed blood glucose of 186. Patient's hemoglobin A1c's on last several office visits have been <6.4 and patient not currently prescribed medication for this condition. Patient would benefit from repeating HbA1c at next office visit. -Repeat HbA1c at next office visit

## 2020-04-11 NOTE — Progress Notes (Signed)
Internal Medicine Clinic Attending  Case discussed with Dr. Johnson  At the time of the visit.  We reviewed the resident's history and exam and pertinent patient test results.  I agree with the assessment, diagnosis, and plan of care documented in the resident's note.  

## 2020-05-04 ENCOUNTER — Encounter: Payer: Self-pay | Admitting: Internal Medicine

## 2020-05-04 ENCOUNTER — Ambulatory Visit (INDEPENDENT_AMBULATORY_CARE_PROVIDER_SITE_OTHER): Payer: Medicare Other | Admitting: Internal Medicine

## 2020-05-04 ENCOUNTER — Telehealth: Payer: Self-pay

## 2020-05-04 DIAGNOSIS — E782 Mixed hyperlipidemia: Secondary | ICD-10-CM

## 2020-05-04 DIAGNOSIS — Z860100 Personal history of colon polyps, unspecified: Secondary | ICD-10-CM

## 2020-05-04 DIAGNOSIS — M5442 Lumbago with sciatica, left side: Secondary | ICD-10-CM | POA: Diagnosis not present

## 2020-05-04 DIAGNOSIS — N1832 Chronic kidney disease, stage 3b: Secondary | ICD-10-CM

## 2020-05-04 MED ORDER — PRAVASTATIN SODIUM 40 MG PO TABS: 40.0000 mg | ORAL_TABLET | Freq: Every evening | ORAL | 0 refills | Status: DC

## 2020-05-04 NOTE — Assessment & Plan Note (Signed)
CKD Stage III with chronic metabolic acidosis. We did not discuss this but per chart review appears he re-established care with [facility] in November of 2021.   - consider addition of bicarb at follow-up - follow-up with [facility]

## 2020-05-04 NOTE — Assessment & Plan Note (Signed)
Due for repeat colonoscopy in 2020. Discussed with patient to call Dr. Scarlette Shorts with GI to schedule follow-up and provided number.

## 2020-05-04 NOTE — Patient Instructions (Addendum)
Thank you for allowing [place] to provide your care today. Today we discussed your low back pain   For your back pain, continue to use your back brace as needed.   Alternate ice and heat to the area.  Do not lift anything heavy for the next two weeks.  Use tylenol as needed for pain.   Please schedule a follow-up appointment with gastroenterology:  Address: [address], [place], [place] [address] Phone: [phone]  Please call the clinic in two weeks if symptoms fail to improve and we will order an x-ray.    If you develop incontinence of bowels or urine, numbness in the lower extremity or increased weakness, please call the clinic.   Please call the internal medicine center clinic if you have any questions or concerns, we may be able to help and keep you from a long and expensive emergency room wait. Our clinic and after hours phone number is [phone], the best time to call is Monday through Friday 9 am to 4 pm but there is always someone available 24/7 if you have an emergency. If you need medication refills please notify your pharmacy one week in advance and they will send [place] a request.

## 2020-05-04 NOTE — Telephone Encounter (Signed)
Received TC from patient who just left his appt.  He would like his pharmacy changed to [facility] [address]. Changed in Epic. SChaplin, RN,BSN

## 2020-05-04 NOTE — Progress Notes (Signed)
   CC: back pain  HPI:  [name] is a 70 y.o. with PMH as below.   Please see A&P for assessment of the patient's acute and chronic medical conditions.   He was getting up from the toilet four days ago and felt a sudden twinge in his left lower back with pain that radiated to his upper thigh. The pain resolved with rest but returned yesterday. He put on his back brace he wore after surgery and pain again improved. He is s/p L4-L5 spinal fusion Feb 2020 with Dr. Earnie Larsson. At its worse the pain was 8/10, currently it is feeling better with the back brace. He denies numbness, tingling, he does feel the left is slightly weaker, no incontinence.   Past Medical History:  Diagnosis Date  . Chronic renal insufficiency 03/21/2006   CrCl <50 ml  . DDD (degenerative disc disease), cervical   . GERD (gastroesophageal reflux disease) 03/22/2006  . Hand numbness 10/07/2009  . Hyperlipidemia 03/22/2008  . Hypertension   . Hypertension 03/22/2006  . Left ventricular hypertrophy 03/22/2006  . Leg pain, left 11/15/2009  . Lumbar strain 07/15/2008  . Renal insufficiency   . Soft tissue disorder 08/2008  . Tobacco abuse 03/22/2006   2 ppd x 30 years   Review of Systems:   10 point ROS negative except as noted in HPI  Physical Exam:   Constitution: NAD, appears stated age HENT: [place]/AT   Cardio: regular rate, no LE edema  Respiratory: non-labored breathing, on room air MSK: No focal back tenderness on exam, negative straight leg test, normal gait, strength 5/5 bilateral lower extremites, reflexes 2+ bilaterally  Neuro: normal affect, a&ox3 Skin: c/d/i    Vitals:   05/04/20 1428  BP: (!) 141/68  Pulse: 63  Temp: 98.4 F (36.9 C)  TempSrc: Oral  SpO2: 99%  Weight: 149 lb 3.2 oz (67.7 kg)    Assessment & Plan:   See Encounters Tab for problem based charting.  Patient discussed with Dr. Heber [place].

## 2020-05-04 NOTE — Assessment & Plan Note (Addendum)
He was getting up from the toilet four days ago and felt a sudden twinge in his left lower back with pain that radiated to his upper thigh. The pain resolved with rest but returned yesterday. He put on his back brace he wore after surgery and pain again improved. He is s/p L4-L5 spinal fusion Feb 2020 with Dr. Earnie Larsson. At its worse the pain was 8/10, currently it is feeling better with the back brace. He denies numbness, tingling, he does feel the left is slightly weaker, no incontinence.  On PE strength is symmetrical, no tenderness over back or upper buttocks although can isolate area of pain, reflexes intact, normal straight leg test. No red flags for stenosis, doubt acute fracture without TTP, possible disc herniation with endorsing some radiation down thigh but straight leg negative vs. Muscle strain.   - continue back brace, alternate ice/heat, tylenol prn  - f/u in two weeks if symptoms do not improve for imaging and referral back to Dr. Annette Stable.  - f/u earlier if development of numbness, incontinence, increased weakness.

## 2020-05-06 NOTE — Progress Notes (Signed)
Internal Medicine Clinic Attending  Case discussed with Dr. Seawell  At the time of the visit.  We reviewed the resident's history and exam and pertinent patient test results.  I agree with the assessment, diagnosis, and plan of care documented in the resident's note.  

## 2020-05-09 ENCOUNTER — Other Ambulatory Visit: Payer: Self-pay

## 2020-05-09 ENCOUNTER — Ambulatory Visit (HOSPITAL_COMMUNITY)
Admission: RE | Admit: 2020-05-09 | Discharge: 2020-05-09 | Disposition: A | Discharge status: Home / Self Care | Payer: Medicare Other | Source: Ambulatory Visit | Attending: Vascular Surgery | Admitting: Vascular Surgery

## 2020-05-09 ENCOUNTER — Telehealth: Payer: Self-pay

## 2020-05-09 DIAGNOSIS — I739 Peripheral vascular disease, unspecified: Secondary | ICD-10-CM

## 2020-05-09 NOTE — Progress Notes (Signed)
[facility] [place]  ASSESSMENT / PLAN: 70 y.o. male with William Perkins is a 70 y.o. male with atherosclerosis of native arteries of right lower extremity causing ischemic rest pain.  Patient counseled patients with chronic limb threatening ischemia have an annual risk of cardiovascular mortality of 25% and a annual amputation risk of 25%. Aggressive risk factor modification and intervention for limb salvage is indicated..  Recommend the following which can slow the progression of atherosclerosis and reduce the risk of major adverse cardiac / limb events:  Complete cessation from all tobacco products. Blood glucose control with goal A1c < 7%. Blood pressure control with goal blood pressure < 140/90 mmHg. Lipid reduction therapy with goal LDL-C <100 mg/dL (<70 if symptomatic from PAD).  Aspirin 81mg  PO QD.  Atorvastatin 40-80mg  PO QD (or other "high intensity" statin therapy).  Plan right lower extremity angiogram with possible intervention via left common femoral approach in cath lab 05/11/20. With his renal insufficiency, we will use CO2 mostly and very small contrast volume if intervention is planned. I will also do an aortogram to evaluate the renal arteries given his severe hypertension and renal insufficiency.  CHIEF COMPLAINT: follow up for claudication  HISTORY OF PRESENT ILLNESS: William Perkins is a 70 y.o. male last seen in late October for evaluation of intermittent claudication. At that time he was abstaining from tobacco, and adherent to best medical therapy for peripheral arterial disease. I counseled him to maintain an exercise log and monitor his progress. I explained the rationale for avoiding intervention, if at all possible. He returns to the office today.  He reports his right foot has started to bother him more.  He continues have cramping when he walks.  The right foot is painful, often at night.  Tylenol has had less and less effect to relieve his  pain.  He tried icy hot on his foot and this worsened the pain significantly.  VASCULAR RISK FACTORS: Patient reports: - no history of cerebrovascular disease / stroke / transient ischemic attack. - no history of coronary artery disease. no history of PCI. no history of CABG.  - no history of diabetes mellitus (A1c 5.8). - + history of smoking. 44 pack / years. Not actively smoking. - + history of hypertension. 5 drug regimen with modest control. - + history of chronic kidney disease (GFR 43) - no history of chronic obstructive pulmonary disease.  Past Medical History:  Diagnosis Date  . Chronic renal insufficiency 03/21/2006   CrCl <50 ml  . DDD (degenerative disc disease), cervical   . GERD (gastroesophageal reflux disease) 03/22/2006  . Hand numbness 10/07/2009  . Hyperlipidemia 03/22/2008  . Hypertension   . Hypertension 03/22/2006  . Left ventricular hypertrophy 03/22/2006  . Leg pain, left 11/15/2009  . Lumbar strain 07/15/2008  . Renal insufficiency   . Soft tissue disorder 08/2008  . Tobacco abuse 03/22/2006   2 ppd x 30 years    Past Surgical History:  Procedure Laterality Date  . COLONOSCOPY    . WISDOM TOOTH EXTRACTION      Family History  Problem Relation Age of Onset  . Diabetes Mother   . Diabetes Sister   . Diabetes Brother   . Heart disease Brother   . Rectal cancer Neg Hx   . Stomach cancer Neg Hx   . Colon cancer Neg Hx     Social History   Socioeconomic History  . Marital status: Married    Spouse name:  Not on file  . Number of children: Not on file  . Years of education: 46  . Highest education level: Not on file  Occupational History  . Not on file  Tobacco Use  . Smoking status: Former Smoker    Packs/day: 1.00    Years: 44.00    Pack years: 44.00    Types: Cigarettes    Quit date: 11/21/2013    Years since quitting: 6.4  . Smokeless tobacco: Never Used  . Tobacco comment: Stopped 11/21/2013   Vaping Use  . Vaping Use: Never  used  Substance and Sexual Activity  . Alcohol use: No    Alcohol/week: 0.0 standard drinks    Comment: Quit 2015  . Drug use: No  . Sexual activity: Yes    Partners: Female  Other Topics Concern  . Not on file  Social History Narrative  . Not on file   Social Determinants of Health   Financial Resource Strain: Not on file  Food Insecurity: Not on file  Transportation Needs: Not on file  Physical Activity: Not on file  Stress: Not on file  Social Connections: Not on file  Intimate Partner Violence: Not on file    No Known Allergies  Current Outpatient Medications  Medication Sig Dispense Refill  . amLODipine (NORVASC) 10 MG tablet Take 1 tablet (10 mg total) by mouth daily. 30 tablet 11  . aspirin EC 81 MG tablet Take 1 tablet (81 mg total) by mouth daily. Swallow whole. 90 tablet 0  . cloNIDine (CATAPRES) 0.2 MG tablet Take 1 tablet (0.2 mg total) by mouth 2 (two) times daily. 60 tablet 11  . hydrALAZINE (APRESOLINE) 100 MG tablet Take 1 tablet (100 mg total) by mouth 2 (two) times daily. 90 tablet 3  . lisinopril (ZESTRIL) 40 MG tablet Take 1 tablet (40 mg total) by mouth daily. 30 tablet 11  . polyethylene glycol (MIRALAX / GLYCOLAX) 17 g packet Take 17 g by mouth 2 (two) times daily. 14 each 25  . pravastatin (PRAVACHOL) 40 MG tablet Take 1 tablet (40 mg total) by mouth every evening. 90 tablet 0  . psyllium (METAMUCIL) 58.6 % packet Take 1 packet by mouth daily.    Marland Kitchen spironolactone (ALDACTONE) 25 MG tablet Take 1 tablet (25 mg total) by mouth daily. 30 tablet 2   No current facility-administered medications for this visit.    REVIEW OF SYSTEMS:  [X]  denotes positive finding, [ ]  denotes negative finding Cardiac  Comments:  Chest pain or chest pressure:    Shortness of breath upon exertion:    Short of breath when lying flat:    Irregular heart rhythm:        Vascular    Pain in calf, thigh, or hip brought on by ambulation: x   Pain in feet at night that wakes  you up from your sleep:  x   Blood clot in your veins:    Leg swelling:         Pulmonary    Oxygen at home:    Productive cough:     Wheezing:         Neurologic    Sudden weakness in arms or legs:     Sudden numbness in arms or legs:     Sudden onset of difficulty speaking or slurred speech:    Temporary loss of vision in one eye:     Problems with dizziness:         Gastrointestinal  Blood in stool:     Vomited blood:         Genitourinary    Burning when urinating:     Blood in urine:        Psychiatric    Major depression:         Hematologic    Bleeding problems:    Problems with blood clotting too easily:        Skin    Rashes or ulcers:        Constitutional    Fever or chills:      PHYSICAL EXAM  Vitals:   05/10/20 0816  BP: 95/60  Pulse: 74  Resp: 20  Temp: 98.1 F (36.7 C)  SpO2: 100%  Weight: 148 lb (67.1 kg)  Height: 5\' 11"  (1.803 m)    Constitutional: well appearing. no distress. Appears well nourished.  Neurologic: CN intact. no focal findings. no sensory loss. Psychiatric: Mood and affect symmetric and appropriate. Eyes: No icterus. No conjunctival pallor. Ears, nose, throat: mucous membranes moist. Midline trachea.  Cardiac: regular rate and rhythm.  Respiratory: unlabored. Abdominal: soft, non-tender, non-distended.  Peripheral vascular:  2+ femoral pulses  Monophasic doppler flow in PT / AT  Erythema about the first toe and dorsum of medial foot  Very tender right great toe Extremity: No edema. No cyanosis. No pallor.  Skin: No gangrene. No ulceration.  Lymphatic: No Stemmer's sign. No palpable lymphadenopathy.  PERTINENT LABORATORY AND RADIOLOGIC DATA  Most recent CBC CBC Latest Ref Rng & Units 10/16/2018 04/07/2018 12/13/2017  WBC 3.4 - 10.8 x10E3/uL 7.0 8.5 7.4  Hemoglobin 13.0 - 17.7 g/dL 11.7(L) 11.8(L) 11.8(L)  Hematocrit 37.5 - 51.0 % 37.0(L) 35.7(L) 36.2(L)  Platelets 150 - 450 x10E3/uL 240 289 342     Most  recent CMP CMP Latest Ref Rng & Units 04/07/2020 12/25/2019 09/03/2019  Glucose 65 - 99 mg/dL 186(H) 80 117(H)  BUN 8 - 27 mg/dL 31(H) 35(H) 27  Creatinine 0.76 - 1.27 mg/dL 1.80(H) 2.05(H) 2.08(H)  Sodium 134 - 144 mmol/L 137 138 140  Potassium 3.5 - 5.2 mmol/L 4.6 4.4 4.6  Chloride 96 - 106 mmol/L 102 103 105  CO2 20 - 29 mmol/L 18(L) 19(L) 20  Calcium 8.6 - 10.2 mg/dL 9.3 10.1 10.0  Total Protein 6.0 - 8.5 g/dL - - -  Total Bilirubin 0.0 - 1.2 mg/dL - - -  Alkaline Phos 39 - 117 IU/L - - -  AST 0 - 40 IU/L - - -  ALT 0 - 44 IU/L - - -    Renal function CrCl cannot be calculated (Patient's most recent lab result is older than the maximum 21 days allowed.).  Hemoglobin A1C (%)  Date Value  12/25/2019 5.5    LDL Calculated  Date Value Ref Range Status  10/16/2018 128 (H) 0 - 99 mg/dL Final     Vascular Imaging: ABI 05/09/20 R 0.0 L 1.4, TP 61  Previous ABI showed non-compressible waveforms on the R with a TBI of 0.09  Pollyanna Levay N. Stanford Breed, MD [facility] Phone Number: [phone] 05/09/2020 3:12 PM

## 2020-05-09 NOTE — H&P (View-Only) (Signed)
[facility] [place]  ASSESSMENT / PLAN: 70 y.o. male with ALEM FAHL is a 70 y.o. male with atherosclerosis of native arteries of right lower extremity causing ischemic rest pain.  Patient counseled patients with chronic limb threatening ischemia have an annual risk of cardiovascular mortality of 25% and a annual amputation risk of 25%. Aggressive risk factor modification and intervention for limb salvage is indicated..  Recommend the following which can slow the progression of atherosclerosis and reduce the risk of major adverse cardiac / limb events:  Complete cessation from all tobacco products. Blood glucose control with goal A1c < 7%. Blood pressure control with goal blood pressure < 140/90 mmHg. Lipid reduction therapy with goal LDL-C <100 mg/dL (<70 if symptomatic from PAD).  Aspirin 81mg  PO QD.  Atorvastatin 40-80mg  PO QD (or other "high intensity" statin therapy).  Plan right lower extremity angiogram with possible intervention via left common femoral approach in cath lab 05/11/20. With his renal insufficiency, we will use CO2 mostly and very small contrast volume if intervention is planned. I will also do an aortogram to evaluate the renal arteries given his severe hypertension and renal insufficiency.  CHIEF COMPLAINT: follow up for claudication  HISTORY OF PRESENT ILLNESS: DARRIL PATRIARCA is a 70 y.o. male last seen in late October for evaluation of intermittent claudication. At that time he was abstaining from tobacco, and adherent to best medical therapy for peripheral arterial disease. I counseled him to maintain an exercise log and monitor his progress. I explained the rationale for avoiding intervention, if at all possible. He returns to the office today.  He reports his right foot has started to bother him more.  He continues have cramping when he walks.  The right foot is painful, often at night.  Tylenol has had less and less effect to relieve his  pain.  He tried icy hot on his foot and this worsened the pain significantly.  VASCULAR RISK FACTORS: Patient reports: - no history of cerebrovascular disease / stroke / transient ischemic attack. - no history of coronary artery disease. no history of PCI. no history of CABG.  - no history of diabetes mellitus (A1c 5.8). - + history of smoking. 44 pack / years. Not actively smoking. - + history of hypertension. 5 drug regimen with modest control. - + history of chronic kidney disease (GFR 43) - no history of chronic obstructive pulmonary disease.  Past Medical History:  Diagnosis Date  . Chronic renal insufficiency 03/21/2006   CrCl <50 ml  . DDD (degenerative disc disease), cervical   . GERD (gastroesophageal reflux disease) 03/22/2006  . Hand numbness 10/07/2009  . Hyperlipidemia 03/22/2008  . Hypertension   . Hypertension 03/22/2006  . Left ventricular hypertrophy 03/22/2006  . Leg pain, left 11/15/2009  . Lumbar strain 07/15/2008  . Renal insufficiency   . Soft tissue disorder 08/2008  . Tobacco abuse 03/22/2006   2 ppd x 30 years    Past Surgical History:  Procedure Laterality Date  . COLONOSCOPY    . WISDOM TOOTH EXTRACTION      Family History  Problem Relation Age of Onset  . Diabetes Mother   . Diabetes Sister   . Diabetes Brother   . Heart disease Brother   . Rectal cancer Neg Hx   . Stomach cancer Neg Hx   . Colon cancer Neg Hx     Social History   Socioeconomic History  . Marital status: Married    Spouse name:  Not on file  . Number of children: Not on file  . Years of education: 60  . Highest education level: Not on file  Occupational History  . Not on file  Tobacco Use  . Smoking status: Former Smoker    Packs/day: 1.00    Years: 44.00    Pack years: 44.00    Types: Cigarettes    Quit date: 11/21/2013    Years since quitting: 6.4  . Smokeless tobacco: Never Used  . Tobacco comment: Stopped 11/21/2013   Vaping Use  . Vaping Use: Never  used  Substance and Sexual Activity  . Alcohol use: No    Alcohol/week: 0.0 standard drinks    Comment: Quit 2015  . Drug use: No  . Sexual activity: Yes    Partners: Female  Other Topics Concern  . Not on file  Social History Narrative  . Not on file   Social Determinants of Health   Financial Resource Strain: Not on file  Food Insecurity: Not on file  Transportation Needs: Not on file  Physical Activity: Not on file  Stress: Not on file  Social Connections: Not on file  Intimate Partner Violence: Not on file    No Known Allergies  Current Outpatient Medications  Medication Sig Dispense Refill  . amLODipine (NORVASC) 10 MG tablet Take 1 tablet (10 mg total) by mouth daily. 30 tablet 11  . aspirin EC 81 MG tablet Take 1 tablet (81 mg total) by mouth daily. Swallow whole. 90 tablet 0  . cloNIDine (CATAPRES) 0.2 MG tablet Take 1 tablet (0.2 mg total) by mouth 2 (two) times daily. 60 tablet 11  . hydrALAZINE (APRESOLINE) 100 MG tablet Take 1 tablet (100 mg total) by mouth 2 (two) times daily. 90 tablet 3  . lisinopril (ZESTRIL) 40 MG tablet Take 1 tablet (40 mg total) by mouth daily. 30 tablet 11  . polyethylene glycol (MIRALAX / GLYCOLAX) 17 g packet Take 17 g by mouth 2 (two) times daily. 14 each 25  . pravastatin (PRAVACHOL) 40 MG tablet Take 1 tablet (40 mg total) by mouth every evening. 90 tablet 0  . psyllium (METAMUCIL) 58.6 % packet Take 1 packet by mouth daily.    Marland Kitchen spironolactone (ALDACTONE) 25 MG tablet Take 1 tablet (25 mg total) by mouth daily. 30 tablet 2   No current facility-administered medications for this visit.    REVIEW OF SYSTEMS:  [X]  denotes positive finding, [ ]  denotes negative finding Cardiac  Comments:  Chest pain or chest pressure:    Shortness of breath upon exertion:    Short of breath when lying flat:    Irregular heart rhythm:        Vascular    Pain in calf, thigh, or hip brought on by ambulation: x   Pain in feet at night that wakes  you up from your sleep:  x   Blood clot in your veins:    Leg swelling:         Pulmonary    Oxygen at home:    Productive cough:     Wheezing:         Neurologic    Sudden weakness in arms or legs:     Sudden numbness in arms or legs:     Sudden onset of difficulty speaking or slurred speech:    Temporary loss of vision in one eye:     Problems with dizziness:         Gastrointestinal  Blood in stool:     Vomited blood:         Genitourinary    Burning when urinating:     Blood in urine:        Psychiatric    Major depression:         Hematologic    Bleeding problems:    Problems with blood clotting too easily:        Skin    Rashes or ulcers:        Constitutional    Fever or chills:      PHYSICAL EXAM  Vitals:   05/10/20 0816  BP: 95/60  Pulse: 74  Resp: 20  Temp: 98.1 F (36.7 C)  SpO2: 100%  Weight: 148 lb (67.1 kg)  Height: 5\' 11"  (1.803 m)    Constitutional: well appearing. no distress. Appears well nourished.  Neurologic: CN intact. no focal findings. no sensory loss. Psychiatric: Mood and affect symmetric and appropriate. Eyes: No icterus. No conjunctival pallor. Ears, nose, throat: mucous membranes moist. Midline trachea.  Cardiac: regular rate and rhythm.  Respiratory: unlabored. Abdominal: soft, non-tender, non-distended.  Peripheral vascular:  2+ femoral pulses  Monophasic doppler flow in PT / AT  Erythema about the first toe and dorsum of medial foot  Very tender right great toe Extremity: No edema. No cyanosis. No pallor.  Skin: No gangrene. No ulceration.  Lymphatic: No Stemmer's sign. No palpable lymphadenopathy.  PERTINENT LABORATORY AND RADIOLOGIC DATA  Most recent CBC CBC Latest Ref Rng & Units 10/16/2018 04/07/2018 12/13/2017  WBC 3.4 - 10.8 x10E3/uL 7.0 8.5 7.4  Hemoglobin 13.0 - 17.7 g/dL 11.7(L) 11.8(L) 11.8(L)  Hematocrit 37.5 - 51.0 % 37.0(L) 35.7(L) 36.2(L)  Platelets 150 - 450 x10E3/uL 240 289 342     Most  recent CMP CMP Latest Ref Rng & Units 04/07/2020 12/25/2019 09/03/2019  Glucose 65 - 99 mg/dL 186(H) 80 117(H)  BUN 8 - 27 mg/dL 31(H) 35(H) 27  Creatinine 0.76 - 1.27 mg/dL 1.80(H) 2.05(H) 2.08(H)  Sodium 134 - 144 mmol/L 137 138 140  Potassium 3.5 - 5.2 mmol/L 4.6 4.4 4.6  Chloride 96 - 106 mmol/L 102 103 105  CO2 20 - 29 mmol/L 18(L) 19(L) 20  Calcium 8.6 - 10.2 mg/dL 9.3 10.1 10.0  Total Protein 6.0 - 8.5 g/dL - - -  Total Bilirubin 0.0 - 1.2 mg/dL - - -  Alkaline Phos 39 - 117 IU/L - - -  AST 0 - 40 IU/L - - -  ALT 0 - 44 IU/L - - -    Renal function CrCl cannot be calculated (Patient's most recent lab result is older than the maximum 21 days allowed.).  Hemoglobin A1C (%)  Date Value  12/25/2019 5.5    LDL Calculated  Date Value Ref Range Status  10/16/2018 128 (H) 0 - 99 mg/dL Final     Vascular Imaging: ABI 05/09/20 R 0.0 L 1.4, TP 61  Previous ABI showed non-compressible waveforms on the R with a TBI of 0.09  Keanu Frickey N. Stanford Breed, MD [facility] Phone Number: [phone] 05/09/2020 3:12 PM

## 2020-05-09 NOTE — H&P (View-Only) (Signed)
[facility]  ASSESSMENT / PLAN: 70 y.o. male with JAKWON GAYTON is a 70 y.o. male with atherosclerosis of native arteries of right lower extremity causing ischemic rest pain.  Patient counseled patients with chronic limb threatening ischemia have an annual risk of cardiovascular mortality of 25% and a annual amputation risk of 25%. Aggressive risk factor modification and intervention for limb salvage is indicated..  Recommend the following which can slow the progression of atherosclerosis and reduce the risk of major adverse cardiac / limb events:  Complete cessation from all tobacco products. Blood glucose control with goal A1c < 7%. Blood pressure control with goal blood pressure < 140/90 mmHg. Lipid reduction therapy with goal LDL-C <100 mg/dL (<70 if symptomatic from PAD).  Aspirin 81mg  PO QD.  Atorvastatin 40-80mg  PO QD (or other "high intensity" statin therapy).  Plan right lower extremity angiogram with possible intervention via left common femoral approach in cath lab 05/11/20. With his renal insufficiency, we will use CO2 mostly and very small contrast volume if intervention is planned. I will also do an aortogram to evaluate the renal arteries given his severe hypertension and renal insufficiency.  CHIEF COMPLAINT: follow up for claudication  HISTORY OF PRESENT ILLNESS: William Perkins is a 70 y.o. male last seen in late October for evaluation of intermittent claudication. At that time he was abstaining from tobacco, and adherent to best medical therapy for peripheral arterial disease. I counseled him to maintain an exercise log and monitor his progress. I explained the rationale for avoiding intervention, if at all possible. He returns to the office today.  He reports his right foot has started to bother him more.  He continues have cramping when he walks.  The right foot is painful, often at night.  Tylenol has had less and less effect to relieve his  pain.  He tried icy hot on his foot and this worsened the pain significantly.  VASCULAR RISK FACTORS: Patient reports: - no history of cerebrovascular disease / stroke / transient ischemic attack. - no history of coronary artery disease. no history of PCI. no history of CABG.  - no history of diabetes mellitus (A1c 5.8). - + history of smoking. 44 pack / years. Not actively smoking. - + history of hypertension. 5 drug regimen with modest control. - + history of chronic kidney disease (GFR 43) - no history of chronic obstructive pulmonary disease.  Past Medical History:  Diagnosis Date  . Chronic renal insufficiency 03/21/2006   CrCl <50 ml  . DDD (degenerative disc disease), cervical   . GERD (gastroesophageal reflux disease) 03/22/2006  . Hand numbness 10/07/2009  . Hyperlipidemia 03/22/2008  . Hypertension   . Hypertension 03/22/2006  . Left ventricular hypertrophy 03/22/2006  . Leg pain, left 11/15/2009  . Lumbar strain 07/15/2008  . Renal insufficiency   . Soft tissue disorder 08/2008  . Tobacco abuse 03/22/2006   2 ppd x 30 years    Past Surgical History:  Procedure Laterality Date  . COLONOSCOPY    . WISDOM TOOTH EXTRACTION      Family History  Problem Relation Age of Onset  . Diabetes Mother   . Diabetes Sister   . Diabetes Brother   . Heart disease Brother   . Rectal cancer Neg Hx   . Stomach cancer Neg Hx   . Colon cancer Neg Hx     Social History   Socioeconomic History  . Marital status: Married    Spouse name:  Not on file  . Number of children: Not on file  . Years of education: 66  . Highest education level: Not on file  Occupational History  . Not on file  Tobacco Use  . Smoking status: Former Smoker    Packs/day: 1.00    Years: 44.00    Pack years: 44.00    Types: Cigarettes    Quit date: 11/21/2013    Years since quitting: 6.4  . Smokeless tobacco: Never Used  . Tobacco comment: Stopped 11/21/2013   Vaping Use  . Vaping Use: Never  used  Substance and Sexual Activity  . Alcohol use: No    Alcohol/week: 0.0 standard drinks    Comment: Quit 2015  . Drug use: No  . Sexual activity: Yes    Partners: Female  Other Topics Concern  . Not on file  Social History Narrative  . Not on file   Social Determinants of Health   Financial Resource Strain: Not on file  Food Insecurity: Not on file  Transportation Needs: Not on file  Physical Activity: Not on file  Stress: Not on file  Social Connections: Not on file  Intimate Partner Violence: Not on file    No Known Allergies  Current Outpatient Medications  Medication Sig Dispense Refill  . amLODipine (NORVASC) 10 MG tablet Take 1 tablet (10 mg total) by mouth daily. 30 tablet 11  . aspirin EC 81 MG tablet Take 1 tablet (81 mg total) by mouth daily. Swallow whole. 90 tablet 0  . cloNIDine (CATAPRES) 0.2 MG tablet Take 1 tablet (0.2 mg total) by mouth 2 (two) times daily. 60 tablet 11  . hydrALAZINE (APRESOLINE) 100 MG tablet Take 1 tablet (100 mg total) by mouth 2 (two) times daily. 90 tablet 3  . lisinopril (ZESTRIL) 40 MG tablet Take 1 tablet (40 mg total) by mouth daily. 30 tablet 11  . polyethylene glycol (MIRALAX / GLYCOLAX) 17 g packet Take 17 g by mouth 2 (two) times daily. 14 each 25  . pravastatin (PRAVACHOL) 40 MG tablet Take 1 tablet (40 mg total) by mouth every evening. 90 tablet 0  . psyllium (METAMUCIL) 58.6 % packet Take 1 packet by mouth daily.    Marland Kitchen spironolactone (ALDACTONE) 25 MG tablet Take 1 tablet (25 mg total) by mouth daily. 30 tablet 2   No current facility-administered medications for this visit.    REVIEW OF SYSTEMS:  [X]  denotes positive finding, [ ]  denotes negative finding Cardiac  Comments:  Chest pain or chest pressure:    Shortness of breath upon exertion:    Short of breath when lying flat:    Irregular heart rhythm:        Vascular    Pain in calf, thigh, or hip brought on by ambulation: x   Pain in feet at night that wakes  you up from your sleep:  x   Blood clot in your veins:    Leg swelling:         Pulmonary    Oxygen at home:    Productive cough:     Wheezing:         Neurologic    Sudden weakness in arms or legs:     Sudden numbness in arms or legs:     Sudden onset of difficulty speaking or slurred speech:    Temporary loss of vision in one eye:     Problems with dizziness:         Gastrointestinal  Blood in stool:     Vomited blood:         Genitourinary    Burning when urinating:     Blood in urine:        Psychiatric    Major depression:         Hematologic    Bleeding problems:    Problems with blood clotting too easily:        Skin    Rashes or ulcers:        Constitutional    Fever or chills:      PHYSICAL EXAM  Vitals:   05/10/20 0816  BP: 95/60  Pulse: 74  Resp: 20  Temp: 98.1 F (36.7 C)  SpO2: 100%  Weight: 148 lb (67.1 kg)  Height: 5\' 11"  (1.803 m)    Constitutional: well appearing. no distress. Appears well nourished.  Neurologic: CN intact. no focal findings. no sensory loss. Psychiatric: Mood and affect symmetric and appropriate. Eyes: No icterus. No conjunctival pallor. Ears, nose, throat: mucous membranes moist. Midline trachea.  Cardiac: regular rate and rhythm.  Respiratory: unlabored. Abdominal: soft, non-tender, non-distended.  Peripheral vascular:  2+ femoral pulses  Monophasic doppler flow in PT / AT  Erythema about the first toe and dorsum of medial foot  Very tender right great toe Extremity: No edema. No cyanosis. No pallor.  Skin: No gangrene. No ulceration.  Lymphatic: No Stemmer's sign. No palpable lymphadenopathy.  PERTINENT LABORATORY AND RADIOLOGIC DATA  Most recent CBC CBC Latest Ref Rng & Units 10/16/2018 04/07/2018 12/13/2017  WBC 3.4 - 10.8 x10E3/uL 7.0 8.5 7.4  Hemoglobin 13.0 - 17.7 g/dL 11.7(L) 11.8(L) 11.8(L)  Hematocrit 37.5 - 51.0 % 37.0(L) 35.7(L) 36.2(L)  Platelets 150 - 450 x10E3/uL 240 289 342     Most  recent CMP CMP Latest Ref Rng & Units 04/07/2020 12/25/2019 09/03/2019  Glucose 65 - 99 mg/dL 186(H) 80 117(H)  BUN 8 - 27 mg/dL 31(H) 35(H) 27  Creatinine 0.76 - 1.27 mg/dL 1.80(H) 2.05(H) 2.08(H)  Sodium 134 - 144 mmol/L 137 138 140  Potassium 3.5 - 5.2 mmol/L 4.6 4.4 4.6  Chloride 96 - 106 mmol/L 102 103 105  CO2 20 - 29 mmol/L 18(L) 19(L) 20  Calcium 8.6 - 10.2 mg/dL 9.3 10.1 10.0  Total Protein 6.0 - 8.5 g/dL - - -  Total Bilirubin 0.0 - 1.2 mg/dL - - -  Alkaline Phos 39 - 117 IU/L - - -  AST 0 - 40 IU/L - - -  ALT 0 - 44 IU/L - - -    Renal function CrCl cannot be calculated (Patient's most recent lab result is older than the maximum 21 days allowed.).  Hemoglobin A1C (%)  Date Value  12/25/2019 5.5    LDL Calculated  Date Value Ref Range Status  10/16/2018 128 (H) 0 - 99 mg/dL Final     Vascular Imaging: ABI 05/09/20 R 0.0 L 1.4, TP 61  Previous ABI showed non-compressible waveforms on the R with a TBI of 0.09  Terriana Barreras N. Stanford Breed, MD [facility] Phone Number: [phone] 05/09/2020 3:12 PM

## 2020-05-09 NOTE — Telephone Encounter (Signed)
Patient is having numbness and tingling in his right foot over the last week. "Is very, very bad - it keeps me up all night" His foot is also swollen. Would like to be seen ASAP. Offered same day ABI and follow up next week - would really like to be seen today. Placed him on for ABI today and evaluation by [facility] tomorrow. Patient verbalizes understanding.

## 2020-05-10 ENCOUNTER — Encounter: Payer: Self-pay | Admitting: Vascular Surgery

## 2020-05-10 ENCOUNTER — Ambulatory Visit: Payer: Medicare Other | Admitting: Vascular Surgery

## 2020-05-10 ENCOUNTER — Other Ambulatory Visit: Payer: Self-pay

## 2020-05-10 VITALS — BP 95/60 | HR 74 | Temp 98.1°F | Resp 20 | Ht 71.0 in | Wt 148.0 lb

## 2020-05-10 DIAGNOSIS — I70221 Atherosclerosis of native arteries of extremities with rest pain, right leg: Secondary | ICD-10-CM

## 2020-05-17 ENCOUNTER — Other Ambulatory Visit (HOSPITAL_COMMUNITY)
Admission: RE | Admit: 2020-05-17 | Discharge: 2020-05-17 | Disposition: A | Discharge status: Home / Self Care | Payer: Medicare Other | Source: Ambulatory Visit | Attending: Vascular Surgery | Admitting: Vascular Surgery

## 2020-05-17 DIAGNOSIS — Z01812 Encounter for preprocedural laboratory examination: Secondary | ICD-10-CM | POA: Insufficient documentation

## 2020-05-17 DIAGNOSIS — Z20822 Contact with and (suspected) exposure to covid-19: Secondary | ICD-10-CM | POA: Insufficient documentation

## 2020-05-17 LAB — SARS CORONAVIRUS 2 (TAT 6-24 HRS): SARS Coronavirus 2: NEGATIVE

## 2020-05-18 ENCOUNTER — Other Ambulatory Visit: Payer: Self-pay

## 2020-05-18 ENCOUNTER — Ambulatory Visit (HOSPITAL_COMMUNITY)
Admission: RE | Disposition: A | Discharge status: Home / Self Care | Payer: Self-pay | Source: Home / Self Care | Attending: Vascular Surgery

## 2020-05-18 ENCOUNTER — Encounter (HOSPITAL_COMMUNITY): Payer: Self-pay | Admitting: Vascular Surgery

## 2020-05-18 ENCOUNTER — Other Ambulatory Visit: Payer: Self-pay | Admitting: Vascular Surgery

## 2020-05-18 ENCOUNTER — Ambulatory Visit (HOSPITAL_COMMUNITY)
Admission: RE | Admit: 2020-05-18 | Discharge: 2020-05-18 | Disposition: A | Discharge status: Home / Self Care | Payer: Medicare Other | Attending: Vascular Surgery | Admitting: Vascular Surgery

## 2020-05-18 DIAGNOSIS — N189 Chronic kidney disease, unspecified: Secondary | ICD-10-CM | POA: Diagnosis not present

## 2020-05-18 DIAGNOSIS — I129 Hypertensive chronic kidney disease with stage 1 through stage 4 chronic kidney disease, or unspecified chronic kidney disease: Secondary | ICD-10-CM | POA: Diagnosis not present

## 2020-05-18 DIAGNOSIS — Z7982 Long term (current) use of aspirin: Secondary | ICD-10-CM | POA: Diagnosis not present

## 2020-05-18 DIAGNOSIS — Z79899 Other long term (current) drug therapy: Secondary | ICD-10-CM | POA: Insufficient documentation

## 2020-05-18 DIAGNOSIS — Z87891 Personal history of nicotine dependence: Secondary | ICD-10-CM | POA: Insufficient documentation

## 2020-05-18 DIAGNOSIS — I70221 Atherosclerosis of native arteries of extremities with rest pain, right leg: Secondary | ICD-10-CM | POA: Diagnosis not present

## 2020-05-18 HISTORY — PX: ABDOMINAL AORTOGRAM W/LOWER EXTREMITY: CATH118223

## 2020-05-18 LAB — POCT I-STAT, CHEM 8
BUN: 35 mg/dL — ABNORMAL HIGH (ref 8–23)
Calcium, Ion: 1.29 mmol/L (ref 1.15–1.40)
Chloride: 104 mmol/L (ref 98–111)
Creatinine, Ser: 2.3 mg/dL — ABNORMAL HIGH (ref 0.61–1.24)
Glucose, Bld: 181 mg/dL — ABNORMAL HIGH (ref 70–99)
HCT: 32 % — ABNORMAL LOW (ref 39.0–52.0)
Hemoglobin: 10.9 g/dL — ABNORMAL LOW (ref 13.0–17.0)
Potassium: 4.6 mmol/L (ref 3.5–5.1)
Sodium: 135 mmol/L (ref 135–145)
TCO2: 23 mmol/L (ref 22–32)

## 2020-05-18 SURGERY — ABDOMINAL AORTOGRAM W/LOWER EXTREMITY
Anesthesia: LOCAL

## 2020-05-18 MED ORDER — ASPIRIN 81 MG PO CHEW
CHEWABLE_TABLET | ORAL | Status: AC
  Administered 2020-05-18: 81 mg
  Filled 2020-05-18: qty 1

## 2020-05-18 MED ORDER — SODIUM CHLORIDE 0.9 % WEIGHT BASED INFUSION: 1.0000 mL/kg/h | INTRAVENOUS | Status: DC

## 2020-05-18 MED ORDER — IODIXANOL 320 MG/ML IV SOLN
INTRAVENOUS | Status: DC | PRN
  Administered 2020-05-18: 16 mL

## 2020-05-18 MED ORDER — ONDANSETRON HCL 4 MG/2ML IJ SOLN: 4.0000 mg | Freq: Four times a day (QID) | INTRAMUSCULAR | Status: DC | PRN

## 2020-05-18 MED ORDER — MIDAZOLAM HCL 2 MG/2ML IJ SOLN
INTRAMUSCULAR | Status: DC | PRN
  Administered 2020-05-18: 1 mg via INTRAVENOUS

## 2020-05-18 MED ORDER — SODIUM CHLORIDE 0.9 % IV SOLN: INTRAVENOUS | Status: DC

## 2020-05-18 MED ORDER — FENTANYL CITRATE (PF) 100 MCG/2ML IJ SOLN
INTRAMUSCULAR | Status: DC | PRN
  Administered 2020-05-18: 25 ug via INTRAVENOUS

## 2020-05-18 MED ORDER — HYDRALAZINE HCL 20 MG/ML IJ SOLN: 5.0000 mg | INTRAMUSCULAR | Status: DC | PRN

## 2020-05-18 MED ORDER — SODIUM CHLORIDE 0.9 % IV SOLN: 250.0000 mL | INTRAVENOUS | Status: DC | PRN

## 2020-05-18 MED ORDER — SODIUM CHLORIDE 0.9% FLUSH: 3.0000 mL | Freq: Two times a day (BID) | INTRAVENOUS | Status: DC

## 2020-05-18 MED ORDER — OXYCODONE-ACETAMINOPHEN 5-325 MG PO TABS: 1.0000 | ORAL_TABLET | ORAL | 0 refills | Status: DC | PRN

## 2020-05-18 MED ORDER — HEPARIN (PORCINE) IN NACL 1000-0.9 UT/500ML-% IV SOLN
INTRAVENOUS | Status: DC | PRN
  Administered 2020-05-18 (×2): 500 mL

## 2020-05-18 MED ORDER — HEPARIN (PORCINE) IN NACL 1000-0.9 UT/500ML-% IV SOLN
INTRAVENOUS | Status: AC
  Filled 2020-05-18: qty 1000

## 2020-05-18 MED ORDER — ACETAMINOPHEN 325 MG PO TABS
ORAL_TABLET | ORAL | Status: AC
  Filled 2020-05-18: qty 2

## 2020-05-18 MED ORDER — LIDOCAINE HCL (PF) 1 % IJ SOLN
INTRAMUSCULAR | Status: DC | PRN
  Administered 2020-05-18: 15 mL via INTRADERMAL

## 2020-05-18 MED ORDER — LIDOCAINE HCL (PF) 1 % IJ SOLN
INTRAMUSCULAR | Status: AC
  Filled 2020-05-18: qty 30

## 2020-05-18 MED ORDER — ACETAMINOPHEN 325 MG PO TABS
650.0000 mg | ORAL_TABLET | ORAL | Status: DC | PRN
  Administered 2020-05-18: 650 mg via ORAL
  Filled 2020-05-18: qty 2

## 2020-05-18 MED ORDER — SODIUM CHLORIDE 0.9% FLUSH: 3.0000 mL | INTRAVENOUS | Status: DC | PRN

## 2020-05-18 MED ORDER — LABETALOL HCL 5 MG/ML IV SOLN: 10.0000 mg | INTRAVENOUS | Status: DC | PRN

## 2020-05-18 MED ORDER — FENTANYL CITRATE (PF) 100 MCG/2ML IJ SOLN
INTRAMUSCULAR | Status: AC
  Filled 2020-05-18: qty 2

## 2020-05-18 MED ORDER — ASPIRIN EC 81 MG PO TBEC: 81.0000 mg | DELAYED_RELEASE_TABLET | Freq: Every day | ORAL | Status: DC

## 2020-05-18 MED ORDER — MIDAZOLAM HCL 5 MG/5ML IJ SOLN
INTRAMUSCULAR | Status: AC
  Filled 2020-05-18: qty 5

## 2020-05-18 SURGICAL SUPPLY — 13 items
CATH OMNI FLUSH 5F 65CM (CATHETERS) ×1 IMPLANT
DEVICE CLOSURE MYNXGRIP 5F (Vascular Products) ×1 IMPLANT
FILTER CO2 0.2 MICRON (VASCULAR PRODUCTS) ×1 IMPLANT
KIT MICROPUNCTURE NIT STIFF (SHEATH) ×1 IMPLANT
KIT PV (KITS) ×2 IMPLANT
RESERVOIR CO2 (VASCULAR PRODUCTS) ×1 IMPLANT
SET FLUSH CO2 (MISCELLANEOUS) ×1 IMPLANT
SHEATH PINNACLE 5F 10CM (SHEATH) ×1 IMPLANT
SHEATH PROBE COVER 6X72 (BAG) ×1 IMPLANT
SYR MEDRAD MARK 7 150ML (SYRINGE) ×2 IMPLANT
TRANSDUCER W/STOPCOCK (MISCELLANEOUS) ×2 IMPLANT
TRAY PV CATH (CUSTOM PROCEDURE TRAY) ×2 IMPLANT
WIRE BENTSON .035X145CM (WIRE) ×1 IMPLANT

## 2020-05-18 NOTE — Discharge Instructions (Signed)
Femoral Site Care  This sheet gives you information about how to care for yourself after your procedure. Your health care provider may also give you more specific instructions. If you have problems or questions, contact your health care provider. What can I expect after the procedure? After the procedure, it is common to have:  Bruising that usually fades within 1-2 weeks.  Tenderness at the site. Follow these instructions at home: Wound care  Follow instructions from your health care provider about how to take care of your insertion site. Make sure you: ? Wash your hands with soap and water before you change your bandage (dressing). If soap and water are not available, use hand sanitizer. ? Change your dressing as told by your health care provider. ? Leave stitches (sutures), skin glue, or adhesive strips in place. These skin closures may need to stay in place for 2 weeks or longer. If adhesive strip edges start to loosen and curl up, you may trim the loose edges. Do not remove adhesive strips completely unless your health care provider tells you to do that.  Do not take baths, swim, or use a hot tub until your health care provider approves.  You may shower 24-48 hours after the procedure or as told by your health care provider. ? Gently wash the site with plain soap and water. ? Pat the area dry with a clean towel. ? Do not rub the site. This may cause bleeding.  Do not apply powder or lotion to the site. Keep the site clean and dry.  Check your femoral site every day for signs of infection. Check for: ? Redness, swelling, or pain. ? Fluid or blood. ? Warmth. ? Pus or a bad smell. Activity  For the first 2-3 days after your procedure, or as long as directed: ? Avoid climbing stairs as much as possible. ? Do not squat.  Do not lift anything that is heavier than 10 lb (4.5 kg), or the limit that you are told, until your health care provider says that it is safe.  Rest as  directed. ? Avoid sitting for a long time without moving. Get up to take short walks every 1-2 hours.  Do not drive for 24 hours if you were given a medicine to help you relax (sedative). General instructions  Take over-the-counter and prescription medicines only as told by your health care provider.  Keep all follow-up visits as told by your health care provider. This is important. Contact a health care provider if you have:  A fever or chills.  You have redness, swelling, or pain around your insertion site. Get help right away if:  The catheter insertion area swells very fast.  You pass out.  You suddenly start to sweat or your skin gets clammy.  The catheter insertion area is bleeding, and the bleeding does not stop when you hold steady pressure on the area.  The area near or just beyond the catheter insertion site becomes pale, cool, tingly, or numb. These symptoms may represent a serious problem that is an emergency. Do not wait to see if the symptoms will go away. Get medical help right away. Call your local emergency services (911 in the [place] Do not drive yourself to the hospital. Summary  After the procedure, it is common to have bruising that usually fades within 1-2 weeks.  Check your femoral site every day for signs of infection.  Do not lift anything that is heavier than 10 lb (4.5 kg), or   the limit that you are told, until your health care provider says that it is safe. This information is not intended to replace advice given to you by your health care provider. Make sure you discuss any questions you have with your health care provider. Document Revised: 10/23/2019 Document Reviewed: 10/23/2019 Elsevier Patient Education  2021 [employer]  

## 2020-05-18 NOTE — Op Note (Signed)
DATE OF SERVICE: 05/18/2020  PATIENT:  William Perkins  70 y.o. male  PRE-OPERATIVE DIAGNOSIS:  Atherosclerosis of native arteries of right lower extremity causing ischemic rest pain, renal insufficiency  POST-OPERATIVE DIAGNOSIS:  Same  PROCEDURE:   1) US guided left common femoral artery access access 2) Aortogram 3) Right lower extremity angiogram with second order cannulation (49mL total contrast) 4) Conscious sedation (26 minutes)  SURGEON:  Surgeon(s) and Role:    * Cherre Robins, MD - Primary  ASSISTANT: none  ANESTHESIA:   local and IV sedation  EBL: min  BLOOD ADMINISTERED:none  DRAINS: none   LOCAL MEDICATIONS USED:  LIDOCAINE   SPECIMEN:  none  COUNTS: confirmed correct.  TOURNIQUET:  None  PATIENT DISPOSITION:  PACU - hemodynamically stable.   Delay start of Pharmacological VTE agent (>24hrs) due to surgical blood loss or risk of bleeding: no  INDICATION FOR PROCEDURE: William Perkins is a 70 y.o. male with ischemic rest pain of the right leg by clinical exam and non-invasive exam. He also has renal insufficiency and difficult to control hypertension. After careful discussion of risks, benefits, and alternatives the patient was offered aortogram, possible renal angiogram and right lower extremity angiography with CO2 contrast. We specifically discussed likely need for contrast and possibility of contrast nephropathy. We also discussed access site complications. The patient understood and wished to proceed.  OPERATIVE FINDINGS:  Non-diagnostic aortogram from renal stenosis standpoint. Further evaluation of possible renal artery stenosis would need iodinated contrast Right lower extremity angiography minimally diagnostic with CO2 contrast 4mL total contrast injected in RLE Proximal right profunda and superficial femoral artery occlusion - likely culprit lesions for ischemic rest pain. SFA and popliteal disease without obvious flow limiting stenosis Peroneal  only runoff to the foot.  DESCRIPTION OF PROCEDURE: After identification of the patient in the pre-operative holding area, the patient was transferred to the operating room. The patient was positioned supine on the operating room table. Anesthesia was induced. The groins was prepped and draped in standard fashion. A surgical pause was performed confirming correct patient, procedure, and operative location.  The left groin was anesthetized with subcutaneous injection of 1% lidocaine. Using ultrasound guidance, the left common femoral artery was accessed with micropuncture technique. Fluoroscopy was used to confirm cannulation over the femoral head. Sheathogram was not performed. The 83F sheath was upsized to 26F.   An 035 glidewire advantage was advanced into the distal aorta. Over the wire an omni flush catheter was advanced to the level of L2. Aortogram was performed - see above for details.   The right common iliac artery was selected with the 035 glidewire advantage. The wire was advanced into the common femoral artery. Over the wire the omni flush catheter was advanced into the external iliac artery. Selective angiography was performed - see above for details.   A mynx device was used to close the arteriotomy. Hemostasis was excellent upon completion.  Conscious sedation was administered with the use of IV fentanyl and midazolam under continuous physician and nurse monitoring.  Heart rate, blood pressure, and oxygen saturation were continuously monitored.  Total sedation time was 26 minutes  Upon completion of the case instrument and sharps counts were confirmed correct. The patient was transferred to the PACU in good condition. I was present for all portions of the procedure.  PLAN: patient needs an extended right femoral endarterectomy. Will plan for OR on Friday 05/20/20.  Yevonne Aline. Stanford Breed, MD [facility]  Phone Number: [phone] 05/18/2020 9:23  AM

## 2020-05-18 NOTE — Interval H&P Note (Signed)
History and Physical Interval Note:  05/18/2020 8:02 AM  William Perkins  has presented today for surgery, with the diagnosis of pad.  The various methods of treatment have been discussed with the patient and family. After consideration of risks, benefits and other options for treatment, the patient has consented to  Procedure(s): ABDOMINAL AORTOGRAM W/LOWER EXTREMITY (N/A) as a surgical intervention.  The patient's history has been reviewed, patient examined, no change in status, stable for surgery.  I have reviewed the patient's chart and labs.  Questions were answered to the patient's satisfaction.     Cherre Robins

## 2020-05-19 MED FILL — Midazolam HCl Inj 5 MG/5ML (Base Equivalent): INTRAMUSCULAR | Qty: 1 | Status: AC

## 2020-05-19 NOTE — Progress Notes (Signed)
I was unable to reach patient by phone.  I left  A message on voice mail.  I instructed the patient to arrive at [place] entrance at 0530 , register in the [facility]. DO NOT eat or drink anything after midnight.  I instructed the patient to take the following medications in the am with just enough water to get them down:Amlodipine, ASA, Clondine, Hydralyzine. Prn : Tylenol or Percocet. I asked patient to shower with antibacteria soap, dry off with a clean towel. not wear any lotions, powders, cologne, jewelry, piercing, make-up or nail polish.  Wear clean clothes. Brush teeth I instructed  patient to call [phone], in the am if there were any questions or problems.

## 2020-05-19 NOTE — Anesthesia Preprocedure Evaluation (Addendum)
Anesthesia Evaluation  Patient identified by MRN, date of birth, ID band Patient awake    Reviewed: Allergy & Precautions, NPO status , Patient's Chart, lab work & pertinent test results  History of Anesthesia Complications Negative for: history of anesthetic complications  Airway Mallampati: II  TM Distance: >3 FB Neck ROM: Full    Dental  (+) Edentulous Upper, Edentulous Lower, Dental Advisory Given   Pulmonary COPD, former smoker,    Pulmonary exam normal        Cardiovascular hypertension, Pt. on medications Normal cardiovascular exam     Neuro/Psych  Neuromuscular disease negative psych ROS   GI/Hepatic Neg liver ROS, GERD  Medicated,  Endo/Other  negative endocrine ROS  Renal/GU Renal InsufficiencyRenal disease     Musculoskeletal  (+) Arthritis ,   Abdominal   Peds  Hematology  (+) anemia ,   Anesthesia Other Findings   Reproductive/Obstetrics                            Anesthesia Physical  Anesthesia Plan  ASA: III  Anesthesia Plan: General   Post-op Pain Management:    Induction: Intravenous  PONV Risk Score and Plan: 4 or greater and Ondansetron, Dexamethasone and Aprepitant  Airway Management Planned: Oral ETT  Additional Equipment:   Intra-op Plan:   Post-operative Plan: Extubation in OR  Informed Consent: I have reviewed the patients History and Physical, chart, labs and discussed the procedure including the risks, benefits and alternatives for the proposed anesthesia with the patient or authorized representative who has indicated his/her understanding and acceptance.     Dental advisory given  Plan Discussed with: Anesthesiologist and CRNA  Anesthesia Plan Comments:        Anesthesia Quick Evaluation

## 2020-05-20 ENCOUNTER — Other Ambulatory Visit: Payer: Self-pay

## 2020-05-20 ENCOUNTER — Encounter (HOSPITAL_COMMUNITY): Payer: Self-pay | Admitting: Vascular Surgery

## 2020-05-20 ENCOUNTER — Inpatient Hospital Stay (HOSPITAL_COMMUNITY): Payer: Medicare Other | Admitting: Physician Assistant

## 2020-05-20 ENCOUNTER — Inpatient Hospital Stay (HOSPITAL_COMMUNITY)
Admission: RE | Admit: 2020-05-20 | Discharge: 2020-05-22 | DRG: 253 | Disposition: A | Discharge status: Home / Self Care | Payer: Medicare Other | Attending: Vascular Surgery | Admitting: Vascular Surgery

## 2020-05-20 ENCOUNTER — Encounter (HOSPITAL_COMMUNITY)
Admission: RE | Disposition: A | Discharge status: Home / Self Care | Payer: Self-pay | Source: Home / Self Care | Attending: Vascular Surgery

## 2020-05-20 DIAGNOSIS — N184 Chronic kidney disease, stage 4 (severe): Secondary | ICD-10-CM | POA: Diagnosis not present

## 2020-05-20 DIAGNOSIS — D62 Acute posthemorrhagic anemia: Secondary | ICD-10-CM | POA: Diagnosis not present

## 2020-05-20 DIAGNOSIS — Z7982 Long term (current) use of aspirin: Secondary | ICD-10-CM | POA: Diagnosis not present

## 2020-05-20 DIAGNOSIS — Z20822 Contact with and (suspected) exposure to covid-19: Secondary | ICD-10-CM | POA: Diagnosis not present

## 2020-05-20 DIAGNOSIS — N183 Chronic kidney disease, stage 3 unspecified: Secondary | ICD-10-CM | POA: Diagnosis not present

## 2020-05-20 DIAGNOSIS — E785 Hyperlipidemia, unspecified: Secondary | ICD-10-CM | POA: Diagnosis not present

## 2020-05-20 DIAGNOSIS — M25571 Pain in right ankle and joints of right foot: Secondary | ICD-10-CM | POA: Diagnosis present

## 2020-05-20 DIAGNOSIS — I129 Hypertensive chronic kidney disease with stage 1 through stage 4 chronic kidney disease, or unspecified chronic kidney disease: Secondary | ICD-10-CM | POA: Diagnosis not present

## 2020-05-20 DIAGNOSIS — Z79899 Other long term (current) drug therapy: Secondary | ICD-10-CM | POA: Diagnosis not present

## 2020-05-20 DIAGNOSIS — I70221 Atherosclerosis of native arteries of extremities with rest pain, right leg: Secondary | ICD-10-CM | POA: Diagnosis not present

## 2020-05-20 DIAGNOSIS — E1151 Type 2 diabetes mellitus with diabetic peripheral angiopathy without gangrene: Secondary | ICD-10-CM | POA: Diagnosis not present

## 2020-05-20 DIAGNOSIS — E1122 Type 2 diabetes mellitus with diabetic chronic kidney disease: Secondary | ICD-10-CM | POA: Diagnosis not present

## 2020-05-20 DIAGNOSIS — K219 Gastro-esophageal reflux disease without esophagitis: Secondary | ICD-10-CM | POA: Diagnosis not present

## 2020-05-20 DIAGNOSIS — D631 Anemia in chronic kidney disease: Secondary | ICD-10-CM | POA: Diagnosis not present

## 2020-05-20 DIAGNOSIS — D72829 Elevated white blood cell count, unspecified: Secondary | ICD-10-CM | POA: Diagnosis not present

## 2020-05-20 DIAGNOSIS — I70229 Atherosclerosis of native arteries of extremities with rest pain, unspecified extremity: Secondary | ICD-10-CM | POA: Diagnosis present

## 2020-05-20 DIAGNOSIS — Z87891 Personal history of nicotine dependence: Secondary | ICD-10-CM | POA: Diagnosis not present

## 2020-05-20 HISTORY — PX: ENDARTERECTOMY FEMORAL: SHX5804

## 2020-05-20 LAB — COMPREHENSIVE METABOLIC PANEL
ALT: 17 U/L (ref 0–44)
AST: 18 U/L (ref 15–41)
Albumin: 3.6 g/dL (ref 3.5–5.0)
Alkaline Phosphatase: 44 U/L (ref 38–126)
Anion gap: 10 (ref 5–15)
BUN: 30 mg/dL — ABNORMAL HIGH (ref 8–23)
CO2: 19 mmol/L — ABNORMAL LOW (ref 22–32)
Calcium: 9.2 mg/dL (ref 8.9–10.3)
Chloride: 103 mmol/L (ref 98–111)
Creatinine, Ser: 2.32 mg/dL — ABNORMAL HIGH (ref 0.61–1.24)
GFR, Estimated: 29 mL/min — ABNORMAL LOW (ref >60)
Glucose, Bld: 170 mg/dL — ABNORMAL HIGH (ref 70–99)
Potassium: 4.5 mmol/L (ref 3.5–5.1)
Sodium: 132 mmol/L — ABNORMAL LOW (ref 135–145)
Total Bilirubin: 0.6 mg/dL (ref 0.3–1.2)
Total Protein: 6.5 g/dL (ref 6.5–8.1)

## 2020-05-20 LAB — CBC
HCT: 29.3 % — ABNORMAL LOW (ref 39.0–52.0)
Hemoglobin: 9.5 g/dL — ABNORMAL LOW (ref 13.0–17.0)
MCH: 29.7 pg (ref 26.0–34.0)
MCHC: 32.4 g/dL (ref 30.0–36.0)
MCV: 91.6 fL (ref 80.0–100.0)
Platelets: 251 10*3/uL (ref 150–400)
RBC: 3.2 MIL/uL — ABNORMAL LOW (ref 4.22–5.81)
RDW: 12.5 % (ref 11.5–15.5)
WBC: 7.2 10*3/uL (ref 4.0–10.5)
nRBC: 0 % (ref 0.0–0.2)

## 2020-05-20 LAB — SURGICAL PCR SCREEN
MRSA, PCR: NEGATIVE
Staphylococcus aureus: NEGATIVE

## 2020-05-20 LAB — PROTIME-INR
INR: 1.2 (ref 0.8–1.2)
Prothrombin Time: 14.4 seconds (ref 11.4–15.2)

## 2020-05-20 LAB — APTT: aPTT: 29 seconds (ref 24–36)

## 2020-05-20 LAB — TYPE AND SCREEN
ABO/RH(D): B POS
Antibody Screen: NEGATIVE

## 2020-05-20 SURGERY — ENDARTERECTOMY, FEMORAL
Anesthesia: General | Laterality: Right

## 2020-05-20 MED ORDER — HYDRALAZINE HCL 20 MG/ML IJ SOLN: 5.0000 mg | INTRAMUSCULAR | Status: DC | PRN

## 2020-05-20 MED ORDER — HYDRALAZINE HCL 50 MG PO TABS
100.0000 mg | ORAL_TABLET | Freq: Two times a day (BID) | ORAL | Status: DC
  Administered 2020-05-20 – 2020-05-22 (×4): 100 mg via ORAL
  Filled 2020-05-20 (×4): qty 2

## 2020-05-20 MED ORDER — HEPARIN SODIUM (PORCINE) 1000 UNIT/ML IJ SOLN
INTRAMUSCULAR | Status: AC
  Filled 2020-05-20: qty 1

## 2020-05-20 MED ORDER — APREPITANT 40 MG PO CAPS
40.0000 mg | ORAL_CAPSULE | Freq: Once | ORAL | Status: AC
  Administered 2020-05-20: 40 mg via ORAL
  Filled 2020-05-20: qty 1

## 2020-05-20 MED ORDER — PANTOPRAZOLE SODIUM 40 MG PO TBEC
40.0000 mg | DELAYED_RELEASE_TABLET | Freq: Every day | ORAL | Status: DC
  Administered 2020-05-20 – 2020-05-22 (×3): 40 mg via ORAL
  Filled 2020-05-20 (×3): qty 1

## 2020-05-20 MED ORDER — SUCCINYLCHOLINE CHLORIDE 200 MG/10ML IV SOSY
PREFILLED_SYRINGE | INTRAVENOUS | Status: AC
  Filled 2020-05-20: qty 10

## 2020-05-20 MED ORDER — CEFAZOLIN SODIUM-DEXTROSE 2-4 GM/100ML-% IV SOLN
2.0000 g | INTRAVENOUS | Status: AC
  Administered 2020-05-20: 2 g via INTRAVENOUS

## 2020-05-20 MED ORDER — MIDAZOLAM HCL 2 MG/2ML IJ SOLN
INTRAMUSCULAR | Status: AC
  Filled 2020-05-20: qty 2

## 2020-05-20 MED ORDER — AMLODIPINE BESYLATE 10 MG PO TABS
10.0000 mg | ORAL_TABLET | Freq: Every day | ORAL | Status: DC
  Administered 2020-05-21 – 2020-05-22 (×2): 10 mg via ORAL
  Filled 2020-05-20 (×2): qty 1

## 2020-05-20 MED ORDER — GUAIFENESIN-DM 100-10 MG/5ML PO SYRP: 15.0000 mL | ORAL_SOLUTION | ORAL | Status: DC | PRN

## 2020-05-20 MED ORDER — CHLORHEXIDINE GLUCONATE CLOTH 2 % EX PADS: 6.0000 | MEDICATED_PAD | Freq: Once | CUTANEOUS | Status: DC

## 2020-05-20 MED ORDER — SODIUM CHLORIDE 0.9 % IV SOLN
INTRAVENOUS | Status: AC
  Filled 2020-05-20: qty 1.2

## 2020-05-20 MED ORDER — DOCUSATE SODIUM 100 MG PO CAPS
100.0000 mg | ORAL_CAPSULE | Freq: Every day | ORAL | Status: DC
  Administered 2020-05-21 – 2020-05-22 (×2): 100 mg via ORAL
  Filled 2020-05-20 (×2): qty 1

## 2020-05-20 MED ORDER — SODIUM CHLORIDE 0.9 % IV SOLN: INTRAVENOUS | Status: DC

## 2020-05-20 MED ORDER — MAGNESIUM SULFATE 2 GM/50ML IV SOLN: 2.0000 g | Freq: Every day | INTRAVENOUS | Status: DC | PRN

## 2020-05-20 MED ORDER — HEPARIN SODIUM (PORCINE) 1000 UNIT/ML IJ SOLN
INTRAMUSCULAR | Status: DC | PRN
  Administered 2020-05-20: 7500 [IU] via INTRAVENOUS
  Administered 2020-05-20: 2000 [IU] via INTRAVENOUS
  Administered 2020-05-20: 2500 [IU] via INTRAVENOUS

## 2020-05-20 MED ORDER — EPHEDRINE SULFATE-NACL 50-0.9 MG/10ML-% IV SOSY
PREFILLED_SYRINGE | INTRAVENOUS | Status: DC | PRN
  Administered 2020-05-20: 10 mg via INTRAVENOUS

## 2020-05-20 MED ORDER — DEXAMETHASONE SODIUM PHOSPHATE 10 MG/ML IJ SOLN
INTRAMUSCULAR | Status: DC | PRN
  Administered 2020-05-20: 10 mg via INTRAVENOUS

## 2020-05-20 MED ORDER — MIDAZOLAM HCL 5 MG/5ML IJ SOLN
INTRAMUSCULAR | Status: DC | PRN
  Administered 2020-05-20: 1 mg via INTRAVENOUS

## 2020-05-20 MED ORDER — HEPARIN SODIUM (PORCINE) 5000 UNIT/ML IJ SOLN
5000.0000 [IU] | Freq: Three times a day (TID) | INTRAMUSCULAR | Status: DC
  Administered 2020-05-21 – 2020-05-22 (×4): 5000 [IU] via SUBCUTANEOUS
  Filled 2020-05-20 (×3): qty 1

## 2020-05-20 MED ORDER — ROCURONIUM BROMIDE 10 MG/ML (PF) SYRINGE
PREFILLED_SYRINGE | INTRAVENOUS | Status: DC | PRN
  Administered 2020-05-20: 20 mg via INTRAVENOUS
  Administered 2020-05-20: 30 mg via INTRAVENOUS

## 2020-05-20 MED ORDER — ACETAMINOPHEN 650 MG RE SUPP
325.0000 mg | RECTAL | Status: DC | PRN
Start: 2020-05-20 — End: 2020-05-22

## 2020-05-20 MED ORDER — PROPOFOL 10 MG/ML IV BOLUS
INTRAVENOUS | Status: AC
  Filled 2020-05-20: qty 40

## 2020-05-20 MED ORDER — ALUM & MAG HYDROXIDE-SIMETH 200-200-20 MG/5ML PO SUSP
15.0000 mL | ORAL | Status: DC | PRN
  Administered 2020-05-20 – 2020-05-21 (×4): 30 mL via ORAL
  Filled 2020-05-20 (×4): qty 30

## 2020-05-20 MED ORDER — METOPROLOL TARTRATE 5 MG/5ML IV SOLN: 2.0000 mg | INTRAVENOUS | Status: DC | PRN

## 2020-05-20 MED ORDER — HYDROMORPHONE HCL 1 MG/ML IJ SOLN
0.5000 mg | INTRAMUSCULAR | Status: DC | PRN
  Administered 2020-05-20: 1 mg via INTRAVENOUS
  Filled 2020-05-20: qty 1

## 2020-05-20 MED ORDER — LIDOCAINE 2% (20 MG/ML) 5 ML SYRINGE
INTRAMUSCULAR | Status: DC | PRN
  Administered 2020-05-20: 60 mg via INTRAVENOUS

## 2020-05-20 MED ORDER — OXYCODONE-ACETAMINOPHEN 5-325 MG PO TABS
1.0000 | ORAL_TABLET | ORAL | Status: DC | PRN
  Administered 2020-05-20: 2 via ORAL
  Filled 2020-05-20: qty 2

## 2020-05-20 MED ORDER — ONDANSETRON HCL 4 MG/2ML IJ SOLN: 4.0000 mg | Freq: Four times a day (QID) | INTRAMUSCULAR | Status: DC | PRN

## 2020-05-20 MED ORDER — ACETAMINOPHEN 325 MG PO TABS
325.0000 mg | ORAL_TABLET | ORAL | Status: DC | PRN
Start: 2020-05-20 — End: 2020-05-22

## 2020-05-20 MED ORDER — FENTANYL CITRATE (PF) 100 MCG/2ML IJ SOLN: 25.0000 ug | INTRAMUSCULAR | Status: DC | PRN

## 2020-05-20 MED ORDER — BISACODYL 5 MG PO TBEC: 5.0000 mg | DELAYED_RELEASE_TABLET | Freq: Every day | ORAL | Status: DC | PRN

## 2020-05-20 MED ORDER — ASPIRIN EC 81 MG PO TBEC
81.0000 mg | DELAYED_RELEASE_TABLET | Freq: Every day | ORAL | Status: DC
Start: 2020-05-21 — End: 2020-05-22
  Administered 2020-05-21 – 2020-05-22 (×2): 81 mg via ORAL
  Filled 2020-05-20 (×2): qty 1

## 2020-05-20 MED ORDER — LABETALOL HCL 5 MG/ML IV SOLN: 10.0000 mg | INTRAVENOUS | Status: DC | PRN

## 2020-05-20 MED ORDER — 0.9 % SODIUM CHLORIDE (POUR BTL) OPTIME
TOPICAL | Status: DC | PRN
  Administered 2020-05-20: 2000 mL

## 2020-05-20 MED ORDER — POTASSIUM CHLORIDE CRYS ER 20 MEQ PO TBCR: 20.0000 meq | EXTENDED_RELEASE_TABLET | Freq: Every day | ORAL | Status: DC | PRN

## 2020-05-20 MED ORDER — PROPOFOL 10 MG/ML IV BOLUS
INTRAVENOUS | Status: DC | PRN
  Administered 2020-05-20: 150 mg via INTRAVENOUS

## 2020-05-20 MED ORDER — SODIUM CHLORIDE 0.9 % IV SOLN: 500.0000 mL | Freq: Once | INTRAVENOUS | Status: DC | PRN

## 2020-05-20 MED ORDER — CHLORHEXIDINE GLUCONATE 0.12 % MT SOLN: 15.0000 mL | Freq: Once | OROMUCOSAL | Status: AC

## 2020-05-20 MED ORDER — PROTAMINE SULFATE 10 MG/ML IV SOLN
INTRAVENOUS | Status: AC
  Filled 2020-05-20: qty 5

## 2020-05-20 MED ORDER — CEFAZOLIN SODIUM-DEXTROSE 2-4 GM/100ML-% IV SOLN
2.0000 g | Freq: Three times a day (TID) | INTRAVENOUS | Status: AC
  Administered 2020-05-20 (×2): 2 g via INTRAVENOUS
  Filled 2020-05-20 (×3): qty 100

## 2020-05-20 MED ORDER — PROTAMINE SULFATE 10 MG/ML IV SOLN
INTRAVENOUS | Status: DC | PRN
  Administered 2020-05-20: 50 mg via INTRAVENOUS

## 2020-05-20 MED ORDER — ROCURONIUM BROMIDE 10 MG/ML (PF) SYRINGE
PREFILLED_SYRINGE | INTRAVENOUS | Status: AC
  Filled 2020-05-20: qty 10

## 2020-05-20 MED ORDER — CHLORHEXIDINE GLUCONATE 0.12 % MT SOLN
OROMUCOSAL | Status: AC
  Administered 2020-05-20: 15 mL via OROMUCOSAL
  Filled 2020-05-20: qty 15

## 2020-05-20 MED ORDER — PRAVASTATIN SODIUM 40 MG PO TABS
40.0000 mg | ORAL_TABLET | Freq: Every evening | ORAL | Status: DC
  Administered 2020-05-20 – 2020-05-21 (×2): 40 mg via ORAL
  Filled 2020-05-20 (×2): qty 1

## 2020-05-20 MED ORDER — HEMOSTATIC AGENTS (NO CHARGE) OPTIME
TOPICAL | Status: DC | PRN
  Administered 2020-05-20 (×2): 1 via TOPICAL

## 2020-05-20 MED ORDER — PHENYLEPHRINE HCL-NACL 10-0.9 MG/250ML-% IV SOLN
INTRAVENOUS | Status: DC | PRN
  Administered 2020-05-20: 40 ug/min via INTRAVENOUS

## 2020-05-20 MED ORDER — SENNOSIDES-DOCUSATE SODIUM 8.6-50 MG PO TABS: 1.0000 | ORAL_TABLET | Freq: Every evening | ORAL | Status: DC | PRN

## 2020-05-20 MED ORDER — SPIRONOLACTONE 25 MG PO TABS
25.0000 mg | ORAL_TABLET | Freq: Every day | ORAL | Status: DC
  Administered 2020-05-21 – 2020-05-22 (×2): 25 mg via ORAL
  Filled 2020-05-20 (×2): qty 1

## 2020-05-20 MED ORDER — ACETAMINOPHEN 500 MG PO TABS
1000.0000 mg | ORAL_TABLET | Freq: Once | ORAL | Status: AC
  Administered 2020-05-20: 1000 mg via ORAL
  Filled 2020-05-20: qty 2

## 2020-05-20 MED ORDER — FENTANYL CITRATE (PF) 100 MCG/2ML IJ SOLN
INTRAMUSCULAR | Status: DC | PRN
  Administered 2020-05-20: 100 ug via INTRAVENOUS
  Administered 2020-05-20: 50 ug via INTRAVENOUS

## 2020-05-20 MED ORDER — SODIUM CHLORIDE 0.9 % IV SOLN
INTRAVENOUS | Status: DC | PRN
  Administered 2020-05-20: 500 mL

## 2020-05-20 MED ORDER — LISINOPRIL 40 MG PO TABS
40.0000 mg | ORAL_TABLET | Freq: Every day | ORAL | Status: DC
  Administered 2020-05-21 – 2020-05-22 (×2): 40 mg via ORAL
  Filled 2020-05-20 (×2): qty 1

## 2020-05-20 MED ORDER — CEFAZOLIN SODIUM-DEXTROSE 2-4 GM/100ML-% IV SOLN
INTRAVENOUS | Status: AC
  Filled 2020-05-20: qty 100

## 2020-05-20 MED ORDER — LACTATED RINGERS IV SOLN: INTRAVENOUS | Status: DC

## 2020-05-20 MED ORDER — FENTANYL CITRATE (PF) 250 MCG/5ML IJ SOLN
INTRAMUSCULAR | Status: AC
  Filled 2020-05-20: qty 5

## 2020-05-20 MED ORDER — ONDANSETRON HCL 4 MG/2ML IJ SOLN
INTRAMUSCULAR | Status: DC | PRN
  Administered 2020-05-20: 4 mg via INTRAVENOUS

## 2020-05-20 MED ORDER — PHENOL 1.4 % MT LIQD: 1.0000 | OROMUCOSAL | Status: DC | PRN

## 2020-05-20 MED ORDER — ORAL CARE MOUTH RINSE: 15.0000 mL | Freq: Once | OROMUCOSAL | Status: AC

## 2020-05-20 MED ORDER — CLONIDINE HCL 0.2 MG PO TABS
0.2000 mg | ORAL_TABLET | Freq: Two times a day (BID) | ORAL | Status: DC
Start: 2020-05-20 — End: 2020-05-22
  Administered 2020-05-20 – 2020-05-22 (×4): 0.2 mg via ORAL
  Filled 2020-05-20 (×4): qty 1

## 2020-05-20 MED ORDER — LIDOCAINE 2% (20 MG/ML) 5 ML SYRINGE
INTRAMUSCULAR | Status: AC
  Filled 2020-05-20: qty 5

## 2020-05-20 MED ORDER — GLYCOPYRROLATE PF 0.2 MG/ML IJ SOSY
PREFILLED_SYRINGE | INTRAMUSCULAR | Status: AC
  Filled 2020-05-20: qty 1

## 2020-05-20 SURGICAL SUPPLY — 47 items
ADH SKN CLS APL DERMABOND .7 (GAUZE/BANDAGES/DRESSINGS) ×1
APL PRP STRL LF DISP 70% ISPRP (MISCELLANEOUS) ×1
APL SKNCLS STERI-STRIP NONHPOA (GAUZE/BANDAGES/DRESSINGS)
BENZOIN TINCTURE PRP APPL 2/3 (GAUZE/BANDAGES/DRESSINGS) ×1 IMPLANT
CANISTER SUCT 3000ML PPV (MISCELLANEOUS) ×2 IMPLANT
CANNULA VESSEL 3MM 2 BLNT TIP (CANNULA) ×4 IMPLANT
CHLORAPREP W/TINT 26 (MISCELLANEOUS) ×2 IMPLANT
CLIP VESOCCLUDE MED 24/CT (CLIP) ×2 IMPLANT
CLIP VESOCCLUDE SM WIDE 24/CT (CLIP) ×2 IMPLANT
COVER WAND RF STERILE (DRAPES) ×1 IMPLANT
DERMABOND ADVANCED (GAUZE/BANDAGES/DRESSINGS) ×1
DERMABOND ADVANCED .7 DNX12 (GAUZE/BANDAGES/DRESSINGS) IMPLANT
DRAIN CHANNEL 15F RND FF W/TCR (WOUND CARE) IMPLANT
ELECT REM PT RETURN 9FT ADLT (ELECTROSURGICAL) ×2
ELECTRODE REM PT RTRN 9FT ADLT (ELECTROSURGICAL) ×1 IMPLANT
EVACUATOR SILICONE 100CC (DRAIN) IMPLANT
GAUZE SPONGE 4X4 12PLY STRL (GAUZE/BANDAGES/DRESSINGS) ×1 IMPLANT
GLOVE SURG SS PI 8.0 STRL IVOR (GLOVE) ×2 IMPLANT
GOWN STRL REUS W/ TWL LRG LVL3 (GOWN DISPOSABLE) ×2 IMPLANT
GOWN STRL REUS W/ TWL XL LVL3 (GOWN DISPOSABLE) ×1 IMPLANT
GOWN STRL REUS W/TWL LRG LVL3 (GOWN DISPOSABLE) ×8
GOWN STRL REUS W/TWL XL LVL3 (GOWN DISPOSABLE) ×2
GRAFT VASC PATCH XENOSURE 1X14 (Vascular Products) ×1 IMPLANT
HEMOSTAT SNOW SURGICEL 2X4 (HEMOSTASIS) ×1 IMPLANT
KIT BASIN OR (CUSTOM PROCEDURE TRAY) ×2 IMPLANT
KIT TURNOVER KIT B (KITS) ×2 IMPLANT
LOOP VESSEL MINI RED (MISCELLANEOUS) ×1 IMPLANT
NS IRRIG 1000ML POUR BTL (IV SOLUTION) ×5 IMPLANT
PACK PERIPHERAL VASCULAR (CUSTOM PROCEDURE TRAY) ×2 IMPLANT
PAD ARMBOARD 7.5X6 YLW CONV (MISCELLANEOUS) ×4 IMPLANT
STRIP CLOSURE SKIN 1/4X4 (GAUZE/BANDAGES/DRESSINGS) IMPLANT
SUT ETHILON 3 0 PS 1 (SUTURE) IMPLANT
SUT MNCRL AB 4-0 PS2 18 (SUTURE) ×2 IMPLANT
SUT PROLENE 5 0 C 1 24 (SUTURE) ×2 IMPLANT
SUT PROLENE 5 0 C 1 36 (SUTURE) ×3 IMPLANT
SUT PROLENE 6 0 BV (SUTURE) ×7 IMPLANT
SUT VIC AB 2-0 CT1 18 (SUTURE) ×1 IMPLANT
SUT VIC AB 2-0 CT1 27 (SUTURE) ×4
SUT VIC AB 2-0 CT1 TAPERPNT 27 (SUTURE) ×1 IMPLANT
SUT VIC AB 3-0 SH 27 (SUTURE) ×4
SUT VIC AB 3-0 SH 27X BRD (SUTURE) ×1 IMPLANT
TAPE CLOTH SURG 4X10 WHT LF (GAUZE/BANDAGES/DRESSINGS) ×1 IMPLANT
TOWEL GREEN STERILE (TOWEL DISPOSABLE) ×3 IMPLANT
TOWEL GREEN STERILE FF (TOWEL DISPOSABLE) ×2 IMPLANT
TRAY FOLEY MTR SLVR 16FR STAT (SET/KITS/TRAYS/PACK) ×1 IMPLANT
UNDERPAD 30X36 HEAVY ABSORB (UNDERPADS AND DIAPERS) ×2 IMPLANT
WATER STERILE IRR 1000ML POUR (IV SOLUTION) ×2 IMPLANT

## 2020-05-20 NOTE — Evaluation (Signed)
Occupational Therapy Evaluation Patient Details Name: William Perkins MRN: [id number] DOB: Jun 14, 1950 Today's Date: 05/20/2020    History of Present Illness 70 y.o. male with atherosclerosis of native arteries of right lower extremity causing ischemic rest pain.  PMH includes HTN, Hyperlipidemia, L-spine fusion in 2020.  Patient brought his back brace with him.   Clinical Impression   Patient session kept at basic stand pivot transfer this date due to increased c/o light headedness after receiving IV pain meds.  Patient admitted for the diagnosis and procedure above.  PTA, he needed a RW in the community, but did not use an AD in the home.  He states he could walk maybe 20' at a time before having to stop due to leg pain.  He was able to complete his own ADL.  Deficits impacting independence are listed below.  Currently he is needing up to [place] with all transfers and ADL from a sit/stand level.  OT is indicated in the acute setting to maximize functional independence prior to returning home.      Follow Up Recommendations  No OT follow up    Equipment Recommendations  Tub/shower seat    Recommendations for Other Services       Precautions / Restrictions Precautions Precautions: Fall Precaution Comments: chronic back pain Restrictions Weight Bearing Restrictions: No      Mobility Bed Mobility Overal bed mobility: Needs Assistance Bed Mobility: Supine to Sit;Sit to Supine     Supine to sit: Supervision;HOB elevated Sit to supine: Supervision;HOB elevated     Patient Response: Cooperative  Transfers Overall transfer level: Needs assistance   Transfers: Sit to/from [employer] Transfers Sit to Stand: Min assist Stand pivot transfers: Min assist            Balance Overall balance assessment: Needs assistance Sitting-balance support: Feet supported Sitting balance-Leahy Scale: Good     Standing balance support: Single extremity supported Standing  balance-Leahy Scale: Poor Standing balance comment: reliant on HHA for basic transfer.                           ADL either performed or assessed with clinical judgement   ADL Overall ADL's : Needs assistance/impaired                 Upper Body Dressing : Min guard;Sitting   Lower Body Dressing: Minimal assistance;Sit to/from stand               Functional mobility during ADLs: Min guard;Minimal assistance General ADL Comments: HHA for basic transfer to recliner and back to bed.     Vision Baseline Vision/History: Wears glasses Wears Glasses: At all times Patient Visual Report: No change from baseline                  Pertinent Vitals/Pain Pain Assessment: Faces Faces Pain Scale: Hurts little more Pain Location: R leg.  Just received IV pain meds. Pain Descriptors / Indicators: Tender;Grimacing;Guarding Pain Intervention(s): Premedicated before session     Hand Dominance Right   Extremity/Trunk Assessment Upper Extremity Assessment Upper Extremity Assessment: Generalized weakness   Lower Extremity Assessment Lower Extremity Assessment: Defer to PT evaluation   Cervical / Trunk Assessment Cervical / Trunk Assessment: Normal   Communication Communication Communication: No difficulties;HOH   Cognition Arousal/Alertness: Awake/alert Behavior During Therapy: WFL for tasks assessed/performed Overall Cognitive Status: Within Functional Limits for tasks assessed  Home Living Family/patient expects to be discharged to:: Private residence Living Arrangements: Spouse/significant other Available Help at Discharge: Family;Available 24 hours/day Type of Home: House Home Access: Level entry     Home Layout: One level     Bathroom Shower/Tub: Teacher, early years/pre: Standard     Home Equipment: Environmental consultant - 4 wheels   Additional Comments: Wife worked at  [employer] as an Therapist, sports.      Prior Functioning/Environment Level of Independence: Independent with assistive device(s)        Comments: Patient used RW in the community only.  Walked in the home without an AD.  Spouse completes meal prep and home managment.  Patient was independent with ADL, meds, and still drives.        OT Problem List: Decreased strength;Decreased activity tolerance;Impaired balance (sitting and/or standing);Pain      OT Treatment/Interventions: Self-care/ADL training;DME and/or AE instruction;Balance training;Therapeutic activities    OT Goals(Current goals can be found in the care plan section) Acute Rehab OT Goals Patient Stated Goal: Hoping to be able to walk without the RW and pain. OT Goal Formulation: With patient Time For Goal Achievement: 06/03/20 Potential to Achieve Goals: Good  OT Frequency: Min 2X/week   Barriers to D/C:    none noted       Co-evaluation              AM-PAC OT "6 Clicks" Daily Activity     Outcome Measure Help from another person eating meals?: None Help from another person taking care of personal grooming?: None Help from another person toileting, which includes using toliet, bedpan, or urinal?: A Little Help from another person bathing (including washing, rinsing, drying)?: A Little Help from another person to put on and taking off regular upper body clothing?: A Little Help from another person to put on and taking off regular lower body clothing?: A Little 6 Click Score: 20   End of Session Nurse Communication: Mobility status  Activity Tolerance: Patient tolerated treatment well Patient left: in bed;with call bell/phone within reach  OT Visit Diagnosis: Unsteadiness on feet (R26.81);Pain Pain - Right/Left: Right Pain - part of body: Leg                Time: 3419-3790 OT Time Calculation (min): 19 min Charges:  OT General Charges $OT Visit: 1 Visit OT Evaluation $OT Eval Moderate Complexity: 1  Mod  05/20/2020  Rich, OTR/L  Acute Rehabilitation Services  Office:  [phone]   Metta Clines 05/20/2020, 4:58 PM

## 2020-05-20 NOTE — Interval H&P Note (Signed)
History and Physical Interval Note:  05/20/2020 7:17 AM  Darcella Gasman  has presented today for surgery, with the diagnosis of PAD.  The various methods of treatment have been discussed with the patient and family. After consideration of risks, benefits and other options for treatment, the patient has consented to  Procedure(s): RIGHT EXTENDED FEMORAL ENDARTERECTOMY AND PROFUNDAPLASTY (Right) as a surgical intervention.  The patient's history has been reviewed, patient examined, no change in status, stable for surgery.  I have reviewed the patient's chart and labs.  Questions were answered to the patient's satisfaction.     Cherre Robins

## 2020-05-20 NOTE — Anesthesia Postprocedure Evaluation (Signed)
Anesthesia Post Note  Patient: William Perkins  Procedure(s) Performed: RIGHT EXTENDED FEMORAL ENDARTERECTOMY AND PROFUNDAPLASTY (Right )     Patient location during evaluation: PACU Anesthesia Type: General Level of consciousness: sedated Pain management: pain level controlled Vital Signs Assessment: post-procedure vital signs reviewed and stable Respiratory status: spontaneous breathing and respiratory function stable Cardiovascular status: stable Postop Assessment: no apparent nausea or vomiting Anesthetic complications: no   No complications documented.  Last Vitals:  Vitals:   05/20/20 1200 05/20/20 1246  BP: (!) 126/57 (!) 136/57  Pulse: (!) 49 (!) 50  Resp: 16 19  Temp:    SpO2: 100% 100%    Last Pain:  Vitals:   05/20/20 1215  TempSrc:   PainSc: 3                  SINGER,JAMES DANIEL

## 2020-05-20 NOTE — Evaluation (Signed)
Physical Therapy Evaluation Patient Details Name: William Perkins MRN: [id number] DOB: 21-Nov-1950 Today's Date: 05/20/2020   History of Present Illness  70 y.o. male with atherosclerosis of native arteries of right lower extremity causing ischemic rest pain.  PMH includes HTN, Hyperlipidemia, L-spine fusion in 2020.  Patient brought his back brace with him.  Clinical Impression  Pt admitted with/for revascularization with R LE endarterectomies.  Pt currently limited functionally due to the problems listed below.  (see problems list.)  Pt will benefit from PT to maximize function and safety to be able to get home safely with available assist .     Follow Up Recommendations No PT follow up    Equipment Recommendations  None recommended by PT    Recommendations for Other Services       Precautions / Restrictions Precautions Precautions: Fall Precaution Comments: chronic back pain Restrictions Weight Bearing Restrictions: No      Mobility  Bed Mobility Overal bed mobility: Needs Assistance Bed Mobility: Supine to Sit;Sit to Supine     Supine to sit: Supervision;HOB elevated Sit to supine: Supervision;HOB elevated        Transfers Overall transfer level: Needs assistance   Transfers: Sit to/from Stand Sit to Stand: Min assist Stand pivot transfers: Min assist       General transfer comment: cues for hand placement, boost  Ambulation/Gait Ambulation/Gait assistance: Supervision;Min guard Gait Distance (Feet): 300 Feet Assistive device: Rolling walker (2 wheeled) Gait Pattern/deviations: Step-through pattern   Gait velocity interpretation: 1.31 - 2.62 ft/sec, indicative of limited community ambulator General Gait Details: steady and antalgic, improved gait quality as he progressed  Financial trader Rankin (Stroke Patients Only)       Balance Overall balance assessment: Needs assistance Sitting-balance support: Feet  supported Sitting balance-Leahy Scale: Good     Standing balance support: Single extremity supported Standing balance-Leahy Scale: Poor Standing balance comment: reliant on the RW                             Pertinent Vitals/Pain Pain Assessment: Faces Faces Pain Scale: Hurts little more Pain Location: R LE Pain Descriptors / Indicators: Tender;Grimacing;Guarding Pain Intervention(s): Monitored during session    Home Living Family/patient expects to be discharged to:: Private residence Living Arrangements: Spouse/significant other Available Help at Discharge: Family;Available 24 hours/day Type of Home: House Home Access: Level entry     Home Layout: One level Home Equipment: Walker - 4 wheels Additional Comments: Wife worked at [employer] as an Therapist, sports.    Prior Function Level of Independence: Independent with assistive device(s)         Comments: Patient used RW in the community only.  Walked in the home without an AD.  Spouse completes meal prep and home managment.  Patient was independent with ADL, meds, and still drives.     Hand Dominance   Dominant Hand: Right    Extremity/Trunk Assessment   Upper Extremity Assessment Upper Extremity Assessment: Overall WFL for tasks assessed    Lower Extremity Assessment Lower Extremity Assessment: Overall WFL for tasks assessed (R LE pain, antalgic gait)    Cervical / Trunk Assessment Cervical / Trunk Assessment: Normal  Communication   Communication: No difficulties;HOH  Cognition Arousal/Alertness: Awake/alert Behavior During Therapy: WFL for tasks assessed/performed Overall Cognitive Status: Within Functional Limits for tasks assessed  General Comments General comments (skin integrity, edema, etc.): vss    Exercises     Assessment/Plan    PT Assessment Patient needs continued PT services  PT Problem List Decreased strength;Decreased activity  tolerance;Decreased mobility;Decreased knowledge of use of DME;Pain       PT Treatment Interventions Gait training;Functional mobility training;DME instruction;Therapeutic activities;Patient/family education    PT Goals (Current goals can be found in the Care Plan section)  Acute Rehab PT Goals Patient Stated Goal: Hoping to be able to walk without the RW and pain. PT Goal Formulation: With patient Time For Goal Achievement: 05/27/20 Potential to Achieve Goals: Good    Frequency Min 3X/week   Barriers to discharge        Co-evaluation               AM-PAC PT "6 Clicks" Mobility  Outcome Measure Help needed turning from your back to your side while in a flat bed without using bedrails?: None Help needed moving from lying on your back to sitting on the side of a flat bed without using bedrails?: None Help needed moving to and from a bed to a chair (including a wheelchair)?: A Little Help needed standing up from a chair using your arms (e.g., wheelchair or bedside chair)?: A Little Help needed to walk in hospital room?: A Little Help needed climbing 3-5 steps with a railing? : A Little 6 Click Score: 20    End of Session   Activity Tolerance: Patient tolerated treatment well Patient left: in bed Nurse Communication: Mobility status PT Visit Diagnosis: Other abnormalities of gait and mobility (R26.89);Difficulty in walking, not elsewhere classified (R26.2)    Time: 0051-1021 PT Time Calculation (min) (ACUTE ONLY): 19 min   Charges:   PT Evaluation $PT Eval Moderate Complexity: 1 Mod          05/20/2020  Ginger Carne., PT Acute Rehabilitation Services [phone]  (pager) [phone]  (office)  Tessie Fass Junice Fei 05/20/2020, 5:48 PM

## 2020-05-20 NOTE — Progress Notes (Signed)
Patient arrived to the floor from femoral endarterectomy at 1246. VS are stable. Wife is at bedside.   Raelyn Number, RN 05/20/20 2:59 PM

## 2020-05-20 NOTE — Transfer of Care (Signed)
Immediate Anesthesia Transfer of Care Note  Patient: TRAVANTE KNEE  Procedure(s) Performed: RIGHT EXTENDED FEMORAL ENDARTERECTOMY AND PROFUNDAPLASTY (Right )  Patient Location: PACU  Anesthesia Type:General  Level of Consciousness: awake  Airway & Oxygen Therapy: Patient Spontanous Breathing and Patient connected to face mask oxygen  Post-op Assessment: Report given to RN and Post -op Vital signs reviewed and stable  Post vital signs: Reviewed and stable  Last Vitals:  Vitals Value Taken Time  BP 134/60 05/20/20 1116  Temp 36.9 C 05/20/20 1116  Pulse 55 05/20/20 1126  Resp 10 05/20/20 1126  SpO2 100 % 05/20/20 1126  Vitals shown include unvalidated device data.  Last Pain:  Vitals:   05/20/20 0655  TempSrc:   PainSc: 7       Patients Stated Pain Goal: 3 (02/72/53 6644)  Complications: No complications documented.

## 2020-05-20 NOTE — Op Note (Addendum)
DATE OF SERVICE: 05/20/2020  PATIENT:  William Perkins  70 y.o. male  PRE-OPERATIVE DIAGNOSIS:  Atherosclerosis of native arteries of right lower extremity causing ischemic rest pain  POST-OPERATIVE DIAGNOSIS:  Same  PROCEDURE:   1) right common femoral endarterectomy and bovine pericardial patch angioplasty 2) right superficial femoral endarterectomy and pericardial patch angioplasty 3) right extended profunda femoris endarterectomy (to second order branches) and bovine pericardial patch angioplasty  SURGEON:  Surgeon(s) and Role:    * William Robins, MD - Primary  ASSISTANT: William Fruit, PA-C  An assistant was required to facilitate exposure and expedite the case.  ANESTHESIA:   general  EBL: 132m  BLOOD ADMINISTERED:none  DRAINS: none   LOCAL MEDICATIONS USED:  NONE  SPECIMEN:  none  COUNTS: confirmed correct.  TOURNIQUET:  None  PATIENT DISPOSITION:  PACU - hemodynamically stable.   Delay start of Pharmacological VTE agent (>24hrs) due to surgical blood loss or risk of bleeding: no  INDICATION FOR PROCEDURE: William BIALECKIis a 70y.o. male with ischemic rest pain of the right leg.  He underwent an angiogram of the right lower extremity which revealed near occlusive plaque of the right proximal profunda femoris artery and right superficial femoral artery. After careful discussion of risks, benefits, and alternatives the patient was offered extended femoral endarterectomy. The patient understood and wished to proceed.  OPERATIVE FINDINGS: Severe plaque throughout the femoral bifurcation.  Exposure carried down onto second order branch of the posterior branch of the profunda femoris artery.  The anterior branch of the profunda femoris artery was widely patent after endarterectomy.  Second-order branches of the posterior branch required endarterectomy but had good Doppler flow at completion.  Proximal 4 cm of superficial femoral artery near occlusive plaque were removed  with palpable pulse noted in the distal superficial femoral artery upon completion.  DESCRIPTION OF PROCEDURE: After identification of the patient in the pre-operative holding area, the patient was transferred to the operating room. The patient was positioned supine on the operating room table. Anesthesia was induced. The right groin was prepped and draped in standard fashion. A surgical pause was performed confirming correct patient, procedure, and operative location.  Using intraoperative ultrasound, the course of the common femoral, superficial femoral, profunda femoris artery on the right were mapped.  A longitudinal incision was made from the inguinal ligament to the proximal third of the thigh.  This is carried down through subcutaneous tissue until the femoral sheath was encountered.  The femoral sheath was entered sharply.  The femoral bifurcation was exposed sharply.  I carried exposure cranially to the external iliac artery.  Exposure was carried distally on the superficial femoral artery several centimeters.  Exposure was carried down on the profunda femoris artery taking great care to ligate crossing vein branches before exposing the anterior and posterior divisions.  As a femoral artery was exposed, I noted extensive plaque extending in the second-order branches of the posterior division.  The anterior division appeared dominant and had a mild burden of disease.  I encircled all of the exposed profunda branches with Silastic Vesseloops.  The superficial femoral artery was encircled with Silastic vessel loop.  A Henley clamp was slotted in the distal external iliac artery.  Patient was systemically heparinized.  He was redosed every 30 minutes empirically as the case proceeded.  An anterior arteriotomy was made on the second order branch of the posterior division of the profunda femoris artery and carried on the anterior aspect of the artery  to the common femoral artery.  Extensive endarterectomy  was performed using standard technique using a Primary school teacher.  At branch points including the anterior division of the profunda femoris artery eversion endarterectomy was performed.  The endarterectomy plane was carefully inspected any loose debris irrigated or manually removed from the bed.  An anterior arteriotomy was made on the superficial femoral artery and carried cranially to the previously made endarterectomy on the common femoral artery.  Endarterectomy was performed using standard technique using a Primary school teacher. The endarterectomy plane was carefully inspected any loose debris irrigated or manually removed from the bed.  The proximal and distal endpoints of the endarterectomy were all tacked with 3 sutures of 6-0 Prolene to avoid intimal flap.  The "crotch" of the femoral bifurcation was reapproximated using interrupted U stitches of 5-0 Prolene.  A 1 cm x 16 cm bovine pericardial patch was brought onto the field and cut lengthwise in half.    Patch angioplasty was performed of the profunda femoris artery first working distally to proximally.  Angioplasty was sewn to the open artery using continuous running suture of 5-0 Prolene on a C1 needle.  The patch was sewn across the apex of the common femoral endarterectomy and the free needle secured.  Patch angioplasty was performed of the superficial femoral artery next working distally to proximally.  Angioplasty was sewn to the open artery using continuous running suture of 5-0 Prolene on a C1 needle.  The patch was sewn laterally to the previously completed patch and the 2 ends of the suture were sewn together securing this connection.  The medial aspect of the patch was sewn using similar technique.  At the "crotch" of the femoral bifurcation the 2 patches were sewn together using continuous strand from the medial profunda suture and a long strand of the superficial femoral artery.  The 2 strands were met in the middle of the 2 patch  angioplasties and secured.   Clamps were released on the arteries and the patch angioplasty flushed and de-aired.  The repair was completed.  Hemostasis was achieved using several repair sutures.  A palpable pulse was felt in the distal superficial femoral artery and the profunda femoris artery.  Doppler flow was confirmed in the distal branches of the profunda femoris artery.  Stasis was achieved in the surgical bed.  The wound was closed in layers using 2-0 Vicryl, 3-0 Vicryl, 4-0 Monocryl.  Dermabond was applied the skin.  Upon completion of the case instrument and sharps counts were confirmed correct. The patient was transferred to the PACU in good condition. I was present for all portions of the procedure.  Yevonne Aline. Stanford Breed, MD [facility] Phone Number: [phone] 05/20/2020 11:06 AM

## 2020-05-20 NOTE — Anesthesia Procedure Notes (Signed)
Procedure Name: Intubation Date/Time: 05/20/2020 7:41 AM Performed by: Hoy Morn, CRNA Pre-anesthesia Checklist: Patient identified, Emergency Drugs available, Suction available and Patient being monitored Patient Re-evaluated:Patient Re-evaluated prior to induction Oxygen Delivery Method: Circle system utilized Preoxygenation: Pre-oxygenation with 100% oxygen Induction Type: IV induction Ventilation: Mask ventilation without difficulty Laryngoscope Size: Miller and 2 Grade View: Grade I Tube type: Oral Tube size: 7.0 mm Number of attempts: 1 Airway Equipment and Method: Stylet and Oral airway Placement Confirmation: ETT inserted through vocal cords under direct vision,  positive ETCO2 and breath sounds checked- equal and bilateral Secured at: 22 cm Tube secured with: Tape Dental Injury: Teeth and Oropharynx as per pre-operative assessment

## 2020-05-21 LAB — LIPID PANEL
Cholesterol: 143 mg/dL (ref 0–200)
HDL: 54 mg/dL (ref >40)
LDL Cholesterol: 73 mg/dL (ref 0–99)
Total CHOL/HDL Ratio: 2.6 RATIO
Triglycerides: 82 mg/dL (ref <150)
VLDL: 16 mg/dL (ref 0–40)

## 2020-05-21 LAB — BASIC METABOLIC PANEL
Anion gap: 9 (ref 5–15)
BUN: 34 mg/dL — ABNORMAL HIGH (ref 8–23)
CO2: 21 mmol/L — ABNORMAL LOW (ref 22–32)
Calcium: 8.8 mg/dL — ABNORMAL LOW (ref 8.9–10.3)
Chloride: 100 mmol/L (ref 98–111)
Creatinine, Ser: 2.35 mg/dL — ABNORMAL HIGH (ref 0.61–1.24)
GFR, Estimated: 29 mL/min — ABNORMAL LOW (ref >60)
Glucose, Bld: 205 mg/dL — ABNORMAL HIGH (ref 70–99)
Potassium: 4.9 mmol/L (ref 3.5–5.1)
Sodium: 130 mmol/L — ABNORMAL LOW (ref 135–145)

## 2020-05-21 LAB — CBC
HCT: 27.1 % — ABNORMAL LOW (ref 39.0–52.0)
Hemoglobin: 9 g/dL — ABNORMAL LOW (ref 13.0–17.0)
MCH: 29.9 pg (ref 26.0–34.0)
MCHC: 33.2 g/dL (ref 30.0–36.0)
MCV: 90 fL (ref 80.0–100.0)
Platelets: 249 10*3/uL (ref 150–400)
RBC: 3.01 MIL/uL — ABNORMAL LOW (ref 4.22–5.81)
RDW: 12.6 % (ref 11.5–15.5)
WBC: 11.7 10*3/uL — ABNORMAL HIGH (ref 4.0–10.5)
nRBC: 0 % (ref 0.0–0.2)

## 2020-05-21 MED ORDER — POLYETHYLENE GLYCOL 3350 17 G PO PACK
17.0000 g | PACK | Freq: Every day | ORAL | Status: DC
  Administered 2020-05-21 – 2020-05-22 (×2): 17 g via ORAL
  Filled 2020-05-21 (×2): qty 1

## 2020-05-21 NOTE — Progress Notes (Addendum)
[facility] [place]  Subjective  - Abdomin is distended and shooting pains.  He has passed flatus.  He uses Miralax at home occasionally for this a few times a month.  Right foot toes are sensitve.   Objective (!) 152/72 61 98.5 F (36.9 C) (Oral) 16 98%  Intake/Output Summary (Last 24 hours) at 05/21/2020 0755 Last data filed at 05/21/2020 0547 Gross per 24 hour  Intake 1183.07 ml  Output 735 ml  Net 448.07 ml    Doppler signal PT/DP/peroneal intact monophasic Right groin soft without hematoma Lungs non labored breathing Abdomin distended, + BS to auscultation  Assessment/Planning: POD # 1  1) right common femoral endarterectomy and bovine pericardial patch angioplasty 2) right superficial femoral endarterectomy and pericardial patch angioplasty 3) right extended profunda femoris endarterectomy (to second order branches) and bovine pericardial patch angioplasty  I ordered miralax  No HH recommended ambulating with stand by asst. Possible discharge tomorrow. Leukocytosis 11.7 HGB 9.0 surgical EBL 100 cc, asymptomatic blood loss anemia Likely discharge tomorrow pending pain control and mobility.    Roxy Horseman 05/21/2020 7:55 AM --  Biggest complaint is constipation Miralax given Encouraged pt to ambulate to improve bowel function Incision clean no hematoma Possible d/c am  Ruta Hinds, MD [facility] [place]: [phone]  Laboratory Lab Results: Recent Labs    05/20/20 0557 05/21/20 0140  WBC 7.2 11.7*  HGB 9.5* 9.0*  HCT 29.3* 27.1*  PLT 251 249   BMET Recent Labs    05/20/20 0557 05/21/20 0140  NA 132* 130*  K 4.5 4.9  CL 103 100  CO2 19* 21*  GLUCOSE 170* 205*  BUN 30* 34*  CREATININE 2.32* 2.35*  CALCIUM 9.2 8.8*    COAG Lab Results  Component Value Date   INR 1.2 05/20/2020   No results found for: PTT

## 2020-05-21 NOTE — Progress Notes (Signed)
Mobility Specialist - Progress Note   05/21/20 1310  Mobility  Activity Refused mobility   Pt refused d/t abd pain. Will f/u as able.   Pricilla Handler Mobility Specialist Mobility Specialist Phone: [phone]

## 2020-05-21 NOTE — Progress Notes (Signed)
Physical Therapy Treatment Patient Details Name: William Perkins MRN: [id number] DOB: [birth year] Today's Date: 05/21/2020    History of Present Illness 70 y.o. male with atherosclerosis of native arteries of right lower extremity causing ischemic rest pain.  PMH includes HTN, Hyperlipidemia, L-spine fusion in 2020.  Patient brought his back brace with him.    PT Comments    Pt received in supine, agreeable to therapy session with encouragement and with good participation and tolerance for mobility. He performed greater than household distance gait trial using RW and Supervision but did need occasional safety cues (tending to lift one arm off RW while walking) and HR elevated to 123 bpm but no c/o dizziness and BP stable after trial (140/72). Pt encouraged to perform LE exercises to warm up prior to OOB and ankle pumps t/o day. Pt would benefit from ambulation while hospitalized with mobility tech team, RN/mobility tech notified. Pt continues to benefit from PT services to progress toward functional mobility goals. Continue to recommend discharge home once medically cleared.  Follow Up Recommendations  No PT follow up     Equipment Recommendations  None recommended by PT    Recommendations for Other Services       Precautions / Restrictions Precautions Precautions: Fall Precaution Comments: chronic back pain Restrictions Weight Bearing Restrictions: No    Mobility  Bed Mobility Overal bed mobility: Needs Assistance Bed Mobility: Supine to Sit;Sit to Supine     Supine to sit: Supervision;HOB elevated     General bed mobility comments: use of bed features/HOB elevated    Transfers Overall transfer level: Needs assistance Equipment used: Rolling walker (2 wheeled) Transfers: Sit to/from Stand Sit to Stand: Min guard         General transfer comment: cues for hand placement and staggering RLE for reduced pain  Ambulation/Gait Ambulation/Gait assistance:  Supervision Gait Distance (Feet): 330 Feet Assistive device: Rolling walker (2 wheeled) Gait Pattern/deviations: Step-through pattern;Decreased step length - left;Decreased stance time - right   Gait velocity interpretation: 1.31 - 2.62 ft/sec, indicative of limited community ambulator General Gait Details: steady and mildly antalgic, improved gait quality as he progressed; HR elevated 100-123 bpm during mobility; brief standing break only, no LOB   Stairs Stairs:  (pt reports no STE and does not want to practice them)           Wheelchair Mobility    Modified Rankin (Stroke Patients Only)       Balance Overall balance assessment: Needs assistance Sitting-balance support: Feet supported Sitting balance-Leahy Scale: Good       Standing balance-Leahy Scale: Poor Standing balance comment: able to stand with UUE support during dynamic tasks briefly but encouraged to use RW due to RLE pain/instability                            Cognition Arousal/Alertness: Awake/alert Behavior During Therapy: WFL for tasks assessed/performed Overall Cognitive Status: Within Functional Limits for tasks assessed                                        Exercises Other Exercises Other Exercises: verbal review for RLE therex and given HEP handout to perform 2-3x/day, reinforced he needs supervision for standing therex    General Comments General comments (skin integrity, edema, etc.): HR 80's-90's resting and HR max 123 bpm during gait per tele  Pertinent Vitals/Pain Pain Assessment: Faces Faces Pain Scale: Hurts a little bit Pain Location: R LE Pain Descriptors / Indicators: Tender;Guarding Pain Intervention(s): Monitored during session;Premedicated before session;Repositioned    Home Living                      Prior Function            PT Goals (current goals can now be found in the care plan section) Acute Rehab PT Goals Patient Stated  Goal: Hoping to be able to walk without the RW and pain. PT Goal Formulation: With patient Time For Goal Achievement: 05/27/20 Potential to Achieve Goals: Good Progress towards PT goals: Progressing toward goals    Frequency    Min 3X/week      PT Plan Current plan remains appropriate    Co-evaluation              AM-PAC PT "6 Clicks" Mobility   Outcome Measure  Help needed turning from your back to your side while in a flat bed without using bedrails?: None Help needed moving from lying on your back to sitting on the side of a flat bed without using bedrails?: None Help needed moving to and from a bed to a chair (including a wheelchair)?: A Little Help needed standing up from a chair using your arms (e.g., wheelchair or bedside chair)?: A Little Help needed to walk in hospital room?: A Little Help needed climbing 3-5 steps with a railing? : A Little 6 Click Score: 20    End of Session Equipment Utilized During Treatment: Gait belt Activity Tolerance: Patient tolerated treatment well Patient left: Other (comment);with call bell/phone within reach (on toilet, NT notified) Nurse Communication: Mobility status PT Visit Diagnosis: Other abnormalities of gait and mobility (R26.89);Difficulty in walking, not elsewhere classified (R26.2)     Time: 5597-4163 PT Time Calculation (min) (ACUTE ONLY): 13 min  Charges:  $Gait Training: 8-22 mins                     Johncarlo Maalouf P., PTA Acute Rehabilitation Services Pager: [phone] Office: [place] 05/21/2020, 11:48 AM

## 2020-05-22 MED ORDER — OXYCODONE-ACETAMINOPHEN 5-325 MG PO TABS: 1.0000 | ORAL_TABLET | Freq: Four times a day (QID) | ORAL | 0 refills | Status: DC | PRN

## 2020-05-22 NOTE — Progress Notes (Addendum)
[facility] feels much better, he had a BM last night.  Ambulating and pain well controlled.   Objective 132/70 63 98.4 F (36.9 C) (Oral) 17 98%  Intake/Output Summary (Last 24 hours) at 05/22/2020 0751 Last data filed at 05/21/2020 0800 Gross per 24 hour  Intake 120 ml  Output --  Net 120 ml    Doppler signals DP/PT/Peroneal right LE Right groin  Incision healing well without hematoma Lungs non labored breathing   Assessment/Planning: POD # 2  1)right common femoral endarterectomy andbovine pericardialpatch angioplasty 2)right superficial femoral endarterectomy andpericardialpatch angioplasty 3)rightextendedprofunda femoris endarterectomy (to second order branches)andbovine pericardialpatch angioplasty  Ambulated, has walker at home Afebrile, + BM Plan discharge home today F/U in office in 2-3 weeks with ABI  Roxy Horseman 05/22/2020 7:51 AM -- Agree with above DC home  Ruta Hinds, MD [facility] Office: [phone]  Laboratory Lab Results: Recent Labs    05/20/20 0557 05/21/20 0140  WBC 7.2 11.7*  HGB 9.5* 9.0*  HCT 29.3* 27.1*  PLT 251 249   BMET Recent Labs    05/20/20 0557 05/21/20 0140  NA 132* 130*  K 4.5 4.9  CL 103 100  CO2 19* 21*  GLUCOSE 170* 205*  BUN 30* 34*  CREATININE 2.32* 2.35*  CALCIUM 9.2 8.8*    COAG Lab Results  Component Value Date   INR 1.2 05/20/2020   No results found for: PTT

## 2020-05-22 NOTE — Discharge Instructions (Signed)
 [facility]  Discharge instructions  Lower Extremity Bypass Surgery  Please refer to the following instruction for your post-procedure care. Your surgeon or physician assistant will discuss any changes with you.  Activity  You are encouraged to walk as much as you can. You can slowly return to normal activities during the month after your surgery. Avoid strenuous activity and heavy lifting until your doctor tells you it's OK. Avoid activities such as vacuuming or swinging a golf club. Do not drive until your doctor give the OK and you are no longer taking prescription pain medications. It is also normal to have difficulty with sleep habits, eating and bowel movement after surgery. These will go away with time.  Bathing/Showering  You may shower after you go home. Do not soak in a bathtub, hot tub, or swim until the incision heals completely.  Incision Care  Clean your incision with mild soap and water. Shower every day. Pat the area dry with a clean towel. You do not need a bandage unless otherwise instructed. Do not apply any ointments or creams to your incision. If you have open wounds you will be instructed how to care for them or a visiting nurse may be arranged for you. If you have staples or sutures along your incision they will be removed at your post-op appointment. You may have skin glue on your incision. Do not peel it off. It will come off on its own in about one week. If you have a great deal of moisture in your groin, use a gauze help keep this area dry.  Diet  Resume your normal diet. There are no special food restrictions following this procedure. A low fat/ low cholesterol diet is recommended for all patients with vascular disease. In order to heal from your surgery, it is CRITICAL to get adequate nutrition. Your body requires vitamins, minerals, and protein. Vegetables are the best source of vitamins and minerals. Vegetables also provide the  perfect balance of protein. Processed food has little nutritional value, so try to avoid this.  Medications  Resume taking all your medications unless your doctor or nurse practitioner tells you not to. If your incision is causing pain, you may take over-the-counter pain relievers such as acetaminophen (Tylenol). If you were prescribed a stronger pain medication, please aware these medication can cause nausea and constipation. Prevent nausea by taking the medication with a snack or meal. Avoid constipation by drinking plenty of fluids and eating foods with high amount of fiber, such as fruits, vegetables, and grains. Take Colase 100 mg (an over-the-counter stool softener) twice a day as needed for constipation. Do not take Tylenol if you are taking prescription pain medications.  Follow Up  Our office will schedule a follow up appointment 2-3 weeks following discharge.  Please call us immediately for any of the following conditions  Severe or worsening pain in your legs or feet while at rest or while walking Increase pain, redness, warmth, or drainage (pus) from your incision site(s) Fever of 101 degree or higher The swelling in your leg with the bypass suddenly worsens and becomes more painful than when you were in the hospital If you have been instructed to feel your graft pulse then you should do so every day. If you can no longer feel this pulse, call the office immediately. Not all patients are given this instruction.  Leg swelling is common after leg bypass surgery.  The swelling should improve over a few months   following surgery. To improve the swelling, you may elevate your legs above the level of your heart while you are sitting or resting. Your surgeon or physician assistant may ask you to apply an ACE wrap or wear compression (TED) stockings to help to reduce swelling.  Reduce your risk of vascular disease  Stop smoking. If you would like help call [facility] at 1-800-QUIT-NOW  (1-800-784-8669) or [place] [place] at 336-586-4000.  Manage your cholesterol Maintain a desired weight Control your diabetes weight Control your diabetes Keep your blood pressure down  If you have any questions, please call the office at [phone]   

## 2020-05-23 ENCOUNTER — Encounter (HOSPITAL_COMMUNITY): Payer: Self-pay | Admitting: Vascular Surgery

## 2020-05-23 DIAGNOSIS — Z48812 Encounter for surgical aftercare following surgery on the circulatory system: Secondary | ICD-10-CM | POA: Diagnosis not present

## 2020-05-23 DIAGNOSIS — I70201 Unspecified atherosclerosis of native arteries of extremities, right leg: Secondary | ICD-10-CM | POA: Diagnosis not present

## 2020-05-23 DIAGNOSIS — N181 Chronic kidney disease, stage 1: Secondary | ICD-10-CM | POA: Diagnosis not present

## 2020-05-23 DIAGNOSIS — E785 Hyperlipidemia, unspecified: Secondary | ICD-10-CM | POA: Diagnosis not present

## 2020-05-23 DIAGNOSIS — Z87891 Personal history of nicotine dependence: Secondary | ICD-10-CM | POA: Diagnosis not present

## 2020-05-23 DIAGNOSIS — Z9862 Peripheral vascular angioplasty status: Secondary | ICD-10-CM | POA: Diagnosis not present

## 2020-05-23 DIAGNOSIS — I10 Essential (primary) hypertension: Secondary | ICD-10-CM | POA: Diagnosis not present

## 2020-05-23 NOTE — Discharge Summary (Signed)
[facility] Discharge Summary   Patient ID:  William Perkins MRN: [id number] DOB/AGE: 1951-01-09 70 y.o.  Admit date: 05/20/2020 Discharge date: 05/22/20 Date of Surgery: 05/20/2020 Surgeon: Surgeon(s): Cherre Robins, MD  Admission Diagnosis: Rest pain of lower extremity due to atherosclerosis [facility]) [I70.229]  Discharge Diagnoses:  Rest pain of lower extremity due to atherosclerosis [facility]) [I70.229]  Secondary Diagnoses: Past Medical History:  Diagnosis Date  . Chronic renal insufficiency 03/21/2006   CrCl <50 ml  . DDD (degenerative disc disease), cervical   . GERD (gastroesophageal reflux disease) 03/22/2006  . Hand numbness 10/07/2009  . Hyperlipidemia 03/22/2008  . Hypertension   . Hypertension 03/22/2006  . Left ventricular hypertrophy 03/22/2006  . Leg pain, left 11/15/2009  . Lumbar strain 07/15/2008  . Renal insufficiency   . Soft tissue disorder 08/2008  . Tobacco abuse 03/22/2006   2 ppd x 30 years    Procedure(s): RIGHT EXTENDED FEMORAL ENDARTERECTOMY AND PROFUNDAPLASTY  Discharged Condition: stable  HPI: William Perkins is a 70 y.o. male with ischemic rest pain of the right leg.  He underwent an angiogram of the right lower extremity which revealed near occlusive plaque of the right proximal profunda femoris artery and right superficial femoral artery.   Hospital Course:  William Perkins is a 70 y.o. male is S/P  Procedure(s): RIGHT EXTENDED FEMORAL ENDARTERECTOMY AND PROFUNDAPLASTY Doppler signal PT/DP/peroneal intact monophasic, Right groin soft without hematoma.  He reported abdominal distention that has been an on/off chronic constipation issue.  He was given miralax.  He was discharged home POD # 2 ins stable condition.     Significant Diagnostic Studies: CBC Lab Results  Component Value Date   WBC 11.7 (H) 05/21/2020   HGB 9.0 (L) 05/21/2020   HCT 27.1 (L) 05/21/2020   MCV 90.0 05/21/2020   PLT 249 05/21/2020    BMET     Component Value Date/Time   NA 130 (L) 05/21/2020 0140   NA 137 04/07/2020 1409   K 4.9 05/21/2020 0140   CL 100 05/21/2020 0140   CO2 21 (L) 05/21/2020 0140   GLUCOSE 205 (H) 05/21/2020 0140   BUN 34 (H) 05/21/2020 0140   BUN 31 (H) 04/07/2020 1409   CREATININE 2.35 (H) 05/21/2020 0140   CREATININE 1.78 (H) 06/24/2014 1640   CALCIUM 8.8 (L) 05/21/2020 0140   GFRNONAA 29 (L) 05/21/2020 0140   GFRNONAA 43 (L) 01/07/2014 1353   GFRAA 43 (L) 04/07/2020 1409   GFRAA 50 (L) 01/07/2014 1353   COAG Lab Results  Component Value Date   INR 1.2 05/20/2020     Disposition:  Discharge to :Home Discharge Instructions    Call MD for:  redness, tenderness, or signs of infection (pain, swelling, bleeding, redness, odor or green/yellow discharge around incision site)   Complete by: As directed    Call MD for:  severe or increased pain, loss or decreased feeling  in affected limb(s)   Complete by: As directed    Call MD for:  temperature >100.5   Complete by: As directed    Resume previous diet   Complete by: As directed      Allergies as of 05/22/2020   No Known Allergies     Medication List    TAKE these medications   acetaminophen 500 MG tablet Commonly known as: TYLENOL Take 1,000 mg by mouth every 6 (six) hours as needed for mild pain or moderate pain.   amLODipine 10 MG tablet Commonly known  as: NORVASC Take 1 tablet (10 mg total) by mouth daily.   aspirin EC 81 MG tablet Take 1 tablet (81 mg total) by mouth daily. Swallow whole.   cloNIDine 0.2 MG tablet Commonly known as: Catapres Take 1 tablet (0.2 mg total) by mouth 2 (two) times daily.   hydrALAZINE 100 MG tablet Commonly known as: APRESOLINE Take 1 tablet (100 mg total) by mouth 2 (two) times daily.   Icy Hot 7.5 % (Roll) Misc Generic drug: Menthol (Topical Analgesic) Apply 1 each topically daily as needed (pain).   lisinopril 40 MG tablet Commonly known as: ZESTRIL Take 1 tablet (40 mg total) by mouth  daily.   oxyCODONE-acetaminophen 5-325 MG tablet Commonly known as: PERCOCET/ROXICET Take 1 tablet by mouth every 6 (six) hours as needed for severe pain. What changed: when to take this   polyethylene glycol 17 g packet Commonly known as: MIRALAX / GLYCOLAX Take 17 g by mouth 2 (two) times daily. What changed:   when to take this  reasons to take this   pravastatin 40 MG tablet Commonly known as: PRAVACHOL Take 1 tablet (40 mg total) by mouth every evening.   spironolactone 25 MG tablet Commonly known as: Aldactone Take 1 tablet (25 mg total) by mouth daily.      Verbal and written Discharge instructions given to the patient. Wound care per Discharge AVS  Follow-up Information    Cherre Robins, MD Follow up in 3 week(s).   Specialties: Vascular Surgery, Interventional Cardiology Contact information: [place] [address] [phone]               Signed: Roxy Horseman 05/23/2020, 1:23 PM

## 2020-05-25 DIAGNOSIS — Z87891 Personal history of nicotine dependence: Secondary | ICD-10-CM | POA: Diagnosis not present

## 2020-05-25 DIAGNOSIS — E785 Hyperlipidemia, unspecified: Secondary | ICD-10-CM | POA: Diagnosis not present

## 2020-05-25 DIAGNOSIS — Z9862 Peripheral vascular angioplasty status: Secondary | ICD-10-CM | POA: Diagnosis not present

## 2020-05-25 DIAGNOSIS — N181 Chronic kidney disease, stage 1: Secondary | ICD-10-CM | POA: Diagnosis not present

## 2020-05-25 DIAGNOSIS — I70201 Unspecified atherosclerosis of native arteries of extremities, right leg: Secondary | ICD-10-CM | POA: Diagnosis not present

## 2020-05-25 DIAGNOSIS — I10 Essential (primary) hypertension: Secondary | ICD-10-CM | POA: Diagnosis not present

## 2020-05-25 DIAGNOSIS — Z48812 Encounter for surgical aftercare following surgery on the circulatory system: Secondary | ICD-10-CM | POA: Diagnosis not present

## 2020-05-27 DIAGNOSIS — I70201 Unspecified atherosclerosis of native arteries of extremities, right leg: Secondary | ICD-10-CM | POA: Diagnosis not present

## 2020-05-27 DIAGNOSIS — Z9862 Peripheral vascular angioplasty status: Secondary | ICD-10-CM | POA: Diagnosis not present

## 2020-05-27 DIAGNOSIS — Z48812 Encounter for surgical aftercare following surgery on the circulatory system: Secondary | ICD-10-CM | POA: Diagnosis not present

## 2020-05-27 DIAGNOSIS — E785 Hyperlipidemia, unspecified: Secondary | ICD-10-CM | POA: Diagnosis not present

## 2020-05-27 DIAGNOSIS — I10 Essential (primary) hypertension: Secondary | ICD-10-CM | POA: Diagnosis not present

## 2020-05-27 DIAGNOSIS — Z87891 Personal history of nicotine dependence: Secondary | ICD-10-CM | POA: Diagnosis not present

## 2020-05-27 DIAGNOSIS — N181 Chronic kidney disease, stage 1: Secondary | ICD-10-CM | POA: Diagnosis not present

## 2020-05-30 DIAGNOSIS — N183 Chronic kidney disease, stage 3 unspecified: Secondary | ICD-10-CM | POA: Diagnosis not present

## 2020-05-31 DIAGNOSIS — I10 Essential (primary) hypertension: Secondary | ICD-10-CM | POA: Diagnosis not present

## 2020-05-31 DIAGNOSIS — Z48812 Encounter for surgical aftercare following surgery on the circulatory system: Secondary | ICD-10-CM | POA: Diagnosis not present

## 2020-05-31 DIAGNOSIS — Z87891 Personal history of nicotine dependence: Secondary | ICD-10-CM | POA: Diagnosis not present

## 2020-05-31 DIAGNOSIS — N181 Chronic kidney disease, stage 1: Secondary | ICD-10-CM | POA: Diagnosis not present

## 2020-05-31 DIAGNOSIS — I70201 Unspecified atherosclerosis of native arteries of extremities, right leg: Secondary | ICD-10-CM | POA: Diagnosis not present

## 2020-05-31 DIAGNOSIS — Z9862 Peripheral vascular angioplasty status: Secondary | ICD-10-CM | POA: Diagnosis not present

## 2020-05-31 DIAGNOSIS — E785 Hyperlipidemia, unspecified: Secondary | ICD-10-CM | POA: Diagnosis not present

## 2020-06-02 DIAGNOSIS — I10 Essential (primary) hypertension: Secondary | ICD-10-CM | POA: Diagnosis not present

## 2020-06-02 DIAGNOSIS — Z87891 Personal history of nicotine dependence: Secondary | ICD-10-CM | POA: Diagnosis not present

## 2020-06-02 DIAGNOSIS — E785 Hyperlipidemia, unspecified: Secondary | ICD-10-CM | POA: Diagnosis not present

## 2020-06-02 DIAGNOSIS — N181 Chronic kidney disease, stage 1: Secondary | ICD-10-CM | POA: Diagnosis not present

## 2020-06-02 DIAGNOSIS — I70201 Unspecified atherosclerosis of native arteries of extremities, right leg: Secondary | ICD-10-CM | POA: Diagnosis not present

## 2020-06-02 DIAGNOSIS — Z9862 Peripheral vascular angioplasty status: Secondary | ICD-10-CM | POA: Diagnosis not present

## 2020-06-02 DIAGNOSIS — Z48812 Encounter for surgical aftercare following surgery on the circulatory system: Secondary | ICD-10-CM | POA: Diagnosis not present

## 2020-06-04 ENCOUNTER — Other Ambulatory Visit: Payer: Self-pay

## 2020-06-04 DIAGNOSIS — I70221 Atherosclerosis of native arteries of extremities with rest pain, right leg: Secondary | ICD-10-CM

## 2020-06-06 DIAGNOSIS — E559 Vitamin D deficiency, unspecified: Secondary | ICD-10-CM | POA: Diagnosis not present

## 2020-06-06 DIAGNOSIS — N183 Chronic kidney disease, stage 3 unspecified: Secondary | ICD-10-CM | POA: Diagnosis not present

## 2020-06-06 DIAGNOSIS — I129 Hypertensive chronic kidney disease with stage 1 through stage 4 chronic kidney disease, or unspecified chronic kidney disease: Secondary | ICD-10-CM | POA: Diagnosis not present

## 2020-06-06 DIAGNOSIS — D631 Anemia in chronic kidney disease: Secondary | ICD-10-CM | POA: Diagnosis not present

## 2020-06-07 DIAGNOSIS — Z87891 Personal history of nicotine dependence: Secondary | ICD-10-CM | POA: Diagnosis not present

## 2020-06-07 DIAGNOSIS — N181 Chronic kidney disease, stage 1: Secondary | ICD-10-CM | POA: Diagnosis not present

## 2020-06-07 DIAGNOSIS — E785 Hyperlipidemia, unspecified: Secondary | ICD-10-CM | POA: Diagnosis not present

## 2020-06-07 DIAGNOSIS — I10 Essential (primary) hypertension: Secondary | ICD-10-CM | POA: Diagnosis not present

## 2020-06-07 DIAGNOSIS — Z9862 Peripheral vascular angioplasty status: Secondary | ICD-10-CM | POA: Diagnosis not present

## 2020-06-07 DIAGNOSIS — I70201 Unspecified atherosclerosis of native arteries of extremities, right leg: Secondary | ICD-10-CM | POA: Diagnosis not present

## 2020-06-07 DIAGNOSIS — Z48812 Encounter for surgical aftercare following surgery on the circulatory system: Secondary | ICD-10-CM | POA: Diagnosis not present

## 2020-06-09 DIAGNOSIS — Z9862 Peripheral vascular angioplasty status: Secondary | ICD-10-CM | POA: Diagnosis not present

## 2020-06-09 DIAGNOSIS — Z87891 Personal history of nicotine dependence: Secondary | ICD-10-CM | POA: Diagnosis not present

## 2020-06-09 DIAGNOSIS — I70201 Unspecified atherosclerosis of native arteries of extremities, right leg: Secondary | ICD-10-CM | POA: Diagnosis not present

## 2020-06-09 DIAGNOSIS — N181 Chronic kidney disease, stage 1: Secondary | ICD-10-CM | POA: Diagnosis not present

## 2020-06-09 DIAGNOSIS — E785 Hyperlipidemia, unspecified: Secondary | ICD-10-CM | POA: Diagnosis not present

## 2020-06-09 DIAGNOSIS — I10 Essential (primary) hypertension: Secondary | ICD-10-CM | POA: Diagnosis not present

## 2020-06-09 DIAGNOSIS — Z48812 Encounter for surgical aftercare following surgery on the circulatory system: Secondary | ICD-10-CM | POA: Diagnosis not present

## 2020-06-13 DIAGNOSIS — I129 Hypertensive chronic kidney disease with stage 1 through stage 4 chronic kidney disease, or unspecified chronic kidney disease: Secondary | ICD-10-CM | POA: Diagnosis not present

## 2020-06-16 ENCOUNTER — Ambulatory Visit (HOSPITAL_COMMUNITY)
Admission: RE | Admit: 2020-06-16 | Discharge: 2020-06-16 | Disposition: A | Discharge status: Home / Self Care | Payer: Medicare Other | Source: Ambulatory Visit | Attending: Vascular Surgery | Admitting: Vascular Surgery

## 2020-06-16 ENCOUNTER — Ambulatory Visit (INDEPENDENT_AMBULATORY_CARE_PROVIDER_SITE_OTHER): Payer: Medicare Other | Admitting: Physician Assistant

## 2020-06-16 ENCOUNTER — Other Ambulatory Visit: Payer: Self-pay

## 2020-06-16 VITALS — BP 116/66 | HR 54 | Resp 16 | Ht 71.0 in | Wt 141.0 lb

## 2020-06-16 DIAGNOSIS — I70221 Atherosclerosis of native arteries of extremities with rest pain, right leg: Secondary | ICD-10-CM | POA: Insufficient documentation

## 2020-06-16 NOTE — Progress Notes (Signed)
    Postoperative Visit   History of Present Illness   TESLA BOCHICCHIO is a 70 y.o. year old male who presents for postoperative follow-up after extensive right iliofemoral endarterectomy with bovine pericardial patch angioplasty by Dr. Stanford Breed on 05/20/2020 due to ischemic rest pain.  Patient states rest pain symptoms have resolved.  Right groin incision is well-healed.  He is ambulating without difficulty.   For VQI Use Only   PRE-ADM LIVING: Home  AMB STATUS: Ambulatory   Physical Examination   Vitals:   06/16/20 1430  BP: 116/66  Pulse: (!) 54  Resp: 16  SpO2: 99%  Weight: 141 lb (64 kg)  Height: 5\' 11"  (1.803 m)    RLE: Incision is healed    Medical Decision Making   KRISHAY FARO is a 70 y.o. year old male who presents s/p right iliofemoral endarterectomy with bovine pericardial patch angioplasty.  Marland Kitchen Rest pain of right lower extremity has resolved and foot is well-perfused . Right groin incision completely healed . Encouraged ambulation . Recheck ABIs in 6 months   Dagoberto Ligas PA-C [facility] Office: [phone]  Clinic MD: Scot Dock

## 2020-06-21 ENCOUNTER — Other Ambulatory Visit: Payer: Self-pay

## 2020-06-21 DIAGNOSIS — I70221 Atherosclerosis of native arteries of extremities with rest pain, right leg: Secondary | ICD-10-CM

## 2020-07-28 ENCOUNTER — Other Ambulatory Visit: Payer: Self-pay | Admitting: Internal Medicine

## 2020-07-28 DIAGNOSIS — E782 Mixed hyperlipidemia: Secondary | ICD-10-CM

## 2020-08-08 DIAGNOSIS — N183 Chronic kidney disease, stage 3 unspecified: Secondary | ICD-10-CM | POA: Diagnosis not present

## 2020-08-09 DIAGNOSIS — N183 Chronic kidney disease, stage 3 unspecified: Secondary | ICD-10-CM | POA: Diagnosis not present

## 2020-08-24 DIAGNOSIS — I129 Hypertensive chronic kidney disease with stage 1 through stage 4 chronic kidney disease, or unspecified chronic kidney disease: Secondary | ICD-10-CM | POA: Diagnosis not present

## 2020-08-24 DIAGNOSIS — D631 Anemia in chronic kidney disease: Secondary | ICD-10-CM | POA: Diagnosis not present

## 2020-08-24 DIAGNOSIS — N1832 Chronic kidney disease, stage 3b: Secondary | ICD-10-CM | POA: Diagnosis not present

## 2020-08-24 DIAGNOSIS — E559 Vitamin D deficiency, unspecified: Secondary | ICD-10-CM | POA: Diagnosis not present

## 2020-08-25 ENCOUNTER — Other Ambulatory Visit: Payer: Self-pay | Admitting: Internal Medicine

## 2020-08-25 DIAGNOSIS — I10 Essential (primary) hypertension: Secondary | ICD-10-CM

## 2020-08-25 MED ORDER — SPIRONOLACTONE 25 MG PO TABS: 25.0000 mg | ORAL_TABLET | Freq: Every day | ORAL | 2 refills | Status: DC

## 2020-08-25 NOTE — Telephone Encounter (Signed)
Refill Request- Pt states he called the pharmacy last week and today and was told their pharmacy has been sending requests with no response.  The patient is asking for a nurse to call him back about why he has not rec'd the following medication.   spironolactone (ALDACTONE) 25 MG tablet (Expired)   [phone] - [place], [place] AT [address] (Ph: [phone])

## 2020-08-25 NOTE — Telephone Encounter (Signed)
Left detailed message on patient's self-identified VM that Rx was sent to [facility] at 1045 this AM.

## 2020-09-06 ENCOUNTER — Encounter: Payer: Self-pay | Admitting: *Deleted

## 2020-10-27 ENCOUNTER — Other Ambulatory Visit: Payer: Self-pay | Admitting: *Deleted

## 2020-10-27 DIAGNOSIS — E782 Mixed hyperlipidemia: Secondary | ICD-10-CM

## 2020-10-27 MED ORDER — PRAVASTATIN SODIUM 40 MG PO TABS
ORAL_TABLET | ORAL | 0 refills | Status: DC
Start: 2020-10-27 — End: 2021-01-31

## 2020-11-08 ENCOUNTER — Other Ambulatory Visit: Payer: Self-pay | Admitting: *Deleted

## 2020-11-08 DIAGNOSIS — I10 Essential (primary) hypertension: Secondary | ICD-10-CM

## 2020-11-08 MED ORDER — LISINOPRIL 40 MG PO TABS: 40.0000 mg | ORAL_TABLET | Freq: Every day | ORAL | 11 refills | Status: DC

## 2020-11-08 MED ORDER — AMLODIPINE BESYLATE 10 MG PO TABS: 10.0000 mg | ORAL_TABLET | Freq: Every day | ORAL | 11 refills | Status: DC

## 2020-11-21 ENCOUNTER — Other Ambulatory Visit: Payer: Self-pay

## 2020-11-21 DIAGNOSIS — I10 Essential (primary) hypertension: Secondary | ICD-10-CM

## 2020-11-22 MED ORDER — SPIRONOLACTONE 25 MG PO TABS
25.0000 mg | ORAL_TABLET | Freq: Every day | ORAL | 2 refills | Status: DC
Start: 2020-11-22 — End: 2021-03-09

## 2020-11-28 ENCOUNTER — Telehealth: Payer: Self-pay | Admitting: Internal Medicine

## 2020-11-28 NOTE — Telephone Encounter (Signed)
I called [facility] who stated pt had already called them this morning and pt was told it's ready. Stated is on his way there.

## 2020-11-28 NOTE — Telephone Encounter (Signed)
Refill Request-  PT states he contacted his pharmacy and they told him his prescription was not sent in.   spironolactone (ALDACTONE) 25 MG tablet [[id number]   [id number] - [place], [address] AT [address] (Ph: [phone])

## 2021-01-09 DIAGNOSIS — N1832 Chronic kidney disease, stage 3b: Secondary | ICD-10-CM | POA: Diagnosis not present

## 2021-01-18 ENCOUNTER — Encounter: Payer: Medicare Other | Admitting: Internal Medicine

## 2021-01-18 DIAGNOSIS — E559 Vitamin D deficiency, unspecified: Secondary | ICD-10-CM | POA: Diagnosis not present

## 2021-01-18 DIAGNOSIS — I129 Hypertensive chronic kidney disease with stage 1 through stage 4 chronic kidney disease, or unspecified chronic kidney disease: Secondary | ICD-10-CM | POA: Diagnosis not present

## 2021-01-18 DIAGNOSIS — D631 Anemia in chronic kidney disease: Secondary | ICD-10-CM | POA: Diagnosis not present

## 2021-01-18 DIAGNOSIS — N1832 Chronic kidney disease, stage 3b: Secondary | ICD-10-CM | POA: Diagnosis not present

## 2021-01-31 ENCOUNTER — Other Ambulatory Visit: Payer: Self-pay

## 2021-01-31 ENCOUNTER — Ambulatory Visit: Payer: Medicare Other | Admitting: Physician Assistant

## 2021-01-31 ENCOUNTER — Ambulatory Visit (HOSPITAL_COMMUNITY)
Admission: RE | Admit: 2021-01-31 | Discharge: 2021-01-31 | Disposition: A | Discharge status: Home / Self Care | Payer: Medicare Other | Source: Ambulatory Visit | Attending: Vascular Surgery | Admitting: Vascular Surgery

## 2021-01-31 VITALS — BP 102/60 | HR 50 | Temp 98.2°F | Resp 20 | Ht 71.0 in | Wt 139.7 lb

## 2021-01-31 DIAGNOSIS — I70221 Atherosclerosis of native arteries of extremities with rest pain, right leg: Secondary | ICD-10-CM | POA: Diagnosis not present

## 2021-01-31 DIAGNOSIS — E782 Mixed hyperlipidemia: Secondary | ICD-10-CM

## 2021-01-31 DIAGNOSIS — I739 Peripheral vascular disease, unspecified: Secondary | ICD-10-CM | POA: Diagnosis not present

## 2021-01-31 NOTE — Progress Notes (Signed)
HISTORY AND PHYSICAL     CC:  follow up. Requesting Provider:  Marianna Payment, MD  HPI: This is a 70 y.o. male who is here today for follow up for PAD.  He had extensive right iliofemoral endarterectomy with bovine pericardial patch angioplasty by Dr. Stanford Breed on 05/20/2020 due to ischemic rest pain.   Pt was last seen April 2022 and at that time, he was doing well as his rest pain had resolved and he was not having any issues.   The pt returns today for follow up.  He states that he is doing well and denies any claudication, rest pain or non healing wounds.  He is compliant with his asa and statin.   The pt is on a statin for cholesterol management.    The pt is on an aspirin.    Other AC:  none The pt is on CCB, ACEI, diuretic, clonidine for hypertension.  The pt does not have diabetes. Tobacco hx:  former  Pt does not have family hx of AAA.  Past Medical History:  Diagnosis Date   Chronic renal insufficiency 03/21/2006   CrCl <50 ml   DDD (degenerative disc disease), cervical    GERD (gastroesophageal reflux disease) 03/22/2006   Hand numbness 10/07/2009   Hyperlipidemia 03/22/2008   Hypertension    Hypertension 03/22/2006   Left ventricular hypertrophy 03/22/2006   Leg pain, left 11/15/2009   Lumbar strain 07/15/2008   Renal insufficiency    Soft tissue disorder 08/2008   Tobacco abuse 03/22/2006   2 ppd x 30 years    Past Surgical History:  Procedure Laterality Date   ABDOMINAL AORTOGRAM W/LOWER EXTREMITY N/A 05/18/2020   Procedure: ABDOMINAL AORTOGRAM W/LOWER EXTREMITY;  Surgeon: Cherre Robins, MD;  Location: [facility];  Service: Cardiovascular;  Laterality: N/A;   COLONOSCOPY     ENDARTERECTOMY FEMORAL Right 05/20/2020   Procedure: RIGHT EXTENDED FEMORAL ENDARTERECTOMY AND PROFUNDAPLASTY;  Surgeon: Cherre Robins, MD;  Location: [facility];  Service: Vascular;  Laterality: Right;   WISDOM TOOTH EXTRACTION      No Known Allergies  Current Outpatient  Medications  Medication Sig Dispense Refill   acetaminophen (TYLENOL) 500 MG tablet Take 1,000 mg by mouth every 6 (six) hours as needed for mild pain or moderate pain.     amLODipine (NORVASC) 10 MG tablet Take 1 tablet (10 mg total) by mouth daily. 30 tablet 11   aspirin EC 81 MG tablet Take 81 mg by mouth daily. Swallow whole.     cloNIDine (CATAPRES) 0.2 MG tablet Take 1 tablet (0.2 mg total) by mouth 2 (two) times daily. 60 tablet 11   hydrALAZINE (APRESOLINE) 100 MG tablet Take 1 tablet (100 mg total) by mouth 2 (two) times daily. 90 tablet 3   lisinopril (ZESTRIL) 40 MG tablet Take 1 tablet (40 mg total) by mouth daily. 30 tablet 11   LOKELMA 5 g packet      Menthol, Topical Analgesic, (ICY HOT) 7.5 % (Roll) MISC Apply 1 each topically daily as needed (pain).     polyethylene glycol (MIRALAX / GLYCOLAX) 17 g packet Take 17 g by mouth 2 (two) times daily. (Patient taking differently: Take 17 g by mouth daily as needed for mild constipation or moderate constipation.) 14 each 25   pravastatin (PRAVACHOL) 40 MG tablet TAKE 1 TABLET(40 MG) BY MOUTH EVERY EVENING 90 tablet 0   spironolactone (ALDACTONE) 25 MG tablet Take 1 tablet (25 mg total) by mouth daily. 30 tablet  2   No current facility-administered medications for this visit.    Family History  Problem Relation Age of Onset   Diabetes Mother    Diabetes Sister    Diabetes Brother    Heart disease Brother    Rectal cancer Neg Hx    Stomach cancer Neg Hx    Colon cancer Neg Hx     Social History   Socioeconomic History   Marital status: Married    Spouse name: Not on file   Number of children: Not on file   Years of education: 12   Highest education level: Not on file  Occupational History   Not on file  Tobacco Use   Smoking status: Former    Packs/day: 1.00    Years: 44.00    Pack years: 44.00    Types: Cigarettes    Quit date: 11/21/2013    Years since quitting: 7.2   Smokeless tobacco: Never   Tobacco comments:     Stopped 11/21/2013   Vaping Use   Vaping Use: Never used  Substance and Sexual Activity   Alcohol use: No    Alcohol/week: 0.0 standard drinks    Comment: Quit 2015   Drug use: No   Sexual activity: Yes    Partners: Female  Other Topics Concern   Not on file  Social History Narrative   Not on file   Social Determinants of Health   Financial Resource Strain: Not on file  Food Insecurity: Not on file  Transportation Needs: Not on file  Physical Activity: Not on file  Stress: Not on file  Social Connections: Not on file  Intimate Partner Violence: Not on file     REVIEW OF SYSTEMS:   [X]  denotes positive finding, [ ]  denotes negative finding Cardiac  Comments:  Chest pain or chest pressure:    Shortness of breath upon exertion:    Short of breath when lying flat:    Irregular heart rhythm:        Vascular    Pain in calf, thigh, or hip brought on by ambulation:    Pain in feet at night that wakes you up from your sleep:     Blood clot in your veins:    Leg swelling:         Pulmonary    Oxygen at home:    Productive cough:     Wheezing:         Neurologic    Sudden weakness in arms or legs:     Sudden numbness in arms or legs:     Sudden onset of difficulty speaking or slurred speech:    Temporary loss of vision in one eye:     Problems with dizziness:         Gastrointestinal    Blood in stool:     Vomited blood:         Genitourinary    Burning when urinating:     Blood in urine:        Psychiatric    Major depression:         Hematologic    Bleeding problems:    Problems with blood clotting too easily:        Skin    Rashes or ulcers:        Constitutional    Fever or chills:      PHYSICAL EXAMINATION:  Today's Vitals   01/31/21 1526  BP: 102/60  Pulse: (!) 50  Resp:  20  Temp: 98.2 F (36.8 C)  TempSrc: Temporal  SpO2: 99%  Weight: 139 lb 11.2 oz (63.4 kg)  Height: 5\' 11"  (1.803 m)   Body mass index is 19.48  kg/m.   General:  WDWN in NAD; vital signs documented above Gait: Not observed HENT: WNL, normocephalic Pulmonary: normal non-labored breathing , without wheezing Cardiac: regular HR, without carotid bruits Abdomen: soft, NT, no masses; aortic pulse is not palpable Skin: without rashes Vascular Exam/Pulses:  Right Left  Radial 2+ (normal) 2+ (normal)  Femoral 2+ (normal) 2+ (normal)  Popliteal Unable to palpate Unable to palpate  DP Brisk monophasic Brisk monophasic  PT Brisk monophasic Brisk monophasic  Peroneal Brisk monophasic Brisk monophasic   Extremities: without ischemic changes, without Gangrene , without cellulitis; without open wounds;  Musculoskeletal: no muscle wasting or atrophy  Neurologic: A&O X 3 Psychiatric:  The pt has Normal affect.   Non-Invasive Vascular Imaging:   ABI's/TBI's on 01/31/2021: +-------+-----------+-----------+------------+------------+  ABI/TBIToday's ABIToday's TBIPrevious ABIPrevious TBI  +-------+-----------+-----------+------------+------------+  Right  [place]         0.21       [place]          0             +-------+-----------+-----------+------------+------------+  Left   0.71       0.23       [place]          0.19          +-------+-----------+-----------+------------+------------+  ASSESSMENT/PLAN:: 70 y.o. male here for follow up for ollow up for PAD.  He had extensive right iliofemoral endarterectomy with bovine pericardial patch angioplasty by Dr. Stanford Breed on 05/20/2020 due to ischemic rest pain.   -pt doing well without claudication, rest pain or non healing wounds.  Brisk doppler signals bilateral feet. -he will continue graduated walking program -pt will f/u in 6 months with ABI.  If doing well at that point, will extend his f/u to one year.   -he will call sooner if he has any issues.  -continue statin/asa   Leontine Locket, [facility] [phone]  Clinic MD:   Carlis Abbott

## 2021-02-02 MED ORDER — PRAVASTATIN SODIUM 40 MG PO TABS: ORAL_TABLET | ORAL | 0 refills | Status: DC

## 2021-02-03 ENCOUNTER — Other Ambulatory Visit: Payer: Self-pay

## 2021-02-03 DIAGNOSIS — I739 Peripheral vascular disease, unspecified: Secondary | ICD-10-CM

## 2021-02-08 ENCOUNTER — Other Ambulatory Visit: Payer: Self-pay | Admitting: Internal Medicine

## 2021-02-08 DIAGNOSIS — I10 Essential (primary) hypertension: Secondary | ICD-10-CM

## 2021-02-28 ENCOUNTER — Other Ambulatory Visit: Payer: Self-pay

## 2021-02-28 DIAGNOSIS — I10 Essential (primary) hypertension: Secondary | ICD-10-CM

## 2021-03-08 NOTE — Telephone Encounter (Signed)
Next appt scheduled 03/09/21 with Dr Gilford Rile.

## 2021-03-08 NOTE — Telephone Encounter (Signed)
spironolactone (ALDACTONE) 25 MG tablet (Expired, REFILL REQUEST @ [facility] [phone] - [place], [place] - [address] AT [place].

## 2021-03-09 ENCOUNTER — Other Ambulatory Visit: Payer: Self-pay

## 2021-03-09 ENCOUNTER — Ambulatory Visit (INDEPENDENT_AMBULATORY_CARE_PROVIDER_SITE_OTHER): Payer: Medicare Other | Admitting: Internal Medicine

## 2021-03-09 ENCOUNTER — Encounter: Payer: Self-pay | Admitting: Internal Medicine

## 2021-03-09 VITALS — BP 97/66 | HR 62 | Temp 98.2°F | Ht 71.0 in | Wt 142.6 lb

## 2021-03-09 DIAGNOSIS — N1832 Chronic kidney disease, stage 3b: Secondary | ICD-10-CM

## 2021-03-09 DIAGNOSIS — E1122 Type 2 diabetes mellitus with diabetic chronic kidney disease: Secondary | ICD-10-CM

## 2021-03-09 DIAGNOSIS — E782 Mixed hyperlipidemia: Secondary | ICD-10-CM

## 2021-03-09 DIAGNOSIS — I129 Hypertensive chronic kidney disease with stage 1 through stage 4 chronic kidney disease, or unspecified chronic kidney disease: Secondary | ICD-10-CM

## 2021-03-09 DIAGNOSIS — I10 Essential (primary) hypertension: Secondary | ICD-10-CM

## 2021-03-09 DIAGNOSIS — Z0000 Encounter for general adult medical examination without abnormal findings: Secondary | ICD-10-CM

## 2021-03-09 MED ORDER — PRAVASTATIN SODIUM 40 MG PO TABS: ORAL_TABLET | ORAL | 0 refills | Status: DC

## 2021-03-09 MED ORDER — SPIRONOLACTONE 25 MG PO TABS
25.0000 mg | ORAL_TABLET | Freq: Every day | ORAL | 2 refills | Status: DC
Start: 2021-03-09 — End: 2021-05-11

## 2021-03-09 NOTE — Patient Instructions (Addendum)
To Mr. Caine,  I am glad you are doing well today! Today we discussed your medication refills, blood pressure, and screening labs.   For your refills I have filled your pravastatin and spironolactone.  For your blood pressure, we will continue your current regimen.   For your screening labs today, we will assess your cholesterol levels and your blood sugars.   It was a pleasure working with you. We will have you follow back with [place] in 6 months.  Have a good day!  Maudie Mercury, MD

## 2021-03-09 NOTE — Progress Notes (Signed)
° °  CC: Check Up   HPI:  Mr.Kaylee C Ellerby is a 71 y.o. person, with a PMH noted below, who presents to the clinic for a Check Up. To see the management of their acute and chronic conditions, please see the A&P note under the Encounters tab.   Past Medical History:  Diagnosis Date   Chronic renal insufficiency 03/21/2006   CrCl <50 ml   DDD (degenerative disc disease), cervical    GERD (gastroesophageal reflux disease) 03/22/2006   Hand numbness 10/07/2009   Hyperlipidemia 03/22/2008   Hypertension    Hypertension 03/22/2006   Left ventricular hypertrophy 03/22/2006   Leg pain, left 11/15/2009   Lumbar strain 07/15/2008   Renal insufficiency    Soft tissue disorder 08/2008   Tobacco abuse 03/22/2006   2 ppd x 30 years   Review of Systems:   Review of Systems  Constitutional:  Negative for chills, fever and weight loss.  HENT:  Negative for tinnitus.   Eyes:  Negative for blurred vision.  Gastrointestinal:  Negative for abdominal pain, constipation, diarrhea, heartburn, nausea and vomiting.  Genitourinary:  Negative for dysuria, frequency, hematuria and urgency.  Skin:  Negative for itching and rash.  Neurological:  Negative for dizziness and headaches.    Physical Exam:  Vitals:   03/09/21 1317  BP: 97/66  Pulse: 62  Temp: 98.2 F (36.8 C)  TempSrc: Oral  SpO2: 100%  Weight: 142 lb 9.6 oz (64.7 kg)  Height: 5\' 11"  (1.803 m)   Physical Exam Vitals reviewed.  Constitutional:      General: He is not in acute distress.    Appearance: Normal appearance. He is not ill-appearing or toxic-appearing.  HENT:     Head: Normocephalic and atraumatic.  Eyes:     General:        Right eye: No discharge.        Left eye: No discharge.     Conjunctiva/sclera: Conjunctivae normal.  Cardiovascular:     Rate and Rhythm: Normal rate and regular rhythm.     Pulses: Normal pulses.     Heart sounds: Normal heart sounds. No murmur heard.   No friction rub. No gallop.   Pulmonary:     Effort: Pulmonary effort is normal.     Breath sounds: Normal breath sounds. No wheezing, rhonchi or rales.  Abdominal:     General: Bowel sounds are normal.     Palpations: Abdomen is soft.     Tenderness: There is no abdominal tenderness. There is no guarding.  Musculoskeletal:        General: No swelling.     Right lower leg: No edema.     Left lower leg: No edema.  Neurological:     General: No focal deficit present.     Mental Status: He is alert and oriented to person, place, and time.  Psychiatric:        Mood and Affect: Mood normal.        Behavior: Behavior normal.     Assessment & Plan:   See Encounters Tab for problem based charting.  Patient discussed with Dr. Jimmye Norman

## 2021-03-10 ENCOUNTER — Encounter: Payer: Self-pay | Admitting: Internal Medicine

## 2021-03-10 NOTE — Assessment & Plan Note (Signed)
Offered patient a referral to GI for continue colonoscopy, but patient declined. On chart review, no FH of CRC.

## 2021-03-10 NOTE — Assessment & Plan Note (Signed)
Patient with CKD III, presenting for follow up, he states that he is doing well and following with [facility].  - Last BMP stable from [place] to FU with [facility]

## 2021-03-10 NOTE — Assessment & Plan Note (Addendum)
Vitals with BMI 03/09/2021 01/31/2021 06/16/2020  Height 5\' 11"  5\' 11"  5\' 11"   Weight 142 lbs 10 oz 139 lbs 11 oz 141 lbs  BMI 19.9 57.84 69.62  Systolic 97 952 841  Diastolic 66 60 66  Pulse 62 50 54   Patient currently on amlodipine 10 mg, spironolactone 25 mg, Clonidine 0.2 mg BID, lisinopril 25 mg , and Hydralazine 100 BID presents to the clinic today for follow up on his HTN. It is well controlled today, and he has no complaints of dizziness, light headedness, or other side effects. He does additionally follow with [facility].  - Continue current regimen - Monitor vitals at next visit

## 2021-03-10 NOTE — Assessment & Plan Note (Addendum)
Patient with prior uncontrolled DMT2 (11.7 in 2016) is currently lifestyle controlled. Will assess A1c.  - Screening A1c

## 2021-03-10 NOTE — Assessment & Plan Note (Addendum)
Requesting refill on medication his pravastatin 40 mg.  - Lipid profile - Refill Pravastatin 40 mg QD

## 2021-03-30 NOTE — Progress Notes (Addendum)
Internal Medicine Clinic Attending  Case discussed with Dr. Gilford Rile  at the time of the visit.  We reviewed the residents history and exam and pertinent patient test results.  BP is lower than necessary; in an older person with CKD and on diuretic therapy and high dose clonidine (associated with falls), would consider reducing BP control to avoid adverse event.  Cr 2.16 and K 4.7 in  nephrology office 01/2021 (under media tab).

## 2021-05-11 ENCOUNTER — Other Ambulatory Visit: Payer: Self-pay

## 2021-05-11 DIAGNOSIS — I10 Essential (primary) hypertension: Secondary | ICD-10-CM

## 2021-05-11 MED ORDER — SPIRONOLACTONE 25 MG PO TABS: 25.0000 mg | ORAL_TABLET | Freq: Every day | ORAL | 2 refills | Status: DC

## 2021-05-19 ENCOUNTER — Other Ambulatory Visit: Payer: Self-pay

## 2021-05-19 ENCOUNTER — Ambulatory Visit (INDEPENDENT_AMBULATORY_CARE_PROVIDER_SITE_OTHER): Payer: Medicare Other | Admitting: *Deleted

## 2021-05-19 DIAGNOSIS — Z5941 Food insecurity: Secondary | ICD-10-CM | POA: Diagnosis not present

## 2021-05-19 DIAGNOSIS — Z0001 Encounter for general adult medical examination with abnormal findings: Secondary | ICD-10-CM

## 2021-05-19 DIAGNOSIS — Z0000 Encounter for general adult medical examination without abnormal findings: Secondary | ICD-10-CM

## 2021-05-19 NOTE — Progress Notes (Signed)
? ?Subjective:  ? William Perkins is a 71 y.o. male who presents for an Initial Medicare Annual Wellness Visit. I connected with  Darcella Gasman on 05/19/21 by a audio enabled telemedicine application and verified that I am speaking with the correct person using two identifiers. ? ?Patient Location: Home ? ?Provider Location: Office/Clinic ? ?I discussed the limitations of evaluation and management by telemedicine. The patient expressed understanding and agreed to proceed.  ? ?Review of Systems  Defer to PCP ?  ? ?   ?Objective:  ?  ?There were no vitals filed for this visit. ?There is no height or weight on file to calculate BMI. ? ?Advanced Directives 05/19/2021 03/09/2021 05/20/2020 05/18/2020 05/04/2020 12/25/2019 11/16/2019  ?Does Patient Have a Medical Advance Directive? No No No No No No No  ?Type of Advance Directive - - - - - - -  ?Would patient like information on creating a medical advance directive? No - Patient declined No - Patient declined No - Patient declined No - Patient declined No - Patient declined No - Patient declined No - Patient declined  ? ? ?Current Medications (verified) ?Outpatient Encounter Medications as of 05/19/2021  ?Medication Sig  ? acetaminophen (TYLENOL) 500 MG tablet Take 1,000 mg by mouth every 6 (six) hours as needed for mild pain or moderate pain.  ? amLODipine (NORVASC) 10 MG tablet Take 1 tablet (10 mg total) by mouth daily.  ? cloNIDine (CATAPRES) 0.2 MG tablet TAKE 1 TABLET BY MOUTH TWICE DAILY  ? hydrALAZINE (APRESOLINE) 100 MG tablet Take 1 tablet (100 mg total) by mouth 2 (two) times daily.  ? lisinopril (ZESTRIL) 40 MG tablet Take 1 tablet (40 mg total) by mouth daily.  ? polyethylene glycol (MIRALAX / GLYCOLAX) 17 g packet Take 17 g by mouth 2 (two) times daily. (Patient taking differently: Take 17 g by mouth daily as needed for mild constipation or moderate constipation.)  ? pravastatin (PRAVACHOL) 40 MG tablet TAKE 1 TABLET(40 MG) BY MOUTH EVERY EVENING  ? spironolactone  (ALDACTONE) 25 MG tablet Take 1 tablet (25 mg total) by mouth daily.  ? aspirin EC 81 MG tablet Take 81 mg by mouth daily. Swallow whole. (Patient not taking: Reported on 05/19/2021)  ? LOKELMA 5 g packet  (Patient not taking: Reported on 05/19/2021)  ? Menthol, Topical Analgesic, (ICY HOT) 7.5 % (Roll) MISC Apply 1 each topically daily as needed (pain). (Patient not taking: Reported on 05/19/2021)  ? ?No facility-administered encounter medications on file as of 05/19/2021.  ? ? ?Allergies (verified) ?Patient has no known allergies.  ? ?History: ?Past Medical History:  ?Diagnosis Date  ? Chronic renal insufficiency 03/21/2006  ? CrCl <50 ml  ? DDD (degenerative disc disease), cervical   ? GERD (gastroesophageal reflux disease) 03/22/2006  ? Hand numbness 10/07/2009  ? Hyperlipidemia 03/22/2008  ? Hypertension   ? Hypertension 03/22/2006  ? Left ventricular hypertrophy 03/22/2006  ? Leg pain, left 11/15/2009  ? Lumbar strain 07/15/2008  ? Renal insufficiency   ? Soft tissue disorder 08/2008  ? Tobacco abuse 03/22/2006  ? 2 ppd x 30 years  ? ?Past Surgical History:  ?Procedure Laterality Date  ? ABDOMINAL AORTOGRAM W/LOWER EXTREMITY N/A 05/18/2020  ? Procedure: ABDOMINAL AORTOGRAM W/LOWER EXTREMITY;  Surgeon: Cherre Robins, MD;  Location: [facility];  Service: Cardiovascular;  Laterality: N/A;  ? COLONOSCOPY    ? ENDARTERECTOMY FEMORAL Right 05/20/2020  ? Procedure: RIGHT EXTENDED FEMORAL ENDARTERECTOMY AND PROFUNDAPLASTY;  Surgeon: Jamelle Haring  N, MD;  Location: [facility];  Service: Vascular;  Laterality: Right;  ? WISDOM TOOTH EXTRACTION    ? ?Family History  ?Problem Relation Age of Onset  ? Diabetes Mother   ? Diabetes Sister   ? Diabetes Brother   ? Heart disease Brother   ? Rectal cancer Neg Hx   ? Stomach cancer Neg Hx   ? Colon cancer Neg Hx   ? ?Social History  ? ?Socioeconomic History  ? Marital status: Married  ?  Spouse name: Not on file  ? Number of children: Not on file  ? Years of education: 42  ?  Highest education level: Not on file  ?Occupational History  ? Not on file  ?Tobacco Use  ? Smoking status: Former  ?  Packs/day: 1.00  ?  Years: 44.00  ?  Pack years: 44.00  ?  Types: Cigarettes  ?  Quit date: 11/21/2013  ?  Years since quitting: 7.4  ? Smokeless tobacco: Never  ? Tobacco comments:  ?  Stopped 11/21/2013   ?Vaping Use  ? Vaping Use: Never used  ?Substance and Sexual Activity  ? Alcohol use: No  ?  Alcohol/week: 0.0 standard drinks  ?  Comment: Quit 2015  ? Drug use: No  ? Sexual activity: Yes  ?  Partners: Female  ?Other Topics Concern  ? Not on file  ?Social History Narrative  ? Not on file  ? ?Social Determinants of Health  ? ?Financial Resource Strain: Medium Risk  ? Difficulty of Paying Living Expenses: Somewhat hard  ?Food Insecurity: Food Insecurity Present  ? Worried About Charity fundraiser in the Last Year: Never true  ? Ran Out of Food in the Last Year: Sometimes true  ?Transportation Needs: No Transportation Needs  ? Lack of Transportation (Medical): No  ? Lack of Transportation (Non-Medical): No  ?Physical Activity: Inactive  ? Days of Exercise per Week: 0 days  ? Minutes of Exercise per Session: 0 min  ?Stress: No Stress Concern Present  ? Feeling of Stress : Not at all  ?Social Connections: Moderately Isolated  ? Frequency of Communication with Friends and Family: Once a week  ? Frequency of Social Gatherings with Friends and Family: Once a week  ? Attends Religious Services: 1 to 4 times per year  ? Active Member of Clubs or Organizations: No  ? Attends Archivist Meetings: Never  ? Marital Status: Married  ? ? ?Tobacco Counseling ?Counseling given: Not Answered ?Tobacco comments: Stopped 11/21/2013  ? ? ?Clinical Intake: ? ?Pre-visit preparation completed: Yes ? ?Pain : No/denies pain ? ?  ? ?Nutritional Risks: None ?Diabetes: Yes ? ?How often do you need to have someone help you when you read instructions, pamphlets, or other written materials from your doctor or pharmacy?:  5 - Always ?What is the last grade level you completed in school?: 12 grade ? ?Diabetic?YES  ?Nutrition Risk Assessment: ? ?Has the patient had any N/V/D within the last 2 months?  No  ?Does the patient have any non-healing wounds?  No  ?Has the patient had any unintentional weight loss or weight gain?  Yes  (weight loss) ? ?Diabetes: ? ?Is the patient diabetic?  Yes  ?If diabetic, was a CBG obtained today?   NA ?Did the patient bring in their glucometer from home?   NA ?How often do you monitor your CBG's? Does not monitor.  ? ?Financial Strains and Diabetes Management: ? ?Are you having any financial strains  with the device, your supplies or your medication? No .  ?Does the patient want to be seen by Chronic Care Management for management of their diabetes?   NO ?Would the patient like to be referred to a Nutritionist or for Diabetic Management?  No  ? ?Diabetic Exams: ? ?Diabetic Eye Exam: Overdue for diabetic eye exam. Pt has been advised about the importance in completing this exam. Patient advised to call and schedule an eye exam. ?Diabetic Foot Exam: Overdue, Pt has been advised about the importance in completing this exam. Pt is scheduled for diabetic foot exam on .medi.  ? ?Interpreter Needed?: No ? ?Information entered by :: [email or web address] ? ? ?Activities of Daily Living ?In your present state of health, do you have any difficulty performing the following activities: 05/19/2021 03/09/2021  ?Hearing? N N  ?Vision? N N  ?Difficulty concentrating or making decisions? N N  ?Walking or climbing stairs? N N  ?Dressing or bathing? N N  ?Doing errands, shopping? N N  ?Some recent data might be hidden  ? ? ?Patient Care Team: ?Marianna Payment, MD as PCP - General ? ?Indicate any recent Medical Services you may have received from other than [facility] providers in the past year (date may be approximate). ? ?   ?Assessment:  ? This is a routine wellness examination for William Perkins. ? ?Hearing/Vision screen ?No results  found. ? ?Dietary issues and exercise activities discussed: ?  ? ? Goals Addressed   ?None ?  ? ?Depression Screen ?PHQ 2/9 Scores 05/19/2021 03/09/2021 05/04/2020 04/07/2020 11/16/2019 09/03/2019 10/16/2018  ?PHQ - 2 Score 0 0

## 2021-05-19 NOTE — Patient Instructions (Signed)

## 2021-05-22 ENCOUNTER — Telehealth: Payer: Self-pay

## 2021-05-22 NOTE — Telephone Encounter (Signed)
? ?  Telephone encounter was:  Unsuccessful.  05/22/2021 ?Name: William Perkins MRN: [id number] DOB: 04-29-1950 ? ?Unsuccessful outbound call made today to assist with:  Food Insecurity ? ?Outreach Attempt:  1st Attempt ? ?A HIPAA compliant voice message was left requesting a return call.  Instructed patient to call back at [phone] at their earliest convenience. ? ?Cameron Proud ?Care Guide, Embedded Care Coordination ?[facility]  Care management  ?[place], [place]  ?Main Phone: [phone]  E-mail: [email or web address]  ?Website: [email or web address] ? ? ? ?

## 2021-05-25 ENCOUNTER — Other Ambulatory Visit: Payer: Self-pay

## 2021-05-25 DIAGNOSIS — E782 Mixed hyperlipidemia: Secondary | ICD-10-CM

## 2021-05-26 MED ORDER — PRAVASTATIN SODIUM 40 MG PO TABS: ORAL_TABLET | ORAL | 0 refills | Status: DC

## 2021-05-29 ENCOUNTER — Telehealth: Payer: Self-pay

## 2021-05-29 DIAGNOSIS — N189 Chronic kidney disease, unspecified: Secondary | ICD-10-CM | POA: Diagnosis not present

## 2021-05-29 DIAGNOSIS — D631 Anemia in chronic kidney disease: Secondary | ICD-10-CM | POA: Diagnosis not present

## 2021-05-29 DIAGNOSIS — N1832 Chronic kidney disease, stage 3b: Secondary | ICD-10-CM | POA: Diagnosis not present

## 2021-05-29 DIAGNOSIS — E559 Vitamin D deficiency, unspecified: Secondary | ICD-10-CM | POA: Diagnosis not present

## 2021-05-29 DIAGNOSIS — I129 Hypertensive chronic kidney disease with stage 1 through stage 4 chronic kidney disease, or unspecified chronic kidney disease: Secondary | ICD-10-CM | POA: Diagnosis not present

## 2021-05-29 NOTE — Telephone Encounter (Signed)
? ?  Telephone encounter was:  Successful.  ?05/29/2021 ?Name: William Perkins MRN: [id number] DOB: 1950/10/22 ? ?TREVINO WYATT is a 71 y.o. year old male who is a primary care patient of Marianna Payment, MD . The community resource team was consulted for assistance with Food Insecurity ? ?Care guide performed the following interventions:  Pt called back. He is interested in food pantries and would like them to be sent out by mail. ? ?Follow Up Plan:  Care guide will follow up with patient by phone over the next few days to ensure mail has been received.  ? ?Cameron Proud ?Care Guide, Embedded Care Coordination ?[facility]  Care management  ?[place], [place]  ?Main Phone: [phone]  E-mail: [email or web address]  ?Website: [email or web address] ? ? ? ?

## 2021-05-29 NOTE — Telephone Encounter (Signed)
? ?  Telephone encounter was:  Unsuccessful.  05/29/2021 ?Name: William Perkins MRN: [id number] DOB: 01/31/1951 ? ?Unsuccessful outbound call made today to assist with:  Food Insecurity ? ?Outreach Attempt:  2nd Attempt ? ?A HIPAA compliant voice message was left requesting a return call.  Instructed patient to call back at [phone] at their earliest convenience. ? ?Cameron Proud ?Care Guide, Embedded Care Coordination ?[facility]  Care management  ?[place], [place]  ?Main Phone: [phone]  E-mail: [email or web address]  ?Website: [email or web address] ? ? ? ?

## 2021-06-05 ENCOUNTER — Telehealth: Payer: Self-pay

## 2021-06-05 DIAGNOSIS — E875 Hyperkalemia: Secondary | ICD-10-CM | POA: Diagnosis not present

## 2021-06-05 NOTE — Telephone Encounter (Signed)
? ?  Telephone encounter was:  Successful.  ?06/05/2021 ?Name: William Perkins MRN: [id number] DOB: January 22, 1951 ? ?William Perkins is a 71 y.o. year old male who is a primary care patient of Marianna Payment, MD . The community resource team was consulted for assistance with Food Insecurity ? ?Care guide performed the following interventions:  Pt advised mail has not been received yet. I will give it a few more days. ? ?Follow Up Plan:  Care guide will follow up with patient by phone over the next few days. ? ?Cameron Proud ?Care Guide, Embedded Care Coordination ?[facility]  Care management  ?[place], [place]  ?Main Phone: [phone]  E-mail: [email or web address]  ?Website: [email or web address] ? ? ? ?

## 2021-06-08 ENCOUNTER — Telehealth: Payer: Self-pay

## 2021-06-08 NOTE — Telephone Encounter (Signed)
? ?  Telephone encounter was:  Unsuccessful.  06/08/2021 ?Name: William Perkins MRN: [id number] DOB: [birth year] ? ?Unsuccessful outbound call made today to assist with:  Food Insecurity ? ?Outreach Attempt:  2nd Attempt ? ?A HIPAA compliant voice message was left requesting a return call.  Instructed patient to call back at [phone] at their earliest convenience. ? ?Cameron Proud ?Care Guide, Embedded Care Coordination ?[facility]  Care management  ?[place], [place]  ?Main Phone: [phone]  E-mail: [email or web address]  ?Website: [email or web address] ? ? ? ?

## 2021-06-09 ENCOUNTER — Telehealth: Payer: Self-pay

## 2021-06-09 NOTE — Telephone Encounter (Signed)
? ?  Telephone encounter was:  Successful.  ?06/09/2021 ?Name: William Perkins MRN: [id number] DOB: Nov 08, 1950 ? ?William Perkins is a 71 y.o. year old male who is a primary care patient of Marianna Payment, MD . The community resource team was consulted for assistance with Food Insecurity ? ?Care guide performed the following interventions:  I was able to reach pt on spouse's phone. Patient advised he received documents in mail and at this time, he does not have any further questions or concerns. Pt has been educated on my phone number. ? ?Follow Up Plan:  No further follow up planned at this time. The patient has been provided with needed resources. ? ?Cameron Proud ?Care Guide, Embedded Care Coordination ?[facility]  Care management  ?[place], [place]  ?Main Phone: [phone]  E-mail: [email or web address]  ?Website: [email or web address] ? ? ?

## 2021-08-11 ENCOUNTER — Other Ambulatory Visit: Payer: Self-pay | Admitting: *Deleted

## 2021-08-11 DIAGNOSIS — I10 Essential (primary) hypertension: Secondary | ICD-10-CM

## 2021-08-17 ENCOUNTER — Other Ambulatory Visit: Payer: Self-pay

## 2021-08-17 ENCOUNTER — Ambulatory Visit (INDEPENDENT_AMBULATORY_CARE_PROVIDER_SITE_OTHER): Payer: Medicare Other | Admitting: Internal Medicine

## 2021-08-17 ENCOUNTER — Encounter: Payer: Self-pay | Admitting: Internal Medicine

## 2021-08-17 VITALS — BP 132/66 | HR 71 | Temp 98.2°F | Resp 24 | Ht 71.0 in | Wt 141.1 lb

## 2021-08-17 DIAGNOSIS — E1122 Type 2 diabetes mellitus with diabetic chronic kidney disease: Secondary | ICD-10-CM | POA: Diagnosis not present

## 2021-08-17 DIAGNOSIS — E782 Mixed hyperlipidemia: Secondary | ICD-10-CM

## 2021-08-17 DIAGNOSIS — K219 Gastro-esophageal reflux disease without esophagitis: Secondary | ICD-10-CM | POA: Diagnosis not present

## 2021-08-17 DIAGNOSIS — Z794 Long term (current) use of insulin: Secondary | ICD-10-CM | POA: Diagnosis not present

## 2021-08-17 DIAGNOSIS — E1151 Type 2 diabetes mellitus with diabetic peripheral angiopathy without gangrene: Secondary | ICD-10-CM

## 2021-08-17 DIAGNOSIS — I10 Essential (primary) hypertension: Secondary | ICD-10-CM

## 2021-08-17 DIAGNOSIS — Z87891 Personal history of nicotine dependence: Secondary | ICD-10-CM

## 2021-08-17 DIAGNOSIS — Z0000 Encounter for general adult medical examination without abnormal findings: Secondary | ICD-10-CM

## 2021-08-17 DIAGNOSIS — I129 Hypertensive chronic kidney disease with stage 1 through stage 4 chronic kidney disease, or unspecified chronic kidney disease: Secondary | ICD-10-CM

## 2021-08-17 DIAGNOSIS — N1832 Chronic kidney disease, stage 3b: Secondary | ICD-10-CM | POA: Diagnosis not present

## 2021-08-17 DIAGNOSIS — I739 Peripheral vascular disease, unspecified: Secondary | ICD-10-CM

## 2021-08-17 MED ORDER — CLONIDINE HCL 0.1 MG PO TABS: 0.1000 mg | ORAL_TABLET | Freq: Two times a day (BID) | ORAL | 3 refills | Status: AC

## 2021-08-17 MED ORDER — HYDRALAZINE HCL 100 MG PO TABS: 50.0000 mg | ORAL_TABLET | Freq: Two times a day (BID) | ORAL | 3 refills | Status: DC

## 2021-08-17 NOTE — Assessment & Plan Note (Signed)
Well controlled, no symptoms endorsed by the patient. He is not taking anything prn.

## 2021-08-17 NOTE — Patient Instructions (Addendum)
William Perkins, it was a pleasure seeing you today! You endorsed feeling well today. Below are some of the things we talked about this visit. We look forward to seeing you in the follow up appointment!  Today we discussed: You presented you presented for a follow-up.  He denied any acute concerns.  He stated you are taking all your medications.  Your blood pressure was doing well so we will have you continue taking your blood pressure medications without making any changes.  Follow-up with your kidney doctors. Continue taking your cholesterol medication, we will check your cholesterol today. Please restart taking the aspirin and follow-up with vascular surgery.  It appears that the nosebleed was secondary to a cold so I will restart the aspirin.  Aspirin does increase your risk of bleeding but is necessary since you have a stent placed.  The vascular surgeons wanted to see you in 6 months and it is that time now so please schedule appointment with them.  We look forward to seeing you in taking care of you!  I have ordered the following labs today:   Lab Orders         BMP8+Anion Gap         Lipid Profile         Hemoglobin A1c       Referrals ordered today:   Referral Orders  No referral(s) requested today     I have ordered the following medication/changed the following medications:   Stop the following medications: None  Start the following medications: None. Start aspirin 81 mg.  Follow-up: 6 months  Please make sure to arrive 15 minutes prior to your next appointment. If you arrive late, you may be asked to reschedule.   We look forward to seeing you next time. Please call our clinic at [phone] if you have any questions or concerns. The best time to call is Monday-Friday from 9am-4pm, but there is someone available 24/7. If after hours or the weekend, call the main hospital number and ask for the Internal Medicine Resident On-Call. If you need medication refills, please  notify your pharmacy one week in advance and they will send [place] a request.  Thank you for letting us take part in your care. Wishing you the best!  Thank you, Idamae Schuller, MD

## 2021-08-17 NOTE — Progress Notes (Signed)
   CC: follow up  HPI:  William Perkins is a 71 y.o. with medical history as below presenting to [facility] for follow up.   Please see problem-based list for further details, assessments, and plans.  Past Medical History:  Diagnosis Date   Chronic renal insufficiency 03/21/2006   CrCl <50 ml   DDD (degenerative disc disease), cervical    GERD (gastroesophageal reflux disease) 03/22/2006   Hand numbness 10/07/2009   Hyperlipidemia 03/22/2008   Hypertension    Hypertension 03/22/2006   Left ventricular hypertrophy 03/22/2006   Leg pain, left 11/15/2009   Lumbar strain 07/15/2008   Renal insufficiency    Soft tissue disorder 08/2008   Tobacco abuse 03/22/2006   2 ppd x 30 years     Current Outpatient Medications (Cardiovascular):    amLODipine (NORVASC) 10 MG tablet, Take 1 tablet (10 mg total) by mouth daily.   cloNIDine (CATAPRES) 0.2 MG tablet, TAKE 1 TABLET BY MOUTH TWICE DAILY   hydrALAZINE (APRESOLINE) 100 MG tablet, Take 1 tablet (100 mg total) by mouth 2 (two) times daily.   lisinopril (ZESTRIL) 40 MG tablet, Take 1 tablet (40 mg total) by mouth daily.   pravastatin (PRAVACHOL) 40 MG tablet, TAKE 1 TABLET(40 MG) BY MOUTH EVERY EVENING   spironolactone (ALDACTONE) 25 MG tablet, Take 1 tablet (25 mg total) by mouth daily.   Current Outpatient Medications (Analgesics):    acetaminophen (TYLENOL) 500 MG tablet, Take 1,000 mg by mouth every 6 (six) hours as needed for mild pain or moderate pain.   aspirin EC 81 MG tablet, Take 81 mg by mouth daily. Swallow whole. (Patient not taking: Reported on 05/19/2021)   Current Outpatient Medications (Other):    LOKELMA 5 g packet,    Menthol, Topical Analgesic, (ICY HOT) 7.5 % (Roll) MISC, Apply 1 each topically daily as needed (pain). (Patient not taking: Reported on 05/19/2021)   polyethylene glycol (MIRALAX / GLYCOLAX) 17 g packet, Take 17 g by mouth 2 (two) times daily. (Patient taking differently: Take 17 g by mouth daily as needed  for mild constipation or moderate constipation.)  Review of Systems:  Review of system negative unless stated in the problem list or HPI.    Physical Exam:  Vitals:   08/17/21 1540  BP: 132/66  Pulse: 71  Resp: (!) 24  Temp: 98.2 F (36.8 C)  TempSrc: Oral  SpO2: 100%  Weight: 141 lb 1.6 oz (64 kg)  Height: '5\' 11"'$  (1.803 m)    Physical Exam General: NAD HENT: NCAT Lungs: CTAB, no wheeze, rhonchi or rales.  Cardiovascular: Normal heart sounds, no r/m/g, 2+ pulses in all extremities. No LE edema Abdomen: No TTP, normal bowel sounds MSK: No asymmetry or muscle atrophy.  Skin: no lesions noted on exposed skin Neuro: Alert and oriented x4. CN grossly intact Psych: Normal mood and normal affect   Assessment & Plan:   See Encounters Tab for problem based charting.  Patient discussed with Dr. Garald Balding, MD

## 2021-08-18 ENCOUNTER — Encounter: Payer: Self-pay | Admitting: Internal Medicine

## 2021-08-18 LAB — LIPID PANEL
Chol/HDL Ratio: 3.2 ratio (ref 0.0–5.0)
Cholesterol, Total: 186 mg/dL (ref 100–199)
HDL: 58 mg/dL (ref >39)
LDL Chol Calc (NIH): 109 mg/dL — ABNORMAL HIGH (ref 0–99)
Triglycerides: 109 mg/dL (ref 0–149)
VLDL Cholesterol Cal: 19 mg/dL (ref 5–40)

## 2021-08-18 LAB — BMP8+ANION GAP
Anion Gap: 18 mmol/L (ref 10.0–18.0)
BUN/Creatinine Ratio: 15 (ref 10–24)
BUN: 30 mg/dL — ABNORMAL HIGH (ref 8–27)
CO2: 16 mmol/L — ABNORMAL LOW (ref 20–29)
Calcium: 10 mg/dL (ref 8.6–10.2)
Chloride: 104 mmol/L (ref 96–106)
Creatinine, Ser: 1.98 mg/dL — ABNORMAL HIGH (ref 0.76–1.27)
Glucose: 112 mg/dL — ABNORMAL HIGH (ref 70–99)
Potassium: 4.4 mmol/L (ref 3.5–5.2)
Sodium: 138 mmol/L (ref 134–144)
eGFR: 35 mL/min/{1.73_m2} — ABNORMAL LOW (ref >59)

## 2021-08-18 LAB — HEMOGLOBIN A1C
Est. average glucose Bld gHb Est-mCnc: 134 mg/dL
Hgb A1c MFr Bld: 6.3 % — ABNORMAL HIGH (ref 4.8–5.6)

## 2021-08-18 MED ORDER — ROSUVASTATIN CALCIUM 20 MG PO TABS: 20.0000 mg | ORAL_TABLET | Freq: Every day | ORAL | 3 refills | Status: DC

## 2021-08-18 NOTE — Assessment & Plan Note (Signed)
Patient's DMII is controlled.  A1c 5.5 over a year ago. BMP showed glucose over 250.  No currently on meds. Tolerating medication without adverse effects. Counseled on the benefits of daily exercise, limiting processed foods and high sugar foods, and weight loss. No hypoglycemic episodes. Denies polydipsia, polyuria, weight loss, lethargy, blurry vision, and changes in sensation. No wounds or ulcer of bilateral feet. Benign physical exam and vitals wnl.   Continue dietary and lifestyle changes Continue lifestyle modifications Repeat A1c  Addendum: A1c was 6.3. Informed patient of the results and recommend continuing to improve diet with elimination of high carb foods.

## 2021-08-18 NOTE — Assessment & Plan Note (Signed)
Patient has HLD with goal of secondary prevention due to his PAD. Last lipid panel on 05/2020 showed Chol 143, LDL 73 . Reports compliance to his pravastatin 40 mg without adverse effects. Counseled on the importance of continued lifestyle modifications, including: weight loss, daily exercise, and healthy diet with limited processed and fatty foods. Patient has stent placed but he is not taking ASA due to nose bleed.  -Continue pravastatin 40 mg daily -Lipid panel ordered, follow up lipid panel and adjust medication as needed.  -Restart ASA and recommend follow up with vascular surgery.   Addendum: Lipid panel shows cholesterol 186, and LDL 109. We will discontinue pravastatin and start Crestor 20 mg to decrease LDL to goal of <70.

## 2021-08-18 NOTE — Progress Notes (Signed)
Internal Medicine Clinic Attending  Case discussed with Dr. Khan  At the time of the visit.  We reviewed the resident's history and exam and pertinent patient test results.  I agree with the assessment, diagnosis, and plan of care documented in the resident's note.  

## 2021-08-18 NOTE — Assessment & Plan Note (Addendum)
Patient has HLD with goal of secondary prevention due to his PAD. Last lipid panel on 05/2020 showed Chol 143, LDL 73 . Reports compliance to his pravastatin 40 mg without adverse effects. Counseled on the importance of continued lifestyle modifications, including: weight loss, daily exercise, and healthy diet with limited processed and fatty foods. Patient has stent placed but he is not taking ASA due to nose bleed.  -Continue pravastatin 40 mg daily -Lipid panel ordered, follow up lipid panel and adjust medication as needed.  -Restart ASA and recommend follow up with vascular surgery as they recommend 6 months   Addendum: Lipid panel shows cholesterol 186, and LDL 109. We will discontinue pravastatin and start Crestor 20 mg to decrease LDL to goal of <70.

## 2021-08-18 NOTE — Assessment & Plan Note (Signed)
Patient has HTN that is controlled. He sees Dr. Posey Pronto, nephrologist, q3-6 months. His next appointment is next month. BP at goal. BP in clinic today 132/66, HR 72. Reported home readings with SBP 120-130s, and Dia 60s-70s.  Home medications include amlodipine 10 mg qd, hydralazine 50 mg BID, lisinopril 40 mg qd, clonidine 0.1 mg BID. His clonidine and hydralazine was reduced by nephrologist. Reports good compliance. Tolerating medication without adverse effects. Denies headaches, vision changes, lightheadedness, chest pain, SHOB, leg swelling or changes in speech. Exam benign and vitals otherwise wnl.  Counseled on the importance of daily exercise, low salt diet, and weight loss. -Continue Amlodipine 10 mg , Clonidine 0.1 BID, hydralazine 50 mg BID, lisinopril 40 mg -Continue to follow with nephrology -BP log and bring to next visit  -Continue lifestyle changes -F/u on BMP   Addendum: BMP shows Cr 1.98 with GFR 35, K and Na are wnl.

## 2021-08-18 NOTE — Assessment & Plan Note (Signed)
-  Patient states he had an eye exam 6 months ago at [facility] and was told he has cataracts. He will submit records to [place].  -Patient states he will think about his colonoscopy after I told him of his previously results showing multiple polyps.  -Patient is denying any vaccination recommendations and states he will think about it.

## 2021-08-26 ENCOUNTER — Encounter: Payer: Self-pay | Admitting: *Deleted

## 2021-10-16 DIAGNOSIS — N1832 Chronic kidney disease, stage 3b: Secondary | ICD-10-CM | POA: Diagnosis not present

## 2021-10-26 DIAGNOSIS — E559 Vitamin D deficiency, unspecified: Secondary | ICD-10-CM | POA: Diagnosis not present

## 2021-10-26 DIAGNOSIS — D631 Anemia in chronic kidney disease: Secondary | ICD-10-CM | POA: Diagnosis not present

## 2021-10-26 DIAGNOSIS — I129 Hypertensive chronic kidney disease with stage 1 through stage 4 chronic kidney disease, or unspecified chronic kidney disease: Secondary | ICD-10-CM | POA: Diagnosis not present

## 2021-10-26 DIAGNOSIS — N1832 Chronic kidney disease, stage 3b: Secondary | ICD-10-CM | POA: Diagnosis not present

## 2021-11-10 ENCOUNTER — Telehealth: Payer: Self-pay

## 2021-11-10 DIAGNOSIS — I10 Essential (primary) hypertension: Secondary | ICD-10-CM

## 2021-11-10 MED ORDER — AMLODIPINE BESYLATE 10 MG PO TABS: 10.0000 mg | ORAL_TABLET | Freq: Every day | ORAL | 3 refills | Status: DC

## 2021-11-10 NOTE — Telephone Encounter (Signed)
refilled 

## 2021-11-30 ENCOUNTER — Other Ambulatory Visit: Payer: Self-pay

## 2021-11-30 DIAGNOSIS — I10 Essential (primary) hypertension: Secondary | ICD-10-CM

## 2021-12-01 MED ORDER — LISINOPRIL 40 MG PO TABS: 40.0000 mg | ORAL_TABLET | Freq: Every day | ORAL | 1 refills | Status: DC

## 2022-02-05 DIAGNOSIS — N1832 Chronic kidney disease, stage 3b: Secondary | ICD-10-CM | POA: Diagnosis not present

## 2022-02-15 DIAGNOSIS — N1832 Chronic kidney disease, stage 3b: Secondary | ICD-10-CM | POA: Diagnosis not present

## 2022-02-15 DIAGNOSIS — I129 Hypertensive chronic kidney disease with stage 1 through stage 4 chronic kidney disease, or unspecified chronic kidney disease: Secondary | ICD-10-CM | POA: Diagnosis not present

## 2022-02-15 DIAGNOSIS — D631 Anemia in chronic kidney disease: Secondary | ICD-10-CM | POA: Diagnosis not present

## 2022-02-15 DIAGNOSIS — E559 Vitamin D deficiency, unspecified: Secondary | ICD-10-CM | POA: Diagnosis not present

## 2022-03-22 ENCOUNTER — Other Ambulatory Visit: Payer: Self-pay

## 2022-03-22 ENCOUNTER — Ambulatory Visit (INDEPENDENT_AMBULATORY_CARE_PROVIDER_SITE_OTHER): Payer: Medicare Other | Admitting: Internal Medicine

## 2022-03-22 ENCOUNTER — Encounter: Payer: Self-pay | Admitting: Internal Medicine

## 2022-03-22 VITALS — BP 132/66 | HR 58 | Temp 98.4°F | Ht 71.0 in | Wt 138.1 lb

## 2022-03-22 DIAGNOSIS — N1832 Chronic kidney disease, stage 3b: Secondary | ICD-10-CM

## 2022-03-22 DIAGNOSIS — I10 Essential (primary) hypertension: Secondary | ICD-10-CM

## 2022-03-22 DIAGNOSIS — I129 Hypertensive chronic kidney disease with stage 1 through stage 4 chronic kidney disease, or unspecified chronic kidney disease: Secondary | ICD-10-CM | POA: Diagnosis not present

## 2022-03-22 DIAGNOSIS — Z87891 Personal history of nicotine dependence: Secondary | ICD-10-CM | POA: Diagnosis not present

## 2022-03-22 DIAGNOSIS — Z86010 Personal history of colonic polyps: Secondary | ICD-10-CM

## 2022-03-22 DIAGNOSIS — E1122 Type 2 diabetes mellitus with diabetic chronic kidney disease: Secondary | ICD-10-CM | POA: Diagnosis not present

## 2022-03-22 DIAGNOSIS — J438 Other emphysema: Secondary | ICD-10-CM

## 2022-03-22 DIAGNOSIS — E1151 Type 2 diabetes mellitus with diabetic peripheral angiopathy without gangrene: Secondary | ICD-10-CM | POA: Diagnosis not present

## 2022-03-22 DIAGNOSIS — I739 Peripheral vascular disease, unspecified: Secondary | ICD-10-CM

## 2022-03-22 LAB — POCT GLYCOSYLATED HEMOGLOBIN (HGB A1C): Hemoglobin A1C: 5.4 % (ref 4.0–5.6)

## 2022-03-22 LAB — GLUCOSE, CAPILLARY: Glucose-Capillary: 114 mg/dL — ABNORMAL HIGH (ref 70–99)

## 2022-03-22 NOTE — Progress Notes (Signed)
Established Patient Office Visit  Subjective   Patient ID: William Perkins, male    DOB: February 14, 1951  Age: 72 y.o. MRN: [id number]  Chief Complaint  Patient presents with   Check-up Visit   William Perkins presents today for follow-up of his multiple chronic medical conditions.  I have recently been assigned as his primary care physician and this is our first meeting.  We went over his medications and past medical history together.  He reports that he is feeling overall well.  We discussed multiple screening test and he has declined his philosophy is more to treat conditions that are present and causing symptoms to be too preventative in nature.  He is married retired he has 2 children 1 lives in [place] and the other in [place].  He is seen vein and vascular historically for PAD but has not seen them recently He sees Dr. Graylon Gunning for CKD.    Objective:     BP 132/66 (BP Location: Left Arm, Patient Position: Sitting, Cuff Size: Normal)   Pulse (!) 58   Temp 98.4 F (36.9 C) (Oral)   Ht '5\' 11"'$  (1.803 m)   Wt 138 lb 1.6 oz (62.6 kg)   SpO2 100% Comment: RA  BMI 19.26 kg/m  BP Readings from Last 3 Encounters:  03/22/22 132/66  08/17/21 132/66  03/09/21 97/66   Wt Readings from Last 3 Encounters:  03/22/22 138 lb 1.6 oz (62.6 kg)  08/17/21 141 lb 1.6 oz (64 kg)  03/09/21 142 lb 9.6 oz (64.7 kg)      Physical Exam   Results for orders placed or performed in visit on 03/22/22  Glucose, capillary  Result Value Ref Range   Glucose-Capillary 114 (H) 70 - 99 mg/dL  POC Hbg A1C  Result Value Ref Range   Hemoglobin A1C 5.4 4.0 - 5.6 %   HbA1c POC (<> result, manual entry)     HbA1c, POC (prediabetic range)     HbA1c, POC (controlled diabetic range)      Last CBC Lab Results  Component Value Date   WBC 11.7 (H) 05/21/2020   HGB 9.0 (L) 05/21/2020   HCT 27.1 (L) 05/21/2020   MCV 90.0 05/21/2020   MCH 29.9 05/21/2020   RDW 12.6 05/21/2020   PLT 249 76/72/0947   Last  metabolic panel Lab Results  Component Value Date   GLUCOSE 111 (H) 03/22/2022   NA 138 03/22/2022   K 4.5 03/22/2022   CL 104 03/22/2022   CO2 16 (L) 03/22/2022   BUN 25 03/22/2022   CREATININE 1.90 (H) 03/22/2022   EGFR 37 (L) 03/22/2022   CALCIUM 9.8 03/22/2022   PROT 6.5 05/20/2020   ALBUMIN 3.6 05/20/2020   LABGLOB 2.8 01/20/2015   AGRATIO 1.5 01/20/2015   BILITOT 0.6 05/20/2020   ALKPHOS 44 05/20/2020   AST 18 05/20/2020   ALT 17 05/20/2020   ANIONGAP 9 05/21/2020   Last lipids Lab Results  Component Value Date   CHOL 186 08/17/2021   HDL 58 08/17/2021   LDLCALC 109 (H) 08/17/2021   TRIG 109 08/17/2021   CHOLHDL 3.2 08/17/2021   Last hemoglobin A1c Lab Results  Component Value Date   HGBA1C 5.4 03/22/2022      The 10-year ASCVD risk score (Arnett DK, et al., 2019) is: 33.8%    Assessment & Plan:   Problem List Items Addressed This Visit       Cardiovascular and Mediastinum   PAD (peripheral artery disease) ([place])  Relevant Orders   VAS [place] ABI WITH/WO TBI     Respiratory   Other emphysema (HCC)   Relevant Orders   BMP8+Anion Gap     Endocrine   Type 2 diabetes mellitus with stage 3 chronic kidney disease, without long-term current use of insulin (HCC) - Primary   Relevant Orders   POC Hbg A1C (Completed)   BMP8+Anion Gap   Microalbumin / Creatinine Urine Ratio    No follow-ups on file.    Lucious Groves, DO

## 2022-03-23 LAB — BMP8+ANION GAP
Anion Gap: 18 mmol/L (ref 10.0–18.0)
BUN/Creatinine Ratio: 13 (ref 10–24)
BUN: 25 mg/dL (ref 8–27)
CO2: 16 mmol/L — ABNORMAL LOW (ref 20–29)
Calcium: 9.8 mg/dL (ref 8.6–10.2)
Chloride: 104 mmol/L (ref 96–106)
Creatinine, Ser: 1.9 mg/dL — ABNORMAL HIGH (ref 0.76–1.27)
Glucose: 111 mg/dL — ABNORMAL HIGH (ref 70–99)
Potassium: 4.5 mmol/L (ref 3.5–5.2)
Sodium: 138 mmol/L (ref 134–144)
eGFR: 37 mL/min/{1.73_m2} — ABNORMAL LOW (ref >59)

## 2022-03-23 MED ORDER — SODIUM BICARBONATE 650 MG PO TABS: 1300.0000 mg | ORAL_TABLET | Freq: Two times a day (BID) | ORAL | 1 refills | Status: DC

## 2022-03-23 NOTE — Assessment & Plan Note (Signed)
Has history of colon polyps he was recommended to have a repeat colonoscopy in 1 year after his colonoscopy in 2019 due to the size of the polyp that was removed the pandemic hit at this time and he never went for follow-up.  I discussed this with him today he thinks that since it has not bothered him since that time he does not see much interest in going forward he said it would be fine to bring this up in the future and to reconsider but at this time he is not interested.

## 2022-03-23 NOTE — Assessment & Plan Note (Signed)
Blood pressure control at goal reviewed his medications he is often present with him and reports good adherence.

## 2022-03-23 NOTE — Assessment & Plan Note (Addendum)
CKD is stable however I repeated a BMP and it appears that his bicarb has remained low we should start sodium bicarb. WIll start sodium bicarb '1300mg'$  BID

## 2022-03-23 NOTE — Assessment & Plan Note (Signed)
On review of his PAD it looks like there was a plan for repeat ABIs in 6 months after they were last obtained in 2022 I discussed with him he is overdue for this repeat and he is willing to go for reapeat.  He is adherent to daily aspirin and statin therapy.

## 2022-03-23 NOTE — Assessment & Plan Note (Signed)
He has a former smoker.  He has a history of emphysema he denies any shortness of breath today.

## 2022-03-23 NOTE — Assessment & Plan Note (Signed)
Several years ago he had uncontrolled diabetes since that time he has been well-controlled without medication.  I repeated an A1c today.

## 2022-04-15 NOTE — Progress Notes (Unsigned)
HISTORY AND PHYSICAL     CC:  follow up. Requesting Provider:  [name], DO  HPI: This is a 72 y.o. male who is here today for follow up for PAD.  Pt has hx of extensive right iliofemoral endarterectomy with bovine pericardial patch angioplasty by Dr. Lenell Antu on 05/20/2020 due to ischemic rest pain.    Pt was last seen 01/31/2021 and at that time, he was doing well without claudication, rest pain or non healing wounds.  He was compliant with his asa/statin.   The pt returns today for follow up.  ***  The pt is on a statin for cholesterol management.    The pt is on an aspirin.    Other AC:  none The pt is on ACEI, CC, diuretic, clonidine for hypertension.  The pt does not have diabetes. Tobacco hx:  former  Pt does not have family hx of AAA.  Past Medical History:  Diagnosis Date   Chronic renal insufficiency 03/21/2006   CrCl <50 ml   DDD (degenerative disc disease), cervical    GERD 03/22/2006   Qualifier: Diagnosis of  By: Danae Chen     GERD (gastroesophageal reflux disease) 03/22/2006   Hand numbness 10/07/2009   Hyperlipidemia 03/22/2008   Hypertension    Hypertension 03/22/2006   Left ventricular hypertrophy 03/22/2006   Leg pain, left 11/15/2009   Lumbar strain 07/15/2008   Renal insufficiency    Soft tissue disorder 08/2008   Tobacco abuse 03/22/2006   2 ppd x 30 years    Past Surgical History:  Procedure Laterality Date   ABDOMINAL AORTOGRAM W/LOWER EXTREMITY N/A 05/18/2020   Procedure: ABDOMINAL AORTOGRAM W/LOWER EXTREMITY;  Surgeon: Leonie Douglas, MD;  Location: [facility];  Service: Cardiovascular;  Laterality: N/A;   COLONOSCOPY     ENDARTERECTOMY FEMORAL Right 05/20/2020   Procedure: RIGHT EXTENDED FEMORAL ENDARTERECTOMY AND PROFUNDAPLASTY;  Surgeon: Leonie Douglas, MD;  Location: [facility] OR;  Service: Vascular;  Laterality: Right;   WISDOM TOOTH EXTRACTION      No Known Allergies  Current Outpatient Medications  Medication  Sig Dispense Refill   acetaminophen (TYLENOL) 500 MG tablet Take 1,000 mg by mouth every 6 (six) hours as needed for mild pain or moderate pain.     amLODipine (NORVASC) 10 MG tablet Take 1 tablet (10 mg total) by mouth daily. 90 tablet 3   aspirin EC 81 MG tablet Take 81 mg by mouth daily. Swallow whole.     cloNIDine (CATAPRES) 0.1 MG tablet Take 1 tablet (0.1 mg total) by mouth 2 (two) times daily. 180 tablet 3   hydrALAZINE (APRESOLINE) 100 MG tablet Take 0.5 tablets (50 mg total) by mouth 2 (two) times daily. 90 tablet 3   lisinopril (ZESTRIL) 40 MG tablet Take 1 tablet (40 mg total) by mouth daily. 90 tablet 1   Menthol, Topical Analgesic, (ICY HOT) 7.5 % (Roll) MISC Apply 1 each topically daily as needed (pain). (Patient not taking: Reported on 05/19/2021)     polyethylene glycol (MIRALAX / GLYCOLAX) 17 g packet Take 17 g by mouth 2 (two) times daily. (Patient taking differently: Take 17 g by mouth daily as needed for mild constipation or moderate constipation.) 14 each 25   rosuvastatin (CRESTOR) 20 MG tablet Take 1 tablet (20 mg total) by mouth daily. 90 tablet 3   sodium bicarbonate 650 MG tablet Take 2 tablets (1,300 mg total) by mouth 2 (two) times daily. 360 tablet 1   No  current facility-administered medications for this visit.    Family History  Problem Relation Age of Onset   Diabetes Mother    Diabetes Sister    Diabetes Brother    Heart disease Brother    Rectal cancer Neg Hx    Stomach cancer Neg Hx    Colon cancer Neg Hx     Social History   Socioeconomic History   Marital status: Married    Spouse name: Not on file   Number of children: Not on file   Years of education: 12   Highest education level: Not on file  Occupational History   Not on file  Tobacco Use   Smoking status: Former    Packs/day: 1.00    Years: 44.00    Total pack years: 44.00    Types: Cigarettes    Quit date: 11/21/2013    Years since quitting: 8.4   Smokeless tobacco: Never    Tobacco comments:    Stopped 11/21/2013   Vaping Use   Vaping Use: Never used  Substance and Sexual Activity   Alcohol use: No    Alcohol/week: 0.0 standard drinks of alcohol    Comment: Quit 2015   Drug use: No   Sexual activity: Yes    Partners: Female  Other Topics Concern   Not on file  Social History Narrative   Not on file   Social Determinants of Health   Financial Resource Strain: Medium Risk (05/19/2021)   Overall Financial Resource Strain (CARDIA)    Difficulty of Paying Living Expenses: Somewhat hard  Food Insecurity: Food Insecurity Present (05/19/2021)   Hunger Vital Sign    Worried About Running Out of Food in the Last Year: Never true    Ran Out of Food in the Last Year: Sometimes true  Transportation Needs: No Transportation Needs (05/19/2021)   PRAPARE - Administrator, Civil Service (Medical): No    Lack of Transportation (Non-Medical): No  Physical Activity: Inactive (05/19/2021)   Exercise Vital Sign    Days of Exercise per Week: 0 days    Minutes of Exercise per Session: 0 min  Stress: No Stress Concern Present (05/19/2021)   [employer] of Occupational Health - Occupational Stress Questionnaire    Feeling of Stress : Not at all  Social Connections: Moderately Isolated (05/19/2021)   Social Connection and Isolation Panel [NHANES]    Frequency of Communication with Friends and Family: Once a week    Frequency of Social Gatherings with Friends and Family: Once a week    Attends Religious Services: 1 to 4 times per year    Active Member of [employer] or Organizations: No    Attends Banker Meetings: Never    Marital Status: Married  Catering manager Violence: Not At Risk (05/19/2021)   Humiliation, Afraid, Rape, and Kick questionnaire    Fear of Current or Ex-Partner: No    Emotionally Abused: No    Physically Abused: No    Sexually Abused: No     REVIEW OF SYSTEMS:  *** [X]  denotes positive finding, [ ]  denotes negative  finding Cardiac  Comments:  Chest pain or chest pressure:    Shortness of breath upon exertion:    Short of breath when lying flat:    Irregular heart rhythm:        Vascular    Pain in calf, thigh, or hip brought on by ambulation:    Pain in feet at night that wakes you up from  your sleep:     Blood clot in your veins:    Leg swelling:         Pulmonary    Oxygen at home:    Productive cough:     Wheezing:         Neurologic    Sudden weakness in arms or legs:     Sudden numbness in arms or legs:     Sudden onset of difficulty speaking or slurred speech:    Temporary loss of vision in one eye:     Problems with dizziness:         Gastrointestinal    Blood in stool:     Vomited blood:         Genitourinary    Burning when urinating:     Blood in urine:        Psychiatric    Major depression:         Hematologic    Bleeding problems:    Problems with blood clotting too easily:        Skin    Rashes or ulcers:        Constitutional    Fever or chills:      PHYSICAL EXAMINATION:  ***  General:  WDWN in NAD; vital signs documented above Gait: Not observed HENT: WNL, normocephalic Pulmonary: normal non-labored breathing , without wheezing Cardiac: {Desc; regular/irreg:14544} HR, {With/Without:20273} carotid bruit*** Abdomen: soft, NT; aortic pulse is *** palpable Skin: {With/Without:20273} rashes Vascular Exam/Pulses:  Right Left  Radial {Exam; arterial pulse strength 0-4:30167} {Exam; arterial pulse strength 0-4:30167}  Femoral {Exam; arterial pulse strength 0-4:30167} {Exam; arterial pulse strength 0-4:30167}  Popliteal {Exam; arterial pulse strength 0-4:30167} {Exam; arterial pulse strength 0-4:30167}  DP {Exam; arterial pulse strength 0-4:30167} {Exam; arterial pulse strength 0-4:30167}  PT {Exam; arterial pulse strength 0-4:30167} {Exam; arterial pulse strength 0-4:30167}  Peroneal *** ***   Extremities: {With/Without:20273} ischemic changes,  {With/Without:20273} Gangrene , {With/Without:20273} cellulitis; {With/Without:20273} open wounds Musculoskeletal: no muscle wasting or atrophy  Neurologic: A&O X 3 Psychiatric:  The pt has {Desc; normal/abnormal:11317::"Normal"} affect.   Non-Invasive Vascular Imaging:   ABI's/TBI's on 04/17/2022: Right:  *** - Great toe pressure: *** Left:  *** - Great toe pressure: ***  Previous ABI's/TBI's on 01/31/2021: Right:  [place] - Great toe pressure: 26 Left:  0.71/0.23 - Great toe pressure:  28     ASSESSMENT/PLAN:: 72 y.o. male here for follow up for PAD with hx of extensive right iliofemoral endarterectomy with bovine pericardial patch angioplasty by Dr. Lenell Antu on 05/20/2020 due to ischemic rest pain.     -*** -continue statin/asa -pt will f/u in *** with ***.   Doreatha Massed, [facility] [phone]  Clinic MD:   Lenell Antu

## 2022-04-17 ENCOUNTER — Ambulatory Visit (INDEPENDENT_AMBULATORY_CARE_PROVIDER_SITE_OTHER): Payer: Medicare Other | Admitting: Vascular Surgery

## 2022-04-17 ENCOUNTER — Ambulatory Visit (HOSPITAL_COMMUNITY)
Admission: RE | Admit: 2022-04-17 | Discharge: 2022-04-17 | Disposition: A | Discharge status: Home / Self Care | Payer: Medicare Other | Source: Ambulatory Visit | Attending: Vascular Surgery | Admitting: Vascular Surgery

## 2022-04-17 ENCOUNTER — Encounter: Payer: Self-pay | Admitting: Vascular Surgery

## 2022-04-17 VITALS — BP 125/71 | HR 63 | Temp 98.1°F | Resp 20 | Ht 71.0 in | Wt 140.0 lb

## 2022-04-17 DIAGNOSIS — I739 Peripheral vascular disease, unspecified: Secondary | ICD-10-CM | POA: Diagnosis not present

## 2022-04-17 NOTE — Progress Notes (Signed)
[facility] OF [place] PROGRESS NOTE  ASSESSMENT / PLAN: William Perkins is a 72 y.o. male s/p  extensive right femoral endarterectomy for rest pain. He continues to be pain free. He should continue surveillance for PAD. I will see him again in a year with an ABI.   SUBJECTIVE: Doing well. No complaints. Walking without claudication. No rest pain symptoms. No ulcers.  OBJECTIVE: BP 125/71 (BP Location: Left Arm, Patient Position: Sitting, Cuff Size: Normal)   Pulse 63   Temp 98.1 F (36.7 C)   Resp 20   Ht 5' 11"$  (1.803 m)   Wt 140 lb (63.5 kg)   SpO2 99%   BMI 19.53 kg/m   No distress Regular rate and rhythm Unlabored breathing Warm feet without pedal pulses     Latest Ref Rng & Units 05/21/2020    1:40 AM 05/20/2020    5:57 AM 05/18/2020    7:07 AM  CBC  WBC 4.0 - 10.5 K/uL 11.7  7.2    Hemoglobin 13.0 - 17.0 g/dL 9.0  9.5  10.9   Hematocrit 39.0 - 52.0 % 27.1  29.3  32.0   Platelets 150 - 400 K/uL 249  251          Latest Ref Rng & Units 03/22/2022   11:52 AM 08/17/2021    4:31 PM 05/21/2020    1:40 AM  CMP  Glucose 70 - 99 mg/dL 111  112  205   BUN 8 - 27 mg/dL 25  30  34   Creatinine 0.76 - 1.27 mg/dL 1.90  1.98  2.35   Sodium 134 - 144 mmol/L 138  138  130   Potassium 3.5 - 5.2 mmol/L 4.5  4.4  4.9   Chloride 96 - 106 mmol/L 104  104  100   CO2 20 - 29 mmol/L 16  16  21   $ Calcium 8.6 - 10.2 mg/dL 9.8  10.0  8.8     CrCl cannot be calculated (Patient's most recent lab result is older than the maximum 21 days allowed.).   +-------+-----------+-----------+------------+------------+  ABI/TBIToday's ABIToday's TBIPrevious ABIPrevious TBI  +-------+-----------+-----------+------------+------------+  Right [place]         0.33       [place]          0.21          +-------+-----------+-----------+------------+------------+  Left  [place]         0.3        0.71        0.23           +-------+-----------+-----------+------------+------------+    Yevonne Aline. Stanford Breed, MD [facility] Phone Number: [phone] 04/17/2022 4:31 PM

## 2022-04-18 LAB — VAS US ABI WITH/WO TBI

## 2022-05-22 ENCOUNTER — Other Ambulatory Visit: Payer: Self-pay

## 2022-05-22 DIAGNOSIS — I10 Essential (primary) hypertension: Secondary | ICD-10-CM

## 2022-05-22 MED ORDER — LISINOPRIL 40 MG PO TABS: 40.0000 mg | ORAL_TABLET | Freq: Every day | ORAL | 1 refills | Status: DC

## 2022-06-25 ENCOUNTER — Telehealth: Payer: Self-pay | Admitting: Internal Medicine

## 2022-06-25 NOTE — Telephone Encounter (Signed)
Contacted William Perkins to schedule their annual wellness visit. Appointment made for 06/27/22.  [name] AWV direct phone # [phone]

## 2022-06-27 ENCOUNTER — Ambulatory Visit: Payer: Medicare Other

## 2022-06-27 NOTE — Progress Notes (Deleted)
Subjective:   William Perkins is a 72 y.o. male who presents for Medicare Annual/Subsequent preventive examination.  Review of Systems    ***       Objective:    There were no vitals filed for this visit. There is no height or weight on file to calculate BMI.     03/22/2022   11:18 AM 08/17/2021    3:46 PM 05/19/2021    2:18 PM 03/09/2021    1:19 PM 05/20/2020    6:44 AM 05/18/2020    6:45 AM 05/04/2020    2:26 PM  Advanced Directives  Does Patient Have a Medical Advance Directive? No No No No No No No  Would patient like information on creating a medical advance directive? No - Patient declined No - Patient declined No - Patient declined No - Patient declined No - Patient declined No - Patient declined No - Patient declined    Current Medications (verified) Outpatient Encounter Medications as of 06/27/2022  Medication Sig   acetaminophen (TYLENOL) 500 MG tablet Take 1,000 mg by mouth every 6 (six) hours as needed for mild pain or moderate pain.   amLODipine (NORVASC) 10 MG tablet Take 1 tablet (10 mg total) by mouth daily.   aspirin EC 81 MG tablet Take 81 mg by mouth daily. Swallow whole.   cloNIDine (CATAPRES) 0.1 MG tablet Take 1 tablet (0.1 mg total) by mouth 2 (two) times daily.   hydrALAZINE (APRESOLINE) 50 MG tablet Take 50 mg by mouth 2 (two) times daily.   lisinopril (ZESTRIL) 40 MG tablet Take 1 tablet (40 mg total) by mouth daily.   Menthol, Topical Analgesic, (ICY HOT) 7.5 % (Roll) MISC Apply 1 each topically daily as needed (pain).   polyethylene glycol (MIRALAX / GLYCOLAX) 17 g packet Take 17 g by mouth 2 (two) times daily. (Patient taking differently: Take 17 g by mouth daily as needed for mild constipation or moderate constipation.)   rosuvastatin (CRESTOR) 20 MG tablet Take 1 tablet (20 mg total) by mouth daily.   sodium bicarbonate 650 MG tablet Take 2 tablets (1,300 mg total) by mouth 2 (two) times daily.   No facility-administered encounter medications on file  as of 06/27/2022.    Allergies (verified) Patient has no known allergies.   History: Past Medical History:  Diagnosis Date   Chronic renal insufficiency 03/21/2006   CrCl <50 ml   DDD (degenerative disc disease), cervical    GERD 03/22/2006   Qualifier: Diagnosis of  By: Danae Chen     GERD (gastroesophageal reflux disease) 03/22/2006   Hand numbness 10/07/2009   Hyperlipidemia 03/22/2008   Hypertension    Hypertension 03/22/2006   Left ventricular hypertrophy 03/22/2006   Leg pain, left 11/15/2009   Lumbar strain 07/15/2008   Renal insufficiency    Soft tissue disorder 08/2008   Tobacco abuse 03/22/2006   2 ppd x 30 years   Past Surgical History:  Procedure Laterality Date   ABDOMINAL AORTOGRAM W/LOWER EXTREMITY N/A 05/18/2020   Procedure: ABDOMINAL AORTOGRAM W/LOWER EXTREMITY;  Surgeon: Leonie Douglas, MD;  Location: [facility];  Service: Cardiovascular;  Laterality: N/A;   COLONOSCOPY     ENDARTERECTOMY FEMORAL Right 05/20/2020   Procedure: RIGHT EXTENDED FEMORAL ENDARTERECTOMY AND PROFUNDAPLASTY;  Surgeon: Leonie Douglas, MD;  Location: [facility] OR;  Service: Vascular;  Laterality: Right;   WISDOM TOOTH EXTRACTION     Family History  Problem Relation Age of Onset   Diabetes Mother  Diabetes Sister    Diabetes Brother    Heart disease Brother    Rectal cancer Neg Hx    Stomach cancer Neg Hx    Colon cancer Neg Hx    Social History   Socioeconomic History   Marital status: Married    Spouse name: Not on file   Number of children: Not on file   Years of education: 12   Highest education level: Not on file  Occupational History   Not on file  Tobacco Use   Smoking status: Former    Packs/day: 1.00    Years: 44.00    Additional pack years: 0.00    Total pack years: 44.00    Types: Cigarettes    Quit date: 11/21/2013    Years since quitting: 8.6   Smokeless tobacco: Never   Tobacco comments:    Stopped 11/21/2013   Vaping Use   Vaping Use:  Never used  Substance and Sexual Activity   Alcohol use: No    Alcohol/week: 0.0 standard drinks of alcohol    Comment: Quit 2015   Drug use: No   Sexual activity: Yes    Partners: Female  Other Topics Concern   Not on file  Social History Narrative   Not on file   Social Determinants of Health   Financial Resource Strain: Medium Risk (05/19/2021)   Overall Financial Resource Strain (CARDIA)    Difficulty of Paying Living Expenses: Somewhat hard  Food Insecurity: Food Insecurity Present (05/19/2021)   Hunger Vital Sign    Worried About Running Out of Food in the Last Year: Never true    Ran Out of Food in the Last Year: Sometimes true  Transportation Needs: No Transportation Needs (05/19/2021)   PRAPARE - Administrator, Civil Service (Medical): No    Lack of Transportation (Non-Medical): No  Physical Activity: Inactive (05/19/2021)   Exercise Vital Sign    Days of Exercise per Week: 0 days    Minutes of Exercise per Session: 0 min  Stress: No Stress Concern Present (05/19/2021)   [employer] of Occupational Health - Occupational Stress Questionnaire    Feeling of Stress : Not at all  Social Connections: Moderately Isolated (05/19/2021)   Social Connection and Isolation Panel [NHANES]    Frequency of Communication with Friends and Family: Once a week    Frequency of Social Gatherings with Friends and Family: Once a week    Attends Religious Services: 1 to 4 times per year    Active Member of [employer] or Organizations: No    Attends Banker Meetings: Never    Marital Status: Married    Tobacco Counseling Counseling given: Not Answered Tobacco comments: Stopped 11/21/2013    Clinical Intake:                 Diabetic?***         Activities of Daily Living    03/22/2022   11:17 AM 08/17/2021    3:40 PM  In your present state of health, do you have any difficulty performing the following activities:  Hearing? 0 0  Vision? 0 0   Difficulty concentrating or making decisions? 0 0  Walking or climbing stairs? 0 0  Dressing or bathing? 0 0  Doing errands, shopping? 0 0    Patient Care Team: [name], DO as PCP - General (Internal Medicine)  Indicate any recent Medical Services you may have received from other than [facility] providers in the  past year (date may be approximate).     Assessment:   This is a routine wellness examination for Gray.  Hearing/Vision screen No results found.  Dietary issues and exercise activities discussed:     Goals Addressed   None    Depression Screen    03/22/2022   11:17 AM 08/17/2021    3:47 PM 05/19/2021    2:17 PM 03/09/2021    1:19 PM 05/04/2020    2:56 PM 04/07/2020    1:29 PM 11/16/2019   11:01 AM  PHQ 2/9 Scores  PHQ - 2 Score 0 0 0 0 0 0 0  PHQ- 9 Score   2        Fall Risk    03/22/2022   11:17 AM 08/17/2021    3:40 PM 05/19/2021    2:16 PM 03/09/2021    1:19 PM 05/04/2020    2:26 PM  Fall Risk   Falls in the past year? 0 0 0 0 0  Number falls in past yr: 0 0 0 0 0  Injury with Fall? 0 0 0 0 0  Risk for fall due to : No Fall Risks No Fall Risks No Fall Risks No Fall Risks No Fall Risks  Follow up Falls evaluation completed;Falls prevention discussed Falls evaluation completed;Falls prevention discussed Falls evaluation completed Falls evaluation completed;Falls prevention discussed Falls evaluation completed    FALL RISK PREVENTION PERTAINING TO THE HOME:  Any stairs in or around the home? {YES/NO:21197} If so, are there any without handrails? {YES/NO:21197} Home free of loose throw rugs in walkways, pet beds, electrical cords, etc? {YES/NO:21197} Adequate lighting in your home to reduce risk of falls? {YES/NO:21197}  ASSISTIVE DEVICES UTILIZED TO PREVENT FALLS:  Life alert? {YES/NO:21197} Use of a cane, walker or w/c? {YES/NO:21197} Grab bars in the bathroom? {YES/NO:21197} Shower chair or bench in shower? {YES/NO:21197} Elevated toilet seat or a  handicapped toilet? {YES/NO:21197}  TIMED UP AND GO:  Was the test performed? {YES/NO:21197}.  Length of time to ambulate 10 feet: *** sec.   {Appearance of ZOXW:9604540}  Cognitive Function:        05/19/2021    2:51 PM  6CIT Screen  What Year? 0 points  What month? 0 points  What time? 0 points  Count back from 20 0 points  Months in reverse 0 points  Repeat phrase 0 points  Total Score 0 points    Immunizations Immunization History  Administered Date(s) Administered   Influenza Whole 12/03/2008   PFIZER(Purple Top)SARS-COV-2 Vaccination 05/26/2019, 06/16/2019    {TDAP status:2101805}  {Flu Vaccine status:2101806}  {Pneumococcal vaccine status:2101807}  {Covid-19 vaccine status:2101808}  Qualifies for Shingles Vaccine? {YES/NO:21197}  Zostavax completed {YES/NO:21197}  {Shingrix Completed?:2101804}  Screening Tests Health Maintenance  Topic Date Due   DTaP/Tdap/Td (1 - Tdap) Never done   Zoster Vaccines- Shingrix (1 of 2) Never done   OPHTHALMOLOGY EXAM  05/18/2016   FOOT EXAM  06/07/2018   Diabetic kidney evaluation - Urine ACR  09/02/2020   COVID-19 Vaccine (3 - 2023-24 season) 11/03/2021   Medicare Annual Wellness (AWV)  05/20/2022   HEMOGLOBIN A1C  06/21/2022   Lung Cancer Screening  03/23/2023 (Originally 04/03/2000)   Pneumonia Vaccine 55+ Years old (1 of 2 - PCV) 03/23/2023 (Originally 04/03/1956)   COLONOSCOPY (Pts 45-18yrs Insurance coverage will need to be confirmed)  03/23/2023 (Originally 01/28/2019)   INFLUENZA VACCINE  10/04/2022   Diabetic kidney evaluation - eGFR measurement  03/23/2023   Hepatitis C Screening  Completed  HPV VACCINES  Aged Out    Health Maintenance  Health Maintenance Due  Topic Date Due   DTaP/Tdap/Td (1 - Tdap) Never done   Zoster Vaccines- Shingrix (1 of 2) Never done   OPHTHALMOLOGY EXAM  05/18/2016   FOOT EXAM  06/07/2018   Diabetic kidney evaluation - Urine ACR  09/02/2020   COVID-19 Vaccine (3 - 2023-24  season) 11/03/2021   Medicare Annual Wellness (AWV)  05/20/2022   HEMOGLOBIN A1C  06/21/2022    {Colorectal cancer screening:2101809}  Lung Cancer Screening: (Low Dose CT Chest recommended if Age 23-80 years, 30 pack-year currently smoking OR have quit w/in 15years.) {DOES NOT does:27190::"does not"} qualify.   Lung Cancer Screening Referral: ***  Additional Screening:  Hepatitis C Screening: {DOES NOT does:27190::"does not"} qualify; Completed ***  Vision Screening: Recommended annual ophthalmology exams for early detection of glaucoma and other disorders of the eye. Is the patient up to date with their annual eye exam?  {YES/NO:21197} Who is the provider or what is the name of the office in which the patient attends annual eye exams? *** If pt is not established with a provider, would they like to be referred to a provider to establish care? {YES/NO:21197}.   Dental Screening: Recommended annual dental exams for proper oral hygiene  Community Resource Referral / Chronic Care Management: CRR required this visit?  {YES/NO:21197}  CCM required this visit?  {YES/NO:21197}     Plan:     I have personally reviewed and noted the following in the patient's chart:   Medical and social history Use of alcohol, tobacco or illicit drugs  Current medications and supplements including opioid prescriptions. {Opioid Prescriptions:[phone]} Functional ability and status Nutritional status Physical activity Advanced directives List of other physicians Hospitalizations, surgeries, and ER visits in previous 12 months Vitals Screenings to include cognitive, depression, and falls Referrals and appointments  In addition, I have reviewed and discussed with patient certain preventive protocols, quality metrics, and best practice recommendations. A written personalized care plan for preventive services as well as general preventive health recommendations were provided to patient.      Milus Mallick, CMA   06/27/2022   Nurse Notes: ***

## 2022-08-17 DIAGNOSIS — I129 Hypertensive chronic kidney disease with stage 1 through stage 4 chronic kidney disease, or unspecified chronic kidney disease: Secondary | ICD-10-CM | POA: Diagnosis not present

## 2022-08-17 DIAGNOSIS — E559 Vitamin D deficiency, unspecified: Secondary | ICD-10-CM | POA: Diagnosis not present

## 2022-08-17 DIAGNOSIS — N1832 Chronic kidney disease, stage 3b: Secondary | ICD-10-CM | POA: Diagnosis not present

## 2022-08-17 DIAGNOSIS — D631 Anemia in chronic kidney disease: Secondary | ICD-10-CM | POA: Diagnosis not present

## 2022-08-20 LAB — LAB REPORT - SCANNED: EGFR: 27

## 2022-09-05 ENCOUNTER — Other Ambulatory Visit: Payer: Self-pay

## 2022-09-05 DIAGNOSIS — E782 Mixed hyperlipidemia: Secondary | ICD-10-CM

## 2022-09-05 MED ORDER — ROSUVASTATIN CALCIUM 20 MG PO TABS: 20.0000 mg | ORAL_TABLET | Freq: Every day | ORAL | 3 refills | Status: DC

## 2022-09-13 ENCOUNTER — Telehealth: Payer: Self-pay | Admitting: Dietician

## 2022-09-13 NOTE — Telephone Encounter (Signed)
[name] says he wants to make his own diabetes eye exam at [facility] on [address]. He agreed to tell us when it is so we can request the report.

## 2022-09-19 ENCOUNTER — Ambulatory Visit (INDEPENDENT_AMBULATORY_CARE_PROVIDER_SITE_OTHER): Payer: Medicare Other

## 2022-09-19 ENCOUNTER — Encounter: Payer: Medicare Other | Admitting: Student

## 2022-09-19 VITALS — BP 137/62 | HR 65 | Ht 71.0 in | Wt 142.2 lb

## 2022-09-19 DIAGNOSIS — Z0000 Encounter for general adult medical examination without abnormal findings: Secondary | ICD-10-CM | POA: Diagnosis not present

## 2022-09-19 NOTE — Progress Notes (Signed)
Subjective:   William Perkins is a 72 y.o. male who presents for Medicare Annual/Subsequent preventive examination.  Visit Complete: In person   Review of Systems    Cardiac Risk Factors include: advanced age (>55men, >22 women);male gender;hypertension;diabetes mellitus;Other (see comment);dyslipidemia, Risk factor comments: PAD, CKD     Objective:    Today's Vitals   09/19/22 0818 09/19/22 0854  BP: (!) 87/68 137/62  Pulse: 65   Weight: 142 lb 3.2 oz (64.5 kg)   Height: 5\' 11"  (1.803 m)    Body mass index is 19.83 kg/m.     09/19/2022    8:33 AM 03/22/2022   11:18 AM 08/17/2021    3:46 PM 05/19/2021    2:18 PM 03/09/2021    1:19 PM 05/20/2020    6:44 AM 05/18/2020    6:45 AM  Advanced Directives  Does Patient Have a Medical Advance Directive? No No No No No No No  Would patient like information on creating a medical advance directive? No - Patient declined No - Patient declined No - Patient declined No - Patient declined No - Patient declined No - Patient declined No - Patient declined    Current Medications (verified) Outpatient Encounter Medications as of 09/19/2022  Medication Sig   acetaminophen (TYLENOL) 500 MG tablet Take 1,000 mg by mouth every 6 (six) hours as needed for mild pain or moderate pain.   amLODipine (NORVASC) 10 MG tablet Take 1 tablet (10 mg total) by mouth daily.   aspirin EC 81 MG tablet Take 81 mg by mouth daily. Swallow whole.   hydrALAZINE (APRESOLINE) 50 MG tablet Take 50 mg by mouth 2 (two) times daily.   lisinopril (ZESTRIL) 40 MG tablet Take 1 tablet (40 mg total) by mouth daily.   Menthol, Topical Analgesic, (ICY HOT) 7.5 % (Roll) MISC Apply 1 each topically daily as needed (pain).   polyethylene glycol (MIRALAX / GLYCOLAX) 17 g packet Take 17 g by mouth 2 (two) times daily. (Patient taking differently: Take 17 g by mouth daily as needed for mild constipation or moderate constipation.)   rosuvastatin (CRESTOR) 20 MG tablet Take 1 tablet (20 mg  total) by mouth daily.   sodium bicarbonate 650 MG tablet Take 2 tablets (1,300 mg total) by mouth 2 (two) times daily.   cloNIDine (CATAPRES) 0.1 MG tablet Take 1 tablet (0.1 mg total) by mouth 2 (two) times daily.   No facility-administered encounter medications on file as of 09/19/2022.    Allergies (verified) Patient has no known allergies.   History: Past Medical History:  Diagnosis Date   Chronic renal insufficiency 03/21/2006   CrCl <50 ml   DDD (degenerative disc disease), cervical    GERD 03/22/2006   Qualifier: Diagnosis of  By: Danae Chen     GERD (gastroesophageal reflux disease) 03/22/2006   Hand numbness 10/07/2009   Hyperlipidemia 03/22/2008   Hypertension    Hypertension 03/22/2006   Left ventricular hypertrophy 03/22/2006   Leg pain, left 11/15/2009   Lumbar strain 07/15/2008   Renal insufficiency    Soft tissue disorder 08/2008   Tobacco abuse 03/22/2006   2 ppd x 30 years   Past Surgical History:  Procedure Laterality Date   ABDOMINAL AORTOGRAM W/LOWER EXTREMITY N/A 05/18/2020   Procedure: ABDOMINAL AORTOGRAM W/LOWER EXTREMITY;  Surgeon: Leonie Douglas, MD;  Location: [facility];  Service: Cardiovascular;  Laterality: N/A;   COLONOSCOPY     ENDARTERECTOMY FEMORAL Right 05/20/2020   Procedure: RIGHT EXTENDED  FEMORAL ENDARTERECTOMY AND PROFUNDAPLASTY;  Surgeon: Leonie Douglas, MD;  Location: [facility] OR;  Service: Vascular;  Laterality: Right;   WISDOM TOOTH EXTRACTION     Family History  Problem Relation Age of Onset   Diabetes Mother    Diabetes Sister    Diabetes Brother    Heart disease Brother    Rectal cancer Neg Hx    Stomach cancer Neg Hx    Colon cancer Neg Hx    Social History   Socioeconomic History   Marital status: Married    Spouse name: Anette   Number of children: 0   Years of education: 12   Highest education level: Not on file  Occupational History   Occupation: Retired  Tobacco Use   Smoking status: Former     Current packs/day: 0.00    Average packs/day: 1 pack/day for 44.0 years (44.0 ttl pk-yrs)    Types: Cigarettes    Start date: 11/21/1969    Quit date: 11/21/2013    Years since quitting: 8.8   Smokeless tobacco: Never   Tobacco comments:    Stopped 11/21/2013   Vaping Use   Vaping status: Never Used  Substance and Sexual Activity   Alcohol use: No    Alcohol/week: 0.0 standard drinks of alcohol    Comment: Quit 2015   Drug use: No   Sexual activity: Yes    Partners: Female  Other Topics Concern   Not on file  Social History Narrative   Not on file   Social Determinants of Health   Financial Resource Strain: Low Risk  (09/19/2022)   Overall Financial Resource Strain (CARDIA)    Difficulty of Paying Living Expenses: Not very hard  Food Insecurity: No Food Insecurity (09/19/2022)   Hunger Vital Sign    Worried About Running Out of Food in the Last Year: Never true    Ran Out of Food in the Last Year: Never true  Transportation Needs: No Transportation Needs (09/19/2022)   PRAPARE - Administrator, Civil Service (Medical): No    Lack of Transportation (Non-Medical): No  Physical Activity: Insufficiently Active (09/19/2022)   Exercise Vital Sign    Days of Exercise per Week: 7 days    Minutes of Exercise per Session: 10 min  Stress: No Stress Concern Present (09/19/2022)   [employer] of Occupational Health - Occupational Stress Questionnaire    Feeling of Stress : Not at all  Social Connections: Moderately Isolated (09/19/2022)   Social Connection and Isolation Panel [NHANES]    Frequency of Communication with Friends and Family: Once a week    Frequency of Social Gatherings with Friends and Family: Three times a week    Attends Religious Services: Never    Active Member of Clubs or Organizations: No    Attends Banker Meetings: Never    Marital Status: Married    Tobacco Counseling Counseling given: Not Answered Tobacco comments: Stopped  11/21/2013    Clinical Intake:  Pre-visit preparation completed: Yes  Pain : No/denies pain     BMI - recorded: 19.83 Nutritional Status: BMI <19  Underweight Nutritional Risks: None Diabetes: Yes Did pt. bring in CBG monitor from home?: No  How often do you need to have someone help you when you read instructions, pamphlets, or other written materials from your doctor or pharmacy?: 1 - Never  Interpreter Needed?: No  Information entered by :: Airis Barbee, RMA   Activities of Daily Living    09/19/2022  9:54 AM 03/22/2022   11:17 AM  In your present state of health, do you have any difficulty performing the following activities:  Hearing? 0 0  Vision? 0 0  Difficulty concentrating or making decisions? 0 0  Walking or climbing stairs? 0 0  Dressing or bathing? 0 0  Doing errands, shopping? 0 0  Preparing Food and eating ? N   Using the Toilet? N   In the past six months, have you accidently leaked urine? N   Do you have problems with loss of bowel control? N   Managing your Medications? N   Managing your Finances? N   Housekeeping or managing your Housekeeping? N     Patient Care Team: [name], DO as PCP - General (Internal Medicine)  Indicate any recent Medical Services you may have received from other than [facility] providers in the past year (date may be approximate).     Assessment:   This is a routine wellness examination for [place].  Hearing/Vision screen Hearing Screening - Comments:: Denies hearing difficulties   Vision Screening - Comments:: Wears eyeglasses.  Dietary issues and exercise activities discussed:     Goals Addressed               This Visit's Progress     Blood Pressure < 140/90 (pt-stated)   137/62     I just want to live a healthy lifestyle.      Depression Screen    09/19/2022    8:40 AM 03/22/2022   11:17 AM 08/17/2021    3:47 PM 05/19/2021    2:17 PM 03/09/2021    1:19 PM 05/04/2020    2:56 PM 04/07/2020    1:29  PM  PHQ 2/9 Scores  PHQ - 2 Score 0 0 0 0 0 0 0  PHQ- 9 Score 0   2       Fall Risk    09/19/2022    8:33 AM 03/22/2022   11:17 AM 08/17/2021    3:40 PM 05/19/2021    2:16 PM 03/09/2021    1:19 PM  Fall Risk   Falls in the past year? 0 0 0 0 0  Number falls in past yr: 0 0 0 0 0  Injury with Fall? 0 0 0 0 0  Risk for fall due to : No Fall Risks No Fall Risks No Fall Risks No Fall Risks No Fall Risks  Follow up Falls prevention discussed Falls evaluation completed;Falls prevention discussed Falls evaluation completed;Falls prevention discussed Falls evaluation completed Falls evaluation completed;Falls prevention discussed    MEDICARE RISK AT HOME:  Medicare Risk at Home - 09/19/22 1478     Any stairs in or around the home? No    If so, are there any without handrails? No    Home free of loose throw rugs in walkways, pet beds, electrical cords, etc? Yes    Adequate lighting in your home to reduce risk of falls? Yes    Life alert? No    Use of a cane, walker or w/c? Yes    Grab bars in the bathroom? No    Shower chair or bench in shower? No    Elevated toilet seat or a handicapped toilet? No             TIMED UP AND GO:  Was the test performed?  Yes  Length of time to ambulate 10 feet: 10 sec Gait steady and fast without use of assistive device  Cognitive Function:        09/19/2022    8:34 AM 05/19/2021    2:51 PM  6CIT Screen  What Year? 0 points 0 points  What month? 0 points 0 points  What time? 0 points 0 points  Count back from 20 0 points 0 points  Months in reverse 4 points 0 points  Repeat phrase 10 points 0 points  Total Score 14 points 0 points    Immunizations Immunization History  Administered Date(s) Administered   Influenza Whole 12/03/2008   PFIZER(Purple Top)SARS-COV-2 Vaccination 05/26/2019, 06/16/2019    TDAP status: Due, Education has been provided regarding the importance of this vaccine. Advised may receive this vaccine at local  pharmacy or Health Dept. Aware to provide a copy of the vaccination record if obtained from local pharmacy or Health Dept. Verbalized acceptance and understanding.  Flu Vaccine status: Declined, Education has been provided regarding the importance of this vaccine but patient still declined. Advised may receive this vaccine at local pharmacy or Health Dept. Aware to provide a copy of the vaccination record if obtained from local pharmacy or Health Dept. Verbalized acceptance and understanding.  Pneumococcal vaccine status: Declined,  Education has been provided regarding the importance of this vaccine but patient still declined. Advised may receive this vaccine at local pharmacy or Health Dept. Aware to provide a copy of the vaccination record if obtained from local pharmacy or Health Dept. Verbalized acceptance and understanding.   Covid-19 vaccine status: Completed vaccines  Qualifies for Shingles Vaccine? Yes   Zostavax completed No   Shingrix Completed?: No.    Education has been provided regarding the importance of this vaccine. Patient has been advised to call insurance company to determine out of pocket expense if they have not yet received this vaccine. Advised may also receive vaccine at local pharmacy or Health Dept. Verbalized acceptance and understanding.  Screening Tests Health Maintenance  Topic Date Due   DTaP/Tdap/Td (1 - Tdap) Never done   Zoster Vaccines- Shingrix (1 of 2) Never done   OPHTHALMOLOGY EXAM  05/18/2016   FOOT EXAM  06/07/2018   Diabetic kidney evaluation - Urine ACR  09/02/2020   COVID-19 Vaccine (3 - 2023-24 season) 11/03/2021   HEMOGLOBIN A1C  06/21/2022   Lung Cancer Screening  03/23/2023 (Originally 04/03/2000)   Pneumonia Vaccine 82+ Years old (1 of 2 - PCV) 03/23/2023 (Originally 04/03/1956)   Colonoscopy  03/23/2023 (Originally 01/28/2019)   INFLUENZA VACCINE  10/04/2022   Diabetic kidney evaluation - eGFR measurement  08/20/2023   Medicare Annual  Wellness (AWV)  09/19/2023   Hepatitis C Screening  Completed   HPV VACCINES  Aged Out    Health Maintenance  Health Maintenance Due  Topic Date Due   DTaP/Tdap/Td (1 - Tdap) Never done   Zoster Vaccines- Shingrix (1 of 2) Never done   OPHTHALMOLOGY EXAM  05/18/2016   FOOT EXAM  06/07/2018   Diabetic kidney evaluation - Urine ACR  09/02/2020   COVID-19 Vaccine (3 - 2023-24 season) 11/03/2021   HEMOGLOBIN A1C  06/21/2022    Colorectal cancer screening: Type of screening: Colonoscopy. Completed 01/27/2018. Repeat every 10 years  Lung Cancer Screening: (Low Dose CT Chest recommended if Age 54-80 years, 20 pack-year currently smoking OR have quit w/in 15years.) does not qualify.   Lung Cancer Screening Referral: N/A  Additional Screening:  Hepatitis C Screening: does qualify; Completed 01/20/2015  Vision Screening: Recommended annual ophthalmology exams for early detection of glaucoma and other disorders of  the eye. Is the patient up to date with their annual eye exam?  Yes  Who is the provider or what is the name of the office in which the patient attends annual eye exams? Erick Alley, ([facility]) If pt is not established with a provider, would they like to be referred to a provider to establish care? No .   Dental Screening: Recommended annual dental exams for proper oral hygiene  Diabetic Foot Exam: Diabetic Foot Exam: Overdue, Pt has been advised about the importance in completing this exam. Pt is scheduled for diabetic foot exam on N/A.  Community Resource Referral / Chronic Care Management: CRR required this visit?  No   CCM required this visit?  No    Plan:     I have personally reviewed and noted the following in the patient's chart:   Medical and social history Use of alcohol, tobacco or illicit drugs  Current medications and supplements including opioid prescriptions. Patient is not currently taking opioid prescriptions. Functional ability and  status Nutritional status Physical activity Advanced directives List of other physicians Hospitalizations, surgeries, and ER visits in previous 12 months Vitals Screenings to include cognitive, depression, and falls Referrals and appointments  In addition, I have reviewed and discussed with patient certain preventive protocols, quality metrics, and best practice recommendations. A written personalized care plan for preventive services as well as general preventive health recommendations were provided to patient.     Dequavius Kuhner L Mikal Blasdell, CMA   09/19/2022   After Visit Summary: (MyChart) Due to this being a telephonic visit, the after visit summary with patients personalized plan was offered to patient via MyChart   Nurse Notes: I have reviewed and discussed with patient certain preventive protocols, quality metrics, and best practice recommendations. A written personalized care plan for preventive services as well as general preventive health recommendations were provided to patient.

## 2022-09-19 NOTE — Patient Instructions (Addendum)
William Perkins , Thank you for taking time to come for your Medicare Wellness Visit. I appreciate your ongoing commitment to your health goals. Please review the following plan we discussed and let me know if I can assist you in the future.   These are the goals we discussed:  Goals       Blood Pressure < 140/90 (pt-stated)      I just want to live a healthy lifestyle.        This is a list of the screening recommended for you and due dates:  Health Maintenance  Topic Date Due   DTaP/Tdap/Td vaccine (1 - Tdap) Never done   Zoster (Shingles) Vaccine (1 of 2) Never done   Eye exam for diabetics  05/18/2016   Complete foot exam   06/07/2018   Yearly kidney health urinalysis for diabetes  09/02/2020   COVID-19 Vaccine (3 - 2023-24 season) 11/03/2021   Hemoglobin A1C  06/21/2022   Screening for Lung Cancer  03/23/2023*   Pneumonia Vaccine (1 of 2 - PCV) 03/23/2023*   Colon Cancer Screening  03/23/2023*   Flu Shot  10/04/2022   Yearly kidney function blood test for diabetes  08/20/2023   Medicare Annual Wellness Visit  09/19/2023   Hepatitis C Screening  Completed   HPV Vaccine  Aged Out  *Topic was postponed. The date shown is not the original due date.    Advanced directives: Advance directive discussed with you today. Even though you declined this today, please call our office should you change your mind, and we can give you the proper paperwork for you to fill out.   Conditions/risks identified: It was nice to meet you, William Perkins and thank you for attending this appointment today.  Each day, aim for 6 glasses of water, plenty of protein in your diet and try to get up and walk/ stretch every hour for 5-10 minutes at a time.    Next appointment: Follow up in one year for your annual wellness visit.  Preventive Care 72 Years and Older, Male  Preventive care refers to lifestyle choices and visits with your health care provider that can promote health and wellness. What does preventive  care include? A yearly physical exam. This is also called an annual well check. Dental exams once or twice a year. Routine eye exams. Ask your health care provider how often you should have your eyes checked. Personal lifestyle choices, including: Daily care of your teeth and gums. Regular physical activity. Eating a healthy diet. Avoiding tobacco and drug use. Limiting alcohol use. Practicing safe sex. Taking low doses of aspirin every day. Taking vitamin and mineral supplements as recommended by your health care provider. What happens during an annual well check? The services and screenings done by your health care provider during your annual well check will depend on your age, overall health, lifestyle risk factors, and family history of disease. Counseling  Your health care provider may ask you questions about your: Alcohol use. Tobacco use. Drug use. Emotional well-being. Home and relationship well-being. Sexual activity. Eating habits. History of falls. Memory and ability to understand (cognition). Work and work Astronomer. Screening  You may have the following tests or measurements: Height, weight, and BMI. Blood pressure. Lipid and cholesterol levels. These may be checked every 5 years, or more frequently if you are over 80 years old. Skin check. Lung cancer screening. You may have this screening every year starting at age 79 if you have a 30-pack-year history  of smoking and currently smoke or have quit within the past 15 years. Fecal occult blood test (FOBT) of the stool. You may have this test every year starting at age 2. Flexible sigmoidoscopy or colonoscopy. You may have a sigmoidoscopy every 5 years or a colonoscopy every 10 years starting at age 67. Prostate cancer screening. Recommendations will vary depending on your family history and other risks. Hepatitis C blood test. Hepatitis B blood test. Sexually transmitted disease (STD) testing. Diabetes screening.  This is done by checking your blood sugar (glucose) after you have not eaten for a while (fasting). You may have this done every 1-3 years. Abdominal aortic aneurysm (AAA) screening. You may need this if you are a current or former smoker. Osteoporosis. You may be screened starting at age 47 if you are at high risk. Talk with your health care provider about your test results, treatment options, and if necessary, the need for more tests. Vaccines  Your health care provider may recommend certain vaccines, such as: Influenza vaccine. This is recommended every year. Tetanus, diphtheria, and acellular pertussis (Tdap, Td) vaccine. You may need a Td booster every 10 years. Zoster vaccine. You may need this after age 66. Pneumococcal 13-valent conjugate (PCV13) vaccine. One dose is recommended after age 59. Pneumococcal polysaccharide (PPSV23) vaccine. One dose is recommended after age 72. Talk to your health care provider about which screenings and vaccines you need and how often you need them. This information is not intended to replace advice given to you by your health care provider. Make sure you discuss any questions you have with your health care provider. Document Released: 03/18/2015 Document Revised: 11/09/2015 Document Reviewed: 12/21/2014 Elsevier Interactive Patient Education  2017 [employer].  Fall Prevention in the Home Falls can cause injuries. They can happen to people of all ages. There are many things you can do to make your home safe and to help prevent falls. What can I do on the outside of my home? Regularly fix the edges of walkways and driveways and fix any cracks. Remove anything that might make you trip as you walk through a door, such as a raised step or threshold. Trim any bushes or trees on the path to your home. Use bright outdoor lighting. Clear any walking paths of anything that might make someone trip, such as rocks or tools. Regularly check to see if handrails  are loose or broken. Make sure that both sides of any steps have handrails. Any raised decks and porches should have guardrails on the edges. Have any leaves, snow, or ice cleared regularly. Use sand or salt on walking paths during winter. Clean up any spills in your garage right away. This includes oil or grease spills. What can I do in the bathroom? Use night lights. Install grab bars by the toilet and in the tub and shower. Do not use towel bars as grab bars. Use non-skid mats or decals in the tub or shower. If you need to sit down in the shower, use a plastic, non-slip stool. Keep the floor dry. Clean up any water that spills on the floor as soon as it happens. Remove soap buildup in the tub or shower regularly. Attach bath mats securely with double-sided non-slip rug tape. Do not have throw rugs and other things on the floor that can make you trip. What can I do in the bedroom? Use night lights. Make sure that you have a light by your bed that is easy to reach. Do not  use any sheets or blankets that are too big for your bed. They should not hang down onto the floor. Have a firm chair that has side arms. You can use this for support while you get dressed. Do not have throw rugs and other things on the floor that can make you trip. What can I do in the kitchen? Clean up any spills right away. Avoid walking on wet floors. Keep items that you use a lot in easy-to-reach places. If you need to reach something above you, use a strong step stool that has a grab bar. Keep electrical cords out of the way. Do not use floor polish or wax that makes floors slippery. If you must use wax, use non-skid floor wax. Do not have throw rugs and other things on the floor that can make you trip. What can I do with my stairs? Do not leave any items on the stairs. Make sure that there are handrails on both sides of the stairs and use them. Fix handrails that are broken or loose. Make sure that handrails  are as long as the stairways. Check any carpeting to make sure that it is firmly attached to the stairs. Fix any carpet that is loose or worn. Avoid having throw rugs at the top or bottom of the stairs. If you do have throw rugs, attach them to the floor with carpet tape. Make sure that you have a light switch at the top of the stairs and the bottom of the stairs. If you do not have them, ask someone to add them for you. What else can I do to help prevent falls? Wear shoes that: Do not have high heels. Have rubber bottoms. Are comfortable and fit you well. Are closed at the toe. Do not wear sandals. If you use a stepladder: Make sure that it is fully opened. Do not climb a closed stepladder. Make sure that both sides of the stepladder are locked into place. Ask someone to hold it for you, if possible. Clearly mark and make sure that you can see: Any grab bars or handrails. First and last steps. Where the edge of each step is. Use tools that help you move around (mobility aids) if they are needed. These include: Canes. Walkers. Scooters. Crutches. Turn on the lights when you go into a dark area. Replace any light bulbs as soon as they burn out. Set up your furniture so you have a clear path. Avoid moving your furniture around. If any of your floors are uneven, fix them. If there are any pets around you, be aware of where they are. Review your medicines with your doctor. Some medicines can make you feel dizzy. This can increase your chance of falling. Ask your doctor what other things that you can do to help prevent falls. This information is not intended to replace advice given to you by your health care provider. Make sure you discuss any questions you have with your health care provider. Document Released: 12/16/2008 Document Revised: 07/28/2015 Document Reviewed: 03/26/2014 Elsevier Interactive Patient Education  2017 [employer].

## 2022-10-12 DIAGNOSIS — E559 Vitamin D deficiency, unspecified: Secondary | ICD-10-CM | POA: Diagnosis not present

## 2022-10-12 DIAGNOSIS — I129 Hypertensive chronic kidney disease with stage 1 through stage 4 chronic kidney disease, or unspecified chronic kidney disease: Secondary | ICD-10-CM | POA: Diagnosis not present

## 2022-10-12 DIAGNOSIS — D631 Anemia in chronic kidney disease: Secondary | ICD-10-CM | POA: Diagnosis not present

## 2022-10-12 DIAGNOSIS — N1832 Chronic kidney disease, stage 3b: Secondary | ICD-10-CM | POA: Diagnosis not present

## 2022-10-18 ENCOUNTER — Encounter: Payer: Self-pay | Admitting: Internal Medicine

## 2022-10-18 ENCOUNTER — Other Ambulatory Visit: Payer: Self-pay

## 2022-10-18 ENCOUNTER — Ambulatory Visit (INDEPENDENT_AMBULATORY_CARE_PROVIDER_SITE_OTHER): Payer: Medicare Other | Admitting: Internal Medicine

## 2022-10-18 VITALS — BP 114/61 | HR 67 | Temp 98.3°F | Ht 71.0 in | Wt 139.7 lb

## 2022-10-18 DIAGNOSIS — E782 Mixed hyperlipidemia: Secondary | ICD-10-CM

## 2022-10-18 DIAGNOSIS — I739 Peripheral vascular disease, unspecified: Secondary | ICD-10-CM

## 2022-10-18 DIAGNOSIS — I10 Essential (primary) hypertension: Secondary | ICD-10-CM

## 2022-10-18 DIAGNOSIS — E1122 Type 2 diabetes mellitus with diabetic chronic kidney disease: Secondary | ICD-10-CM

## 2022-10-18 DIAGNOSIS — Z87891 Personal history of nicotine dependence: Secondary | ICD-10-CM

## 2022-10-18 DIAGNOSIS — I129 Hypertensive chronic kidney disease with stage 1 through stage 4 chronic kidney disease, or unspecified chronic kidney disease: Secondary | ICD-10-CM

## 2022-10-18 DIAGNOSIS — J438 Other emphysema: Secondary | ICD-10-CM | POA: Diagnosis not present

## 2022-10-18 DIAGNOSIS — E785 Hyperlipidemia, unspecified: Secondary | ICD-10-CM | POA: Diagnosis not present

## 2022-10-18 DIAGNOSIS — N1832 Chronic kidney disease, stage 3b: Secondary | ICD-10-CM | POA: Diagnosis not present

## 2022-10-18 DIAGNOSIS — Z794 Long term (current) use of insulin: Secondary | ICD-10-CM

## 2022-10-18 LAB — POCT GLYCOSYLATED HEMOGLOBIN (HGB A1C): Hemoglobin A1C: 6.2 % — AB (ref 4.0–5.6)

## 2022-10-18 LAB — GLUCOSE, CAPILLARY: Glucose-Capillary: 130 mg/dL — ABNORMAL HIGH (ref 70–99)

## 2022-10-18 MED ORDER — SODIUM BICARBONATE 650 MG PO TABS: 1300.0000 mg | ORAL_TABLET | Freq: Two times a day (BID) | ORAL | 3 refills | Status: AC

## 2022-10-18 MED ORDER — LISINOPRIL 40 MG PO TABS
40.0000 mg | ORAL_TABLET | Freq: Every day | ORAL | 3 refills | Status: DC
Start: 2022-10-18 — End: 2022-11-27

## 2022-10-18 MED ORDER — LISINOPRIL 40 MG PO TABS
40.0000 mg | ORAL_TABLET | Freq: Every day | ORAL | 1 refills | Status: DC
Start: 2022-10-18 — End: 2022-10-18

## 2022-10-18 MED ORDER — AMLODIPINE BESYLATE 10 MG PO TABS
10.0000 mg | ORAL_TABLET | Freq: Every day | ORAL | 3 refills | Status: DC
Start: 2022-10-18 — End: 2022-11-27

## 2022-10-18 NOTE — Progress Notes (Signed)
Established Patient Office Visit  Subjective   Patient ID: William Perkins, male    DOB: 1950-07-15  Age: 72 y.o. MRN: [id number]  Chief Complaint  Patient presents with   Check-up Visit   William Perkins presents today to follow-up on hypertension, diabetes, PAD.  He recently saw Dr. Allena Katz his nephrologist.  Had labs done and was told his kidneys are doing fine.  Reviewed medications today reports adherence to all medications.  Saw Dr. Blase Mess, Vasc sx back in February, has reduced TBI's but no wounds or significant symptoms.  Monitoring his feet daily.  He does not take any medications for diabetes he has tried to follow a "healthy diet"    Objective:     BP 114/61 (BP Location: Left Arm, Patient Position: Sitting, Cuff Size: Normal)   Pulse 67   Temp 98.3 F (36.8 C) (Oral)   Ht 5\' 11"  (1.803 m)   Wt 139 lb 11.2 oz (63.4 kg)   SpO2 100% Comment: RA  BMI 19.48 kg/m  BP Readings from Last 3 Encounters:  10/18/22 114/61  09/19/22 137/62  04/17/22 125/71   Wt Readings from Last 3 Encounters:  10/18/22 139 lb 11.2 oz (63.4 kg)  09/19/22 142 lb 3.2 oz (64.5 kg)  04/17/22 140 lb (63.5 kg)      Physical Exam Constitutional:      Appearance: Normal appearance.  Cardiovascular:     Rate and Rhythm: Normal rate and regular rhythm.     Pulses:          Dorsalis pedis pulses are 1+ on the right side and 1+ on the left side.       Posterior tibial pulses are 1+ on the right side and 1+ on the left side.  Pulmonary:     Effort: Pulmonary effort is normal.     Breath sounds: Normal breath sounds. No wheezing.  Neurological:     Mental Status: He is alert.  Psychiatric:        Mood and Affect: Mood normal.        Behavior: Behavior normal.      Results for orders placed or performed in visit on 10/18/22  Glucose, capillary  Result Value Ref Range   Glucose-Capillary 130 (H) 70 - 99 mg/dL  POC Hbg W0J  Result Value Ref Range   Hemoglobin A1C 6.2 (A) 4.0 - 5.6 %   HbA1c POC (<>  result, manual entry)     HbA1c, POC (prediabetic range)     HbA1c, POC (controlled diabetic range)      Last CBC Lab Results  Component Value Date   WBC 11.7 (H) 05/21/2020   HGB 9.0 (L) 05/21/2020   HCT 27.1 (L) 05/21/2020   MCV 90.0 05/21/2020   MCH 29.9 05/21/2020   RDW 12.6 05/21/2020   PLT 249 05/21/2020   Last metabolic panel Lab Results  Component Value Date   GLUCOSE 111 (H) 03/22/2022   NA 138 03/22/2022   K 4.5 03/22/2022   CL 104 03/22/2022   CO2 16 (L) 03/22/2022   BUN 25 03/22/2022   CREATININE 1.90 (H) 03/22/2022   EGFR 27.0 08/20/2022   CALCIUM 9.8 03/22/2022   PROT 6.5 05/20/2020   ALBUMIN 3.6 05/20/2020   LABGLOB 2.8 01/20/2015   AGRATIO 1.5 01/20/2015   BILITOT 0.6 05/20/2020   ALKPHOS 44 05/20/2020   AST 18 05/20/2020   ALT 17 05/20/2020   ANIONGAP 9 05/21/2020   Last hemoglobin A1c Lab Results  Component Value  Date   HGBA1C 6.2 (A) 10/18/2022      The 10-year ASCVD risk score (Arnett DK, et al., 2019) is: 27.5%    Assessment & Plan:   Problem List Items Addressed This Visit       Cardiovascular and Mediastinum   PAD (peripheral artery disease) (HCC)    Continue 81 mg of aspirin daily and Crestor will recheck lipid panel and titrate if necessary.      Relevant Medications   amLODipine (NORVASC) 10 MG tablet   lisinopril (ZESTRIL) 40 MG tablet   Essential hypertension    Blood pressure is well-controlled I will refill his amlodipine and lisinopril.  Dr. Allena Katz recently refilled his clonidine and hydralazine.      Relevant Medications   amLODipine (NORVASC) 10 MG tablet   lisinopril (ZESTRIL) 40 MG tablet     Respiratory   Other emphysema (HCC)    Lungs are clear to auscultation today he denies any dyspnea or other symptoms.  Will continue to monitor.        Endocrine   Type 2 diabetes mellitus with stage 3 chronic kidney disease, without long-term current use of insulin (HCC) - Primary    A1c is 6.2% he denies any hyper or  hypoglycemia symptoms.  Continue to follow a diabetic diet and monitor progression of CKD.      Relevant Medications   lisinopril (ZESTRIL) 40 MG tablet   Other Relevant Orders   POC Hbg A1C (Completed)   Microalbumin / creatinine urine ratio   Lipid Profile     Other   Hyperlipidemia   Relevant Medications   amLODipine (NORVASC) 10 MG tablet   lisinopril (ZESTRIL) 40 MG tablet    Return in about 6 months (around 04/20/2023).    [name], DO

## 2022-10-18 NOTE — Assessment & Plan Note (Signed)
Blood pressure is well-controlled I will refill his amlodipine and lisinopril.  Dr. Allena Katz recently refilled his clonidine and hydralazine.

## 2022-10-18 NOTE — Assessment & Plan Note (Signed)
Lungs are clear to auscultation today he denies any dyspnea or other symptoms.  Will continue to monitor.

## 2022-10-18 NOTE — Assessment & Plan Note (Signed)
Continue 81 mg of aspirin daily and Crestor will recheck lipid panel and titrate if necessary.

## 2022-10-18 NOTE — Assessment & Plan Note (Signed)
A1c is 6.2% he denies any hyper or hypoglycemia symptoms.  Continue to follow a diabetic diet and monitor progression of CKD.

## 2022-10-19 LAB — LIPID PANEL
Chol/HDL Ratio: 3 ratio (ref 0.0–5.0)
Cholesterol, Total: 154 mg/dL (ref 100–199)
HDL: 51 mg/dL (ref >39)
LDL Chol Calc (NIH): 78 mg/dL (ref 0–99)
Triglycerides: 141 mg/dL (ref 0–149)
VLDL Cholesterol Cal: 25 mg/dL (ref 5–40)

## 2022-10-20 LAB — MICROALBUMIN / CREATININE URINE RATIO
Creatinine, Urine: 102.3 mg/dL
Microalb/Creat Ratio: 416 mg/g{creat} — ABNORMAL HIGH (ref 0–29)
Microalbumin, Urine: 425.2 ug/mL

## 2022-11-27 ENCOUNTER — Ambulatory Visit (INDEPENDENT_AMBULATORY_CARE_PROVIDER_SITE_OTHER): Payer: Medicare Other | Admitting: Student

## 2022-11-27 VITALS — BP 119/64 | HR 59 | Temp 97.8°F | Ht 71.0 in | Wt 140.2 lb

## 2022-11-27 DIAGNOSIS — E1122 Type 2 diabetes mellitus with diabetic chronic kidney disease: Secondary | ICD-10-CM

## 2022-11-27 DIAGNOSIS — I10 Essential (primary) hypertension: Secondary | ICD-10-CM

## 2022-11-27 DIAGNOSIS — N1832 Chronic kidney disease, stage 3b: Secondary | ICD-10-CM

## 2022-11-27 DIAGNOSIS — R490 Dysphonia: Secondary | ICD-10-CM | POA: Insufficient documentation

## 2022-11-27 DIAGNOSIS — I129 Hypertensive chronic kidney disease with stage 1 through stage 4 chronic kidney disease, or unspecified chronic kidney disease: Secondary | ICD-10-CM | POA: Diagnosis not present

## 2022-11-27 MED ORDER — AMLODIPINE BESYLATE 10 MG PO TABS
10.0000 mg | ORAL_TABLET | Freq: Every day | ORAL | 3 refills | Status: DC
Start: 2022-11-27 — End: 2023-12-16

## 2022-11-27 MED ORDER — LISINOPRIL 40 MG PO TABS
40.0000 mg | ORAL_TABLET | Freq: Every day | ORAL | 3 refills | Status: DC
Start: 2022-11-27 — End: 2023-12-16

## 2022-11-27 NOTE — Assessment & Plan Note (Signed)
Presents today with 4 weeks of persistent hoarseness of his voice with only associated symptom being some mild thick mucus production intermittently.  He denies preceding URI, voice stresses, instrumentation, new environment or inhaled/swallowed products, no signs or symptoms of GERD, no issues with chronic sinusitis or postnasal drip, no chronic alcohol use, and no significant B symptoms.  He does have a 45 pack-year history of smoking and quit 9 years ago.  On exam oropharyngeal area is unremarkable overall.  He does have some questionable lymphadenopathy under the posterior right mandible.  Differential of chronic/persistent hoarseness is wide and includes several benign processes such as a vocal cord polyp but also includes cancer.  Will refer to otolaryngology for further evaluation and will hold off on imaging until they are able to see them. - Referral to ENT

## 2022-11-27 NOTE — Assessment & Plan Note (Signed)
Blood pressure today 119/64.  Patient has not had any issues on current medications and had metabolic panels checked by his nephrologist in 08/2022.  No change in medications today but we will refill his lisinopril and amlodipine. - Continue amlodipine 10 mg daily, clonidine 0.1 mg twice daily, hydralazine 50 mg twice daily, and lisinopril 40 mg daily

## 2022-11-27 NOTE — Patient Instructions (Signed)
  Thank you, William Perkins, for allowing [place] to provide your care today. Today we discussed . . .  > Hoarseness       -We have sent a referral to the ENT doctor to get you evaluated further for this persistent hoarseness.  This is likely a benign issue but we did discuss we cannot rule out serious conditions such as cancer without seeing the ENT doctor.  Please let us know if there are any issues scheduling with their clinic or if they have not contacted you before next Monday.  We will plan to see you back here in about 2 to 3 months or sooner if you are having any other issues.   Referrals ordered today:    Referral Orders         Ambulatory referral to ENT       I have ordered the following medication/changed the following medications:   Stop the following medications: Medications Discontinued During This Encounter  Medication Reason   amLODipine (NORVASC) 10 MG tablet Reorder   lisinopril (ZESTRIL) 40 MG tablet Reorder     Start the following medications: Meds ordered this encounter  Medications   amLODipine (NORVASC) 10 MG tablet    Sig: Take 1 tablet (10 mg total) by mouth daily.    Dispense:  90 tablet    Refill:  3   lisinopril (ZESTRIL) 40 MG tablet    Sig: Take 1 tablet (40 mg total) by mouth daily.    Dispense:  90 tablet    Refill:  3      Follow up: 2-3 months    Remember:     Should you have any questions or concerns please call the internal medicine clinic at [phone].     Rocky Morel, DO [facility]

## 2022-11-27 NOTE — Progress Notes (Signed)
   CC: Hoarseness  HPI:  William Perkins is a 72 y.o. male with PMH as below who presents to the clinic with concerns of hoarseness.  Please see assessment and plan for further details.  Past Medical History:  Diagnosis Date   Chronic renal insufficiency 03/21/2006   CrCl <50 ml   DDD (degenerative disc disease), cervical    GERD 03/22/2006   Qualifier: Diagnosis of  By: Danae Chen     GERD (gastroesophageal reflux disease) 03/22/2006   Hand numbness 10/07/2009   Hyperlipidemia 03/22/2008   Hypertension    Hypertension 03/22/2006   Left ventricular hypertrophy 03/22/2006   Leg pain, left 11/15/2009   Lumbar strain 07/15/2008   Renal insufficiency    Soft tissue disorder 08/2008   Tobacco abuse 03/22/2006   2 ppd x 30 years   Review of Systems:   Pertinent items noted in HPI and/or A&P.  Physical Exam:  Vitals:   11/27/22 1045  BP: 119/64  Pulse: (!) 59  Temp: 97.8 F (36.6 C)  TempSrc: Oral  SpO2: 100%  Weight: 140 lb 3.2 oz (63.6 kg)  Height: 5\' 11"  (1.803 m)    Constitutional: Well-appearing elderly male. In no acute distress. HEENT: Normocephalic, atraumatic, Sclera non-icteric, PERRL, EOM intact.  Edentulous but otherwise no significant abnormality on oropharyngeal exam.  Semifirm but nontender nodule measuring about 1 cm medial to the posterior right mandible.  No other noted head or neck lymphadenopathy Cardio:Regular rate and rhythm. 2+ bilateral radial pulses. Pulm:Clear to auscultation bilaterally. Normal work of breathing on room air. Abdomen: Soft, non-tender, non-distended, positive bowel sounds. JXB:JYNWGNFA for extremity edema. Skin:Warm and dry. Neuro:Alert and oriented x3. No focal deficit noted. Psych:Pleasant mood and affect.   Assessment & Plan:   Type 2 diabetes mellitus with stage 3 chronic kidney disease, without long-term current use of insulin (HCC) Last visit 10/18/2022 showed elevated ACR at about 400.  He is not on an SGLT2  but is on an ACE inhibitor.  Due to his acute concern today we did not discuss SGLT2 inhibitors but I recommend that these be considered when he follows up again in 2 months.  Hoarseness, persistent Presents today with 4 weeks of persistent hoarseness of his voice with only associated symptom being some mild thick mucus production intermittently.  He denies preceding URI, voice stresses, instrumentation, new environment or inhaled/swallowed products, no signs or symptoms of GERD, no issues with chronic sinusitis or postnasal drip, no chronic alcohol use, and no significant B symptoms.  He does have a 45 pack-year history of smoking and quit 9 years ago.  On exam oropharyngeal area is unremarkable overall.  He does have some questionable lymphadenopathy under the posterior right mandible.  Differential of chronic/persistent hoarseness is wide and includes several benign processes such as a vocal cord polyp but also includes cancer.  Will refer to otolaryngology for further evaluation and will hold off on imaging until they are able to see them. - Referral to ENT  Essential hypertension Blood pressure today 119/64.  Patient has not had any issues on current medications and had metabolic panels checked by his nephrologist in 08/2022.  No change in medications today but we will refill his lisinopril and amlodipine. - Continue amlodipine 10 mg daily, clonidine 0.1 mg twice daily, hydralazine 50 mg twice daily, and lisinopril 40 mg daily    Patient discussed with Dr. Duwayne Heck, DO [facility] Internal Medicine Resident PGY-2 Pager: [phone]

## 2022-11-27 NOTE — Addendum Note (Signed)
Addended by: Rocky Morel on: 11/27/2022 02:41 PM   Modules accepted: Level of Service

## 2022-11-27 NOTE — Assessment & Plan Note (Signed)
Last visit 10/18/2022 showed elevated ACR at about 400.  He is not on an SGLT2 but is on an ACE inhibitor.  Due to his acute concern today we did not discuss SGLT2 inhibitors but I recommend that these be considered when he follows up again in 2 months.

## 2022-12-03 NOTE — Progress Notes (Signed)
Internal Medicine Clinic Attending  Case discussed with the resident at the time of the visit.  We reviewed the resident's history and exam and pertinent patient test results.  I agree with the assessment, diagnosis, and plan of care documented in the resident's note.  

## 2023-01-17 ENCOUNTER — Ambulatory Visit (INDEPENDENT_AMBULATORY_CARE_PROVIDER_SITE_OTHER): Payer: Medicare Other | Admitting: Otolaryngology

## 2023-01-17 ENCOUNTER — Encounter (INDEPENDENT_AMBULATORY_CARE_PROVIDER_SITE_OTHER): Payer: Self-pay

## 2023-01-17 ENCOUNTER — Telehealth (INDEPENDENT_AMBULATORY_CARE_PROVIDER_SITE_OTHER): Payer: Self-pay

## 2023-01-17 VITALS — Ht 71.0 in | Wt 140.0 lb

## 2023-01-17 DIAGNOSIS — J383 Other diseases of vocal cords: Secondary | ICD-10-CM | POA: Diagnosis not present

## 2023-01-17 DIAGNOSIS — R490 Dysphonia: Secondary | ICD-10-CM | POA: Diagnosis not present

## 2023-01-17 NOTE — Patient Instructions (Signed)
I have ordered an imaging study for you to complete prior to your next visit. Please call [facility] Scheduling at [phone] to schedule your imaging if you have not received a call within 24 hours. If you are unable to complete your imaging study prior to your next scheduled visit please call our office to let us know.

## 2023-01-17 NOTE — Telephone Encounter (Signed)
Spoke with internal medicine to get fax number to fax clearance form to [phone]. I faxed the form over at 2:11pm on 01-17-2023 for surgery on 01-24-23 for direct laryngoscopy with biopsy. Need back by 01-21-2023

## 2023-01-17 NOTE — Progress Notes (Signed)
Dear Dr. Heide Spark, Here is my assessment for our mutual patient, William Perkins. Thank you for allowing me the opportunity to care for your patient. Please do not hesitate to contact me should you have any other questions. Sincerely, Dr. Jovita Kussmaul  Otolaryngology Clinic Note Referring provider: Dr. Heide Spark HPI:  William Perkins is a 72 y.o. male kindly referred by Dr. Heide Spark for evaluation of dysphonia. Reports that has had persistent dysphonia, started in about August. Did not completely lose the voice. Has not changed. No preceding URI. Reports nothing makes it better or worse except using it.  No shortness of breath, no trouble swallowing, no throat or neck pain, no ear pain, no PND, no sinusitis symptoms. No cough, no hemoptysis. No SOB No GERD Sx  Does have 45 pack year smoking Hx, quit 9 years ago.  Good functional status  PMHx: GERD, CKD, DDD, HLD, HTN, T2DM  H&N Surgery: no Personal or FHx of bleeding dz or anesthesia difficulty: no   AP/AC: ASA 81  Tobacco: prior smoker, quit 9 years ago (45 pack year history). Alcohol: no. Occupation: Estate manager/land agent (retired). Lives in [place]  Independent Review of Additional Tests or Records:  PCP notes reviewed Dr. Allena Katz (nephro notes) unable to review  PMH/Meds/All/SocHx/FamHx/ROS:   Past Medical History:  Diagnosis Date   Chronic renal insufficiency 03/21/2006   CrCl <50 ml   DDD (degenerative disc disease), cervical    GERD 03/22/2006   Qualifier: Diagnosis of  By: Danae Chen     GERD (gastroesophageal reflux disease) 03/22/2006   Hand numbness 10/07/2009   Hyperlipidemia 03/22/2008   Hypertension    Hypertension 03/22/2006   Left ventricular hypertrophy 03/22/2006   Leg pain, left 11/15/2009   Lumbar strain 07/15/2008   Renal insufficiency    Soft tissue disorder 08/2008   Tobacco abuse 03/22/2006   2 ppd x 30 years     Past Surgical History:  Procedure Laterality Date   ABDOMINAL AORTOGRAM  W/LOWER EXTREMITY N/A 05/18/2020   Procedure: ABDOMINAL AORTOGRAM W/LOWER EXTREMITY;  Surgeon: Leonie Douglas, MD;  Location: [facility]IVE [facility];  Service: Cardiovascular;  Laterality: N/A;   COLONOSCOPY     ENDARTERECTOMY FEMORAL Right 05/20/2020   Procedure: RIGHT EXTENDED FEMORAL ENDARTERECTOMY AND PROFUNDAPLASTY;  Surgeon: Leonie Douglas, MD;  Location: [facility] OR;  Service: Vascular;  Laterality: Right;   WISDOM TOOTH EXTRACTION      Family History  Problem Relation Age of Onset   Diabetes Mother    Diabetes Sister    Diabetes Brother    Heart disease Brother    Rectal cancer Neg Hx    Stomach cancer Neg Hx    Colon cancer Neg Hx      Social Connections: Moderately Isolated (09/19/2022)   Social Connection and Isolation Panel [NHANES]    Frequency of Communication with Friends and Family: Once a week    Frequency of Social Gatherings with Friends and Family: Three times a week    Attends Religious Services: Never    Active Member of Clubs or Organizations: No    Attends Banker Meetings: Never    Marital Status: Married      Current Outpatient Medications:    acetaminophen (TYLENOL) 500 MG tablet, Take 1,000 mg by mouth every 6 (six) hours as needed for mild pain or moderate pain., Disp: , Rfl:    amLODipine (NORVASC) 10 MG tablet, Take 1 tablet (10 mg total) by mouth daily., Disp: 90 tablet, Rfl: 3  aspirin EC 81 MG tablet, Take 81 mg by mouth daily. Swallow whole., Disp: , Rfl:    cloNIDine (CATAPRES) 0.1 MG tablet, Take 1 tablet (0.1 mg total) by mouth 2 (two) times daily., Disp: 180 tablet, Rfl: 3   hydrALAZINE (APRESOLINE) 50 MG tablet, Take 50 mg by mouth 2 (two) times daily., Disp: , Rfl:    lisinopril (ZESTRIL) 40 MG tablet, Take 1 tablet (40 mg total) by mouth daily., Disp: 90 tablet, Rfl: 3   polyethylene glycol (MIRALAX / GLYCOLAX) 17 g packet, Take 17 g by mouth 2 (two) times daily. (Patient taking differently: Take 17 g by mouth daily as needed for  mild constipation or moderate constipation.), Disp: 14 each, Rfl: 25   rosuvastatin (CRESTOR) 20 MG tablet, Take 1 tablet (20 mg total) by mouth daily., Disp: 90 tablet, Rfl: 3   sodium bicarbonate 650 MG tablet, Take 2 tablets (1,300 mg total) by mouth 2 (two) times daily., Disp: 360 tablet, Rfl: 3   Physical Exam:   Ht 5\' 11"  (1.803 m)   Wt 140 lb (63.5 kg)   BMI 19.53 kg/m   Salient findings:  CN II-XII intact  Bilateral EAC clear and TM intact with well pneumatized middle ear spaces Anterior rhinoscopy: Septum midline; bilateral inferior turbinates without hypertrophy No lesions of oral cavity/oropharynx; edentulous No obviously palpable neck masses/lymphadenopathy/thyromegaly No respiratory distress or stridor; voice quality class IV - lays flat easily, tolerates secretions; given history of smoking and complaints, TFL was indicated and performed below  Seprately Identifiable Procedures:  Procedure Note Pre-procedure diagnosis:  Dysphonia Post-procedure diagnosis: Same Procedure: Transnasal Fiberoptic Laryngoscopy, CPT 96045 - Mod 25 Indication: dysphonia Complications: None apparent EBL: 0 mL Date: 01/17/23   The procedure was undertaken to further evaluate the patient's complaint of dysphonia, with mirror exam inadequate for appropriate examination due to gag reflex and poor patient tolerance  Procedure:  Patient was identified as correct patient. Verbal consent was obtained. The nose was sprayed with oxymetazoline and 4% lidocaine. The The flexible laryngoscope was passed through the nose to view the nasal cavity, pharynx (oropharynx, hypopharynx) and larynx.  The larynx was examined at rest and during multiple phonatory tasks. Documentation was obtained and reviewed with patient. The scope was removed. The patient tolerated the procedure well.  Findings: The nasal cavity and nasopharynx did not reveal any masses or lesions, mucosa appeared to be without obvious lesions. The  tongue base, pharyngeal walls, piriform sinuses, vallecula, epiglottis and postcricoid region are normal in appearance without masses; The visualized portion of the subglottis and proximal trachea is widely patent. The vocal folds are mobile bilaterally - perhaps slightly hypomobile on right. Right vocal fold exophytic mass, appears to involve commissure and posteriorly extends; Query extension onto petiole and laterally where mucosa looks a bit irregular. Concern for malignancy    Electronically signed by: Read Drivers, MD 01/17/2023 11:16 AM   Impression & Plans:  William Perkins is a 72 y.o. male with:  1. Lesion of vocal fold   2. 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 Certainly appears to extend to anterior commissure and contralateral vocal fold. Right VF is slightly hypomobile but not fixed. There are some mucosal changes involving right petiole as well, and I wonder if this is early dysplasia or extension of tumor. Unable to evaluate subglottis well. Patent airway currently.  We discussed need for biopsy. Will schedule for DL/Biopsy He's on ASA 81 but can continue this Will contact Dr.  Hellon Vaccarella (Nephro) for contrast clearance for CT Neck with contrast - important to delineate extent and nodal disease   Thank you for allowing me the opportunity to care for your patient. Please do not hesitate to contact me should you have any other questions.  Sincerely, Jovita Kussmaul, MD Otolarynoglogist (ENT), [facility] Phone: [phone] Fax: [phone]  01/17/2023, 11:16 AM   MDM:  Level 5 Complexity/Problems addressed: life threatening problem Data complexity: mod - Morbidity: high - cancer diagnosis; need for surgery with risk factors

## 2023-01-21 ENCOUNTER — Telehealth (INDEPENDENT_AMBULATORY_CARE_PROVIDER_SITE_OTHER): Payer: Self-pay

## 2023-01-21 NOTE — Telephone Encounter (Signed)
Correct documentation He needs to hold lisinopril before and after CT scan. 24 hours the lisinopril needs to be held. Then he needs at least of normal saline before and after CT scan with contrast. Repeat BMP one week after procedure.

## 2023-01-21 NOTE — Telephone Encounter (Signed)
DR. Allena Katz MA called and said for [place] to tell patient to stop the lisinopril before and after procedure for 24 hours and at least normal saline before and after procedure. Then repeat BMP one week after surgery. Call and tell patient.

## 2023-01-21 NOTE — Telephone Encounter (Signed)
Called patient and told him that he needs to hold his lisinopril before and after his CT scan. 24 hours He will also need 500 ml of normal saline before and after CT scan. Then repeat BMP one week after procedure.. Per kidney doctor Allena Katz.

## 2023-01-22 ENCOUNTER — Encounter (HOSPITAL_COMMUNITY): Payer: Self-pay | Admitting: General Practice

## 2023-01-22 ENCOUNTER — Other Ambulatory Visit: Payer: Self-pay

## 2023-01-22 ENCOUNTER — Telehealth (INDEPENDENT_AMBULATORY_CARE_PROVIDER_SITE_OTHER): Payer: Self-pay | Admitting: Otolaryngology

## 2023-01-22 ENCOUNTER — Ambulatory Visit (HOSPITAL_COMMUNITY): Payer: Medicare Other | Attending: Otolaryngology

## 2023-01-22 ENCOUNTER — Telehealth (INDEPENDENT_AMBULATORY_CARE_PROVIDER_SITE_OTHER): Payer: Self-pay

## 2023-01-22 NOTE — Telephone Encounter (Signed)
ENT Note: Spoke with patient's Nephrologist (Dr. Zetta Bills).  Given concern for carcinoma, he needs to have a CT Neck with contrast.  Nephrologist provided clearance for CT Neck with contrast provided the following: patient needs to hold lisinopril before and after CT scan. 24 hours the lisinopril needs to be held.  Then he needs at least of normal saline before and after CT scan with contrast.  Repeat BMP one week after procedure.   Please notify me with any questions  Read Drivers

## 2023-01-22 NOTE — Progress Notes (Signed)
SDW CALL  Patient was given pre-op instructions over the phone. The opportunity was given for the patient to ask questions. No further questions asked. Patient verbalized understanding of instructions given.   PCP - Dr. Rocky Morel at Internal Medicine Cardiologist - denies  PPM/ICD - denies Device Orders - n/a Rep Notified - n/a  Chest x-ray - denies EKG - DOS Stress Test - denies ECHO - denies Cardiac Cath -  denies  Sleep Study - denies CPAP - n/a  Fasting Blood Sugar - denies  Last dose of GLP1 agonist-  n/a GLP1 instructions: n/a  Blood Thinner Instructions: n/a Aspirin Instructions:   ERAS Protcol - NPO  COVID TEST- n/a   Anesthesia review: yes  Patient denies shortness of breath, fever, cough and chest pain over the phone call   All instructions explained to the patient, with a verbal understanding of the material. Patient agrees to go over the instructions while at home for a better understanding.

## 2023-01-22 NOTE — Telephone Encounter (Signed)
Spoke with wife about CT scan they cannot do it today. They have to hold the lisnopril for 24 hours she said she will schedule the CT scan for Thursday before he comes in for his biopsy. Explain to her that he is also going to get of normal saline before and after CT scan. These were the dorections per  DR Vonna Kotyk patel. This will be a CT scan with contrast.

## 2023-01-24 ENCOUNTER — Encounter (HOSPITAL_COMMUNITY): Payer: Self-pay | Admitting: *Deleted

## 2023-01-24 ENCOUNTER — Encounter (HOSPITAL_COMMUNITY)
Admission: RE | Disposition: A | Discharge status: Home / Self Care | Payer: Self-pay | Source: Home / Self Care | Attending: Otolaryngology

## 2023-01-24 ENCOUNTER — Other Ambulatory Visit: Payer: Self-pay

## 2023-01-24 ENCOUNTER — Ambulatory Visit (HOSPITAL_COMMUNITY)
Admission: RE | Admit: 2023-01-24 | Discharge: 2023-01-24 | Disposition: A | Discharge status: Home / Self Care | Payer: Medicare Other | Attending: Otolaryngology | Admitting: Otolaryngology

## 2023-01-24 ENCOUNTER — Ambulatory Visit (HOSPITAL_COMMUNITY): Payer: Medicare Other | Admitting: Physician Assistant

## 2023-01-24 ENCOUNTER — Ambulatory Visit (HOSPITAL_BASED_OUTPATIENT_CLINIC_OR_DEPARTMENT_OTHER): Payer: Medicare Other | Admitting: Physician Assistant

## 2023-01-24 DIAGNOSIS — N189 Chronic kidney disease, unspecified: Secondary | ICD-10-CM

## 2023-01-24 DIAGNOSIS — C320 Malignant neoplasm of glottis: Secondary | ICD-10-CM | POA: Insufficient documentation

## 2023-01-24 DIAGNOSIS — C321 Malignant neoplasm of supraglottis: Secondary | ICD-10-CM | POA: Diagnosis not present

## 2023-01-24 DIAGNOSIS — Z87891 Personal history of nicotine dependence: Secondary | ICD-10-CM | POA: Diagnosis not present

## 2023-01-24 DIAGNOSIS — E1151 Type 2 diabetes mellitus with diabetic peripheral angiopathy without gangrene: Secondary | ICD-10-CM | POA: Diagnosis not present

## 2023-01-24 DIAGNOSIS — E1122 Type 2 diabetes mellitus with diabetic chronic kidney disease: Secondary | ICD-10-CM | POA: Insufficient documentation

## 2023-01-24 DIAGNOSIS — I129 Hypertensive chronic kidney disease with stage 1 through stage 4 chronic kidney disease, or unspecified chronic kidney disease: Secondary | ICD-10-CM | POA: Insufficient documentation

## 2023-01-24 DIAGNOSIS — J383 Other diseases of vocal cords: Secondary | ICD-10-CM

## 2023-01-24 DIAGNOSIS — N183 Chronic kidney disease, stage 3 unspecified: Secondary | ICD-10-CM | POA: Diagnosis not present

## 2023-01-24 HISTORY — PX: DIRECT LARYNGOSCOPY: SHX5326

## 2023-01-24 LAB — CBC
HCT: 34.9 % — ABNORMAL LOW (ref 39.0–52.0)
Hemoglobin: 11.2 g/dL — ABNORMAL LOW (ref 13.0–17.0)
MCH: 29 pg (ref 26.0–34.0)
MCHC: 32.1 g/dL (ref 30.0–36.0)
MCV: 90.4 fL (ref 80.0–100.0)
Platelets: 244 10*3/uL (ref 150–400)
RBC: 3.86 MIL/uL — ABNORMAL LOW (ref 4.22–5.81)
RDW: 13.4 % (ref 11.5–15.5)
WBC: 6.2 10*3/uL (ref 4.0–10.5)
nRBC: 0 % (ref 0.0–0.2)

## 2023-01-24 LAB — BASIC METABOLIC PANEL
Anion gap: 13 (ref 5–15)
BUN: 27 mg/dL — ABNORMAL HIGH (ref 8–23)
CO2: 20 mmol/L — ABNORMAL LOW (ref 22–32)
Calcium: 9.2 mg/dL (ref 8.9–10.3)
Chloride: 103 mmol/L (ref 98–111)
Creatinine, Ser: 2.44 mg/dL — ABNORMAL HIGH (ref 0.61–1.24)
GFR, Estimated: 27 mL/min — ABNORMAL LOW (ref >60)
Glucose, Bld: 113 mg/dL — ABNORMAL HIGH (ref 70–99)
Potassium: 3.5 mmol/L (ref 3.5–5.1)
Sodium: 136 mmol/L (ref 135–145)

## 2023-01-24 LAB — SARS CORONAVIRUS 2 BY RT PCR: SARS Coronavirus 2 by RT PCR: NEGATIVE

## 2023-01-24 SURGERY — LARYNGOSCOPY, DIRECT
Anesthesia: General | Site: Mouth

## 2023-01-24 MED ORDER — ONDANSETRON HCL 4 MG/2ML IJ SOLN
INTRAMUSCULAR | Status: DC | PRN
  Administered 2023-01-24: 4 mg via INTRAVENOUS

## 2023-01-24 MED ORDER — ONDANSETRON HCL 4 MG/2ML IJ SOLN
INTRAMUSCULAR | Status: AC
  Filled 2023-01-24: qty 2

## 2023-01-24 MED ORDER — OXYCODONE HCL 5 MG/5ML PO SOLN
ORAL | Status: AC
  Filled 2023-01-24: qty 5

## 2023-01-24 MED ORDER — DEXAMETHASONE SODIUM PHOSPHATE 10 MG/ML IJ SOLN
INTRAMUSCULAR | Status: DC | PRN
  Administered 2023-01-24: 20 mg via INTRAVENOUS

## 2023-01-24 MED ORDER — 0.9 % SODIUM CHLORIDE (POUR BTL) OPTIME
TOPICAL | Status: DC | PRN
  Administered 2023-01-24: 1000 mL

## 2023-01-24 MED ORDER — SUGAMMADEX SODIUM 200 MG/2ML IV SOLN
INTRAVENOUS | Status: DC | PRN
  Administered 2023-01-24: 150 mg via INTRAVENOUS

## 2023-01-24 MED ORDER — LACTATED RINGERS IV SOLN: INTRAVENOUS | Status: DC | PRN

## 2023-01-24 MED ORDER — DEXAMETHASONE SODIUM PHOSPHATE 10 MG/ML IJ SOLN
INTRAMUSCULAR | Status: AC
  Filled 2023-01-24: qty 2

## 2023-01-24 MED ORDER — ROCURONIUM BROMIDE 10 MG/ML (PF) SYRINGE
PREFILLED_SYRINGE | INTRAVENOUS | Status: DC | PRN
  Administered 2023-01-24: 40 mg via INTRAVENOUS

## 2023-01-24 MED ORDER — ROCURONIUM BROMIDE 10 MG/ML (PF) SYRINGE
PREFILLED_SYRINGE | INTRAVENOUS | Status: AC
  Filled 2023-01-24: qty 10

## 2023-01-24 MED ORDER — LIDOCAINE 2% (20 MG/ML) 5 ML SYRINGE
INTRAMUSCULAR | Status: DC | PRN
  Administered 2023-01-24 (×2): 50 mg via INTRAVENOUS

## 2023-01-24 MED ORDER — ORAL CARE MOUTH RINSE: 15.0000 mL | Freq: Once | OROMUCOSAL | Status: AC

## 2023-01-24 MED ORDER — LABETALOL HCL 5 MG/ML IV SOLN
INTRAVENOUS | Status: AC
  Filled 2023-01-24: qty 4

## 2023-01-24 MED ORDER — OXYCODONE HCL 5 MG/5ML PO SOLN
5.0000 mg | Freq: Once | ORAL | Status: AC | PRN
  Administered 2023-01-24: 5 mg via ORAL

## 2023-01-24 MED ORDER — LACTATED RINGERS IV SOLN
INTRAVENOUS | Status: DC
Start: 2023-01-24 — End: 2023-01-25

## 2023-01-24 MED ORDER — LABETALOL HCL 5 MG/ML IV SOLN
10.0000 mg | Freq: Once | INTRAVENOUS | Status: AC
  Administered 2023-01-24: 10 mg via INTRAVENOUS

## 2023-01-24 MED ORDER — FENTANYL CITRATE (PF) 250 MCG/5ML IJ SOLN
INTRAMUSCULAR | Status: AC
  Filled 2023-01-24: qty 5

## 2023-01-24 MED ORDER — FENTANYL CITRATE (PF) 250 MCG/5ML IJ SOLN
INTRAMUSCULAR | Status: DC | PRN
  Administered 2023-01-24 (×2): 50 ug via INTRAVENOUS

## 2023-01-24 MED ORDER — CHLORHEXIDINE GLUCONATE 0.12 % MT SOLN
15.0000 mL | Freq: Once | OROMUCOSAL | Status: AC
Start: 2023-01-24 — End: 2023-01-24
  Administered 2023-01-24: 15 mL via OROMUCOSAL
  Filled 2023-01-24: qty 15

## 2023-01-24 MED ORDER — OXYMETAZOLINE HCL 0.05 % NA SOLN
NASAL | Status: AC
  Filled 2023-01-24: qty 30

## 2023-01-24 MED ORDER — OXYCODONE HCL 5 MG PO TABS: 5.0000 mg | ORAL_TABLET | Freq: Four times a day (QID) | ORAL | 0 refills | Status: DC | PRN

## 2023-01-24 MED ORDER — ONDANSETRON HCL 4 MG/2ML IJ SOLN: 4.0000 mg | Freq: Once | INTRAMUSCULAR | Status: DC | PRN

## 2023-01-24 MED ORDER — OXYCODONE HCL 5 MG PO TABS: 5.0000 mg | ORAL_TABLET | Freq: Once | ORAL | Status: AC | PRN

## 2023-01-24 MED ORDER — ACETAMINOPHEN 500 MG PO TABS: 1000.0000 mg | ORAL_TABLET | Freq: Once | ORAL | Status: DC

## 2023-01-24 MED ORDER — FENTANYL CITRATE (PF) 100 MCG/2ML IJ SOLN: 25.0000 ug | INTRAMUSCULAR | Status: DC | PRN

## 2023-01-24 MED ORDER — OXYMETAZOLINE HCL 0.05 % NA SOLN
NASAL | Status: DC | PRN
  Administered 2023-01-24: 1 via TOPICAL

## 2023-01-24 MED ORDER — PROPOFOL 10 MG/ML IV BOLUS
INTRAVENOUS | Status: DC | PRN
  Administered 2023-01-24: 130 mg via INTRAVENOUS

## 2023-01-24 SURGICAL SUPPLY — 17 items
BAG COUNTER SPONGE SURGICOUNT (BAG) ×2 IMPLANT
CANISTER SUCT 3000ML PPV (MISCELLANEOUS) ×2 IMPLANT
COVER BACK TABLE 60X90IN (DRAPES) ×2 IMPLANT
COVER MAYO STAND STRL (DRAPES) ×2 IMPLANT
DRAPE HALF SHEET 40X57 (DRAPES) ×2 IMPLANT
GAUZE 4X4 16PLY ~~LOC~~+RFID DBL (SPONGE) ×2 IMPLANT
GLOVE BIO SURGEON STRL SZ7.5 (GLOVE) ×2 IMPLANT
GOWN STRL REUS W/ TWL LRG LVL3 (GOWN DISPOSABLE) IMPLANT
GUARD TEETH (MISCELLANEOUS) ×2 IMPLANT
KIT TURNOVER KIT B (KITS) ×2 IMPLANT
NS IRRIG 1000ML POUR BTL (IV SOLUTION) ×2 IMPLANT
PAD ARMBOARD 7.5X6 YLW CONV (MISCELLANEOUS) ×4 IMPLANT
PATTIES SURGICAL .5 X3 (DISPOSABLE) IMPLANT
POSITIONER HEAD DONUT 9IN (MISCELLANEOUS) IMPLANT
SOL ANTI FOG 6CC (MISCELLANEOUS) IMPLANT
TOWEL GREEN STERILE FF (TOWEL DISPOSABLE) ×4 IMPLANT
TUBE CONNECTING 12X1/4 (SUCTIONS) ×2 IMPLANT

## 2023-01-24 NOTE — H&P (Addendum)
Pre-Operative H&P - Day Of Surgery Patient Name: William Perkins Date:   01/24/2023  HPI: William Perkins is a 72 y.o. male who presents today for operative treatment of vocal fold mass. Patient denies recent significant changes to health or significant new medications or physiologic change in condition which would immediately impact plans. No new types of therapy has been initiated that would change the plan or the appropriateness of the plan.   ROS:  A complete review of systems was obtained and is otherwise negative  PMH:  Past Medical History:  Diagnosis Date   Chronic renal insufficiency 03/21/2006   CrCl <50 ml   DDD (degenerative disc disease), cervical    GERD 03/22/2006   Qualifier: Diagnosis of  By: Danae Chen     GERD (gastroesophageal reflux disease) 03/22/2006   Hand numbness 10/07/2009   pt states not any longer   Hyperlipidemia 03/22/2008   Hypertension    Hypertension 03/22/2006   Left ventricular hypertrophy 03/22/2006   Leg pain, left 11/15/2009   Lumbar strain 07/15/2008   Renal insufficiency    Soft tissue disorder 08/2008   Tobacco abuse 03/22/2006   2 ppd x 30 years    PSH:  Past Surgical History:  Procedure Laterality Date   ABDOMINAL AORTOGRAM W/LOWER EXTREMITY N/A 05/18/2020   Procedure: ABDOMINAL AORTOGRAM W/LOWER EXTREMITY;  Surgeon: Leonie Douglas, MD;  Location: [facility];  Service: Cardiovascular;  Laterality: N/A;   BACK SURGERY  2020   PLIF   COLONOSCOPY     ENDARTERECTOMY FEMORAL Right 05/20/2020   Procedure: RIGHT EXTENDED FEMORAL ENDARTERECTOMY AND PROFUNDAPLASTY;  Surgeon: Leonie Douglas, MD;  Location: [facility] OR;  Service: Vascular;  Laterality: Right;   WISDOM TOOTH EXTRACTION      MEDS:   Current Facility-Administered Medications:    acetaminophen (TYLENOL) tablet 1,000 mg, 1,000 mg, Oral, Once, Beryle Lathe, MD   lactated ringers infusion, , Intravenous, Continuous, Hatchett, Susann Givens, MD, Last Rate: 10 mL/hr at  01/24/23 1334, New Bag at 01/24/23 1334  ALLERGIES: Patient has no known allergies.  EXAM: Vitals: BP 100/77   Pulse 66   Temp 98.4 F (36.9 C) (Oral)   Resp 18   Ht 5\' 11"  (1.803 m)   Wt 63.5 kg   SpO2 99%   BMI 19.53 kg/m   General Awake, at baseline alertness.   HEENT No scleral icterus or conjunctival hemorrhage. Globe position appears normal. External ears  normal. Nose patent without rhinorrhea. No lymphadenopathy. No thyromegaly  Cardiovascular No cyanosis.  Pulmonary No audible stridor. Breathing easily with no labor.  Neuro Symmetric facial movement.   Psychiatry Appropriate affect and mood.  Skin No scars or lesions on face or neck.  Extermities Moves all extremities with normal range of motion.   Other Findings None. Expected dysphonia, lays flat, easily tolerates secretions.   Assessment & Plan: William Perkins has diagnoses of vocal fold mass and will go to the OR today for Direct Laryngoscopy with biopsy, other indicated procedures. Informed consent was obtained and available today. All questions have been answered, and risks/benefits/alternatives of procedure as noted in the consent were discussed in a quiet area. Questions were invited and answered. The patient expressed understanding, provided consent and wished to proceed despite risks.  Read Drivers 01/24/2023 5:26 PM

## 2023-01-24 NOTE — Transfer of Care (Signed)
Immediate Anesthesia Transfer of Care Note  Patient: William Perkins  Procedure(s) Performed: DIRECT LARYNGOSCOPY (Mouth)  Patient Location: PACU  Anesthesia Type:General  Level of Consciousness: oriented and drowsy  Airway & Oxygen Therapy: Patient Spontanous Breathing and Patient connected to nasal cannula oxygen  Post-op Assessment: Report given to RN, Post -op Vital signs reviewed and stable, and Patient moving all extremities  Post vital signs: Reviewed and stable  Last Vitals:  Vitals Value Taken Time  BP 162/73 01/24/23 1819  Temp    Pulse 83 01/24/23 1822  Resp 15 01/24/23 1822  SpO2 99 % 01/24/23 1822  Vitals shown include unfiled device data.  Last Pain:  Vitals:   01/24/23 1320  TempSrc:   PainSc: 0-No pain         Complications: No notable events documented.

## 2023-01-24 NOTE — Op Note (Addendum)
Otolaryngology Operative note  William Perkins Date/Time of Admission: 01/24/2023 12:37 PM  CSN: [id number];ION:[id number]  DOB: 05-May-1950 Age: 72 y.o. Location: [facility] OR    Pre-Op Diagnosis: Right vocal fold and supraglottic lesion  Post-Op Diagnosis: Same  Procedure: Procedure(s): Suspension Microdirect Laryngoscopy with Biopsy using Operating Telescope - CPT 231 012 1498  Surgeon: Jovita Kussmaul, MD  Anesthesia type:  General  Anesthesiologist: Anesthesiologist: Achille Rich, MD; Collene Schlichter, MD CRNA: Ivin Poot, CRNA   Staff: Circulator: Virgel Bouquet, RN Scrub Person: Pietro Cassis, RN  Specimens: ID Type Source Tests Collected by Time Destination  1 : Right vocal fold Tissue PATH ENT biopsy SURGICAL PATHOLOGY Read Drivers, MD 01/24/2023 1751   2 : Right supraglottis Tissue PATH ENT biopsy SURGICAL PATHOLOGY Read Drivers, MD 01/24/2023 1754     EBL:  10 mL  Drains: None  Post-op disposition and condition: PACU, hemodynamically stable   Findings: Right vocal fold exophytic lesion spanning almost entire vocal fold and involving anterior commissure. The right false vocal fold and petiole area was also suspicious and also sent for pathology as separate specimen. Even if it returns as negative, the area is concerning for carcinoma Vallecula, pyriform sinus and subglottis without any lesions. Easy view with Dedo Laryngoscope without additional maneuvers.  Complications: None apparent  Indications and consent:  William Perkins is a 72 y.o. male with diagnoses above with long history of smoking. The patient's options were discussed, including risks/benefits/alternatives for each option. Patient expressed understanding, and despite these risks, consented and decided to proceed with above procedures. Informed consent was signed before proceeding.  Procedure: The patient was identified in the preoperative area, consent confirmed, transported to the operating suite.  They were transferred to the operating room table and placed in a supine position. After induction of general endotracheal anesthesia and establishment of airway, a surgeon initiated time out was performed.  The bed was rotated 90 degrees. The patient was then padded and draped appropriately.     The oral cavity was first examined and no abnormalities were identified. We then proceeded with our direct laryngoscopy. A moist gauze was placed to protect the maxillary gingiva. The Dedo laryngoscope was inserted into the oral cavity and advanced distally. Examination of the oral cavity, oropharynx, hypopharynx and larynx was performed in a sequential manner at that time with findings above. The operating telescope (0 degree) was used to examine the areas described above after larynx and supraglottis were suspended. A cup forcep was used to obtain biopsies and specimen passed off the field, including of vocal fold and suspicious supraglottic area of extent as above. Hemostasis was achieved with afrin pledgets. The hypopharynx and oropharynx were suctioned. 4% Lidocaine was sprayed on the vocal folds. All instruments were removed.  This concluded our procedure. The patient tolerated the procedure well without any apparent immediate post operative complications.  The patient was rotated back to their original position, gently awakened from general anesthesia and taken to the PACU in stable condition. I was present and participated through the entirety of the procedure.   Follow up: will call with biopsy results  Read Drivers

## 2023-01-24 NOTE — Anesthesia Postprocedure Evaluation (Signed)
Anesthesia Post Note  Patient: SHEM KLOUDA  Procedure(s) Performed: DIRECT LARYNGOSCOPY (Mouth)     Patient location during evaluation: PACU Anesthesia Type: General Level of consciousness: awake and alert Pain management: pain level controlled Vital Signs Assessment: post-procedure vital signs reviewed and stable Respiratory status: spontaneous breathing, nonlabored ventilation, respiratory function stable and patient connected to nasal cannula oxygen Cardiovascular status: blood pressure returned to baseline and stable Postop Assessment: no apparent nausea or vomiting Anesthetic complications: no   No notable events documented.  Last Vitals:  Vitals:   01/24/23 1845 01/24/23 1850  BP: (!) 154/73 (!) 158/74  Pulse: 64 66  Resp: 10 17  Temp:  36.7 C  SpO2: 98% 98%    Last Pain:  Vitals:   01/24/23 1845  TempSrc:   PainSc: 5                  Marius Betts S

## 2023-01-24 NOTE — Anesthesia Preprocedure Evaluation (Addendum)
Anesthesia Evaluation  Patient identified by MRN, date of birth, ID band Patient awake    Reviewed: Allergy & Precautions, NPO status , Patient's Chart, lab work & pertinent test results  History of Anesthesia Complications Negative for: history of anesthetic complications  Airway Mallampati: II  TM Distance: >3 FB Neck ROM: Full    Dental  (+) Edentulous Upper, Edentulous Lower   Pulmonary COPD, former smoker   Pulmonary exam normal        Cardiovascular hypertension, Pt. on medications + Peripheral Vascular Disease  Normal cardiovascular exam     Neuro/Psych negative neurological ROS  negative psych ROS   GI/Hepatic Neg liver ROS,GERD  Controlled,,  Endo/Other  diabetes, Type 2    Renal/GU CRFRenal disease     Musculoskeletal  (+) Arthritis ,    Abdominal   Peds  Hematology negative hematology ROS (+)   Anesthesia Other Findings   Reproductive/Obstetrics                             Anesthesia Physical Anesthesia Plan  ASA: 3  Anesthesia Plan: General   Post-op Pain Management: Tylenol PO (pre-op)*   Induction: Intravenous  PONV Risk Score and Plan: 2 and Treatment may vary due to age or medical condition, Ondansetron and Dexamethasone  Airway Management Planned: Oral ETT  Additional Equipment: None  Intra-op Plan:   Post-operative Plan: Extubation in OR  Informed Consent: I have reviewed the patients History and Physical, chart, labs and discussed the procedure including the risks, benefits and alternatives for the proposed anesthesia with the patient or authorized representative who has indicated his/her understanding and acceptance.     Dental advisory given  Plan Discussed with: CRNA and Anesthesiologist  Anesthesia Plan Comments:        Anesthesia Quick Evaluation

## 2023-01-24 NOTE — Discharge Instructions (Addendum)
Post Anesthesia Guidelines  During recovery from anesthesia  You may feel drowsy and reflexes may be slowed for 24 hours:  -Do not drive, use machinery, appliances, ride bicycles or scooters  -Do not consume alcohol  -Do not make important decisions   Eating and drinking:  -Drink plenty of liquids today  -Avoid fried or spicy foods today  -Return to your regular diet slowly over the next 24 hours  -Please call if unable to keep fluids down  If your throat is sore: -A breathing tube may have been used during your surgery -Drink cool liquids or gargle with warm salt water - Some blood drainage from mouth or bloody cough is expected for first few days  Surgery Discharge Instructions:  Call clinic or return to ED if you: - develop a fever greater than 101.4 - have shaking chills or are feeling ill - become significantly short of breath - have uncontrollable nausea or vomiting - Significant profuse bleeding (some bloody drainage or cough is expected for first few days) - any other acute events, problems, or concerns   Medications: - Resume your regular home medications except as detailed in the medication reconciliation.  - For pain, take tylenol 1000mg  every 6 hours as needed. If that is not sufficient, a stronger pain medication (oxycodone - every 6 hours as needed) has been prescribed to you. Do not mix with any other narcotic medication.   - Call [facility] at [phone] to schedule your CT scan I in 48 hours. Before CT scan, . If you are unable to complete your imaging study prior to your next scheduled visit please call our office to let us know.  You need to stop lisinopril before and after CT scan - 24 hours before the CT scan and 24 hours after - the lisinopril needs to be held.  Then he needs at least of normal saline before and after CT scan with contrast - tell the radiology nurse this before he gets his CT scan    Follow Up:  - Dr. Allena Katz  will call you with results of the biopsy. It can take up to 10 days for results to come back  Activity/Restrictions:  - Resume your regular activities, as tolerated.   Diet: - Resume your regular diet, as tolerated  Additional Instructions: - Please take an over the counter stool softener while taking narcotic pain medication - DO NOT MIX NARCOTIC PAIN MEDICATIONS OR TAKE NARCOTIC PRESCRIPTIONS AT THE SAME TIME - DO NOT DRIVE OR OPERATE HEAVY MACHINERY WHILE ON NARCOTICS  - DO NOT TAKE MORE THAN 4 GRAMS (4000mg ) OF TYLENOL (ACETAMINOPHEN) IN 24 HOURS

## 2023-01-25 ENCOUNTER — Encounter (HOSPITAL_COMMUNITY): Payer: Self-pay | Admitting: Otolaryngology

## 2023-01-25 DIAGNOSIS — J383 Other diseases of vocal cords: Secondary | ICD-10-CM

## 2023-01-28 LAB — SURGICAL PATHOLOGY

## 2023-02-03 DIAGNOSIS — C801 Malignant (primary) neoplasm, unspecified: Secondary | ICD-10-CM

## 2023-02-03 HISTORY — DX: Malignant (primary) neoplasm, unspecified: C80.1

## 2023-02-05 MED ORDER — SODIUM CHLORIDE 0.9 % IV SOLN: INTRAVENOUS | Status: AC

## 2023-02-06 ENCOUNTER — Encounter: Payer: Self-pay | Admitting: Radiation Oncology

## 2023-02-06 ENCOUNTER — Telehealth (INDEPENDENT_AMBULATORY_CARE_PROVIDER_SITE_OTHER): Payer: Self-pay | Admitting: Otolaryngology

## 2023-02-06 DIAGNOSIS — C320 Malignant neoplasm of glottis: Secondary | ICD-10-CM | POA: Insufficient documentation

## 2023-02-06 DIAGNOSIS — C329 Malignant neoplasm of larynx, unspecified: Secondary | ICD-10-CM

## 2023-02-06 NOTE — Telephone Encounter (Addendum)
ENT Telephone note: Called to discuss biopsy results. Patient was understandably dissappointed. I also spoke with his wife.  Likely T2 -- no cord fixation or subglottic extent on DL We discussed options: larynx sparing v/s surgical -- he is overall relatively frail and I wonder how well he would do or even be a candidate for endoscopic/laser excision. Given the endophytic nature (especially as the tumor goes over false vocal fold towards the arytenoid), it may be difficult to excise. The other option would be a total laryngectomy. Regardless, I do think he would aspirate and given his smoking history and general frailness, I do think recovery would be somewhat challenging. His voice would also be breathy most likely. Do not think he is a candidate for vertical partial laryngectomy. He vehemently did not wish for a total laryngectomy.  Regardless, I did give him the option for a surgical consultation for further discussionbut he declined.   He wished to pursue radiation -- and we discussed that this would also alter his swallowing and voice. He still wished to pursue radiation  Discussed case at tumor board as well this morning, and he is a candidate for larynx sparing treatment  He also has a CT coming up tomorrow - reported he should stop his lisinopril today and tomorrow (he will) and will get 500cc fluids before and after CT  Will make referrals. Advised him to call back with any questions  Read Drivers

## 2023-02-07 ENCOUNTER — Ambulatory Visit (HOSPITAL_COMMUNITY)
Admission: RE | Admit: 2023-02-07 | Discharge: 2023-02-07 | Disposition: A | Discharge status: Home / Self Care | Payer: Medicare Other | Source: Ambulatory Visit | Attending: Otolaryngology | Admitting: Otolaryngology

## 2023-02-07 ENCOUNTER — Encounter (HOSPITAL_COMMUNITY): Payer: Self-pay

## 2023-02-07 DIAGNOSIS — I6521 Occlusion and stenosis of right carotid artery: Secondary | ICD-10-CM | POA: Diagnosis not present

## 2023-02-07 DIAGNOSIS — R490 Dysphonia: Secondary | ICD-10-CM | POA: Diagnosis not present

## 2023-02-07 DIAGNOSIS — J383 Other diseases of vocal cords: Secondary | ICD-10-CM | POA: Diagnosis not present

## 2023-02-07 DIAGNOSIS — E042 Nontoxic multinodular goiter: Secondary | ICD-10-CM | POA: Diagnosis not present

## 2023-02-07 DIAGNOSIS — D1800 Hemangioma unspecified site: Secondary | ICD-10-CM | POA: Diagnosis not present

## 2023-02-07 MED ORDER — IOHEXOL 300 MG/ML  SOLN
60.0000 mL | Freq: Once | INTRAMUSCULAR | Status: AC | PRN
  Administered 2023-02-07: 60 mL via INTRAVENOUS

## 2023-02-07 MED ORDER — SODIUM CHLORIDE 0.9 % IV SOLN
INTRAVENOUS | Status: AC
  Filled 2023-02-07: qty 500

## 2023-02-08 NOTE — Progress Notes (Signed)
Head and Neck Cancer Location of Tumor / Histology:  Malignant neoplasm of glottis  Patient presented with symptoms of: per Dr. Eliane Decree 01/17/23 office note: "referred by Dr. Heide Spark for evaluation of dysphonia. Reports that has had persistent dysphonia, started in about August"  CT Neck w/ Contrast 02/07/2023 The patient's larynx mass is not detectable, possibly due to the degree of motion artifact. No evidence of submucosal tumor, paresis, or adenopathy. Extensive atherosclerosis, flow reducing stenosis suspected at the left subclavian origin and proximal right ICA. Thyroid nodules measuring up to 2.5 cm. Recommend thyroid ultrasound.  Biopsies revealed:  01/24/2023 A. VOCAL CORD, RIGHT FOLD, BIOPSY:       Invasive squamous cell carcinoma, moderately differentiated.  B. SUPRAGLOTTIS, RIGHT, BIOPSY:       Invasive squamous cell carcinoma, moderately differentiated.  COMMENT:  The case was peer-reviewed by Dr. Venetia Night who agrees with the  diagnosis of invasive squamous cell carcinoma   Nutrition Status Yes No Comments  Weight changes? [x]  []  Reports   Swallowing concerns? []  [x]    PEG? []  [x]     Referrals Yes No Comments  Social Work? [x]  []    Dentistry? []  [x]  Has full set of dentures   Swallowing therapy? [x]  []    Nutrition? [x]  []    Med/Onc? []  [x]     Safety Issues Yes No Comments  Prior radiation? []  [x]    Pacemaker/ICD? []  [x]    Possible current pregnancy? []  [x]  N/A  Is the patient on methotrexate? []  [x]     Tobacco/Marijuana/Snuff/ETOH use: 45 pack year smoking history, quit 9 years ago. Denies any current alcohol consumption or recreational drug use  Past/Anticipated interventions by otolaryngology, if any:  02/06/2023 --Dr. Jovita Kussmaul (telephone encounter) Called to discuss biopsy results. Patient was understandably dissappointed. I also spoke with his wife.  Likely T2 -- no cord fixation or subglottic extent on DL We discussed options: larynx sparing  v/s surgical -- he is overall relatively frail and I wonder how well he would do or even be a candidate for endoscopic/laser excision. Given the endophytic nature (especially as the tumor goes over false vocal fold towards the arytenoid), it may be difficult to excise. The other option would be a total laryngectomy. Regardless, I do think he would aspirate and given his smoking history and general frailness, I do think recovery would be somewhat challenging. His voice would also be breathy most likely. Do not think he is a candidate for vertical partial laryngectomy. He vehemently did not wish for a total laryngectomy.  Regardless, I did give him the option for a surgical consultation for further discussionbut he declined.  He wished to pursue radiation -- and we discussed that this would also alter his swallowing and voice. He still wished to pursue radiation  01/24/2023 --Dr. Jovita Kussmaul Suspension Microdirect Laryngoscopy with Biopsy using Operating Telescope   Past/Anticipated interventions by medical oncology, if any:  No referral at this time   Current Complaints / other details:  Nothing else of note

## 2023-02-10 NOTE — Progress Notes (Incomplete)
Radiation Oncology         [phone] ________________________________  Initial Outpatient Consultation  Name: William Perkins MRN: [id number]  Date: 02/11/2023  DOB: 1950-12-24  CC:[name], DO  Read Drivers, MD   REFERRING PHYSICIAN: Read Drivers, MD  DIAGNOSIS: No diagnosis found.  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 male who presented to his PCP this past August-September 2024 with new onset vocal hoarseness / dysphonia.  His symptoms persisted and he was subsequently referred to Dr. Allena Katz at [facility] on 01/17/23 for further evaluation and management. Laryngoscopy performed at that time revealed a right vocal fold exophytic mass which appeared to involve the commissure and extend posteriorly. Some mucosal changes involving the right petiole were demonstrated as well.   He accordingly underwent a direct laryngoscopy for biopsies of the right vocal fold mass and right supraglottis on 01/24/23 under the care of Dr. Allena Katz. Both of these biopsies showed findings consistent with moderately differentiated invasive squamous cell carcinoma.   Pertinent imaging performed thus far includes a soft tissue neck CT with contrast on 02/07/23. Results are pending at this time ***.   Swallowing issues, if any: none  Weight Changes: none  Pain status: ***  Other symptoms: presented with persistent vocal hoarseness which began in August of 2024   Tobacco history, if any: 45 pack year smoking history; quit 9 years ago   ETOH abuse, if any: none  Prior  cancers, if any: none  PREVIOUS RADIATION THERAPY: No  PAST MEDICAL HISTORY:  has a past medical history of Chronic renal insufficiency (03/21/2006), DDD (degenerative disc disease), cervical, GERD (03/22/2006), GERD (gastroesophageal reflux disease) (03/22/2006), Hand numbness (10/07/2009), Hyperlipidemia (03/22/2008), Hypertension, Hypertension (03/22/2006), Left ventricular hypertrophy (03/22/2006), Leg pain, left (11/15/2009), Lumbar strain (07/15/2008), Renal insufficiency, Soft tissue disorder (08/2008), and Tobacco abuse (03/22/2006).    PAST SURGICAL HISTORY: Past Surgical History:  Procedure Laterality Date   ABDOMINAL AORTOGRAM W/LOWER EXTREMITY N/A 05/18/2020   Procedure: ABDOMINAL AORTOGRAM W/LOWER EXTREMITY;  Surgeon: Leonie Douglas, MD;  Location: [facility];  Service: Cardiovascular;  Laterality: N/A;   BACK SURGERY  2020   PLIF   COLONOSCOPY     DIRECT LARYNGOSCOPY N/A 01/24/2023   Procedure: DIRECT LARYNGOSCOPY;  Surgeon: Read Drivers, MD;  Location: [facility] OR;  Service: ENT;  Laterality: N/A;  DIRECT LARYNGOSCOPY WITH BIOPSY OF VOCAL CORD LESION   ENDARTERECTOMY FEMORAL Right 05/20/2020   Procedure: RIGHT EXTENDED FEMORAL ENDARTERECTOMY AND PROFUNDAPLASTY;  Surgeon: Leonie Douglas, MD;  Location: [facility] OR;  Service: Vascular;  Laterality: Right;   WISDOM TOOTH EXTRACTION      FAMILY HISTORY: family history includes Diabetes in his brother, mother, and sister; Heart disease in his brother.  SOCIAL HISTORY:  reports that he quit smoking about 9 years ago. His smoking use included cigarettes. He started smoking about 53 years ago. He has a 44 pack-year smoking history. He has never used smokeless tobacco. He reports that he does not drink alcohol and does not use drugs.  ALLERGIES: Patient has no known allergies.  MEDICATIONS:  Current  Outpatient Medications  Medication Sig Dispense Refill   acetaminophen (TYLENOL) 500 MG tablet Take 1,000 mg by mouth every 6  (six) hours as needed for mild pain or moderate pain.     amLODipine (NORVASC) 10 MG tablet Take 1 tablet (10 mg total) by mouth daily. 90 tablet 3   aspirin EC 81 MG tablet Take 81 mg by mouth daily. Swallow whole.     cloNIDine (CATAPRES) 0.1 MG tablet Take 1 tablet (0.1 mg total) by mouth 2 (two) times daily. 180 tablet 3   hydrALAZINE (APRESOLINE) 50 MG tablet Take 50 mg by mouth 2 (two) times daily.     lisinopril (ZESTRIL) 40 MG tablet Take 1 tablet (40 mg total) by mouth daily. 90 tablet 3   oxyCODONE (ROXICODONE) 5 MG immediate release tablet Take 1 tablet (5 mg total) by mouth every 6 (six) hours as needed for severe pain (pain score 7-10). 30 tablet 0   polyethylene glycol (MIRALAX / GLYCOLAX) 17 g packet Take 17 g by mouth 2 (two) times daily. (Patient taking differently: Take 17 g by mouth daily as needed for mild constipation or moderate constipation.) 14 each 25   rosuvastatin (CRESTOR) 20 MG tablet Take 20 mg by mouth daily.     sodium bicarbonate 650 MG tablet Take 2 tablets (1,300 mg total) by mouth 2 (two) times daily. 360 tablet 3   No current facility-administered medications for this encounter.    REVIEW OF SYSTEMS:  Notable for that above.   PHYSICAL EXAM:  vitals were not taken for this visit.   General: Alert and oriented, in no acute distress HEENT: Head is normocephalic. Extraocular movements are intact. Oropharynx is notable for ***. Neck: Neck is notable for *** Heart: Regular in rate and rhythm with no murmurs, rubs, or gallops. Chest: Clear to auscultation bilaterally, with no rhonchi, wheezes, or rales. Abdomen: Soft, nontender, nondistended, with no rigidity or guarding. Extremities: No cyanosis or edema. Lymphatics: see Neck Exam Skin: No concerning lesions. Musculoskeletal: symmetric strength and muscle tone throughout. Neurologic: Cranial nerves II through XII are grossly intact. No obvious focalities. Speech is fluent. Coordination is  intact. Psychiatric: Judgment and insight are intact. Affect is appropriate.   ECOG = ***  0 - Asymptomatic (Fully active, able to carry on all predisease activities without restriction)  1 - Symptomatic but completely ambulatory (Restricted in physically strenuous activity but ambulatory and able to carry out work of a light or sedentary nature. For example, light housework, office work)  2 - Symptomatic, <50% in bed during the day (Ambulatory and capable of all self care but unable to carry out any work activities. Up and about more than 50% of waking hours)  3 - Symptomatic, >50% in bed, but not bedbound (Capable of only limited self-care, confined to bed or chair 50% or more of waking hours)  4 - Bedbound (Completely disabled. Cannot carry on any self-care. Totally confined to bed or chair)  5 - Death   Santiago Glad MM, Creech RH, Tormey DC, et al. [phone]). "Toxicity and response criteria of the [facility]". Am. Evlyn Clines. Oncol. 5 (6): 649-55   LABORATORY DATA:  Lab Results  Component Value Date   WBC 6.2 01/24/2023   HGB 11.2 (L) 01/24/2023   HCT 34.9 (L) 01/24/2023   MCV 90.4 01/24/2023   PLT 244 01/24/2023   CMP     Component Value Date/Time   NA 136 01/24/2023 1253   NA 138 03/22/2022 1152  K 3.5 01/24/2023 1253   CL 103 01/24/2023 1253   CO2 20 (L) 01/24/2023 1253   GLUCOSE 113 (H) 01/24/2023 1253   BUN 27 (H) 01/24/2023 1253   BUN 25 03/22/2022 1152   CREATININE 2.44 (H) 01/24/2023 1253   CREATININE 1.78 (H) 06/24/2014 1640   CALCIUM 9.2 01/24/2023 1253   PROT 6.5 05/20/2020 0557   PROT 7.0 01/20/2015 1629   ALBUMIN 3.6 05/20/2020 0557   ALBUMIN 4.2 01/20/2015 1629   AST 18 05/20/2020 0557   ALT 17 05/20/2020 0557   ALKPHOS 44 05/20/2020 0557   BILITOT 0.6 05/20/2020 0557   BILITOT 0.4 01/20/2015 1629   GFRNONAA 27 (L) 01/24/2023 1253   GFRNONAA 43 (L) 01/07/2014 1353   GFRAA 43 (L) 04/07/2020 1409   GFRAA 50 (L) 01/07/2014 1353       Lab Results  Component Value Date   TSH 0.384 01/07/2014     RADIOGRAPHY: No results found.    IMPRESSION/PLAN:  This is a delightful patient with head and neck cancer. I *** recommend radiotherapy for this patient.  We discussed the potential risks, benefits, and side effects of radiotherapy. We talked in detail about acute and late effects. We discussed that some of the most bothersome acute effects may be mucositis, dysgeusia, salivary changes, skin irritation, hair loss, dehydration, weight loss and fatigue. We talked about late effects which include but are not necessarily limited to dysphagia, hypothyroidism, nerve injury, vascular injury, spinal cord injury, xerostomia, trismus, neck edema, dental issues, non-healing wound, and potentially fatal injury to any of the tissues in the head and neck region. No guarantees of treatment were given. A consent form was signed and placed in the patient's medical record. The patient is enthusiastic about proceeding with treatment. I look forward to participating in the patient's care.    Simulation (treatment planning) will take place ***  We also discussed that the treatment of head and neck cancer is a multidisciplinary process to maximize treatment outcomes and quality of life. For this reason the following referrals have been or will be made:  *** Medical oncology to discuss chemotherapy   *** Dentistry for dental evaluation, possible extractions in the radiation fields, and /or advice on reducing risk of cavities, osteoradionecrosis, or other oral issues.  *** Nutritionist for nutrition support during and after treatment.  *** Speech language pathology for swallowing and/or speech therapy.  *** Social work for social support.   *** Physical therapy due to risk of lymphedema in neck and deconditioning.  *** Baseline labs including TSH.  On date of service, in total, I spent *** minutes on this encounter. Patient was seen in  person.  __________________________________________   Lonie Peak, MD  This document serves as a record of services personally performed by Lonie Peak, MD. It was created on her behalf by Neena Rhymes, a trained medical scribe. The creation of this record is based on the scribe's personal observations and the provider's statements to them. This document has been checked and approved by the attending provider.

## 2023-02-11 ENCOUNTER — Ambulatory Visit
Admission: RE | Admit: 2023-02-11 | Discharge: 2023-02-11 | Disposition: A | Discharge status: Home / Self Care | Payer: Medicare Other | Source: Ambulatory Visit | Attending: Radiation Oncology | Admitting: Radiation Oncology

## 2023-02-11 ENCOUNTER — Encounter: Payer: Self-pay | Admitting: Radiation Oncology

## 2023-02-11 ENCOUNTER — Other Ambulatory Visit: Payer: Self-pay

## 2023-02-11 VITALS — BP 121/61 | HR 59 | Temp 98.1°F | Resp 16 | Wt 137.5 lb

## 2023-02-11 DIAGNOSIS — C320 Malignant neoplasm of glottis: Secondary | ICD-10-CM

## 2023-02-11 NOTE — Progress Notes (Signed)
Oncology Nurse Navigator Documentation   Met with patient during initial consult with Dr. Basilio Cairo.  He was accompanied by his wife, Drinda Butts. I introduced myself as his/their Navigator, explained my role as a member of the Care Team. Provided New Patient resource guide binder: Contact information for physicians, this navigator, other members of the Care Team Advance Directive information; provided [facility] AD booklet at their request,  Fall Prevention Patient Safety Plan Financial Assistance Information sheet Symptom Management Clinic information [facility] campus map with highlight of [facility] Information sheet Head and Neck cancer basics Nutrition information Patient and family support information including Spiritual care/Chaplain information, Peer mentor program, health and wellness classes, and the survivorship program Community resources  Assisted with post-consult appt scheduling.  They verbalized understanding of information provided. I encouraged them to call with questions/concerns moving forward. He will be scheduled for CT simulation/radiation planning soon.   Hedda Slade, RN, BSN, OCN Head & Neck Oncology Nurse Navigator [facility] at [place] [phone]

## 2023-02-12 ENCOUNTER — Other Ambulatory Visit: Payer: Self-pay

## 2023-02-12 DIAGNOSIS — C320 Malignant neoplasm of glottis: Secondary | ICD-10-CM

## 2023-02-15 ENCOUNTER — Ambulatory Visit: Payer: Medicare Other

## 2023-02-15 ENCOUNTER — Ambulatory Visit: Admission: RE | Admit: 2023-02-15 | Payer: Medicare Other | Source: Ambulatory Visit | Admitting: Radiation Oncology

## 2023-02-15 ENCOUNTER — Ambulatory Visit
Admission: RE | Admit: 2023-02-15 | Discharge: 2023-02-15 | Disposition: A | Discharge status: Home / Self Care | Payer: Medicare Other | Source: Ambulatory Visit | Attending: Radiation Oncology | Admitting: Radiation Oncology

## 2023-02-15 DIAGNOSIS — E041 Nontoxic single thyroid nodule: Secondary | ICD-10-CM | POA: Insufficient documentation

## 2023-02-15 DIAGNOSIS — Z510 Encounter for antineoplastic radiation therapy: Secondary | ICD-10-CM | POA: Insufficient documentation

## 2023-02-15 DIAGNOSIS — C320 Malignant neoplasm of glottis: Secondary | ICD-10-CM | POA: Insufficient documentation

## 2023-02-15 LAB — TSH: TSH: 0.87 u[IU]/mL (ref 0.350–4.500)

## 2023-02-15 NOTE — Progress Notes (Signed)
Oncology Nurse Navigator Documentation   To provide support, encouragement and care continuity, met with William Perkins and his wife after after his CT SIM.  He tolerated procedure without difficulty, denied questions/concerns. I walked with him upstairs to help him check in for his TSH lab.  I encouraged him to call me prior to his 12/23 [facility].   Hedda Slade RN, BSN, OCN Head & Neck Oncology Nurse Navigator [facility] at [facility] Phone # [phone]  Fax # [phone]

## 2023-02-19 ENCOUNTER — Inpatient Hospital Stay: Payer: Medicare Other | Attending: Radiation Oncology

## 2023-02-19 ENCOUNTER — Other Ambulatory Visit: Payer: Self-pay

## 2023-02-19 DIAGNOSIS — C320 Malignant neoplasm of glottis: Secondary | ICD-10-CM

## 2023-02-19 DIAGNOSIS — N1832 Chronic kidney disease, stage 3b: Secondary | ICD-10-CM | POA: Diagnosis not present

## 2023-02-19 NOTE — Progress Notes (Signed)
[facility] Clinical Social Work  Initial Assessment   William Perkins is a 72 y.o. year old male contacted by phone. Clinical Social Work was referred by nurse navigator for assessment of psychosocial needs.   SDOH (Social Determinants of Health) assessments performed: Yes SDOH Interventions    Flowsheet Row Clinical Support from 09/19/2022 in [facility] - A Dept Of [place]. [facility] Office Visit from 05/19/2021 in [facility] - A Dept Of [place]. [facility]  SDOH Interventions    Food Insecurity Interventions Intervention Not Indicated --  Housing Interventions Intervention Not Indicated --  Transportation Interventions Intervention Not Indicated Intervention Not Indicated  Utilities Interventions Intervention Not Indicated --  Alcohol Usage Interventions Intervention Not Indicated (Score <7) --  Depression Interventions/Treatment  PHQ2-9 Score <4 Follow-up Not Indicated --  Financial Strain Interventions Intervention Not Indicated --  Physical Activity Interventions Intervention Not Indicated --  Stress Interventions Intervention Not Indicated Intervention Not Indicated  Social Connections Interventions Intervention Not Indicated Intervention Not Indicated  Health Literacy Interventions Intervention Not Indicated --       SDOH Screenings   Food Insecurity: No Food Insecurity (02/19/2023)  Housing: Low Risk  (02/19/2023)  Transportation Needs: No Transportation Needs (02/19/2023)  Utilities: Not At Risk (02/19/2023)  Alcohol Screen: Low Risk  (09/19/2022)  Depression (PHQ2-9): Low Risk  (02/19/2023)  Financial Resource Strain: Low Risk  (02/19/2023)  Physical Activity: Insufficiently Active (09/19/2022)  Social Connections: Moderately Isolated (09/19/2022)  Stress: No Stress Concern Present (09/19/2022)  Tobacco Use: Medium Risk (02/11/2023)  Health Literacy: Adequate Health Literacy (09/19/2022)     Distress Screen completed:  No     No data to display            Family/Social Information:  Housing Arrangement: patient lives with spouse. Family members/support persons in your life? Family Transportation concerns: no  Employment: Disabled since 2019.  Income source: Special educational needs teacher Income Financial concerns: No Type of concern: None Food access concerns: no Religious or spiritual practice: Not known Services Currently in place:  Insurance, Family, Income   Coping/ Adjustment to diagnosis: Patient understands treatment plan and what happens next? yes Concerns about diagnosis and/or treatment: I'm not especially worried about anything Patient reported stressors:  No Stressors reported at time of visit. Hopes and/or priorities: To complete treatment. Current coping skills/ strengths: Ability for insight , Active sense of humor , Average or above average intelligence , Capable of independent living , Communication skills , General fund of knowledge , Motivation for treatment/growth , and Supportive family/friends     SUMMARY: Current SDOH Barriers:  No SDOH Barriers identified at time of call.   Clinical Social Work Clinical Goal(s):  No clinical social work goals at this time  Interventions: Discussed common feeling and emotions when being diagnosed with cancer, and the importance of support during treatment Informed patient of the support team roles and support services at [facility] Provided CSW contact information and encouraged patient to call with any questions or concerns   Follow Up Plan: Patient will contact CSW with any support or resource needs Patient verbalizes understanding of plan: Yes    Marguerita Merles, LCSW Clinical Social Worker [facility] [phone]

## 2023-02-22 DIAGNOSIS — Z510 Encounter for antineoplastic radiation therapy: Secondary | ICD-10-CM | POA: Diagnosis not present

## 2023-02-22 DIAGNOSIS — C320 Malignant neoplasm of glottis: Secondary | ICD-10-CM | POA: Diagnosis not present

## 2023-02-22 DIAGNOSIS — E041 Nontoxic single thyroid nodule: Secondary | ICD-10-CM | POA: Diagnosis not present

## 2023-02-25 ENCOUNTER — Other Ambulatory Visit: Payer: Self-pay

## 2023-02-25 ENCOUNTER — Ambulatory Visit
Admission: RE | Admit: 2023-02-25 | Discharge: 2023-02-25 | Disposition: A | Discharge status: Home / Self Care | Payer: Medicare Other | Source: Ambulatory Visit | Attending: Radiation Oncology | Admitting: Radiation Oncology

## 2023-02-25 ENCOUNTER — Other Ambulatory Visit: Payer: Self-pay | Admitting: Radiation Oncology

## 2023-02-25 DIAGNOSIS — D631 Anemia in chronic kidney disease: Secondary | ICD-10-CM | POA: Diagnosis not present

## 2023-02-25 DIAGNOSIS — N189 Chronic kidney disease, unspecified: Secondary | ICD-10-CM | POA: Diagnosis not present

## 2023-02-25 DIAGNOSIS — C320 Malignant neoplasm of glottis: Secondary | ICD-10-CM

## 2023-02-25 DIAGNOSIS — N184 Chronic kidney disease, stage 4 (severe): Secondary | ICD-10-CM | POA: Diagnosis not present

## 2023-02-25 DIAGNOSIS — E041 Nontoxic single thyroid nodule: Secondary | ICD-10-CM | POA: Diagnosis not present

## 2023-02-25 DIAGNOSIS — E559 Vitamin D deficiency, unspecified: Secondary | ICD-10-CM | POA: Diagnosis not present

## 2023-02-25 DIAGNOSIS — I129 Hypertensive chronic kidney disease with stage 1 through stage 4 chronic kidney disease, or unspecified chronic kidney disease: Secondary | ICD-10-CM | POA: Diagnosis not present

## 2023-02-25 DIAGNOSIS — Z510 Encounter for antineoplastic radiation therapy: Secondary | ICD-10-CM | POA: Diagnosis not present

## 2023-02-25 LAB — RAD ONC ARIA SESSION SUMMARY
Course Elapsed Days: 0
Plan Fractions Treated to Date: 1
Plan Prescribed Dose Per Fraction: 2.25 Gy
Plan Total Fractions Prescribed: 29
Plan Total Prescribed Dose: 65.25 Gy
Reference Point Dosage Given to Date: 2.25 Gy
Reference Point Session Dosage Given: 2.25 Gy
Session Number: 1

## 2023-02-25 MED ORDER — LIDOCAINE VISCOUS HCL 2 % MT SOLN: OROMUCOSAL | 3 refills | Status: DC

## 2023-02-25 NOTE — Progress Notes (Signed)
Oncology Nurse Navigator Documentation   To provide support, encouragement and care continuity, met with William Perkins for his initial RT.  He was accompanied by his wife. I reviewed the 2-step treatment process, answered questions.  Mr. Carmical completed treatment without difficulty, denied questions/concerns. I reviewed the registration/arrival procedure for subsequent treatments. I encouraged them to call me with questions/concerns as treatments proceed.   Hedda Slade RN, BSN, OCN Head & Neck Oncology Nurse Navigator [facility] at [facility] Phone # [phone]  Fax # [phone]

## 2023-02-26 ENCOUNTER — Ambulatory Visit
Admission: RE | Admit: 2023-02-26 | Discharge: 2023-02-26 | Disposition: A | Discharge status: Home / Self Care | Payer: Medicare Other | Source: Ambulatory Visit | Attending: Radiation Oncology

## 2023-02-26 ENCOUNTER — Other Ambulatory Visit: Payer: Self-pay

## 2023-02-26 DIAGNOSIS — E041 Nontoxic single thyroid nodule: Secondary | ICD-10-CM | POA: Diagnosis not present

## 2023-02-26 DIAGNOSIS — Z510 Encounter for antineoplastic radiation therapy: Secondary | ICD-10-CM | POA: Diagnosis not present

## 2023-02-26 DIAGNOSIS — C320 Malignant neoplasm of glottis: Secondary | ICD-10-CM | POA: Diagnosis not present

## 2023-02-26 LAB — RAD ONC ARIA SESSION SUMMARY
Course Elapsed Days: 1
Plan Fractions Treated to Date: 2
Plan Prescribed Dose Per Fraction: 2.25 Gy
Plan Total Fractions Prescribed: 29
Plan Total Prescribed Dose: 65.25 Gy
Reference Point Dosage Given to Date: 4.5 Gy
Reference Point Session Dosage Given: 2.25 Gy
Session Number: 2

## 2023-02-28 ENCOUNTER — Ambulatory Visit
Admission: RE | Admit: 2023-02-28 | Discharge: 2023-02-28 | Disposition: A | Discharge status: Home / Self Care | Payer: Medicare Other | Source: Ambulatory Visit | Attending: Radiation Oncology | Admitting: Radiation Oncology

## 2023-02-28 ENCOUNTER — Inpatient Hospital Stay: Payer: Medicare Other | Admitting: Dietician

## 2023-02-28 ENCOUNTER — Other Ambulatory Visit: Payer: Self-pay

## 2023-02-28 ENCOUNTER — Telehealth: Payer: Self-pay | Admitting: Dietician

## 2023-02-28 DIAGNOSIS — Z510 Encounter for antineoplastic radiation therapy: Secondary | ICD-10-CM | POA: Diagnosis not present

## 2023-02-28 DIAGNOSIS — C320 Malignant neoplasm of glottis: Secondary | ICD-10-CM | POA: Diagnosis not present

## 2023-02-28 DIAGNOSIS — E041 Nontoxic single thyroid nodule: Secondary | ICD-10-CM | POA: Diagnosis not present

## 2023-02-28 LAB — RAD ONC ARIA SESSION SUMMARY
Course Elapsed Days: 3
Plan Fractions Treated to Date: 3
Plan Prescribed Dose Per Fraction: 2.25 Gy
Plan Total Fractions Prescribed: 29
Plan Total Prescribed Dose: 65.25 Gy
Reference Point Dosage Given to Date: 6.75 Gy
Reference Point Session Dosage Given: 2.25 Gy
Session Number: 3

## 2023-02-28 NOTE — Telephone Encounter (Signed)
Nutrition Assessment  Reason for Assessment: HNC  ASSESSMENT: 71 year old male with stage II glottis cancer. Patient is receiving radiation therapy under the care of Dr. Basilio Cairo.  Past medical history includes DDD, GERD, HTN, HLD, chronic renal insufficiency  Spoke with patient via telephone. He reports doing well and denies nutrition impact symptoms at this time. Patient reports good appetite and eating 2 meals as usual. Patient typically eats eggs, sausage, and grits for breakfast. He may snack on ritz crackers and peanut butter in the afternoon. Recalls balanced supper meal (chicken, greens, yams). Patient drinks a cup of coffee and glass of whole milk at breakfast and one soda with dinner. He reports consuming 2 bottles of water daily. Patient reports chronic constipation ongoing for years. Bowels move once a week, sometimes longer. He takes miralax as needed.    Nutrition Focused Physical Exam: deferred (telephone)    Medications: amlodipine, clonidine, lidocaine solution, zestril, oxycodone, miralax, crestor, sodium bicarbonate   Labs: 11/21 labs reviewed    Anthropometrics:   Height: 5'11" Weight: 137 lb 8 oz (12/9) UBW: 140 lb  BMI: 19.18   NUTRITION DIAGNOSIS: Food and nutrition related knowledge deficit related to [facility] as evidenced by no prior need for associated nutrition information    INTERVENTION:  Educated on importance of adequate calorie and protein energy intake to maintain LBM, wts/strength Encouraged smaller meals more often and choosing high calorie, high protein foods Discussed trying oral nutrition supplement for added nutrition  Patient will work to increase intake of water Recommend daily bowel regimen Handouts, samples of Ensure + CIB powder, coupons, contact information taken to radiation. Patient to pick this up 12/26    MONITORING, EVALUATION, GOAL: Patient will tolerate increased calories and protein to minimize wt loss during treatment   Next  Visit: Tuesday January 31 via telephone (pt aware)

## 2023-03-01 ENCOUNTER — Ambulatory Visit
Admission: RE | Admit: 2023-03-01 | Discharge: 2023-03-01 | Disposition: A | Discharge status: Home / Self Care | Payer: Medicare Other | Source: Ambulatory Visit | Attending: Radiation Oncology

## 2023-03-01 ENCOUNTER — Other Ambulatory Visit: Payer: Self-pay

## 2023-03-01 DIAGNOSIS — E041 Nontoxic single thyroid nodule: Secondary | ICD-10-CM | POA: Diagnosis not present

## 2023-03-01 DIAGNOSIS — C320 Malignant neoplasm of glottis: Secondary | ICD-10-CM | POA: Diagnosis not present

## 2023-03-01 DIAGNOSIS — Z510 Encounter for antineoplastic radiation therapy: Secondary | ICD-10-CM | POA: Diagnosis not present

## 2023-03-01 LAB — RAD ONC ARIA SESSION SUMMARY
Course Elapsed Days: 4
Plan Fractions Treated to Date: 4
Plan Prescribed Dose Per Fraction: 2.25 Gy
Plan Total Fractions Prescribed: 29
Plan Total Prescribed Dose: 65.25 Gy
Reference Point Dosage Given to Date: 9 Gy
Reference Point Session Dosage Given: 2.25 Gy
Session Number: 4

## 2023-03-04 ENCOUNTER — Ambulatory Visit
Admission: RE | Admit: 2023-03-04 | Discharge: 2023-03-04 | Disposition: A | Discharge status: Home / Self Care | Payer: Medicare Other | Source: Ambulatory Visit | Attending: Radiation Oncology

## 2023-03-04 ENCOUNTER — Other Ambulatory Visit: Payer: Self-pay

## 2023-03-04 DIAGNOSIS — E041 Nontoxic single thyroid nodule: Secondary | ICD-10-CM | POA: Diagnosis not present

## 2023-03-04 DIAGNOSIS — C320 Malignant neoplasm of glottis: Secondary | ICD-10-CM | POA: Diagnosis not present

## 2023-03-04 DIAGNOSIS — Z510 Encounter for antineoplastic radiation therapy: Secondary | ICD-10-CM | POA: Diagnosis not present

## 2023-03-04 LAB — RAD ONC ARIA SESSION SUMMARY
Course Elapsed Days: 7
Plan Fractions Treated to Date: 5
Plan Prescribed Dose Per Fraction: 2.25 Gy
Plan Total Fractions Prescribed: 29
Plan Total Prescribed Dose: 65.25 Gy
Reference Point Dosage Given to Date: 11.25 Gy
Reference Point Session Dosage Given: 2.25 Gy
Session Number: 5

## 2023-03-05 ENCOUNTER — Ambulatory Visit
Admission: RE | Admit: 2023-03-05 | Discharge: 2023-03-05 | Disposition: A | Discharge status: Home / Self Care | Payer: Medicare Other | Source: Ambulatory Visit | Attending: Radiation Oncology | Admitting: Radiation Oncology

## 2023-03-05 ENCOUNTER — Encounter: Payer: Medicare Other | Admitting: Dietician

## 2023-03-05 ENCOUNTER — Inpatient Hospital Stay: Payer: Medicare Other | Admitting: Dietician

## 2023-03-05 ENCOUNTER — Other Ambulatory Visit: Payer: Self-pay

## 2023-03-05 ENCOUNTER — Telehealth: Payer: Self-pay | Admitting: Dietician

## 2023-03-05 DIAGNOSIS — E041 Nontoxic single thyroid nodule: Secondary | ICD-10-CM | POA: Diagnosis not present

## 2023-03-05 DIAGNOSIS — C320 Malignant neoplasm of glottis: Secondary | ICD-10-CM | POA: Diagnosis not present

## 2023-03-05 DIAGNOSIS — Z510 Encounter for antineoplastic radiation therapy: Secondary | ICD-10-CM | POA: Diagnosis not present

## 2023-03-05 LAB — RAD ONC ARIA SESSION SUMMARY
Course Elapsed Days: 8
Plan Fractions Treated to Date: 6
Plan Prescribed Dose Per Fraction: 2.25 Gy
Plan Total Fractions Prescribed: 29
Plan Total Prescribed Dose: 65.25 Gy
Reference Point Dosage Given to Date: 13.5 Gy
Reference Point Session Dosage Given: 2.25 Gy
Session Number: 6

## 2023-03-05 NOTE — Telephone Encounter (Signed)
 Nutrition Follow-up:  Patient with glottis cancer. He is receiving radiation therapy.   Spoke with patient via telephone. He reports tolerating therapy well. Patient denies nutrition impact symptoms at this time. He reports eating well and tolerating regular diet without difficulty. Patient is drinking 2 bottles of water . He did not work to increase this as previously discussed. Patient states that he is also drinking soda for fluids. He has not started using baking soda salt water  rinses.    Medications: reviewed   Labs: No new labs  Anthropometrics: last wt 137.4 lb redia) on 12/23 - stable  12/9 - 137.5 lb   NUTRITION DIAGNOSIS: Food and nutrition related knowledge deficit continues    INTERVENTION:  Reinforced importance of adequate hydration, educated on empty calories/no nutrition benefits of soda. He is agreeable to reduce soda intake and increase water  Encouraged high calorie high protein foods for wt maintenance    MONITORING, EVALUATION, GOAL: wt trends, intake   NEXT VISIT: Tuesday January 7 after radiation

## 2023-03-07 ENCOUNTER — Ambulatory Visit: Payer: Medicare Other | Attending: Radiation Oncology

## 2023-03-07 ENCOUNTER — Other Ambulatory Visit: Payer: Self-pay

## 2023-03-07 ENCOUNTER — Ambulatory Visit
Admission: RE | Admit: 2023-03-07 | Discharge: 2023-03-07 | Disposition: A | Discharge status: Home / Self Care | Payer: Medicare Other | Source: Ambulatory Visit | Attending: Radiation Oncology | Admitting: Radiation Oncology

## 2023-03-07 DIAGNOSIS — E041 Nontoxic single thyroid nodule: Secondary | ICD-10-CM | POA: Diagnosis not present

## 2023-03-07 DIAGNOSIS — C320 Malignant neoplasm of glottis: Secondary | ICD-10-CM | POA: Insufficient documentation

## 2023-03-07 DIAGNOSIS — Z510 Encounter for antineoplastic radiation therapy: Secondary | ICD-10-CM | POA: Insufficient documentation

## 2023-03-07 DIAGNOSIS — R490 Dysphonia: Secondary | ICD-10-CM | POA: Diagnosis not present

## 2023-03-07 DIAGNOSIS — R1310 Dysphagia, unspecified: Secondary | ICD-10-CM | POA: Insufficient documentation

## 2023-03-07 LAB — RAD ONC ARIA SESSION SUMMARY
Course Elapsed Days: 10
Plan Fractions Treated to Date: 7
Plan Prescribed Dose Per Fraction: 2.25 Gy
Plan Total Fractions Prescribed: 29
Plan Total Prescribed Dose: 65.25 Gy
Reference Point Dosage Given to Date: 15.75 Gy
Reference Point Session Dosage Given: 2.25 Gy
Session Number: 7

## 2023-03-07 NOTE — Therapy (Signed)
 OUTPATIENT SPEECH LANGUAGE PATHOLOGY ONCOLOGY EVALUATION   Patient Name: William Perkins MRN: [id number] DOB:06-May-1950, 73 y.o., male Today's Date: 03/07/2023  PCP: Rosan Dayton MOTE MD REFERRING PROVIDER: Izell Domino, MD  END OF SESSION:  End of Session - 03/07/23 1233     Visit Number 1    Number of Visits 7    Date for SLP Re-Evaluation 06/05/23    SLP Start Time 1155    SLP Stop Time  1230    SLP Time Calculation (min) 35 min    Activity Tolerance Patient tolerated treatment well             Past Medical History:  Diagnosis Date   Chronic renal insufficiency 03/21/2006   CrCl <50 ml   DDD (degenerative disc disease), cervical    GERD 03/22/2006   Qualifier: Diagnosis of  By: [name]     GERD (gastroesophageal reflux disease) 03/22/2006   Hand numbness 10/07/2009   pt states not any longer   Hyperlipidemia 03/22/2008   Hypertension    Hypertension 03/22/2006   Left ventricular hypertrophy 03/22/2006   Leg pain, left 11/15/2009   Lumbar strain 07/15/2008   Renal insufficiency    Soft tissue disorder 08/2008   Tobacco abuse 03/22/2006   2 ppd x 30 years   Past Surgical History:  Procedure Laterality Date   ABDOMINAL AORTOGRAM W/LOWER EXTREMITY N/A 05/18/2020   Procedure: ABDOMINAL AORTOGRAM W/LOWER EXTREMITY;  Surgeon: Magda Debby SAILOR, MD;  Location: [facility];  Service: Cardiovascular;  Laterality: N/A;   BACK SURGERY  2020   PLIF   COLONOSCOPY     DIRECT LARYNGOSCOPY N/A 01/24/2023   Procedure: DIRECT LARYNGOSCOPY;  Surgeon: Tobie Eldora NOVAK, MD;  Location: [facility] OR;  Service: ENT;  Laterality: N/A;  DIRECT LARYNGOSCOPY WITH BIOPSY OF VOCAL CORD LESION   ENDARTERECTOMY FEMORAL Right 05/20/2020   Procedure: RIGHT EXTENDED FEMORAL ENDARTERECTOMY AND PROFUNDAPLASTY;  Surgeon: Magda Debby SAILOR, MD;  Location: [facility] OR;  Service: Vascular;  Laterality: Right;   WISDOM TOOTH EXTRACTION     Patient Active Problem List   Diagnosis Date Noted    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   Vocal cord mass 01/25/2023   Hoarseness, persistent 11/27/2022   PAD (peripheral artery disease) (HCC) 11/16/2019   History of colonic polyps 01/28/2018   Anemia 12/12/2017   Degenerative spondylolisthesis 12/12/2017   Vitamin D  deficiency 08/03/2016   Cervical disc disease 07/03/2016   Type 2 diabetes mellitus with stage 3 chronic kidney disease, without long-term current use of insulin  (HCC) 02/10/2015   Other emphysema (HCC) 05/22/2012   Constipation 05/22/2012   Healthcare maintenance 03/28/2012   Hyperlipidemia 03/22/2006   Essential hypertension 03/22/2006   CKD (chronic kidney disease), stage III (HCC) 03/21/2006    ONSET DATE: August-September 2024   REFERRING DIAG: Glottis Carcinoma  THERAPY DIAG:  Dysphagia, unspecified type  Hoarseness  Rationale for Evaluation and Treatment: Rehabilitation  SUBJECTIVE:   SUBJECTIVE STATEMENT: Denies any overt s/sx oral or pharyngeal dysphagia Pt accompanied by: self  PERTINENT HISTORY:  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 He presented to is PCP in August -September 2024 with new onset vocal hoarseness/dysphonia. 01/17/23 He was seen by Dr. Tobie at [facility] for persistent symptoms. Laryngoscopy performed revealed a right vocal fold exophytic mass which appeared to involve the commissure and extend posteriorly. Some mucosal changes involving the right petiole were demonstrated as well. 01/24/23 He underwent a direct laryngoscopy for biopsies of the  right vocal fold mass and right supraglottis under the care of Dr. Tobie. Both of these biopsies showed findings consistent with moderately differentiated invasive squamous cell carcinoma.  Per op note Right vocal fold exophytic lesion spanning almost entire vocal fold and involving anterior commissure. The right false vocal fold and petiole area was also suspicious and also sent for pathology as separate  specimen. Even if it returns as negative, the area is concerning for carcinoma. 02/07/23 CT neck did not show any metastatic adenopathy. Some incidental findings were noted in his thyroid  gland for which US  was recommended. 02/11/23 Consult with Dr. Izell. He will receive radiation only. Treatment plan: He will receive 29 fractions of radiation to his larynx only. He started on 12/23 and will complete 04/08/23.  PAIN:  Are you having pain? No  FALLS: Has patient fallen in last 6 months?  No  LIVING ENVIRONMENT: Lives with: lives with their family Lives in: House/apartment  PLOF:  Level of assistance: Independent with ADLs, Independent with IADLs Employment: Retired  PATIENT GOALS: maintain WNL swallowing throughout life  OBJECTIVE:  Note: Objective measures were completed at Evaluation unless otherwise noted.  COGNITION: Overall cognitive status: Within functional limits for tasks assessed   LANGUAGE: Receptive and Expressive language appeared WNL.  ORAL MOTOR EXAMINATION: Overall status: WFL   MOTOR SPEECH: Overall motor speech: Appears intact   SUBJECTIVE DYSPHAGIA REPORTS:  Date of onset: does not report any s/sx dysphagia at present  Current diet: regular and thin liquids, currently; peanut butter crackers, baked chicken, rice and beans, sausage (as examples)  Co-morbid voice changes: Yes, mod hoarse and min roughness, currently  FACTORS WHICH MAY INCREASE RISK OF ADVERSE EVENT IN PRESENCE OF ASPIRATION:  General health: well appearing  Risk factors: none evident , however is currently undergoing RT for glottis carcinoma  CLINICAL SWALLOW ASSESSMENT:   Dentition: dentures (upper) and dentures (lower)  (but not currently placed) Vocal quality at baseline: hoarse and rough Patient directly observed with POs: Yes: dysphagia 3 (soft) and thin liquids  Feeding: able to feed self Liquids provided by: cup Oral phase signs and symptoms: prolonged mastication due to  edentulous today Pharyngeal phase signs and symptoms:  none noted                                                                                                                           TREATMENT DATE:  03/07/23 (eval): Research states the risk for dysphagia increases due to radiation and/or chemotherapy treatment due to a variety of factors, so SLP educated the pt about the possibility of reduced/limited ability for PO intake during rad tx. SLP also educated pt regarding possible changes to swallowing musculature after rad tx, and why adherence to dysphagia HEP provided today and PO consumption was necessary to inhibit muscle fibrosis following rad tx. SLP informed pt why this would be detrimental to their swallowing status, voice, and to their pulmonary health. Pt demonstrated understanding of these things to SLP.  SLP encouraged pt to safely eat and drink as deep into their radiation/chemotherapy as possible to provide the best possible long-term swallowing outcome for pt.  SLP then developed an individualized HEP for pt involving oral and pharyngeal and vocal  strengthening and ROM and pt was instructed how to perform these exercises, including SLP demonstration. After SLP demonstration, pt return demonstrated each exercise. SLP ensured pt performance was correct prior to educating pt on next exercise. Pt required occasional min-mod cues faded to modified independent to perform HEP. Pt was instructed to complete this program 6-7 days/week, at least 20 reps per day until 6 months after his or her last day of rad tx, and then x2 a week after that, indefinitely. Among other modifications for days when pt cannot functionally swallow, SLP also suggested pt to perform only non-swallowing tasks on the handout/HEP, and if necessary to cycle through the swallowing portion so the full program of exercises can be completed instead of fatiguing on one of the swallowing exercises and being unable to perform the other  swallowing exercises. SLP instructed that swallowing exercises should then be added back into the regimen as pt is able to do so. Secondly, pt was told that former patients have told SLP that during their course of radiation therapy, taking prescribed pain medication just prior to performing HEP (and eating/drinking) has proven helpful in completing HEP (and eating and drinking) more regularly when going through their course of radiation treatment.    PATIENT EDUCATION: Education details: late effects head/neck radiation on swallow function and HEP procedure Person educated: Patient Education method: Explanation, Demonstration, Verbal cues, and Handouts Education comprehension: verbalized understanding, returned demonstration, verbal cues required, and needs further education   ASSESSMENT:  CLINICAL IMPRESSION: Patient is a 73 y.o. M who was seen today for assessment of swallowing as they undergo radiation/chemoradiation therapy. Today pt ate  turkey sandwich and drank thin liquids without overt s/s oral or pharyngeal difficulty. At this time pt swallowing is deemed WNL/WFL with these POs. No oral or overt s/sx pharyngeal deficits, including aspiration were observed. There are no overt s/s aspiration PNA observed by SLP nor any reported by pt at this time. Data indicate that pt's swallow ability will likely decrease over the course of radiation/chemoradiation therapy and could very well decline over time following the conclusion of that therapy due to muscle disuse atrophy and/or muscle fibrosis. Pt will cont to need to be seen by SLP in order to assess safety of PO intake, assess the need for recommending any objective swallow assessment, and ensuring pt is correctly completing the individualized HEP.  OBJECTIVE IMPAIRMENTS: include voice disorder and dysphagia. These impairments are limiting patient from household responsibilities, ADLs/IADLs, effectively communicating at home and in community, and  safety when swallowing. Factors affecting potential to achieve goals and functional outcome are  none noted today . Patient will benefit from skilled SLP services to address above impairments and improve overall function.  REHAB POTENTIAL: Good   GOALS: Goals reviewed with patient? Yes SHORT TERM GOALS: Target: 3rd total session   1. Pt will compelte HEP with modified independence in 2 sessions Baseline: Goal status: INITIAL   2.  pt will tell SLP why pt is completing HEP with modified independence Baseline:  Goal status: INITIAL   3.  pt will describe 3 overt s/s aspiration PNA with modified independence Baseline:  Goal status: INITIAL   4.  pt will tell SLP how a food journal could hasten return to a more  normalized diet Baseline:  Goal status: INITIAL     LONG TERM GOALS: Target: 7th total session   1.  pt will complete HEP with independence over two visits Baseline:  Goal status: INITIAL   2.  pt will describe how to modify HEP over time, and the timeline associated with reduction in HEP frequency with modified independence over two sessions Baseline:  Goal status: INITIAL   PLAN:   SLP FREQUENCY:  once approx every 4 weeks   SLP DURATION:  7 sessions   PLANNED INTERVENTIONS: Aspiration precaution training, Pharyngeal strengthening exercises, Diet toleration management , Trials of upgraded texture/liquids, SLP instruction and feedback, Compensatory strategies, Patient/family education, and vocal hygiene (if necessary).   Sole Lengacher, CCC-SLP 03/07/2023, 12:34 PM

## 2023-03-08 ENCOUNTER — Ambulatory Visit
Admission: RE | Admit: 2023-03-08 | Discharge: 2023-03-08 | Disposition: A | Discharge status: Home / Self Care | Payer: Medicare Other | Source: Ambulatory Visit | Attending: Radiation Oncology

## 2023-03-08 ENCOUNTER — Other Ambulatory Visit: Payer: Self-pay

## 2023-03-08 DIAGNOSIS — Z510 Encounter for antineoplastic radiation therapy: Secondary | ICD-10-CM | POA: Diagnosis not present

## 2023-03-08 DIAGNOSIS — E041 Nontoxic single thyroid nodule: Secondary | ICD-10-CM | POA: Diagnosis not present

## 2023-03-08 DIAGNOSIS — C320 Malignant neoplasm of glottis: Secondary | ICD-10-CM | POA: Diagnosis not present

## 2023-03-08 LAB — RAD ONC ARIA SESSION SUMMARY
Course Elapsed Days: 11
Plan Fractions Treated to Date: 8
Plan Prescribed Dose Per Fraction: 2.25 Gy
Plan Total Fractions Prescribed: 29
Plan Total Prescribed Dose: 65.25 Gy
Reference Point Dosage Given to Date: 18 Gy
Reference Point Session Dosage Given: 2.25 Gy
Session Number: 8

## 2023-03-11 ENCOUNTER — Ambulatory Visit
Admission: RE | Admit: 2023-03-11 | Discharge: 2023-03-11 | Disposition: A | Discharge status: Home / Self Care | Payer: Medicare Other | Source: Ambulatory Visit | Attending: Radiation Oncology | Admitting: Radiation Oncology

## 2023-03-11 ENCOUNTER — Other Ambulatory Visit: Payer: Self-pay

## 2023-03-11 ENCOUNTER — Other Ambulatory Visit: Payer: Self-pay | Admitting: Radiation Oncology

## 2023-03-11 DIAGNOSIS — Z510 Encounter for antineoplastic radiation therapy: Secondary | ICD-10-CM | POA: Diagnosis not present

## 2023-03-11 DIAGNOSIS — C320 Malignant neoplasm of glottis: Secondary | ICD-10-CM | POA: Diagnosis not present

## 2023-03-11 DIAGNOSIS — E041 Nontoxic single thyroid nodule: Secondary | ICD-10-CM | POA: Diagnosis not present

## 2023-03-11 LAB — RAD ONC ARIA SESSION SUMMARY
Course Elapsed Days: 14
Plan Fractions Treated to Date: 9
Plan Prescribed Dose Per Fraction: 2.25 Gy
Plan Total Fractions Prescribed: 29
Plan Total Prescribed Dose: 65.25 Gy
Reference Point Dosage Given to Date: 20.25 Gy
Reference Point Session Dosage Given: 2.25 Gy
Session Number: 9

## 2023-03-12 ENCOUNTER — Inpatient Hospital Stay: Payer: Medicare Other | Attending: Radiation Oncology | Admitting: Dietician

## 2023-03-12 ENCOUNTER — Other Ambulatory Visit: Payer: Self-pay | Admitting: Radiation Oncology

## 2023-03-12 ENCOUNTER — Telehealth: Payer: Self-pay | Admitting: *Deleted

## 2023-03-12 ENCOUNTER — Other Ambulatory Visit: Payer: Self-pay

## 2023-03-12 ENCOUNTER — Ambulatory Visit
Admission: RE | Admit: 2023-03-12 | Discharge: 2023-03-12 | Disposition: A | Discharge status: Home / Self Care | Payer: Medicare Other | Source: Ambulatory Visit | Attending: Radiation Oncology | Admitting: Radiation Oncology

## 2023-03-12 DIAGNOSIS — C320 Malignant neoplasm of glottis: Secondary | ICD-10-CM

## 2023-03-12 DIAGNOSIS — E041 Nontoxic single thyroid nodule: Secondary | ICD-10-CM | POA: Diagnosis not present

## 2023-03-12 DIAGNOSIS — Z510 Encounter for antineoplastic radiation therapy: Secondary | ICD-10-CM | POA: Diagnosis not present

## 2023-03-12 LAB — RAD ONC ARIA SESSION SUMMARY
Course Elapsed Days: 15
Plan Fractions Treated to Date: 10
Plan Prescribed Dose Per Fraction: 2.25 Gy
Plan Total Fractions Prescribed: 29
Plan Total Prescribed Dose: 65.25 Gy
Reference Point Dosage Given to Date: 22.5 Gy
Reference Point Session Dosage Given: 2.25 Gy
Session Number: 10

## 2023-03-12 MED ORDER — LIDOCAINE VISCOUS HCL 2 % MT SOLN: OROMUCOSAL | 3 refills | Status: DC

## 2023-03-12 NOTE — Progress Notes (Signed)
 Nutrition Follow-up:  Patient with glottis cancer. He is receiving radiation therapy. S/p 10/29 planned fractions under the care of Dr. Izell  Met with patient and wife in office following radiation. Patient reports good appetite and eating as normal. Patient surprised that he lost weight with no dietary changes. He denies altered taste, thick saliva/dry mouth. His throat is a little scratchy today. Patient has increased intake of water . Per wife, drinking 4 bottles and has one soda at dinner. Patient tried samples of Ensure and liked these, but has not purchased any.    Medications: reviewed   Labs: no new labs  Anthropometrics: Last wt 124.4 lb (aria) on 1/6 decreased 137.4 lb on 12/23 - severe  9% wt loss in 2 weeks    NUTRITION DIAGNOSIS: Food and nutrition related knowledge deficit continues     INTERVENTION:  Educated on increased energy expenditure while undergoing treatment and importance of adequate calorie and protein energy intake to maintain lean body mass/minimize wt loss Educated on strategies for adding calories and protein to foods Recommend 2 Ensure Plus/equivalent - samples + coupons provided     MONITORING, EVALUATION, GOAL: wt trends, intake   NEXT VISIT: Tuesday January 14 after radiation with [facility]

## 2023-03-12 NOTE — Telephone Encounter (Signed)
 Spoke with the patient to let him know we have sent a prescription for Lidocaine solution to his pharmacy.  He verbalized understanding.  Lind Covert RN, BSN

## 2023-03-13 ENCOUNTER — Ambulatory Visit
Admission: RE | Admit: 2023-03-13 | Discharge: 2023-03-13 | Disposition: A | Discharge status: Home / Self Care | Payer: Medicare Other | Source: Ambulatory Visit | Attending: Radiation Oncology | Admitting: Radiation Oncology

## 2023-03-13 ENCOUNTER — Other Ambulatory Visit: Payer: Self-pay

## 2023-03-13 DIAGNOSIS — Z510 Encounter for antineoplastic radiation therapy: Secondary | ICD-10-CM | POA: Diagnosis not present

## 2023-03-13 DIAGNOSIS — E041 Nontoxic single thyroid nodule: Secondary | ICD-10-CM | POA: Diagnosis not present

## 2023-03-13 DIAGNOSIS — C320 Malignant neoplasm of glottis: Secondary | ICD-10-CM | POA: Diagnosis not present

## 2023-03-13 LAB — RAD ONC ARIA SESSION SUMMARY
Course Elapsed Days: 16
Plan Fractions Treated to Date: 11
Plan Prescribed Dose Per Fraction: 2.25 Gy
Plan Total Fractions Prescribed: 29
Plan Total Prescribed Dose: 65.25 Gy
Reference Point Dosage Given to Date: 24.75 Gy
Reference Point Session Dosage Given: 2.25 Gy
Session Number: 11

## 2023-03-14 ENCOUNTER — Other Ambulatory Visit: Payer: Self-pay

## 2023-03-14 ENCOUNTER — Ambulatory Visit
Admission: RE | Admit: 2023-03-14 | Discharge: 2023-03-14 | Disposition: A | Discharge status: Home / Self Care | Payer: Medicare Other | Source: Ambulatory Visit | Attending: Radiation Oncology | Admitting: Radiation Oncology

## 2023-03-14 DIAGNOSIS — C320 Malignant neoplasm of glottis: Secondary | ICD-10-CM | POA: Diagnosis not present

## 2023-03-14 DIAGNOSIS — E041 Nontoxic single thyroid nodule: Secondary | ICD-10-CM | POA: Diagnosis not present

## 2023-03-14 DIAGNOSIS — Z510 Encounter for antineoplastic radiation therapy: Secondary | ICD-10-CM | POA: Diagnosis not present

## 2023-03-14 LAB — RAD ONC ARIA SESSION SUMMARY
Course Elapsed Days: 17
Plan Fractions Treated to Date: 12
Plan Prescribed Dose Per Fraction: 2.25 Gy
Plan Total Fractions Prescribed: 29
Plan Total Prescribed Dose: 65.25 Gy
Reference Point Dosage Given to Date: 27 Gy
Reference Point Session Dosage Given: 2.25 Gy
Session Number: 12

## 2023-03-15 ENCOUNTER — Other Ambulatory Visit: Payer: Self-pay

## 2023-03-15 ENCOUNTER — Ambulatory Visit
Admission: RE | Admit: 2023-03-15 | Discharge: 2023-03-15 | Disposition: A | Discharge status: Home / Self Care | Payer: Medicare Other | Source: Ambulatory Visit | Attending: Radiation Oncology | Admitting: Radiation Oncology

## 2023-03-15 DIAGNOSIS — Z510 Encounter for antineoplastic radiation therapy: Secondary | ICD-10-CM | POA: Diagnosis not present

## 2023-03-15 DIAGNOSIS — E041 Nontoxic single thyroid nodule: Secondary | ICD-10-CM | POA: Diagnosis not present

## 2023-03-15 DIAGNOSIS — C320 Malignant neoplasm of glottis: Secondary | ICD-10-CM | POA: Diagnosis not present

## 2023-03-15 LAB — RAD ONC ARIA SESSION SUMMARY
Course Elapsed Days: 18
Plan Fractions Treated to Date: 13
Plan Prescribed Dose Per Fraction: 2.25 Gy
Plan Total Fractions Prescribed: 29
Plan Total Prescribed Dose: 65.25 Gy
Reference Point Dosage Given to Date: 29.25 Gy
Reference Point Session Dosage Given: 2.25 Gy
Session Number: 13

## 2023-03-18 ENCOUNTER — Ambulatory Visit
Admission: RE | Admit: 2023-03-18 | Discharge: 2023-03-18 | Disposition: A | Discharge status: Home / Self Care | Payer: Medicare Other | Source: Ambulatory Visit | Attending: Radiation Oncology

## 2023-03-18 ENCOUNTER — Ambulatory Visit: Payer: Medicare Other

## 2023-03-18 ENCOUNTER — Other Ambulatory Visit: Payer: Self-pay

## 2023-03-18 DIAGNOSIS — Z510 Encounter for antineoplastic radiation therapy: Secondary | ICD-10-CM | POA: Diagnosis not present

## 2023-03-18 DIAGNOSIS — C320 Malignant neoplasm of glottis: Secondary | ICD-10-CM | POA: Diagnosis not present

## 2023-03-18 DIAGNOSIS — E041 Nontoxic single thyroid nodule: Secondary | ICD-10-CM | POA: Diagnosis not present

## 2023-03-18 LAB — RAD ONC ARIA SESSION SUMMARY
Course Elapsed Days: 21
Plan Fractions Treated to Date: 14
Plan Prescribed Dose Per Fraction: 2.25 Gy
Plan Total Fractions Prescribed: 29
Plan Total Prescribed Dose: 65.25 Gy
Reference Point Dosage Given to Date: 31.5 Gy
Reference Point Session Dosage Given: 2.25 Gy
Session Number: 14

## 2023-03-19 ENCOUNTER — Ambulatory Visit
Admission: RE | Admit: 2023-03-19 | Discharge: 2023-03-19 | Disposition: A | Discharge status: Home / Self Care | Payer: Medicare Other | Source: Ambulatory Visit | Attending: Radiation Oncology | Admitting: Radiation Oncology

## 2023-03-19 ENCOUNTER — Other Ambulatory Visit: Payer: Self-pay

## 2023-03-19 ENCOUNTER — Inpatient Hospital Stay: Payer: Medicare Other | Admitting: Nutrition

## 2023-03-19 DIAGNOSIS — C320 Malignant neoplasm of glottis: Secondary | ICD-10-CM | POA: Diagnosis not present

## 2023-03-19 DIAGNOSIS — Z510 Encounter for antineoplastic radiation therapy: Secondary | ICD-10-CM | POA: Diagnosis not present

## 2023-03-19 DIAGNOSIS — E041 Nontoxic single thyroid nodule: Secondary | ICD-10-CM | POA: Diagnosis not present

## 2023-03-19 LAB — RAD ONC ARIA SESSION SUMMARY
Course Elapsed Days: 22
Plan Fractions Treated to Date: 15
Plan Prescribed Dose Per Fraction: 2.25 Gy
Plan Total Fractions Prescribed: 29
Plan Total Prescribed Dose: 65.25 Gy
Reference Point Dosage Given to Date: 33.75 Gy
Reference Point Session Dosage Given: 2.25 Gy
Session Number: 15

## 2023-03-19 NOTE — Progress Notes (Signed)
 Nutrition follow-up completed with patient and wife after radiation therapy.  Patient is receiving 29 fractions for glottis cancer and is followed by Dr. Izell.  Final radiation therapy is scheduled for February 3.  Weight improved and documented as 134.4 pounds January 13.  This is increased from 124.4 pounds on January 6.  Patient reports he has painful swallowing however lidocaine  has helped.  He describes thickened saliva.  Reports he has not been able to drink as much water  because it is harder to swallow.  He has consumed 3 cartons of Ensure plus high-protein daily since last RD visit.  His wife is making soups and blenderized food for him.  He finds room temperature liquids easier to swallow.  Nutrition diagnosis: Food and nutrition related knowledge deficit, ongoing.  Intervention: Educated to continue Ensure plus or equivalent 3 times daily with meals or between meals as tolerated. Continue lidocaine  to ease swallowing. Continue to alternate temperatures of foods and liquids to improve intake. Provided additional samples.  Provided recipes on making his own shake. Educated on strategies for improving dry mouth.  Nutrition facts sheet provided.  Monitoring, evaluation, goals: Patient will tolerate increased calories and protein to minimize weight loss.  Next visit: Monday, January 20 after radiation therapy.  **Disclaimer: This note was dictated with voice recognition software. Similar sounding words can inadvertently be transcribed and this note may contain transcription errors which may not have been corrected upon publication of note.**

## 2023-03-20 ENCOUNTER — Ambulatory Visit
Admission: RE | Admit: 2023-03-20 | Discharge: 2023-03-20 | Disposition: A | Discharge status: Home / Self Care | Payer: Medicare Other | Source: Ambulatory Visit | Attending: Radiation Oncology | Admitting: Radiation Oncology

## 2023-03-20 ENCOUNTER — Other Ambulatory Visit: Payer: Self-pay

## 2023-03-20 DIAGNOSIS — E041 Nontoxic single thyroid nodule: Secondary | ICD-10-CM | POA: Diagnosis not present

## 2023-03-20 DIAGNOSIS — C320 Malignant neoplasm of glottis: Secondary | ICD-10-CM | POA: Diagnosis not present

## 2023-03-20 DIAGNOSIS — Z510 Encounter for antineoplastic radiation therapy: Secondary | ICD-10-CM | POA: Diagnosis not present

## 2023-03-20 LAB — RAD ONC ARIA SESSION SUMMARY
Course Elapsed Days: 23
Plan Fractions Treated to Date: 16
Plan Prescribed Dose Per Fraction: 2.25 Gy
Plan Total Fractions Prescribed: 29
Plan Total Prescribed Dose: 65.25 Gy
Reference Point Dosage Given to Date: 36 Gy
Reference Point Session Dosage Given: 2.25 Gy
Session Number: 16

## 2023-03-21 ENCOUNTER — Ambulatory Visit
Admission: RE | Admit: 2023-03-21 | Discharge: 2023-03-21 | Disposition: A | Discharge status: Home / Self Care | Payer: Medicare Other | Source: Ambulatory Visit | Attending: Radiation Oncology | Admitting: Radiation Oncology

## 2023-03-21 ENCOUNTER — Other Ambulatory Visit: Payer: Self-pay

## 2023-03-21 DIAGNOSIS — Z510 Encounter for antineoplastic radiation therapy: Secondary | ICD-10-CM | POA: Diagnosis not present

## 2023-03-21 DIAGNOSIS — C320 Malignant neoplasm of glottis: Secondary | ICD-10-CM | POA: Diagnosis not present

## 2023-03-21 DIAGNOSIS — E041 Nontoxic single thyroid nodule: Secondary | ICD-10-CM | POA: Diagnosis not present

## 2023-03-21 LAB — RAD ONC ARIA SESSION SUMMARY
Course Elapsed Days: 24
Plan Fractions Treated to Date: 17
Plan Prescribed Dose Per Fraction: 2.25 Gy
Plan Total Fractions Prescribed: 29
Plan Total Prescribed Dose: 65.25 Gy
Reference Point Dosage Given to Date: 38.25 Gy
Reference Point Session Dosage Given: 2.25 Gy
Session Number: 17

## 2023-03-22 ENCOUNTER — Ambulatory Visit
Admission: RE | Admit: 2023-03-22 | Discharge: 2023-03-22 | Disposition: A | Discharge status: Home / Self Care | Payer: Medicare Other | Source: Ambulatory Visit | Attending: Radiation Oncology | Admitting: Radiation Oncology

## 2023-03-22 ENCOUNTER — Other Ambulatory Visit: Payer: Self-pay

## 2023-03-22 DIAGNOSIS — C320 Malignant neoplasm of glottis: Secondary | ICD-10-CM | POA: Diagnosis not present

## 2023-03-22 DIAGNOSIS — E041 Nontoxic single thyroid nodule: Secondary | ICD-10-CM | POA: Diagnosis not present

## 2023-03-22 DIAGNOSIS — Z510 Encounter for antineoplastic radiation therapy: Secondary | ICD-10-CM | POA: Diagnosis not present

## 2023-03-22 LAB — RAD ONC ARIA SESSION SUMMARY
Course Elapsed Days: 25
Plan Fractions Treated to Date: 18
Plan Prescribed Dose Per Fraction: 2.25 Gy
Plan Total Fractions Prescribed: 29
Plan Total Prescribed Dose: 65.25 Gy
Reference Point Dosage Given to Date: 40.5 Gy
Reference Point Session Dosage Given: 2.25 Gy
Session Number: 18

## 2023-03-25 ENCOUNTER — Encounter: Payer: Self-pay | Admitting: Nutrition

## 2023-03-25 ENCOUNTER — Other Ambulatory Visit: Payer: Self-pay

## 2023-03-25 ENCOUNTER — Ambulatory Visit
Admission: RE | Admit: 2023-03-25 | Discharge: 2023-03-25 | Disposition: A | Discharge status: Home / Self Care | Payer: Medicare Other | Source: Ambulatory Visit | Attending: Radiation Oncology | Admitting: Radiation Oncology

## 2023-03-25 ENCOUNTER — Inpatient Hospital Stay: Payer: Medicare Other | Admitting: Nutrition

## 2023-03-25 DIAGNOSIS — Z510 Encounter for antineoplastic radiation therapy: Secondary | ICD-10-CM | POA: Diagnosis not present

## 2023-03-25 DIAGNOSIS — E041 Nontoxic single thyroid nodule: Secondary | ICD-10-CM | POA: Diagnosis not present

## 2023-03-25 DIAGNOSIS — C320 Malignant neoplasm of glottis: Secondary | ICD-10-CM | POA: Diagnosis not present

## 2023-03-25 LAB — RAD ONC ARIA SESSION SUMMARY
Course Elapsed Days: 28
Plan Fractions Treated to Date: 19
Plan Prescribed Dose Per Fraction: 2.25 Gy
Plan Total Fractions Prescribed: 29
Plan Total Prescribed Dose: 65.25 Gy
Reference Point Dosage Given to Date: 42.75 Gy
Reference Point Session Dosage Given: 2.25 Gy
Session Number: 19

## 2023-03-25 NOTE — Progress Notes (Signed)
Patient did not show up for nutrition appointment. 

## 2023-03-26 ENCOUNTER — Ambulatory Visit
Admission: RE | Admit: 2023-03-26 | Discharge: 2023-03-26 | Disposition: A | Discharge status: Home / Self Care | Payer: Medicare Other | Source: Ambulatory Visit | Attending: Radiation Oncology | Admitting: Radiation Oncology

## 2023-03-26 ENCOUNTER — Encounter: Payer: Medicare Other | Admitting: Nutrition

## 2023-03-26 ENCOUNTER — Ambulatory Visit: Payer: Medicare Other

## 2023-03-26 ENCOUNTER — Other Ambulatory Visit: Payer: Self-pay | Admitting: Radiation Oncology

## 2023-03-26 ENCOUNTER — Other Ambulatory Visit: Payer: Self-pay

## 2023-03-26 DIAGNOSIS — C320 Malignant neoplasm of glottis: Secondary | ICD-10-CM

## 2023-03-26 DIAGNOSIS — Z510 Encounter for antineoplastic radiation therapy: Secondary | ICD-10-CM | POA: Diagnosis not present

## 2023-03-26 DIAGNOSIS — E041 Nontoxic single thyroid nodule: Secondary | ICD-10-CM | POA: Diagnosis not present

## 2023-03-26 LAB — RAD ONC ARIA SESSION SUMMARY
Course Elapsed Days: 29
Plan Fractions Treated to Date: 20
Plan Prescribed Dose Per Fraction: 2.25 Gy
Plan Total Fractions Prescribed: 29
Plan Total Prescribed Dose: 65.25 Gy
Reference Point Dosage Given to Date: 45 Gy
Reference Point Session Dosage Given: 2.25 Gy
Session Number: 20

## 2023-03-26 MED ORDER — OXYCODONE HCL 5 MG PO TABS: 5.0000 mg | ORAL_TABLET | ORAL | 0 refills | Status: DC | PRN

## 2023-03-27 ENCOUNTER — Other Ambulatory Visit: Payer: Self-pay

## 2023-03-27 ENCOUNTER — Ambulatory Visit
Admission: RE | Admit: 2023-03-27 | Discharge: 2023-03-27 | Disposition: A | Discharge status: Home / Self Care | Payer: Medicare Other | Source: Ambulatory Visit | Attending: Radiation Oncology | Admitting: Radiation Oncology

## 2023-03-27 DIAGNOSIS — Z510 Encounter for antineoplastic radiation therapy: Secondary | ICD-10-CM | POA: Diagnosis not present

## 2023-03-27 DIAGNOSIS — E041 Nontoxic single thyroid nodule: Secondary | ICD-10-CM | POA: Diagnosis not present

## 2023-03-27 DIAGNOSIS — C320 Malignant neoplasm of glottis: Secondary | ICD-10-CM | POA: Diagnosis not present

## 2023-03-27 LAB — RAD ONC ARIA SESSION SUMMARY
Course Elapsed Days: 30
Plan Fractions Treated to Date: 21
Plan Prescribed Dose Per Fraction: 2.25 Gy
Plan Total Fractions Prescribed: 29
Plan Total Prescribed Dose: 65.25 Gy
Reference Point Dosage Given to Date: 47.25 Gy
Reference Point Session Dosage Given: 2.25 Gy
Session Number: 21

## 2023-03-28 ENCOUNTER — Ambulatory Visit
Admission: RE | Admit: 2023-03-28 | Discharge: 2023-03-28 | Disposition: A | Discharge status: Home / Self Care | Payer: Medicare Other | Source: Ambulatory Visit | Attending: Radiation Oncology | Admitting: Radiation Oncology

## 2023-03-28 ENCOUNTER — Other Ambulatory Visit: Payer: Self-pay

## 2023-03-28 DIAGNOSIS — E041 Nontoxic single thyroid nodule: Secondary | ICD-10-CM | POA: Diagnosis not present

## 2023-03-28 DIAGNOSIS — Z510 Encounter for antineoplastic radiation therapy: Secondary | ICD-10-CM | POA: Diagnosis not present

## 2023-03-28 DIAGNOSIS — C320 Malignant neoplasm of glottis: Secondary | ICD-10-CM | POA: Diagnosis not present

## 2023-03-28 LAB — RAD ONC ARIA SESSION SUMMARY
Course Elapsed Days: 31
Plan Fractions Treated to Date: 22
Plan Prescribed Dose Per Fraction: 2.25 Gy
Plan Total Fractions Prescribed: 29
Plan Total Prescribed Dose: 65.25 Gy
Reference Point Dosage Given to Date: 49.5 Gy
Reference Point Session Dosage Given: 2.25 Gy
Session Number: 22

## 2023-03-29 ENCOUNTER — Other Ambulatory Visit: Payer: Self-pay

## 2023-03-29 ENCOUNTER — Ambulatory Visit
Admission: RE | Admit: 2023-03-29 | Discharge: 2023-03-29 | Disposition: A | Discharge status: Home / Self Care | Payer: Medicare Other | Source: Ambulatory Visit | Attending: Radiation Oncology

## 2023-03-29 DIAGNOSIS — C320 Malignant neoplasm of glottis: Secondary | ICD-10-CM | POA: Diagnosis not present

## 2023-03-29 DIAGNOSIS — Z510 Encounter for antineoplastic radiation therapy: Secondary | ICD-10-CM | POA: Diagnosis not present

## 2023-03-29 DIAGNOSIS — E041 Nontoxic single thyroid nodule: Secondary | ICD-10-CM | POA: Diagnosis not present

## 2023-03-29 LAB — RAD ONC ARIA SESSION SUMMARY
Course Elapsed Days: 32
Plan Fractions Treated to Date: 23
Plan Prescribed Dose Per Fraction: 2.25 Gy
Plan Total Fractions Prescribed: 29
Plan Total Prescribed Dose: 65.25 Gy
Reference Point Dosage Given to Date: 51.75 Gy
Reference Point Session Dosage Given: 2.25 Gy
Session Number: 23

## 2023-04-01 ENCOUNTER — Ambulatory Visit: Payer: Medicare Other

## 2023-04-01 ENCOUNTER — Other Ambulatory Visit: Payer: Self-pay

## 2023-04-01 ENCOUNTER — Ambulatory Visit
Admission: RE | Admit: 2023-04-01 | Discharge: 2023-04-01 | Disposition: A | Discharge status: Home / Self Care | Payer: Medicare Other | Source: Ambulatory Visit | Attending: Radiation Oncology

## 2023-04-01 DIAGNOSIS — E041 Nontoxic single thyroid nodule: Secondary | ICD-10-CM | POA: Diagnosis not present

## 2023-04-01 DIAGNOSIS — Z510 Encounter for antineoplastic radiation therapy: Secondary | ICD-10-CM | POA: Diagnosis not present

## 2023-04-01 DIAGNOSIS — C320 Malignant neoplasm of glottis: Secondary | ICD-10-CM | POA: Diagnosis not present

## 2023-04-01 LAB — RAD ONC ARIA SESSION SUMMARY
Course Elapsed Days: 35
Plan Fractions Treated to Date: 24
Plan Prescribed Dose Per Fraction: 2.25 Gy
Plan Total Fractions Prescribed: 29
Plan Total Prescribed Dose: 65.25 Gy
Reference Point Dosage Given to Date: 54 Gy
Reference Point Session Dosage Given: 2.25 Gy
Session Number: 24

## 2023-04-02 ENCOUNTER — Ambulatory Visit
Admission: RE | Admit: 2023-04-02 | Discharge: 2023-04-02 | Disposition: A | Discharge status: Home / Self Care | Payer: Medicare Other | Source: Ambulatory Visit | Attending: Radiation Oncology

## 2023-04-02 ENCOUNTER — Ambulatory Visit: Payer: Medicare Other | Admitting: Dietician

## 2023-04-02 ENCOUNTER — Ambulatory Visit: Payer: Medicare Other

## 2023-04-02 ENCOUNTER — Other Ambulatory Visit: Payer: Self-pay

## 2023-04-02 DIAGNOSIS — Z510 Encounter for antineoplastic radiation therapy: Secondary | ICD-10-CM | POA: Diagnosis not present

## 2023-04-02 DIAGNOSIS — E041 Nontoxic single thyroid nodule: Secondary | ICD-10-CM | POA: Diagnosis not present

## 2023-04-02 DIAGNOSIS — C320 Malignant neoplasm of glottis: Secondary | ICD-10-CM | POA: Diagnosis not present

## 2023-04-02 LAB — RAD ONC ARIA SESSION SUMMARY
Course Elapsed Days: 36
Plan Fractions Treated to Date: 25
Plan Prescribed Dose Per Fraction: 2.25 Gy
Plan Total Fractions Prescribed: 29
Plan Total Prescribed Dose: 65.25 Gy
Reference Point Dosage Given to Date: 56.25 Gy
Reference Point Session Dosage Given: 2.25 Gy
Session Number: 25

## 2023-04-02 NOTE — Progress Notes (Signed)
Nutrition Follow-up:  Patient with glottis cancer. He is receiving radiation therapy. Final RT planned 2/3   Met with patient and wife in office following treatment. He reports good appetite, eating 2 meals and drinking 2 Ensure Complete. Patient denies dysphagia. He is taking small bites and taking his time. Yesterday patient had egg, grits for breakfast, cream of chicken soup for dinner. Had strawberry shake (Ensure Complete + strawberry ice cream). He denies altered taste. Saliva is thick and mouth is dry. Patient drinking 3 bottles of water.    Medications: reviewed   Labs: no new labs  Anthropometrics: Last wt 133.8 lb on 1/21 - stable  1/13 - 134.4 lb 1/6 - 124.4 lb    NUTRITION DIAGNOSIS: Food and nutrition related knowledge deficit - improving    INTERVENTION:  Encouraged high calorie high protein foods for wt maintenance  Continue 2 Ensure Complete/equivalent - samples + coupons  Suggested sprite/ginger ale rinses for thick saliva     MONITORING, EVALUATION, GOAL: wt trends, intake   NEXT VISIT: To be scheduled

## 2023-04-03 ENCOUNTER — Ambulatory Visit
Admission: RE | Admit: 2023-04-03 | Discharge: 2023-04-03 | Disposition: A | Discharge status: Home / Self Care | Payer: Medicare Other | Source: Ambulatory Visit | Attending: Radiation Oncology | Admitting: Radiation Oncology

## 2023-04-03 ENCOUNTER — Other Ambulatory Visit: Payer: Self-pay

## 2023-04-03 DIAGNOSIS — C320 Malignant neoplasm of glottis: Secondary | ICD-10-CM | POA: Diagnosis not present

## 2023-04-03 DIAGNOSIS — Z510 Encounter for antineoplastic radiation therapy: Secondary | ICD-10-CM | POA: Diagnosis not present

## 2023-04-03 DIAGNOSIS — E041 Nontoxic single thyroid nodule: Secondary | ICD-10-CM | POA: Diagnosis not present

## 2023-04-03 LAB — RAD ONC ARIA SESSION SUMMARY
Course Elapsed Days: 37
Plan Fractions Treated to Date: 26
Plan Prescribed Dose Per Fraction: 2.25 Gy
Plan Total Fractions Prescribed: 29
Plan Total Prescribed Dose: 65.25 Gy
Reference Point Dosage Given to Date: 58.5 Gy
Reference Point Session Dosage Given: 2.25 Gy
Session Number: 26

## 2023-04-04 ENCOUNTER — Ambulatory Visit
Admission: RE | Admit: 2023-04-04 | Discharge: 2023-04-04 | Disposition: A | Discharge status: Home / Self Care | Payer: Medicare Other | Source: Ambulatory Visit | Attending: Radiation Oncology | Admitting: Radiation Oncology

## 2023-04-04 ENCOUNTER — Other Ambulatory Visit: Payer: Self-pay

## 2023-04-04 DIAGNOSIS — Z510 Encounter for antineoplastic radiation therapy: Secondary | ICD-10-CM | POA: Diagnosis not present

## 2023-04-04 DIAGNOSIS — E041 Nontoxic single thyroid nodule: Secondary | ICD-10-CM | POA: Diagnosis not present

## 2023-04-04 DIAGNOSIS — C320 Malignant neoplasm of glottis: Secondary | ICD-10-CM | POA: Diagnosis not present

## 2023-04-04 LAB — RAD ONC ARIA SESSION SUMMARY
Course Elapsed Days: 38
Plan Fractions Treated to Date: 27
Plan Prescribed Dose Per Fraction: 2.25 Gy
Plan Total Fractions Prescribed: 29
Plan Total Prescribed Dose: 65.25 Gy
Reference Point Dosage Given to Date: 60.75 Gy
Reference Point Session Dosage Given: 2.25 Gy
Session Number: 27

## 2023-04-05 ENCOUNTER — Ambulatory Visit
Admission: RE | Admit: 2023-04-05 | Discharge: 2023-04-05 | Disposition: A | Discharge status: Home / Self Care | Payer: Medicare Other | Source: Ambulatory Visit | Attending: Radiation Oncology | Admitting: Radiation Oncology

## 2023-04-05 ENCOUNTER — Other Ambulatory Visit: Payer: Self-pay

## 2023-04-05 DIAGNOSIS — C320 Malignant neoplasm of glottis: Secondary | ICD-10-CM | POA: Diagnosis not present

## 2023-04-05 DIAGNOSIS — Z510 Encounter for antineoplastic radiation therapy: Secondary | ICD-10-CM | POA: Diagnosis not present

## 2023-04-05 DIAGNOSIS — E041 Nontoxic single thyroid nodule: Secondary | ICD-10-CM | POA: Diagnosis not present

## 2023-04-05 LAB — RAD ONC ARIA SESSION SUMMARY
Course Elapsed Days: 39
Plan Fractions Treated to Date: 28
Plan Prescribed Dose Per Fraction: 2.25 Gy
Plan Total Fractions Prescribed: 29
Plan Total Prescribed Dose: 65.25 Gy
Reference Point Dosage Given to Date: 63 Gy
Reference Point Session Dosage Given: 2.25 Gy
Session Number: 28

## 2023-04-08 ENCOUNTER — Ambulatory Visit
Admission: RE | Admit: 2023-04-08 | Discharge: 2023-04-08 | Disposition: A | Discharge status: Home / Self Care | Payer: Medicare Other | Source: Ambulatory Visit | Attending: Radiation Oncology | Admitting: Radiation Oncology

## 2023-04-08 ENCOUNTER — Other Ambulatory Visit: Payer: Self-pay

## 2023-04-08 ENCOUNTER — Ambulatory Visit: Payer: Medicare Other

## 2023-04-08 ENCOUNTER — Other Ambulatory Visit: Payer: Self-pay | Admitting: Radiation Oncology

## 2023-04-08 DIAGNOSIS — Z510 Encounter for antineoplastic radiation therapy: Secondary | ICD-10-CM | POA: Diagnosis not present

## 2023-04-08 DIAGNOSIS — C320 Malignant neoplasm of glottis: Secondary | ICD-10-CM

## 2023-04-08 DIAGNOSIS — E041 Nontoxic single thyroid nodule: Secondary | ICD-10-CM | POA: Insufficient documentation

## 2023-04-08 LAB — RAD ONC ARIA SESSION SUMMARY
Course Elapsed Days: 42
Plan Fractions Treated to Date: 29
Plan Prescribed Dose Per Fraction: 2.25 Gy
Plan Total Fractions Prescribed: 29
Plan Total Prescribed Dose: 65.25 Gy
Reference Point Dosage Given to Date: 65.25 Gy
Reference Point Session Dosage Given: 2.25 Gy
Session Number: 29

## 2023-04-08 MED ORDER — OXYCODONE HCL 5 MG PO TABS: 5.0000 mg | ORAL_TABLET | ORAL | 0 refills | Status: DC | PRN

## 2023-04-08 NOTE — Progress Notes (Signed)
Oncology Nurse Navigator Documentation   Met with William Perkins and his wife after final RT to offer support and to celebrate end of radiation treatment.   Provided verbal post-RT guidance: Importance of keeping all follow-up appts, especially those with Nutrition and SLP. Importance of protecting treatment area from sun. Continuation of Sonafine application 2-3 times daily, application of antibiotic ointment to areas of raw skin; when supply of Sonafine exhausted transition to OTC lotion with vitamin E.  Explained my role as navigator will continue for several more months, encouraged him to call me with needs/concerns.    Hedda Slade RN, BSN, OCN Head & Neck Oncology Nurse Navigator [facility] Phone # [phone]  Fax # [phone]

## 2023-04-09 NOTE — Radiation Completion Notes (Signed)
Patient Name: William Perkins, UHER MRN: [id number] Date of Birth: 07/15/1950 Referring Physician: Jovita Kussmaul, M.D. Date of Service: 2023-04-09 Radiation Oncologist: Lonie Peak, M.D. [facility] [place]                             RADIATION ONCOLOGY END OF TREATMENT NOTE     Diagnosis: C32.0 Malignant neoplasm of glottis Staging on 2023-02-06: Glottis carcinoma (HCC) T=cT2, N=cN0, M=cM0 Intent: Curative     ==========DELIVERED PLANS==========  First Treatment Date: 2023-02-25 Last Treatment Date: 2023-04-08   Plan Name: HN_Larynx Site: Larynx Technique: 3D Mode: Photon Dose Per Fraction: 2.25 Gy Prescribed Dose (Delivered / Prescribed): 65.25 Gy / 65.25 Gy Prescribed Fxs (Delivered / Prescribed): 29 / 29     ==========ON TREATMENT VISIT DATES========== 2023-02-25, 2023-03-11, 2023-03-18, 2023-03-26, 2023-04-02, 2023-04-08     ==========UPCOMING VISITS==========       ==========APPENDIX - ON TREATMENT VISIT NOTES==========   See weekly On Treatment Notes in Epic for details in the Media tab (listed as Progress notes on the On Treatment Visit Dates listed above).

## 2023-04-16 ENCOUNTER — Ambulatory Visit: Payer: Medicare Other | Attending: Radiation Oncology

## 2023-04-16 DIAGNOSIS — R1310 Dysphagia, unspecified: Secondary | ICD-10-CM | POA: Diagnosis not present

## 2023-04-16 DIAGNOSIS — R490 Dysphonia: Secondary | ICD-10-CM | POA: Insufficient documentation

## 2023-04-16 NOTE — Therapy (Signed)
OUTPATIENT SPEECH LANGUAGE PATHOLOGY ONCOLOGY TREATMENT   Patient Name: BARTLETT ENKE MRN: [id number] DOB:[birth year], 73 y.o., male Today's Date: 04/16/2023  PCP: [name] MD REFERRING PROVIDER: Lonie Peak, MD  END OF SESSION:  End of Session - 04/16/23 1400     Visit Number 2    Number of Visits 7    Date for SLP Re-Evaluation 06/05/23    SLP Start Time 1320    SLP Stop Time  1355    SLP Time Calculation (min) 35 min    Activity Tolerance Patient tolerated treatment well              Past Medical History:  Diagnosis Date   Chronic renal insufficiency 03/21/2006   CrCl <50 ml   DDD (degenerative disc disease), cervical    GERD 03/22/2006   Qualifier: Diagnosis of  By: Danae Chen     GERD (gastroesophageal reflux disease) 03/22/2006   Hand numbness 10/07/2009   pt states not any longer   Hyperlipidemia 03/22/2008   Hypertension    Hypertension 03/22/2006   Left ventricular hypertrophy 03/22/2006   Leg pain, left 11/15/2009   Lumbar strain 07/15/2008   Renal insufficiency    Soft tissue disorder 08/2008   Tobacco abuse 03/22/2006   2 ppd x 30 years   Past Surgical History:  Procedure Laterality Date   ABDOMINAL AORTOGRAM W/LOWER EXTREMITY N/A 05/18/2020   Procedure: ABDOMINAL AORTOGRAM W/LOWER EXTREMITY;  Surgeon: Leonie Douglas, MD;  Location: [facility];  Service: Cardiovascular;  Laterality: N/A;   BACK SURGERY  2020   PLIF   COLONOSCOPY     DIRECT LARYNGOSCOPY N/A 01/24/2023   Procedure: DIRECT LARYNGOSCOPY;  Surgeon: Read Drivers, MD;  Location: 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   Vocal cord mass 01/25/2023   Hoarseness, persistent 11/27/2022   PAD (peripheral artery disease) (HCC) 11/16/2019   History of colonic polyps 01/28/2018   Anemia 12/12/2017   Degenerative spondylolisthesis 12/12/2017   Vitamin D deficiency 08/03/2016   Cervical disc disease 07/03/2016   Type 2 diabetes mellitus with stage 3 chronic kidney disease, without long-term current use of insulin (HCC) 02/10/2015   Other emphysema (HCC) 05/22/2012   Constipation 05/22/2012   Healthcare maintenance 03/28/2012   Hyperlipidemia 03/22/2006   Essential hypertension 03/22/2006   CKD (chronic kidney disease), stage III (HCC) 03/21/2006    ONSET DATE: August-September 2024   REFERRING DIAG: Glottis Carcinoma  THERAPY DIAG:  Dysphagia, unspecified type  Hoarseness  Rationale for Evaluation and Treatment: Rehabilitation  SUBJECTIVE:   SUBJECTIVE STATEMENT: Pt ate cheese and eggs and applesauce this morning without pain Pt accompanied by: self  PERTINENT HISTORY:  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 He presented to is PCP in August -September 2024 with new onset vocal hoarseness/dysphonia. 01/17/23 He was seen by Dr. Allena Katz at [facility] for persistent symptoms. Laryngoscopy performed revealed a right vocal fold exophytic mass which appeared to involve the commissure and extend posteriorly. Some mucosal changes involving the right petiole were demonstrated as well. 01/24/23 He underwent a direct laryngoscopy  for biopsies of the right vocal fold mass and right supraglottis under the care of Dr. Allena Katz. Both of these biopsies showed findings consistent with moderately differentiated invasive squamous cell carcinoma.  Per op note "Right vocal fold exophytic lesion spanning almost entire vocal fold and involving anterior commissure. The right false vocal fold and petiole area was also suspicious and also sent for pathology  as separate specimen. Even if it returns as negative, the area is concerning for carcinoma". 02/07/23 CT neck did not show any metastatic adenopathy. Some incidental findings were noted in his thyroid gland for which [place] was recommended. 02/11/23 Consult with Dr. Basilio Cairo. He will receive radiation only. Treatment plan: He will receive 29 fractions of radiation to his larynx only. He started on 12/23 and will complete 04/08/23.  PAIN:  Are you having pain? No  FALLS: Has patient fallen in last 6 months?  No  PATIENT GOALS: maintain WNL swallowing throughout life  OBJECTIVE:  Note: Objective measures were completed at Evaluation unless otherwise noted.                                                                                                                           TREATMENT DATE:  04/16/23: Pt is picking up protein shakes tonight. Today he had applesauce and water without any overt s/sx of oral or pharyngeal difficulty. Pt states he occasionally has coughing when he lies down to watch TV due to thickened saliva. Pt asked about using his dentures and SLP told him he may be able to but may find the fit not as good if he has lost weight. Pt to attempt. Today he told SLP rationale for HEP with independence. HEP completion has been suboptimal; Pt reports a couple of times/week. SLP reiterated at least x5/week. SLP printed pt another copy of HEP. He req'd consistent mod A with procedure, faded to mod I. SLP educated pt on food journal and pt told him benefits of this. Pt and SLP discussed possibility of decr'd frequency to once every 8 weeks, after next session.  03/07/23 (eval): Research states the risk for dysphagia increases due to radiation and/or chemotherapy treatment due to a variety of factors, so SLP educated the pt about the possibility of reduced/limited ability for PO intake during rad tx. SLP also educated pt regarding possible changes to swallowing musculature after rad tx, and why adherence to  dysphagia HEP provided today and PO consumption was necessary to inhibit muscle fibrosis following rad tx. SLP informed pt why this would be detrimental to their swallowing status, voice, and to their pulmonary health. Pt demonstrated understanding of these things to SLP. SLP encouraged pt to safely eat and drink as deep into their radiation/chemotherapy as possible to provide the best possible long-term swallowing outcome for pt.  SLP then developed an individualized HEP for pt involving oral and pharyngeal and vocal  strengthening and ROM and pt was instructed how to perform these exercises, including SLP demonstration. After SLP  demonstration, pt return demonstrated each exercise. SLP ensured pt performance was correct prior to educating pt on next exercise. Pt required occasional min-mod cues faded to modified independent to perform HEP. Pt was instructed to complete this program 6-7 days/week, at least 20 reps per day until 6 months after his or her last day of rad tx, and then x2 a week after that, indefinitely. Among other modifications for days when pt cannot functionally swallow, SLP also suggested pt to perform only non-swallowing tasks on the handout/HEP, and if necessary to cycle through the swallowing portion so the full program of exercises can be completed instead of fatiguing on one of the swallowing exercises and being unable to perform the other swallowing exercises. SLP instructed that swallowing exercises should then be added back into the regimen as pt is able to do so. Secondly, pt was told that former patients have told SLP that during their course of radiation therapy, taking prescribed pain medication just prior to performing HEP (and eating/drinking) has proven helpful in completing HEP (and eating and drinking) more regularly when going through their course of radiation treatment.    PATIENT EDUCATION: Education details: HEP procedure, food journal Person educated: Patient Education  method: Explanation, Demonstration, Verbal cues, and Handouts Education comprehension: verbalized understanding, returned demonstration, verbal cues required, and needs further education   ASSESSMENT:  CLINICAL IMPRESSION: Patient is a 73 y.o. M who was seen today for treatment of swallowing as they undergo radiation/chemoradiation therapy. Today pt ate  applesauce and drank thin liquids without overt s/s oral or pharyngeal difficulty. At this time pt swallowing is deemed WNL/WFL with these POs. There are no overt s/s aspiration PNA observed by SLP nor any reported by pt at this time. Data indicate that pt's swallow ability will likely decrease over the course of radiation/chemoradiation therapy and could very well decline over time following the conclusion of that therapy due to muscle disuse atrophy and/or muscle fibrosis. Pt will cont to need to be seen by SLP in order to assess safety of PO intake, assess the need for recommending any objective swallow assessment, and ensuring pt is correctly completing the individualized HEP.  OBJECTIVE IMPAIRMENTS: include voice disorder and dysphagia. These impairments are limiting patient from household responsibilities, ADLs/IADLs, effectively communicating at home and in community, and safety when swallowing. Factors affecting potential to achieve goals and functional outcome are  none noted today . Patient will benefit from skilled SLP services to address above impairments and improve overall function.  REHAB POTENTIAL: Good   GOALS: Goals reviewed with patient? Yes SHORT TERM GOALS: Target: 3rd total session   1. Pt will compelte HEP with modified independence in 2 sessions Baseline: Goal status: INITIAL   2.  pt will tell SLP why pt is completing HEP with modified independence Baseline:  Goal status: Met   3.  pt will describe 3 overt s/s aspiration PNA with modified independence Baseline:  Goal status: INITIAL   4.  pt will tell SLP how a  food journal could hasten return to a more normalized diet Baseline:  Goal status: Met     LONG TERM GOALS: Target: 7th total session   1.  pt will complete HEP with independence over two visits Baseline:  Goal status: INITIAL   2.  pt will describe how to modify HEP over time, and the timeline associated with reduction in HEP frequency with modified independence over two sessions Baseline:  Goal status: INITIAL   PLAN:   SLP FREQUENCY:  once approx every 4 weeks   SLP DURATION:  7 sessions   PLANNED INTERVENTIONS: Aspiration precaution training, Pharyngeal strengthening exercises, Diet toleration management , Trials of upgraded texture/liquids, SLP instruction and feedback, Compensatory strategies, Patient/family education, and vocal hygiene (if necessary).   Rickardo Brinegar, CCC-SLP 04/16/2023, 2:00 PM

## 2023-04-16 NOTE — Patient Instructions (Signed)
SWALLOWING EXERCISES Do these 5-6 days/week until 6 months after your last day of radiation, then 2 days per week afterwards You can use 1-2 drops of liquid to help you swallow, if your mouth gets dry  Effortful Swallows - Press your tongue against the roof of your mouth for 3 seconds, then swallow as hard as you can - Do at least 20 reps/day, in sets of 5-10  Masako Swallow - swallow with your tongue sticking out - Stick tongue out past your lips and gently bite tongue with your teeth - Swallow, while holding your tongue with your teeth - Do at least 20 reps/day, in sets of 5-10   Shaker Exercise - head lift - Lie flat on your back in your bed, the floor, or a couch  - Raise your head and look at your feet - KEEP YOUR SHOULDERS DOWN - HOLD FOR 45-60 SECONDS, then lower your head back down - Repeat 3 times, 2-3 times a day  [employer] - "squeeze swallow" exercise - Swallow, and squeeze tight to keep your Adam's Apple up - Hold the squeeze for 5-7 seconds - then relax - Do at least 20 reps/day, in sets of 5-10  5.   Chin tuck - Place a rolled up towel (4 inches wide) under your chin near your neck - Tuck your chin and push hard on the towel for 5 seconds - Do at least 20 reps/day, in sets of 5-10       6.  "Super Swallow"  - Take a breath and hold it  - Swallow then IMMEDIATELY cough  - Do at least 20 reps/day, in sets of 5-10     SWALLOWING EXERCISES Do these until 6 months after your last day of radiation, then 2-3 times per week afterwards You can use a wet spoon to swallow something, if your mouth gets dry  Effortful Swallows - Press your tongue against the roof of your mouth for 3 seconds, then swallow your saliva (or a couple drops of water) as hard as you can  - Do at least 20 per day, at least 5 at a time       8. "Super Swallow"  - Take a breath and hold it  - Swallow then IMMEDIATELY cough  - Do at least 20 per day, at least 5 at a time         3.  "Siren" exercise  - Say "eeeee" with a low note and glide up with your voice as high as you can, then back down as low as you can  - Do at least 20 per day, at least 5 at a time

## 2023-04-22 NOTE — Progress Notes (Signed)
Radiation Oncology         [phone] ________________________________  Name: William Perkins MRN: [id number]  Date: 04/26/2023  DOB: 1950-06-02  Follow-Up Visit Note  CC: [name], DO  Read Drivers, MD  Diagnosis and Prior Radiotherapy:    No diagnosis found.  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 He completed his treatment on 04/08/2023.  ***                    ALLERGIES:  has no known allergies.  Meds: Current Outpatient Medications  Medication Sig Dispense Refill   acetaminophen (TYLENOL) 500 MG tablet Take 1,000 mg by mouth every 6 (six) hours as needed for mild pain or moderate pain.     amLODipine (NORVASC) 10 MG tablet Take 1 tablet (10 mg total) by mouth daily. 90 tablet 3   aspirin EC 81 MG tablet Take 81 mg by mouth daily. Swallow whole.     cloNIDine (CATAPRES) 0.1 MG tablet Take 1 tablet (0.1 mg total) by mouth 2 (two) times daily. 180 tablet 3   hydrALAZINE (APRESOLINE) 50 MG tablet Take 50 mg by mouth 2 (two) times daily.     lidocaine (XYLOCAINE) 2 % solution Patient: Mix 1part 2% viscous lidocaine, 1part H20. Swallow 10mL of diluted mixture, before meals and at bedtime, up to QID 200 mL 3    lisinopril (ZESTRIL) 40 MG tablet Take 1 tablet (40 mg total) by mouth daily. 90 tablet 3   oxyCODONE (ROXICODONE) 5 MG immediate release tablet Take 1 tablet (5 mg total) by mouth every 4 (four) hours as needed for severe pain (pain score 7-10). 45 tablet 0   polyethylene glycol (MIRALAX / GLYCOLAX) 17 g packet Take 17 g by mouth 2 (two) times daily. (Patient taking differently: Take 17 g by mouth daily as needed for mild constipation or moderate constipation.) 14 each 25   rosuvastatin (CRESTOR) 20 MG tablet Take 20 mg by mouth daily.     sodium bicarbonate 650 MG tablet Take 2 tablets (1,300 mg total) by mouth 2 (two) times daily. 360 tablet 3   No current facility-administered medications for this visit.    Physical Findings: The patient is in no acute distress. Patient is alert and oriented. Wt Readings from Last 3 Encounters:  02/11/23 137 lb 8 oz (62.4 kg)  01/24/23 140 lb (63.5 kg)  01/17/23 140 lb (63.5 kg)    vitals were not taken for this visit. .  General: Alert and oriented, in no acute distress HEENT: Head is normocephalic. Extraocular movements are intact. Oropharynx is notable for *** Neck: Neck is notable for *** Skin: Skin in treatment fields shows satisfactory healing *** Heart: Regular in rate and rhythm with no murmurs, rubs, or gallops. Chest: Clear to auscultation bilaterally, with  no rhonchi, wheezes, or rales. Abdomen: Soft, nontender, nondistended, with no rigidity or guarding. Extremities: No cyanosis or edema. Lymphatics: see Neck Exam Psychiatric: Judgment and insight are intact. Affect is appropriate.   Lab Findings: Lab Results  Component Value Date   WBC 6.2 01/24/2023   HGB 11.2 (L) 01/24/2023   HCT 34.9 (L) 01/24/2023   MCV 90.4 01/24/2023   PLT 244 01/24/2023    Lab Results  Component Value Date   TSH 0.870 02/15/2023    Radiographic Findings: No results found.  Impression/Plan:    1) Head and Neck Cancer Status: ***  2)  Nutritional Status: *** PEG tube: ***  3) Risk Factors: The patient has been educated about risk factors including alcohol and tobacco abuse; they understand that avoidance of alcohol and tobacco is important to prevent recurrences as well as other cancers  4) Swallowing: ***  5) Dental: Encouraged to continue regular followup with dentistry, and dental hygiene including fluoride rinses. ***  6) Thyroid function:  Lab Results  Component Value Date   TSH 0.870 02/15/2023    7) Other: ***  8) PET in 2.5 months with an office visit to follow. The patient was encouraged to call with any issues or questions before then.  On date of service, in total, I spent *** minutes on this encounter. Patient was seen in person. _____________________________________   Lonie Peak, MD ***

## 2023-04-24 ENCOUNTER — Ambulatory Visit: Payer: Medicare Other | Admitting: Radiation Oncology

## 2023-04-24 ENCOUNTER — Ambulatory Visit: Payer: Self-pay | Admitting: Radiation Oncology

## 2023-04-25 NOTE — Progress Notes (Signed)
Patient presents today for a two week follow up. He completed radiation treatment for a glottis carcinoma on 04/08/2023.   Pain issues, if any: Patient denies having any throat pain. Using a feeding tube?: Patient denies. Weight changes, if any: Patient has lost weight over the course of his treatment. Swallowing issues, if any: Patient denies Smoking or chewing tobacco? Patient denies Using fluoride toothpaste daily? Patient has dentures Last ENT visit was on: November 2024 Other notable issues, if any:  Patient is doing well after radiation but he is experiencing voice hoarseness and a raspy voice.

## 2023-04-26 ENCOUNTER — Ambulatory Visit
Admission: RE | Admit: 2023-04-26 | Discharge: 2023-04-26 | Disposition: A | Discharge status: Home / Self Care | Payer: Medicare Other | Source: Ambulatory Visit | Attending: Radiation Oncology | Admitting: Radiation Oncology

## 2023-04-26 VITALS — BP 128/72 | HR 70 | Temp 97.7°F | Resp 18 | Ht 71.0 in | Wt 133.1 lb

## 2023-04-26 DIAGNOSIS — C320 Malignant neoplasm of glottis: Secondary | ICD-10-CM | POA: Diagnosis not present

## 2023-04-26 DIAGNOSIS — Z7982 Long term (current) use of aspirin: Secondary | ICD-10-CM | POA: Diagnosis not present

## 2023-04-26 DIAGNOSIS — Z79899 Other long term (current) drug therapy: Secondary | ICD-10-CM | POA: Diagnosis not present

## 2023-04-26 DIAGNOSIS — Z87891 Personal history of nicotine dependence: Secondary | ICD-10-CM | POA: Insufficient documentation

## 2023-05-02 ENCOUNTER — Other Ambulatory Visit (INDEPENDENT_AMBULATORY_CARE_PROVIDER_SITE_OTHER): Payer: Self-pay

## 2023-05-02 DIAGNOSIS — E041 Nontoxic single thyroid nodule: Secondary | ICD-10-CM

## 2023-05-15 ENCOUNTER — Ambulatory Visit: Payer: Medicare Other | Attending: Radiation Oncology

## 2023-06-13 ENCOUNTER — Ambulatory Visit: Admitting: Internal Medicine

## 2023-06-13 ENCOUNTER — Encounter: Payer: Self-pay | Admitting: Internal Medicine

## 2023-06-13 VITALS — BP 119/68 | HR 67 | Temp 98.3°F | Ht 71.0 in | Wt 136.2 lb

## 2023-06-13 DIAGNOSIS — I739 Peripheral vascular disease, unspecified: Secondary | ICD-10-CM

## 2023-06-13 DIAGNOSIS — E1122 Type 2 diabetes mellitus with diabetic chronic kidney disease: Secondary | ICD-10-CM | POA: Diagnosis not present

## 2023-06-13 DIAGNOSIS — R011 Cardiac murmur, unspecified: Secondary | ICD-10-CM

## 2023-06-13 DIAGNOSIS — I129 Hypertensive chronic kidney disease with stage 1 through stage 4 chronic kidney disease, or unspecified chronic kidney disease: Secondary | ICD-10-CM | POA: Diagnosis not present

## 2023-06-13 DIAGNOSIS — I10 Essential (primary) hypertension: Secondary | ICD-10-CM

## 2023-06-13 DIAGNOSIS — I6521 Occlusion and stenosis of right carotid artery: Secondary | ICD-10-CM | POA: Diagnosis not present

## 2023-06-13 DIAGNOSIS — N184 Chronic kidney disease, stage 4 (severe): Secondary | ICD-10-CM | POA: Diagnosis not present

## 2023-06-13 DIAGNOSIS — N183 Chronic kidney disease, stage 3 unspecified: Secondary | ICD-10-CM | POA: Diagnosis not present

## 2023-06-13 NOTE — Assessment & Plan Note (Signed)
 Last patient A1C and microalbumin/creatinine ratio 7 months ago 6.2 and 416, respectively. Patient denies polyphagia, polydipsia, polyuria, neuropathy.   Will plan to recheck A1C at follow up in 3 months.

## 2023-06-13 NOTE — Progress Notes (Unsigned)
 This is a Psychologist, occupational Note.  The care of the patient was discussed with Dr. Mikey Bussing and the assessment and plan was formulated with their assistance.  Please see their note for official documentation of the patient encounter.   Subjective:   Patient ID: William Perkins male   DOB: 1951-02-19 73 y.o.   MRN: [id number]  HPI: William Perkins is a 73 y.o. man with PMH of HTN, PAD, internal carotid artery stenosis, glottis carcinoma, T2DM, CKD, HLD who presents today for a follow up. Patient relays that he has currently finished with radiation therapy for glottis carcinoma and per epic he has a pending follow up with rad onc 10/29/23. He is currently taking tylenol 500 mg once a week for pain, hydralazine 50 mg BID, clonidine 0.1 mg BID, amlodipine 10 mg every day, sodium bicarbonate BID, lisinopril 40 mg every day, rosuvastatin 20 mg every day and is tolerating them well. He denies neuropathic symptoms, polyphagia, polydipsia, polyuria, dysuria, acid reflux, dysphagia, abnormal bleeding, coughing, fever, chills, nausea, vomiting, diarrhea, and fatigue. He endorses constipation, but takes metamucil for it and finds that this helps.   For the details of today's visit, please refer to the assessment and plan.     Past Medical History:  Diagnosis Date   Chronic renal insufficiency 03/21/2006   CrCl <50 ml   DDD (degenerative disc disease), cervical    GERD 03/22/2006   Qualifier: Diagnosis of  By: Danae Chen     GERD (gastroesophageal reflux disease) 03/22/2006   Hand numbness 10/07/2009   pt states not any longer   Hyperlipidemia 03/22/2008   Hypertension    Hypertension 03/22/2006   Left ventricular hypertrophy 03/22/2006   Leg pain, left 11/15/2009   Lumbar strain 07/15/2008   Renal insufficiency    Soft tissue disorder 08/2008   Tobacco abuse 03/22/2006   2 ppd x 30 years   Current Outpatient Medications  Medication Sig Dispense Refill   acetaminophen (TYLENOL) 500 MG  tablet Take 1,000 mg by mouth every 6 (six) hours as needed for mild pain or moderate pain.     amLODipine (NORVASC) 10 MG tablet Take 1 tablet (10 mg total) by mouth daily. 90 tablet 3   aspirin EC 81 MG tablet Take 81 mg by mouth daily. Swallow whole.     cloNIDine (CATAPRES) 0.1 MG tablet Take 1 tablet (0.1 mg total) by mouth 2 (two) times daily. 180 tablet 3   hydrALAZINE (APRESOLINE) 50 MG tablet Take 50 mg by mouth 2 (two) times daily.     lidocaine (XYLOCAINE) 2 % solution Patient: Mix 1part 2% viscous lidocaine, 1part H20. Swallow 10mL of diluted mixture, before meals and at bedtime, up to QID 200 mL 3   lisinopril (ZESTRIL) 40 MG tablet Take 1 tablet (40 mg total) by mouth daily. 90 tablet 3   oxyCODONE (ROXICODONE) 5 MG immediate release tablet Take 1 tablet (5 mg total) by mouth every 4 (four) hours as needed for severe pain (pain score 7-10). 45 tablet 0   polyethylene glycol (MIRALAX / GLYCOLAX) 17 g packet Take 17 g by mouth 2 (two) times daily. (Patient taking differently: Take 17 g by mouth daily as needed for mild constipation or moderate constipation.) 14 each 25   rosuvastatin (CRESTOR) 20 MG tablet Take 20 mg by mouth daily.     sodium bicarbonate 650 MG tablet Take 2 tablets (1,300 mg total) by mouth 2 (two) times daily. 360 tablet 3  No current facility-administered medications for this visit.   Family History  Problem Relation Age of Onset   Diabetes Mother    Diabetes Sister    Diabetes Brother    Heart disease Brother    Rectal cancer Neg Hx    Stomach cancer Neg Hx    Colon cancer Neg Hx    Social History   Socioeconomic History   Marital status: Married    Spouse name: Anette   Number of children: 0   Years of education: 12   Highest education level: Not on file  Occupational History   Occupation: Retired  Tobacco Use   Smoking status: Former    Current packs/day: 0.00    Average packs/day: 1 pack/day for 44.0 years (44.0 ttl pk-yrs)    Types:  Cigarettes    Start date: 11/21/1969    Quit date: 11/21/2013    Years since quitting: 9.5   Smokeless tobacco: Never   Tobacco comments:    Stopped 11/21/2013   Vaping Use   Vaping status: Never Used  Substance and Sexual Activity   Alcohol use: No    Alcohol/week: 0.0 standard drinks of alcohol    Comment: Quit 2015   Drug use: No   Sexual activity: Yes    Partners: Female  Other Topics Concern   Not on file  Social History Narrative   Not on file   Social Drivers of Health   Financial Resource Strain: Low Risk  (02/19/2023)   Overall Financial Resource Strain (CARDIA)    Difficulty of Paying Living Expenses: Not hard at all  Food Insecurity: No Food Insecurity (02/19/2023)   Hunger Vital Sign    Worried About Running Out of Food in the Last Year: Never true    Ran Out of Food in the Last Year: Never true  Transportation Needs: No Transportation Needs (02/19/2023)   PRAPARE - Administrator, Civil Service (Medical): No    Lack of Transportation (Non-Medical): No  Physical Activity: Insufficiently Active (09/19/2022)   Exercise Vital Sign    Days of Exercise per Week: 7 days    Minutes of Exercise per Session: 10 min  Stress: No Stress Concern Present (09/19/2022)   [employer] of Occupational Health - Occupational Stress Questionnaire    Feeling of Stress : Not at all  Social Connections: Moderately Isolated (09/19/2022)   Social Connection and Isolation Panel [NHANES]    Frequency of Communication with Friends and Family: Once a week    Frequency of Social Gatherings with Friends and Family: Three times a week    Attends Religious Services: Never    Active Member of Clubs or Organizations: No    Attends Banker Meetings: Never    Marital Status: Married   Review of Systems: Pertinent items are noted in HPI. Objective:  Physical Exam: Vitals:   06/13/23 1054  BP: 119/68  Pulse: 67  Temp: 98.3 F (36.8 C)  TempSrc: Oral  SpO2: 100%   Weight: 136 lb 3.2 oz (61.8 kg)  Height: 5\' 11"  (1.803 m)    Cardiovascular: RRR. 2/6 systolic murmur appreciated over tricuspid and mitral areas.  Pulmonary/Chest: CTAB  Extremities: No pretibial edema.  Psychiatric: Normal mood and affect  Assessment & Plan:   Essential hypertension Assessment: Patient completes medication regimen for HTN and has tolerated it well. BP good today 119/68.   Plan: Patient may continue on regimen of amlodipine, clonidine, hydralazine, and lisinopril   Type 2 diabetes mellitus with  stage 3 chronic kidney disease, without long-term current use of insulin (HCC) Last patient A1C and microalbumin/creatinine ratio 7 months ago 6.2 and 416, respectively. Patient denies polyphagia, polydipsia, polyuria, neuropathy.   Will plan to recheck A1C at follow up in 3 months.   Internal carotid artery stenosis, right CT scan done 02/2023 revealed extensive atherosclerosis, flow reducing stenosis suspected at the left subclavian origin and proximal right ICA. Patient does not have sxs of dizziness, palpitations. Physical exam reveals no carotid bruits.   Plan: [place] of carotid ordered  PAD (peripheral artery disease) (HCC) Patient taking 81 mg aspirin daily and crestor and tolerating these medications.   Patient may continue to take these medications and will recheck lipid panel at next visit.   Systolic murmur Patient relays that in December at his radiation oncology visit he was told that he has a murmur and to follow up with PCP. On physical exam today, 2/6 systolic murmur appreciated over the tricuspid and mitral areas, likely either tricuspid or mitral regurgitation murmur. Patient has no dizziness, dyspnea, orthopnea, PND, palpitations, fatigue.   Since patient is not symptomatic with 2/6 systolic murmur, no echocardiogram needed currently. Will follow up in 3 months

## 2023-06-13 NOTE — Assessment & Plan Note (Signed)
 Assessment: Patient completes medication regimen for HTN and has tolerated it well. BP good today 119/68.   Plan: Patient may continue on regimen of amlodipine, clonidine, hydralazine, and lisinopril

## 2023-06-13 NOTE — Assessment & Plan Note (Addendum)
 Patient relays that in December at his radiation oncology visit he was told that he has a murmur and to follow up with PCP. On physical exam today, 2/6 systolic murmur appreciated over the tricuspid and mitral areas, likely either tricuspid or mitral regurgitation murmur. Patient has no dizziness, dyspnea, orthopnea, PND, palpitations, fatigue.   Since patient is not symptomatic with 2/6 systolic murmur, no echocardiogram needed currently. Will follow up in 3 months

## 2023-06-13 NOTE — Assessment & Plan Note (Signed)
 Patient taking 81 mg aspirin daily and crestor and tolerating these medications.   Patient may continue to take these medications and will recheck lipid panel at next visit.

## 2023-06-13 NOTE — Assessment & Plan Note (Signed)
 CT scan done 02/2023 revealed extensive atherosclerosis, flow reducing stenosis suspected at the left subclavian origin and proximal right ICA. Patient does not have sxs of dizziness, palpitations. Physical exam reveals no carotid bruits.   Plan: [place] of carotid ordered

## 2023-06-19 NOTE — Progress Notes (Signed)
 Oncology Nurse Navigator Documentation   [name] will see Dr. Lydia Sams for follow up on 07/08/23 but needs a Thyroid US  beforehand to evaluate thyroid nodules observed on a 02/2023 CT neck done before his treatment for head and neck cancer. I have set up an appointment on 4/21 at 1:00 for the US . I called [name] to notify him of the date/time and he is agreeable.  Lynetta Saran RN, BSN, OCN Head & Neck Oncology Nurse Navigator  [facility] Phone # [phone]  Fax # [phone]

## 2023-06-20 DIAGNOSIS — N184 Chronic kidney disease, stage 4 (severe): Secondary | ICD-10-CM | POA: Diagnosis not present

## 2023-06-20 DIAGNOSIS — E559 Vitamin D deficiency, unspecified: Secondary | ICD-10-CM | POA: Diagnosis not present

## 2023-06-20 DIAGNOSIS — I129 Hypertensive chronic kidney disease with stage 1 through stage 4 chronic kidney disease, or unspecified chronic kidney disease: Secondary | ICD-10-CM | POA: Diagnosis not present

## 2023-06-20 DIAGNOSIS — D631 Anemia in chronic kidney disease: Secondary | ICD-10-CM | POA: Diagnosis not present

## 2023-06-24 ENCOUNTER — Encounter (HOSPITAL_COMMUNITY)
Admission: RE | Admit: 2023-06-24 | Discharge: 2023-06-24 | Disposition: A | Discharge status: Home / Self Care | Source: Ambulatory Visit | Attending: Otolaryngology | Admitting: Otolaryngology

## 2023-06-24 DIAGNOSIS — E042 Nontoxic multinodular goiter: Secondary | ICD-10-CM | POA: Diagnosis not present

## 2023-06-24 DIAGNOSIS — E041 Nontoxic single thyroid nodule: Secondary | ICD-10-CM | POA: Insufficient documentation

## 2023-07-08 ENCOUNTER — Ambulatory Visit (INDEPENDENT_AMBULATORY_CARE_PROVIDER_SITE_OTHER): Payer: Medicare Other | Admitting: Otolaryngology

## 2023-07-08 ENCOUNTER — Encounter (INDEPENDENT_AMBULATORY_CARE_PROVIDER_SITE_OTHER): Payer: Self-pay

## 2023-07-08 VITALS — BP 100/62 | HR 78 | Ht 71.0 in | Wt 133.0 lb

## 2023-07-08 DIAGNOSIS — C329 Malignant neoplasm of larynx, unspecified: Secondary | ICD-10-CM

## 2023-07-08 DIAGNOSIS — E041 Nontoxic single thyroid nodule: Secondary | ICD-10-CM

## 2023-07-08 DIAGNOSIS — R490 Dysphonia: Secondary | ICD-10-CM

## 2023-07-08 NOTE — Progress Notes (Signed)
 Dear Dr. Adriane Albe, Here is my assessment for our mutual patient, Nadav Bazurto. Thank you for allowing me the opportunity to care for your patient. Please do not hesitate to contact me should you have any other questions. Sincerely, Dr. Milon Aloe  Otolaryngology Clinic Note Referring provider: Dr. Adriane Albe HPI:  William Perkins is a 73 y.o. male kindly referred by Dr. 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 S/p RT for T2N0 SCCa Right VF and some supraglottic extension, finished Feb 2025. --------------------------------------------------------- 07/08/2023 Here for follow up. He reports he is doing well overall. Voice is better, no dysphagia or odynophagia or trouble breathing; no aspiration episodes. He is maintaining his weight. Did have a thy US  which showed multiple nodules.   PMHx: GERD, CKD, DDD, HLD, HTN, T2DM  H&N Surgery: no Personal or FHx of bleeding dz or anesthesia difficulty: no   AP/AC: ASA 81  Tobacco: prior smoker, quit 9 years ago (45 pack year history). Alcohol: no. Occupation: Estate manager/land agent (retired). Lives in [place]  Independent Review of Additional Tests or Records:  PCP notes reviewed Dr. Lydia Sams (nephro notes) unable to review TSH 02/05/2023: wnl US  Thyroid  independently interpreted (06/28/2023): agree with read   PMH/Meds/All/SocHx/FamHx/ROS:   Past Medical History:  Diagnosis Date   Chronic renal insufficiency 03/21/2006   CrCl <50 ml   DDD (degenerative disc disease), cervical    GERD 03/22/2006   Qualifier: Diagnosis of  By: Curtis Dow     GERD (gastroesophageal reflux disease) 03/22/2006   Hand numbness 10/07/2009   pt states not any longer   Hyperlipidemia 03/22/2008   Hypertension    Hypertension 03/22/2006   Left ventricular hypertrophy 03/22/2006   Leg pain, left 11/15/2009   Lumbar strain 07/15/2008   Renal insufficiency    Soft tissue disorder 08/2008   Tobacco abuse 03/22/2006   2 ppd x 30 years     Past  Surgical History:  Procedure Laterality Date   ABDOMINAL AORTOGRAM W/LOWER EXTREMITY N/A 05/18/2020   Procedure: ABDOMINAL AORTOGRAM W/LOWER EXTREMITY;  Surgeon: Carlene Che, MD;  Location: [facility] INVASIVE [facility];  Service: Cardiovascular;  Laterality: N/A;   BACK SURGERY  2020   PLIF   COLONOSCOPY     DIRECT LARYNGOSCOPY N/A 01/24/2023   Procedure: DIRECT LARYNGOSCOPY;  Surgeon: Evelina Hippo, MD;  Location[facility];  Service: ENT;  Laterality: N/A;  DIRECT LARYNGOSCOPY WITH BIOPSY OF VOCAL CORD LESION   ENDARTERECTOMY FEMORAL Right 05/20/2020   Procedure: RIGHT EXTENDED FEMORAL ENDARTERECTOMY AND PROFUNDAPLASTY;  Surgeon: Carlene Che, MD;  Location: [facility] OR;  Service: Vascular;  Laterality: Right;   WISDOM TOOTH EXTRACTION      Family History  Problem Relation Age of Onset   Diabetes Mother    Diabetes Sister    Diabetes Brother    Heart disease Brother    Rectal cancer Neg Hx    Stomach cancer Neg Hx    Colon cancer Neg Hx      Social Connections: Moderately Isolated (09/19/2022)   Social Connection and Isolation Panel [NHANES]    Frequency of Communication with Friends and Family: Once a week    Frequency of Social Gatherings with Friends and Family: Three times a week    Attends Religious Services: Never    Active Member of Clubs or Organizations: No    Attends Banker Meetings: Never    Marital Status: Married      Current Outpatient Medications:    acetaminophen  (TYLENOL ) 500 MG tablet, Take  1,000 mg by mouth every 6 (six) hours as needed for mild pain or moderate pain., Disp: , Rfl:    amLODipine  (NORVASC ) 10 MG tablet, Take 1 tablet (10 mg total) by mouth daily., Disp: 90 tablet, Rfl: 3   aspirin  EC 81 MG tablet, Take 81 mg by mouth daily. Swallow whole., Disp: , Rfl:    cloNIDine  (CATAPRES ) 0.1 MG tablet, Take 1 tablet (0.1 mg total) by mouth 2 (two) times daily., Disp: 180 tablet, Rfl: 3   hydrALAZINE  (APRESOLINE ) 50 MG tablet, Take 50 mg by mouth 2  (two) times daily., Disp: , Rfl:    lidocaine  (XYLOCAINE ) 2 % solution, Patient: Mix 1part 2% viscous lidocaine , 1part H20. Swallow 10mL of diluted mixture, before meals and at bedtime, up to QID, Disp: 200 mL, Rfl: 3   lisinopril  (ZESTRIL ) 40 MG tablet, Take 1 tablet (40 mg total) by mouth daily., Disp: 90 tablet, Rfl: 3   oxyCODONE  (ROXICODONE ) 5 MG immediate release tablet, Take 1 tablet (5 mg total) by mouth every 4 (four) hours as needed for severe pain (pain score 7-10)., Disp: 45 tablet, Rfl: 0   polyethylene glycol (MIRALAX  / GLYCOLAX ) 17 g packet, Take 17 g by mouth 2 (two) times daily. (Patient taking differently: Take 17 g by mouth daily as needed for mild constipation or moderate constipation.), Disp: 14 each, Rfl: 25   rosuvastatin  (CRESTOR ) 20 MG tablet, Take 20 mg by mouth daily., Disp: , Rfl:    sodium bicarbonate  650 MG tablet, Take 2 tablets (1,300 mg total) by mouth 2 (two) times daily., Disp: 360 tablet, Rfl: 3   Physical Exam:   BP 100/62 (BP Location: Left Arm, Patient Position: Sitting, Cuff Size: Normal)   Pulse 78   Ht 5\' 11"  (1.803 m)   Wt 133 lb (60.3 kg)   SpO2 93%   BMI 18.55 kg/m   Salient findings:  CN II-XII intact  Bilateral EAC clear and TM intact with well pneumatized middle ear spaces Anterior rhinoscopy: Septum midline; bilateral inferior turbinates without hypertrophy No lesions of oral cavity/oropharynx; edentulous No obviously palpable neck masses/lymphadenopathy/thyromegaly; mild post-radiation changes No respiratory distress or stridor; voice quality class III; given complaints, TFL was indicated and performed below  Seprately Identifiable Procedures:  Procedure Note Pre-procedure diagnosis: squamous cell carcinoma of vocal fold Post-procedure diagnosis: Same Procedure: Transnasal Fiberoptic Laryngoscopy, CPT 52841 - Mod 25 Indication: dysphonia Complications: None apparent EBL: 0 mL Date: 07/08/23   The procedure was undertaken to  further evaluate the patient's complaints above, with mirror exam inadequate for appropriate examination due to gag reflex and poor patient tolerance  Procedure:  Patient was identified as correct patient. Verbal consent was obtained. The nose was sprayed with oxymetazoline  and 4% lidocaine . The The flexible laryngoscope was passed through the nose to view the nasal cavity, pharynx (oropharynx, hypopharynx) and larynx.  The larynx was examined at rest and during multiple phonatory tasks. Documentation was obtained and reviewed with patient. The scope was removed. The patient tolerated the procedure well.  Findings: The nasal cavity and nasopharynx did not reveal any masses or lesions, mucosa appeared to be without obvious lesions. The tongue base, pharyngeal walls, piriform sinuses, vallecula, epiglottis and postcricoid region are normal in appearance without masses; The visualized portion of the subglottis and proximal trachea is widely patent. The vocal folds are mobile bilaterally - resolution of prior mass, with no mucosal changes seen today concerning for recurrence (slight erythema right VF); left vocal fold without lesions  Electronically signed by: Evelina Hippo, MD 07/08/2023 9:36 AM   Impression & Plans:  William Perkins is a 73 y.o. male with:  1. Thyroid  nodule   2. Squamous cell carcinoma of larynx (HCC)   3. Dysphonia    T2Nx SCCa V s/p RT. NED today  For thyroid  nodules, we discussed FNA but he wishes to forego; will plan Thy US  in 1 year Continue SLP swallowing exercises Scheduled to see Rad/Onc in August 2025 who will scope patient reportedly so will see in Nov 2025.   Thank you for allowing me the opportunity to care for your patient. Please do not hesitate to contact me should you have any other questions.  Sincerely, Milon Aloe, MD Otolarynoglogist (ENT), [facility] Phone: [phone] Fax: [phone]  07/08/2023, 9:36 AM   MDM:  Level 4:  99214 Complexity/Problems addressed: mod - multiple chronic problems Data complexity: mod - independent review of US  - Morbidity: low currently  - Prescription Drug prescribed or managed: no

## 2023-09-03 NOTE — Addendum Note (Signed)
 Addended by: ROSAN PRIDE C on: 09/03/2023 09:20 AM   Modules accepted: Orders

## 2023-09-10 ENCOUNTER — Other Ambulatory Visit: Payer: Self-pay

## 2023-09-10 MED ORDER — ROSUVASTATIN CALCIUM 20 MG PO TABS: 20.0000 mg | ORAL_TABLET | Freq: Every day | ORAL | 3 refills | Status: DC

## 2023-09-12 ENCOUNTER — Encounter: Admitting: Student

## 2023-09-16 ENCOUNTER — Ambulatory Visit: Admitting: Student

## 2023-09-16 VITALS — BP 86/56 | HR 62 | Temp 98.4°F | Ht 71.0 in | Wt 136.8 lb

## 2023-09-16 DIAGNOSIS — I129 Hypertensive chronic kidney disease with stage 1 through stage 4 chronic kidney disease, or unspecified chronic kidney disease: Secondary | ICD-10-CM | POA: Diagnosis not present

## 2023-09-16 DIAGNOSIS — I739 Peripheral vascular disease, unspecified: Secondary | ICD-10-CM

## 2023-09-16 DIAGNOSIS — N184 Chronic kidney disease, stage 4 (severe): Secondary | ICD-10-CM | POA: Diagnosis not present

## 2023-09-16 DIAGNOSIS — E1122 Type 2 diabetes mellitus with diabetic chronic kidney disease: Secondary | ICD-10-CM

## 2023-09-16 DIAGNOSIS — N1832 Chronic kidney disease, stage 3b: Secondary | ICD-10-CM

## 2023-09-16 DIAGNOSIS — E042 Nontoxic multinodular goiter: Secondary | ICD-10-CM

## 2023-09-16 DIAGNOSIS — I6521 Occlusion and stenosis of right carotid artery: Secondary | ICD-10-CM | POA: Diagnosis not present

## 2023-09-16 DIAGNOSIS — I10 Essential (primary) hypertension: Secondary | ICD-10-CM | POA: Diagnosis not present

## 2023-09-16 LAB — POCT GLYCOSYLATED HEMOGLOBIN (HGB A1C): Hemoglobin A1C: 6.9 % — AB (ref 4.0–5.6)

## 2023-09-16 LAB — GLUCOSE, CAPILLARY: Glucose-Capillary: 331 mg/dL — ABNORMAL HIGH (ref 70–99)

## 2023-09-16 MED ORDER — ROSUVASTATIN CALCIUM 20 MG PO TABS: 20.0000 mg | ORAL_TABLET | Freq: Every day | ORAL | 3 refills | Status: DC

## 2023-09-16 NOTE — Patient Instructions (Signed)
 Thank you, [name] for allowing us  to provide your care today. Today we discussed   I order ultrasound of your R arm STOP TAKING HYDRALAZINE  CHECK your blood pressures daily and record it on a piece of paper WE will check your blood today Please watch your diet of sugars and carb.     I have ordered the following labs for you:   Lab Orders         Glucose, capillary         Lipid Profile         BMP w Anion Gap (STAT/Sunquest-performed on-site)         POC Hbg A1C      Tests ordered today:  As above   Referrals ordered today:   Referral Orders  No referral(s) requested today     I have ordered the following medication/changed the following medications:   Stop the following medications: Medications Discontinued During This Encounter  Medication Reason   rosuvastatin  (CRESTOR ) 20 MG tablet Reorder   hydrALAZINE  (APRESOLINE ) 50 MG tablet      Start the following medications: Meds ordered this encounter  Medications   rosuvastatin  (CRESTOR ) 20 MG tablet    Sig: Take 1 tablet (20 mg total) by mouth daily.    Dispense:  90 tablet    Refill:  3     Follow up: 2 weeks for blood pressure with RN, 3 months for diabetes     Remember:   Should you have any questions or concerns please call the internal medicine clinic at [phone].     William Perkins, D.O. [facility]

## 2023-09-16 NOTE — Progress Notes (Unsigned)
 Established Patient Office Visit  Subjective   Patient ID: William Perkins, male    DOB: 1950-11-21  Age: 73 y.o. MRN: [id number]  Chief Complaint  Patient presents with   Follow-up    HPI  This is a 73 year old male with past medical history of HTN, PAD, internal carotid artery stenosis, glottis carcinoma and underwent radiation treatment finished 04/2023, T2DM, CKD, HLD, systolic murmur presenting today for diabetes follow-up.  ROS   As per assessment and plan Objective:     BP (!) 86/56 (BP Location: Right Arm, Patient Position: Sitting, Cuff Size: Small)   Pulse 62   Temp 98.4 F (36.9 C) (Oral)   Ht 5' 11 (1.803 m)   Wt 136 lb 12.8 oz (62.1 kg)   SpO2 100%   BMI 19.08 kg/m  BP Readings from Last 3 Encounters:  09/16/23 (!) 86/56  07/08/23 100/62  06/13/23 119/68   Wt Readings from Last 3 Encounters:  09/16/23 136 lb 12.8 oz (62.1 kg)  07/08/23 133 lb (60.3 kg)  06/13/23 136 lb 3.2 oz (61.8 kg)      Physical Exam  General: Sitting in chair, no acute distress Cardiology: Systolic murmur present Pulmonary: Breathing comfortably Abdomen: Soft, nontender, nondistended MSK: No lower extremity edema  Results for orders placed or performed in visit on 09/16/23  Glucose, capillary  Result Value Ref Range   Glucose-Capillary 331 (H) 70 - 99 mg/dL  Lipid Profile  Result Value Ref Range   Cholesterol, Total 157 100 - 199 mg/dL   Triglycerides 889 0 - 149 mg/dL   HDL 53 >60 mg/dL   VLDL Cholesterol Cal 20 5 - 40 mg/dL   LDL Chol Calc (NIH) 84 0 - 99 mg/dL   Chol/HDL Ratio 3.0 0.0 - 5.0 ratio  Basic metabolic panel with GFR  Result Value Ref Range   Glucose 248 (H) 70 - 99 mg/dL   BUN 40 (H) 8 - 27 mg/dL   Creatinine, Ser 7.41 (H) 0.76 - 1.27 mg/dL   eGFR 25 (L) >40 fO/fpw/8.26   BUN/Creatinine Ratio 16 10 - 24   Sodium 135 134 - 144 mmol/L   Potassium 4.4 3.5 - 5.2 mmol/L   Chloride 99 96 - 106 mmol/L   CO2 18 (L) 20 - 29 mmol/L   Calcium  9.9 8.6 -  10.2 mg/dL  POC Hbg J8R  Result Value Ref Range   Hemoglobin A1C 6.9 (A) 4.0 - 5.6 %   HbA1c POC (<> result, manual entry)     HbA1c, POC (prediabetic range)     HbA1c, POC (controlled diabetic range)      Last metabolic panel Lab Results  Component Value Date   GLUCOSE 248 (H) 09/16/2023   NA 135 09/16/2023   K 4.4 09/16/2023   CL 99 09/16/2023   CO2 18 (L) 09/16/2023   BUN 40 (H) 09/16/2023   CREATININE 2.58 (H) 09/16/2023   GFRNONAA 27 (L) 01/24/2023   CALCIUM  9.9 09/16/2023   PROT 6.5 05/20/2020   ALBUMIN 3.6 05/20/2020   LABGLOB 2.8 01/20/2015   AGRATIO 1.5 01/20/2015   BILITOT 0.6 05/20/2020   ALKPHOS 44 05/20/2020   AST 18 05/20/2020   ALT 17 05/20/2020   ANIONGAP 13 01/24/2023   Last lipids Lab Results  Component Value Date   CHOL 157 09/16/2023   HDL 53 09/16/2023   LDLCALC 84 09/16/2023   TRIG 110 09/16/2023   CHOLHDL 3.0 09/16/2023   Last hemoglobin A1c Lab Results  Component Value  Date   HGBA1C 6.9 (A) 09/16/2023   Last thyroid  functions Lab Results  Component Value Date   TSH 0.870 02/15/2023      The ASCVD Risk score (Arnett DK, et al., 2019) failed to calculate for the following reasons:   The valid systolic blood pressure range is 90 to 200 mmHg    Assessment & Plan:  Patient discussed with Dr Karna   Problem List Items Addressed This Visit       Cardiovascular and Mediastinum   Essential hypertension   BP Readings from Last 3 Encounters:  09/16/23 (!) 86/56  07/08/23 100/62  06/13/23 119/68   Patient is currently taking amlodipine  10 mg daily, lisinopril  40 mg daily, hydralazine  50 mg twice daily, clonidine  0.1 mg twice daily.  Blood pressure low during this office visit with 86/56, repeat 82/52.  Patient denies any dizzy spells lightheadedness, chest pain shortness of breath.  Patient reports that he took his morning medications. -Advised to stop taking hydralazine , continue amlodipine , lisinopril , clonidine  -Advised to check  blood pressures daily, return to clinic for blood pressure check by RN in 2 weeks      Relevant Medications   rosuvastatin  (CRESTOR ) 20 MG tablet   Other Relevant Orders   Basic metabolic panel with GFR (Completed)   PAD (peripheral artery disease) (HCC)   Lab Results  Component Value Date   LDLCALC 84 09/16/2023   Patient is currently taking aspirin  81 mg daily and Crestor  20 mg daily.  LDL at goal less than 70. -Recheck LDL today      Relevant Medications   rosuvastatin  (CRESTOR ) 20 MG tablet   Other Relevant Orders   Lipid Profile (Completed)   VAS US  UPPER EXTREMITY ARTERIAL DUPLEX   Internal carotid artery stenosis, right   CT scan done 02/2023 revealed extensive atherosclerosis, flow reducing stenosis suspected at the left subclavian origin and proximal right ICA.  Patient denies any symptoms of dizziness or palpitations.  Blood pressure checks noted differences with ~127/83 on the Left arm and 86/56 on the Right arm. Patient denies any RUE pain or weakness.  - F/u US  of the carotid 09/18/2023 - Ordered VAS US  UE of Right with proximal subclavian vessels to evaluate for stenosis.       Relevant Medications   rosuvastatin  (CRESTOR ) 20 MG tablet   Other Relevant Orders   VAS US  UPPER EXTREMITY ARTERIAL DUPLEX     Endocrine   Type 2 diabetes mellitus with stage 3 chronic kidney disease, without long-term current use of insulin (HCC) - Primary   Lab Results  Component Value Date   HGBA1C 6.9 (A) 09/16/2023   Last A1c 6.2, checked on 10/18/2022. A1c today 6.9 today. CBG 331. Not on any medication currently. Reports nonadherence with diet, reports that he has been eating diet containing high glucose and carbs. -Advised lifestyle and dietary modification in the next few months, will repeat A1c in October, if A1c continues to rise, will consider starting medication: SGLT2i or GLP/DPP4. No metformin  with CrCl < 30.  -Patient is advised to call ophthalmologist to schedule an  appointment - Will schedule asn appointment with eye doctor      Relevant Medications   rosuvastatin  (CRESTOR ) 20 MG tablet   Other Relevant Orders   POC Hbg A1C (Completed)   Glucose, capillary (Completed)   Basic metabolic panel with GFR (Completed)   Multinodular goiter   Ultrasound of thyroid  06/28/23 showed multinodular goiter with predominantly benign appearing nodules.  Patient was evaluated by  ENT specialist on 07/08/2023, where he had a procedure Transnasal Fiberoptic Laryngoscopy, they noted resolution of the prior mass on the vocal coats, without mucosal changes concerning for recurrence. Patient was offered FNA of one of the nodule, patient deferred. Plan was to follow-up in 1 year with a thyroid  ultrasound and continue SLP swallowing exercises.  He was also scheduled to see Rad/Onc in August 2025; has a follow up in 01/2024 with ENT.         Genitourinary   CKD (chronic kidney disease) stage 4, GFR 15-29 ml/min (HCC)   ADDENDUM: Worsening Scr 2.58, eGFR 25; Patient is currently on ARB. Will consider SGLT2i based on BP.  - referral to nephrology is sent        Return for  2 weeks for blood pressure with RN, 3 months for diabetes.    Toma Edwards, DO

## 2023-09-17 ENCOUNTER — Ambulatory Visit: Payer: Self-pay | Admitting: Student

## 2023-09-17 DIAGNOSIS — E042 Nontoxic multinodular goiter: Secondary | ICD-10-CM | POA: Insufficient documentation

## 2023-09-17 LAB — BASIC METABOLIC PANEL WITH GFR
BUN/Creatinine Ratio: 16 (ref 10–24)
BUN: 40 mg/dL — ABNORMAL HIGH (ref 8–27)
CO2: 18 mmol/L — ABNORMAL LOW (ref 20–29)
Calcium: 9.9 mg/dL (ref 8.6–10.2)
Chloride: 99 mmol/L (ref 96–106)
Creatinine, Ser: 2.58 mg/dL — ABNORMAL HIGH (ref 0.76–1.27)
Glucose: 248 mg/dL — ABNORMAL HIGH (ref 70–99)
Potassium: 4.4 mmol/L (ref 3.5–5.2)
Sodium: 135 mmol/L (ref 134–144)
eGFR: 25 mL/min/1.73 — ABNORMAL LOW (ref >59)

## 2023-09-17 LAB — LIPID PANEL
Chol/HDL Ratio: 3 ratio (ref 0.0–5.0)
Cholesterol, Total: 157 mg/dL (ref 100–199)
HDL: 53 mg/dL (ref >39)
LDL Chol Calc (NIH): 84 mg/dL (ref 0–99)
Triglycerides: 110 mg/dL (ref 0–149)
VLDL Cholesterol Cal: 20 mg/dL (ref 5–40)

## 2023-09-17 NOTE — Assessment & Plan Note (Signed)
 CT scan done 02/2023 revealed extensive atherosclerosis, flow reducing stenosis suspected at the left subclavian origin and proximal right ICA.  Patient denies any symptoms of dizziness or palpitations.  Blood pressure checks noted differences with ~127/83 on the Left arm and 86/56 on the Right arm. Patient denies any RUE pain or weakness.  - F/u US  of the carotid 09/18/2023 - Ordered VAS US  UE of Right with proximal subclavian vessels to evaluate for stenosis.

## 2023-09-17 NOTE — Assessment & Plan Note (Signed)
 BP Readings from Last 3 Encounters:  09/16/23 (!) 86/56  07/08/23 100/62  06/13/23 119/68   Patient is currently taking amlodipine  10 mg daily, lisinopril  40 mg daily, hydralazine  50 mg twice daily, clonidine  0.1 mg twice daily.  Blood pressure low during this office visit with 86/56, repeat 82/52.  Patient denies any dizzy spells lightheadedness, chest pain shortness of breath.  Patient reports that he took his morning medications. -Advised to stop taking hydralazine , continue amlodipine , lisinopril , clonidine  -Advised to check blood pressures daily, return to clinic for blood pressure check by RN in 2 weeks

## 2023-09-17 NOTE — Assessment & Plan Note (Signed)
 Ultrasound of thyroid  06/28/23 showed multinodular goiter with predominantly benign appearing nodules.  Patient was evaluated by ENT specialist on 07/08/2023, where he had a procedure Transnasal Fiberoptic Laryngoscopy, they noted resolution of the prior mass on the vocal coats, without mucosal changes concerning for recurrence. Patient was offered FNA of one of the nodule, patient deferred. Plan was to follow-up in 1 year with a thyroid  ultrasound and continue SLP swallowing exercises.  He was also scheduled to see Rad/Onc in August 2025; has a follow up in 01/2024 with ENT.

## 2023-09-17 NOTE — Assessment & Plan Note (Signed)
 Lab Results  Component Value Date   HGBA1C 6.9 (A) 09/16/2023   Last A1c 6.2, checked on 10/18/2022. A1c today 6.9 today. CBG 331. Not on any medication currently. Reports nonadherence with diet, reports that he has been eating diet containing high glucose and carbs. -Advised lifestyle and dietary modification in the next few months, will repeat A1c in October, if A1c continues to rise, will consider starting medication: SGLT2i or GLP/DPP4. No metformin  with CrCl < 30.  -Patient is advised to call ophthalmologist to schedule an appointment - Will schedule asn appointment with eye doctor

## 2023-09-17 NOTE — Assessment & Plan Note (Signed)
 Lab Results  Component Value Date   LDLCALC 84 09/16/2023   Patient is currently taking aspirin  81 mg daily and Crestor  20 mg daily.  LDL at goal less than 70. -Recheck LDL today

## 2023-09-18 ENCOUNTER — Other Ambulatory Visit: Payer: Self-pay | Admitting: Student

## 2023-09-18 ENCOUNTER — Ambulatory Visit (HOSPITAL_COMMUNITY)
Admission: RE | Admit: 2023-09-18 | Discharge: 2023-09-18 | Disposition: A | Discharge status: Home / Self Care | Source: Ambulatory Visit | Attending: Vascular Surgery | Admitting: Vascular Surgery

## 2023-09-18 DIAGNOSIS — I6521 Occlusion and stenosis of right carotid artery: Secondary | ICD-10-CM | POA: Diagnosis not present

## 2023-09-18 NOTE — Addendum Note (Signed)
 Addended by: HEDDY BARREN on: 09/18/2023 08:47 AM   Modules accepted: Orders

## 2023-09-18 NOTE — Progress Notes (Signed)
 Internal Medicine Clinic Attending  Case discussed with the resident at the time of the visit.  We reviewed the resident's history and exam and pertinent patient test results.  I agree with the assessment, diagnosis, and plan of care documented in the resident's note.

## 2023-09-18 NOTE — Progress Notes (Signed)
 Patient returned call, patient was notified about his worsening serum creatinine function.  Patient reports that he follows [facility], will go ahead and schedule an appointment soon to discuss his kidney functions.

## 2023-09-18 NOTE — Addendum Note (Signed)
 Addended by: KARNA FELLOWS on: 09/18/2023 09:51 AM   Modules accepted: Level of Service

## 2023-09-18 NOTE — Assessment & Plan Note (Signed)
 ADDENDUM: Worsening Scr 2.58, eGFR 25; Patient is currently on ARB. Will consider SGLT2i based on BP.  - referral to nephrology is sent

## 2023-09-25 ENCOUNTER — Ambulatory Visit (HOSPITAL_COMMUNITY)
Admission: RE | Admit: 2023-09-25 | Discharge: 2023-09-25 | Disposition: A | Discharge status: Home / Self Care | Source: Ambulatory Visit | Attending: Vascular Surgery | Admitting: Vascular Surgery

## 2023-09-25 DIAGNOSIS — I6521 Occlusion and stenosis of right carotid artery: Secondary | ICD-10-CM | POA: Diagnosis not present

## 2023-09-25 DIAGNOSIS — I739 Peripheral vascular disease, unspecified: Secondary | ICD-10-CM

## 2023-09-27 ENCOUNTER — Other Ambulatory Visit: Payer: Self-pay | Admitting: Student

## 2023-09-27 DIAGNOSIS — I871 Compression of vein: Secondary | ICD-10-CM

## 2023-10-07 ENCOUNTER — Encounter: Payer: Self-pay | Admitting: Internal Medicine

## 2023-10-07 ENCOUNTER — Ambulatory Visit (INDEPENDENT_AMBULATORY_CARE_PROVIDER_SITE_OTHER): Payer: Self-pay | Admitting: Internal Medicine

## 2023-10-07 VITALS — BP 105/82 | HR 55 | Temp 98.0°F | Ht 71.0 in | Wt 135.0 lb

## 2023-10-07 DIAGNOSIS — I129 Hypertensive chronic kidney disease with stage 1 through stage 4 chronic kidney disease, or unspecified chronic kidney disease: Secondary | ICD-10-CM | POA: Diagnosis not present

## 2023-10-07 DIAGNOSIS — I10 Essential (primary) hypertension: Secondary | ICD-10-CM

## 2023-10-07 DIAGNOSIS — N1832 Chronic kidney disease, stage 3b: Secondary | ICD-10-CM

## 2023-10-07 DIAGNOSIS — E785 Hyperlipidemia, unspecified: Secondary | ICD-10-CM | POA: Diagnosis not present

## 2023-10-07 DIAGNOSIS — N184 Chronic kidney disease, stage 4 (severe): Secondary | ICD-10-CM | POA: Diagnosis not present

## 2023-10-07 DIAGNOSIS — E1122 Type 2 diabetes mellitus with diabetic chronic kidney disease: Secondary | ICD-10-CM | POA: Diagnosis not present

## 2023-10-07 DIAGNOSIS — E782 Mixed hyperlipidemia: Secondary | ICD-10-CM

## 2023-10-07 LAB — GLUCOSE, CAPILLARY: Glucose-Capillary: 151 mg/dL — ABNORMAL HIGH (ref 70–99)

## 2023-10-07 MED ORDER — ATORVASTATIN CALCIUM 80 MG PO TABS: 80.0000 mg | ORAL_TABLET | Freq: Every day | ORAL | 3 refills | Status: DC

## 2023-10-07 NOTE — Assessment & Plan Note (Signed)
 Has follow up with vascular surgery given his PAD, will aim for LDL <55.  Limited in ability to uptitrate crestor  with CKD, WIll change to Atorvastatin  80mg  daily. Rehcek lipid panel in 6 weeks, consider adding zetia if not getting to goal.

## 2023-10-07 NOTE — Assessment & Plan Note (Signed)
 Historically diet controlled. Last visit had A1c of 6.9 but elevated sugars, likely due to high added sugar beverage diet.  Has been doing better recently. Checked FS glucose today and is much better at 151.  Continue current management.

## 2023-10-07 NOTE — Progress Notes (Addendum)
 Established Patient Office Visit  Subjective   Patient ID: William Perkins, male    DOB: 1950/03/20  Age: 73 y.o. MRN: [id number]  Chief Complaint  Patient presents with   Follow-up    Test results. Check BS.   Discussed the use of AI scribe software for clinical note transcription with the patient, who gave verbal consent to proceed.  History of Present Illness William Perkins is a 73 year old male with carotid artery stenosis and elevated blood sugar who presents for follow-up on recent ultrasound results and blood sugar levels.  He returns for follow-up after a recent ultrasound revealed greater than 50% blockage in the right subclavian artery and 40-59% stenosis in the right carotid artery. The left internal carotid artery shows 1-39% stenosis. He is scheduled to see a vascular surgeon on September 23rd.  He has been taking Crestor  (rosuvastatin ) for several years, previously on pravastatin , and is currently on a 20 mg dose. His last cholesterol panel showed an LDL of 84. His blood pressure was low during a previous visit, and he was taking hydralazine  multiple times a day.  He reports a recent increase in blood sugar levels, which he attributes to consuming a high-sugar diet due to difficulty eating solid foods after radiation treatment. His A1c was 6.9 three weeks ago, with a blood sugar level of 248. He was consuming a mixture of ice cream and milk two to three times a day due to a sore throat and difficulty swallowing.       Objective:     BP 105/82 (BP Location: Right Arm, Patient Position: Sitting, Cuff Size: Normal)   Pulse (!) 55   Temp 98 F (36.7 C) (Oral)   Ht 5' 11 (1.803 m)   Wt 135 lb (61.2 kg)   SpO2 100% Comment: RA  BMI 18.83 kg/m  BP Readings from Last 3 Encounters:  10/07/23 105/82  09/16/23 (!) 86/56  07/08/23 100/62   Wt Readings from Last 3 Encounters:  10/07/23 135 lb (61.2 kg)  09/16/23 136 lb 12.8 oz (62.1 kg)  07/08/23 133 lb (60.3 kg)       Physical Exam  CHEST: Clear to auscultation bilaterally, no wheezes, rhonchi, or crackles. HEART: RRR, no murmur Ext: Trace edema     Results for orders placed or performed in visit on 10/07/23  Glucose, capillary  Result Value Ref Range   Glucose-Capillary 151 (H) 70 - 99 mg/dL    Last lipids Lab Results  Component Value Date   CHOL 157 09/16/2023   HDL 53 09/16/2023   LDLCALC 84 09/16/2023   TRIG 110 09/16/2023   CHOLHDL 3.0 09/16/2023      The 10-year ASCVD risk score (Arnett DK, et al., 2019) is: 24.3%    Assessment & Plan:   Problem List Items Addressed This Visit       Cardiovascular and Mediastinum   Essential hypertension   BP at goal, remains off Hydralazine .      Relevant Medications   atorvastatin  (LIPITOR) 80 MG tablet     Endocrine   Type 2 diabetes mellitus with stage 4 chronic kidney disease, without long-term current use of insulin (HCC) - Primary   Historically diet controlled. Last visit had A1c of 6.9 but elevated sugars, likely due to high added sugar beverage diet.  Has been doing better recently. Checked FS glucose today and is much better at 151.  Continue current management.      Relevant Medications   atorvastatin  (  LIPITOR) 80 MG tablet   Other Relevant Orders   Glucose, capillary (Completed)     Genitourinary   CKD (chronic kidney disease) stage 4, GFR 15-29 ml/min (HCC)   Overall stable, follows with [facility]- Dr Tobie. Continue lisinopril .        Other   Hyperlipidemia   Has follow up with vascular surgery given his PAD, will aim for LDL <55.  Limited in ability to uptitrate crestor  with CKD, WIll change to Atorvastatin  80mg  daily. Rehcek lipid panel in 6 weeks, consider adding zetia if not getting to goal.      Relevant Medications   atorvastatin  (LIPITOR) 80 MG tablet    Return in about 6 weeks (around 11/18/2023) for cholesterol check.    [place] JAYSON Eastern, DO

## 2023-10-07 NOTE — Assessment & Plan Note (Signed)
 BP at goal, remains off Hydralazine .

## 2023-10-07 NOTE — Assessment & Plan Note (Addendum)
 Overall stable, follows with [facility]- Dr Tobie. Continue lisinopril .

## 2023-10-08 ENCOUNTER — Ambulatory Visit: Payer: Medicare Other | Admitting: Radiology

## 2023-10-14 ENCOUNTER — Telehealth: Payer: Self-pay | Admitting: *Deleted

## 2023-10-14 NOTE — Telephone Encounter (Signed)
 Called patient to inform of CT for 10-24-23- arrival time- 11:45 am @ [facility], no restrictions to scan, patient to receive results from [facility] on 10/29/23 @ 10 am, lvm for a return call

## 2023-10-17 DIAGNOSIS — N184 Chronic kidney disease, stage 4 (severe): Secondary | ICD-10-CM | POA: Diagnosis not present

## 2023-10-21 DIAGNOSIS — I129 Hypertensive chronic kidney disease with stage 1 through stage 4 chronic kidney disease, or unspecified chronic kidney disease: Secondary | ICD-10-CM | POA: Diagnosis not present

## 2023-10-21 DIAGNOSIS — D631 Anemia in chronic kidney disease: Secondary | ICD-10-CM | POA: Diagnosis not present

## 2023-10-21 DIAGNOSIS — N184 Chronic kidney disease, stage 4 (severe): Secondary | ICD-10-CM | POA: Diagnosis not present

## 2023-10-21 DIAGNOSIS — E559 Vitamin D deficiency, unspecified: Secondary | ICD-10-CM | POA: Diagnosis not present

## 2023-10-24 ENCOUNTER — Ambulatory Visit (HOSPITAL_COMMUNITY)
Admission: RE | Admit: 2023-10-24 | Discharge: 2023-10-24 | Disposition: A | Discharge status: Home / Self Care | Source: Ambulatory Visit | Attending: Radiology | Admitting: Radiology

## 2023-10-24 DIAGNOSIS — Z87891 Personal history of nicotine dependence: Secondary | ICD-10-CM | POA: Insufficient documentation

## 2023-10-24 DIAGNOSIS — I700 Atherosclerosis of aorta: Secondary | ICD-10-CM | POA: Insufficient documentation

## 2023-10-24 DIAGNOSIS — Z122 Encounter for screening for malignant neoplasm of respiratory organs: Secondary | ICD-10-CM | POA: Diagnosis not present

## 2023-10-24 DIAGNOSIS — C320 Malignant neoplasm of glottis: Secondary | ICD-10-CM | POA: Diagnosis not present

## 2023-10-29 ENCOUNTER — Ambulatory Visit: Admitting: Radiology

## 2023-11-05 ENCOUNTER — Ambulatory Visit
Admission: RE | Admit: 2023-11-05 | Discharge: 2023-11-05 | Disposition: A | Discharge status: Home / Self Care | Source: Ambulatory Visit | Attending: Radiology | Admitting: Radiology

## 2023-11-05 VITALS — BP 128/71 | HR 66 | Temp 97.7°F | Resp 20 | Ht 71.0 in | Wt 134.6 lb

## 2023-11-05 DIAGNOSIS — I2510 Atherosclerotic heart disease of native coronary artery without angina pectoris: Secondary | ICD-10-CM | POA: Diagnosis not present

## 2023-11-05 DIAGNOSIS — I700 Atherosclerosis of aorta: Secondary | ICD-10-CM | POA: Insufficient documentation

## 2023-11-05 DIAGNOSIS — Z7982 Long term (current) use of aspirin: Secondary | ICD-10-CM | POA: Diagnosis not present

## 2023-11-05 DIAGNOSIS — Z87891 Personal history of nicotine dependence: Secondary | ICD-10-CM | POA: Diagnosis not present

## 2023-11-05 DIAGNOSIS — M47814 Spondylosis without myelopathy or radiculopathy, thoracic region: Secondary | ICD-10-CM | POA: Insufficient documentation

## 2023-11-05 DIAGNOSIS — Z79899 Other long term (current) drug therapy: Secondary | ICD-10-CM | POA: Insufficient documentation

## 2023-11-05 DIAGNOSIS — Z923 Personal history of irradiation: Secondary | ICD-10-CM | POA: Diagnosis not present

## 2023-11-05 DIAGNOSIS — R911 Solitary pulmonary nodule: Secondary | ICD-10-CM | POA: Insufficient documentation

## 2023-11-05 DIAGNOSIS — E042 Nontoxic multinodular goiter: Secondary | ICD-10-CM | POA: Insufficient documentation

## 2023-11-05 DIAGNOSIS — C320 Malignant neoplasm of glottis: Secondary | ICD-10-CM | POA: Insufficient documentation

## 2023-11-05 NOTE — Progress Notes (Signed)
 Radiation Oncology         [phone]) [phone] ________________________________  Name: William Perkins MRN: [id number]  Date: 11/05/2023  DOB: [birth year]  Follow-Up Visit Note  CC: Rosan Dayton JAYSON, DO  Tobie Eldora NOVAK, MD  Diagnosis and Prior Radiotherapy:       ICD-10-CM   1. 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 He completed his treatment on 04/08/2023.  CT of the chest from 10/24/2023 demonstrates multiple lungs-RADS 2 nodules with the recommendation to continue with annual screening with low-dose chest CT without contrast in 12 months.   Pain issues, if any: Denies Using a feeding tube?: N/A Weight changes, if any:  Wt Readings from Last 3 Encounters:  11/05/23 134 lb 9.6 oz (61.1 kg)  10/07/23 135 lb (61.2 kg)  09/16/23 136 lb 12.8 oz (62.1 kg)  Swallowing issues, if any: Denies Smoking or chewing tobacco? Denies Using fluoride toothpaste daily? Denies Last ENT visit was on:  Other notable issues, if any:                     ALLERGIES:  has no known allergies.  Meds: Current Outpatient Medications  Medication Sig Dispense  Refill   acetaminophen  (TYLENOL ) 500 MG tablet Take 1,000 mg by mouth every 6 (six) hours as needed for mild pain or moderate pain.     amLODipine  (NORVASC ) 10 MG tablet Take 1 tablet (10 mg total) by mouth daily. 90 tablet 3   aspirin  EC 81 MG tablet Take 81 mg by mouth daily. Swallow whole.     atorvastatin  (LIPITOR) 80 MG tablet Take 1 tablet (80 mg total) by mouth daily. 90 tablet 3   cloNIDine  (CATAPRES ) 0.1 MG tablet Take 1 tablet (0.1 mg total) by mouth 2 (two) times daily. 180 tablet 3   lidocaine  (XYLOCAINE ) 2 % solution Patient: Mix 1part 2% viscous lidocaine , 1part H20. Swallow 10mL of diluted mixture, before meals and at bedtime, up to QID 200 mL 3   lisinopril  (ZESTRIL ) 40 MG tablet Take 1 tablet (40 mg total) by mouth daily. 90 tablet 3   oxyCODONE  (ROXICODONE ) 5 MG immediate release tablet Take 1 tablet (5 mg total) by mouth every 4 (four) hours as needed for severe pain (pain score 7-10). 45 tablet 0   polyethylene glycol (MIRALAX  / GLYCOLAX ) 17 g packet Take 17 g by mouth 2 (two) times daily. 14 each 25   No current facility-administered medications for this encounter.    Physical Findings: The patient is in no acute distress. Patient is alert and oriented. Wt Readings from Last 3 Encounters:  11/05/23 134 lb 9.6 oz (61.1 kg)  10/07/23 135 lb (61.2  kg)  09/16/23 136 lb 12.8 oz (62.1 kg)    height is 5' 11 (1.803 m) and weight is 134 lb 9.6 oz (61.1 kg). His temperature is 97.7 F (36.5 C). His blood pressure is 128/71 and his pulse is 66. His respiration is 20 and oxygen saturation is 100%. .  General: Alert and oriented, in no acute distress HEENT: Head is normocephalic. Extraocular movements are intact. Oropharynx is notable for no thrush or concerning lesions within the oral cavity. Exam limited by dentures. Patient is declined recommendation to take them out.  Neck: Neck is notable for no palpable adenopathy Skin: Skin in treatment fields shows satisfactory  healing within the treatment field.  Heart: Regular in rate and rhythm with no murmurs, rubs, or gallops. Chest: Clear to auscultation bilaterally, with no rhonchi, wheezes, or rales. Abdomen: Soft, nontender, nondistended, with no rigidity or guarding. Extremities: No cyanosis or edema. Lymphatics: see Neck Exam Psychiatric: Judgment and insight are intact. Affect is appropriate.   Lab Findings: Lab Results  Component Value Date   WBC 6.2 01/24/2023   HGB 11.2 (L) 01/24/2023   HCT 34.9 (L) 01/24/2023   MCV 90.4 01/24/2023   PLT 244 01/24/2023    Lab Results  Component Value Date   TSH 0.870 02/15/2023    Radiographic Findings: CT CHEST LUNG CA SCREEN LOW DOSE W/O CM Result Date: 11/05/2023 CLINICAL DATA:  73 year old male former smoker with 45 pack-year smoking history. Baseline cancer screening. EXAM: CT CHEST WITHOUT CONTRAST LOW-DOSE FOR LUNG CANCER SCREENING TECHNIQUE: Multidetector CT imaging of the chest was performed following the standard protocol without IV contrast. RADIATION DOSE REDUCTION: This exam was performed according to the departmental dose-optimization program which includes automated exposure control, adjustment of the mA and/or kV according to patient size and/or use of iterative reconstruction technique. COMPARISON:  None Available. FINDINGS: Cardiovascular: Normal heart size. No significant pericardial fluid/thickening. Great vessels are normal in course and caliber. Coronary artery calcifications and aortic atherosclerosis. Mediastinum/Nodes: Multinodular goiter, better evaluated on prior ultrasound examination. Normal esophagus. No pathologically enlarged axillary, supraclavicular, mediastinal, or hilar lymph nodes. Lungs/Pleura: The central airways are patent. Ground-glass left lower lobe nodule measures 3.0 mm (2:41) and a linear 1.1 mm peribronchovascular nodule in the peripheral right lower lobe (2:216). No focal consolidation. No pneumothorax. No pleural  effusion. Upper abdomen: Large volume stool within the partially imaged colon. Musculoskeletal: No acute or abnormal lytic or blastic osseous lesions. Multilevel degenerative changes of the thoracic spine. IMPRESSION: 1. Lung-RADS 2, benign appearance or behavior. Continue annual screening with low-dose chest CT without contrast in 12 months. 2. Aortic Atherosclerosis (ICD10-I70.0). Coronary artery calcifications. Assessment for potential risk factor modification, dietary therapy or pharmacologic therapy may be warranted, if clinically indicated. Electronically Signed   By: [name]   On: 11/05/2023 10:14    Impression/Plan:    1) Head and Neck Cancer Status: Patient is continuing to recover from the effects of radiation and is doing well overall today.   2) Nutritional Status: Stable, eating and drinking everything as normal.  Wt Readings from Last 3 Encounters:  11/05/23 134 lb 9.6 oz (61.1 kg)  10/07/23 135 lb (61.2 kg)  09/16/23 136 lb 12.8 oz (62.1 kg)  PEG tube: N/A  3) Risk Factors: The patient has been educated about risk factors including alcohol and tobacco abuse; they understand that avoidance of alcohol and tobacco is important to prevent recurrences as well as other cancers. He is not smoking.   4) Swallowing:  Patient saw SLP on 04/16/2023 and was given take-home exercises to complete.   5) Dental: Encouraged to continue regular followup with dentistry, and dental hygiene including fluoride rinses. Patient has dentures.   6) Thyroid  function: Will re-check at next visit in 1 week.  Lab Results  Component Value Date   TSH 0.870 02/15/2023    7) Lung Cancer risk: Patient meets guidelines for lung cancer screening given his tobacco use history. Repeat CT of the chest ordered in 12 months.   8) Follow-up: Recommended laryngoscope today. Patient is unwilling to go through with scope today since he did not know it was scheduled. He requested to come in at a later date to have  this completed. He has been rescheduled to come in approximately one week from now to have this done.   Patient will follow-up with Dr. Tobie for scope in 3 months.  The patient was encouraged to call with any issues or questions before then.   On date of service, in total, I spent 20 minutes on this encounter. Patient was seen in person. _____________________________________    Leeroy Due, PA-C

## 2023-11-05 NOTE — Progress Notes (Signed)
 Patient identity verified x2     Treatment Completion Date:  Pain issues, if any:  Using a feeding tube?: N/A Weight changes, if any:  Swallowing issues, if any: Denies Smoking or chewing tobacco? Denies Using fluoride toothpaste daily? Denies Last ENT visit was on:  Other notable issues, if any:  BP 128/71 (BP Location: Left Arm, Patient Position: Sitting, Cuff Size: Large)   Pulse 66   Temp 97.7 F (36.5 C)   Resp 20   Ht 5' 11 (1.803 m)   Wt 134 lb 9.6 oz (61.1 kg)   SpO2 100%   BMI 18.77 kg/m

## 2023-11-11 NOTE — Progress Notes (Incomplete)
 Radiation Oncology         (336) [phone] ________________________________  Name: SARAH ZERBY MRN: [id number]  Date: 11/12/2023  DOB: 16-Nov-1950  Follow-Up Visit Note  CC: Rosan Dayton JAYSON, DO  Rosan Dayton JAYSON, DO  Diagnosis and Prior Radiotherapy:     No diagnosis found.   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 He was unwilling to get a scope at his scheduled appointment last week since he was not prepared to do this.   ***                     ALLERGIES:  has no known allergies.  Meds: Current Outpatient Medications  Medication Sig Dispense Refill   acetaminophen  (TYLENOL ) 500 MG tablet Take 1,000 mg by mouth every 6 (six) hours as needed for mild pain or moderate pain.     amLODipine  (NORVASC ) 10 MG tablet Take 1 tablet (10 mg total) by mouth daily. 90 tablet 3   aspirin  EC 81 MG tablet Take 81 mg by mouth daily. Swallow whole.     atorvastatin  (LIPITOR) 80 MG tablet Take 1 tablet (80 mg total) by mouth daily. 90 tablet 3   cloNIDine  (CATAPRES ) 0.1 MG tablet Take 1 tablet (0.1 mg total) by mouth 2 (two) times daily. 180 tablet 3   lidocaine  (XYLOCAINE ) 2 % solution Patient: Mix 1part 2% viscous lidocaine ,  1part H20. Swallow 10mL of diluted mixture, before meals and at bedtime, up to QID 200 mL 3   lisinopril  (ZESTRIL ) 40 MG tablet Take 1 tablet (40 mg total) by mouth daily. 90 tablet 3   oxyCODONE  (ROXICODONE ) 5 MG immediate release tablet Take 1 tablet (5 mg total) by mouth every 4 (four) hours as needed for severe pain (pain score 7-10). 45 tablet 0   polyethylene glycol (MIRALAX  / GLYCOLAX ) 17 g packet Take 17 g by mouth 2 (two) times daily. 14 each 25   No current facility-administered medications for this visit.    Physical Findings: *** The patient is in no acute distress. Patient is alert and oriented. Wt Readings from Last 3 Encounters:  11/05/23 134 lb 9.6 oz (61.1 kg)  10/07/23 135 lb (61.2 kg)  09/16/23 136 lb 12.8 oz (62.1 kg)    vitals were not taken for this visit. .  General: Alert and oriented, in no acute distress HEENT: Head is normocephalic. Extraocular movements are intact. Oropharynx is notable for no thrush or concerning lesions within the oral cavity. Exam limited by dentures. Patient is declined recommendation to take them out.  Neck: Neck is notable for no palpable adenopathy Skin: Skin in treatment fields shows satisfactory healing within the treatment field.  Heart: Regular in rate and rhythm with no murmurs, rubs, or gallops. Chest: Clear to auscultation bilaterally, with no rhonchi, wheezes,  or rales. Abdomen: Soft, nontender, nondistended, with no rigidity or guarding. Extremities: No cyanosis or edema. Lymphatics: see Neck Exam Psychiatric: Judgment and insight are intact. Affect is appropriate.  LARYNGOSCOPY PROCEDURE NOTE: After obtaining consent and spraying nasal cavity with topical lidocaine  and oxymetazoline , the flexible endoscope was coated with lidocaine  gel and introduced and passed through the nasal cavity.  The nasopharynx, oropharynx, hypopharynx, and larynx were then examined. No lesions appreciated in the mucosal axis. *** The true cords  were symmetrically mobile. *** The patient tolerated the procedure well.   Lab Findings: Lab Results  Component Value Date   WBC 6.2 01/24/2023   HGB 11.2 (L) 01/24/2023   HCT 34.9 (L) 01/24/2023   MCV 90.4 01/24/2023   PLT 244 01/24/2023    Lab Results  Component Value Date   TSH 0.870 02/15/2023    Radiographic Findings: CT CHEST LUNG CA SCREEN LOW DOSE W/O CM Result Date: 11/05/2023 CLINICAL DATA:  73 year old male former smoker with 45 pack-year smoking history. Baseline cancer screening. EXAM: CT CHEST WITHOUT CONTRAST LOW-DOSE FOR LUNG CANCER SCREENING TECHNIQUE: Multidetector CT imaging of the chest was performed following the standard protocol without IV contrast. RADIATION DOSE REDUCTION: This exam was performed according to the departmental dose-optimization program which includes automated exposure control, adjustment of the mA and/or kV according to patient size and/or use of iterative reconstruction technique. COMPARISON:  None Available. FINDINGS: Cardiovascular: Normal heart size. No significant pericardial fluid/thickening. Great vessels are normal in course and caliber. Coronary artery calcifications and aortic atherosclerosis. Mediastinum/Nodes: Multinodular goiter, better evaluated on prior ultrasound examination. Normal esophagus. No pathologically enlarged axillary, supraclavicular, mediastinal, or hilar lymph nodes. Lungs/Pleura: The central airways are patent. Ground-glass left lower lobe nodule measures 3.0 mm (2:41) and a linear 1.1 mm peribronchovascular nodule in the peripheral right lower lobe (2:216). No focal consolidation. No pneumothorax. No pleural effusion. Upper abdomen: Large volume stool within the partially imaged colon. Musculoskeletal: No acute or abnormal lytic or blastic osseous lesions. Multilevel degenerative changes of the thoracic spine. IMPRESSION: 1. Lung-RADS 2, benign appearance or behavior. Continue annual screening with low-dose chest CT without  contrast in 12 months. 2. Aortic Atherosclerosis (ICD10-I70.0). Coronary artery calcifications. Assessment for potential risk factor modification, dietary therapy or pharmacologic therapy may be warranted, if clinically indicated. Electronically Signed   By: [name]   On: 11/05/2023 10:14    Impression/Plan:    1) Head and Neck Cancer Status: Patient is continuing to recover from the effects of radiation and is doing well overall today. ***  2) Nutritional Status: Stable, eating and drinking everything as normal. *** Wt Readings from Last 3 Encounters:  11/05/23 134 lb 9.6 oz (61.1 kg)  10/07/23 135 lb (61.2 kg)  09/16/23 136 lb 12.8 oz (62.1 kg)  PEG tube: N/A  3) Risk Factors: The patient has been educated about risk factors including alcohol and tobacco abuse; they understand that avoidance of alcohol and tobacco is important to prevent recurrences as well as other cancers. He is not smoking.   4) Swallowing: Patient saw SLP on 04/16/2023 and was given take-home exercises to complete. ***  5) Dental: Encouraged to continue regular followup with dentistry, and dental hygiene including fluoride rinses. Patient has dentures. ***  6) Thyroid  function: Will re-check at next visit in 1 week.  *** Lab Results  Component Value Date   TSH 0.870 02/15/2023    7) Lung Cancer risk: Patient meets guidelines for lung cancer screening given his tobacco use  history. Repeat CT of the chest ordered in 12 months.  ***  8) Follow-up: Recommended laryngoscope today. Patient is unwilling to go through with scope today since he did not know it was scheduled. He requested to come in at a later date to have this completed. He has been rescheduled to come in approximately one week from now to have this done.  ***  Patient will follow-up with Dr. Tobie for scope in 3 months.  The patient was encouraged to call with any issues or questions before then.  ***  On date of service, in total, I spent 20  minutes on this encounter. Patient was seen in person. _____________________________________    Leeroy Due, PA-C

## 2023-11-12 ENCOUNTER — Telehealth: Payer: Self-pay | Admitting: Radiation Oncology

## 2023-11-12 ENCOUNTER — Ambulatory Visit: Admitting: Radiology

## 2023-11-12 NOTE — Telephone Encounter (Signed)
 Called pt's wife to r/s. She was agreeable to 9/16@10am .

## 2023-11-12 NOTE — Progress Notes (Incomplete)
 Patient identity verified x2  ***Patient  is here for a follow-up appointment today- *** for: ***  Treatment Completion Date: *** Pain issues, if any: *** Using a feeding tube?: N/A Weight changes, if any:  Wt Readings from Last 3 Encounters:  11/05/23 134 lb 9.6 oz (61.1 kg)  10/07/23 135 lb (61.2 kg)  09/16/23 136 lb 12.8 oz (62.1 kg)   Swallowing issues, if any: *** Smoking or chewing tobacco? Denies Using fluoride toothpaste daily? Denies Last ENT visit was on: *** Other notable issues, if any: ***

## 2023-11-13 ENCOUNTER — Encounter

## 2023-11-18 ENCOUNTER — Telehealth: Payer: Self-pay | Admitting: *Deleted

## 2023-11-18 NOTE — Progress Notes (Incomplete)
 Radiation Oncology         [phone] ________________________________  Name: William Perkins MRN: [id number]  Date: 11/19/2023  DOB: 29-Dec-1950  Follow-Up Visit Note  CC: Rosan Dayton JAYSON, DO  Rosan Dayton JAYSON, DO  Diagnosis and Prior Radiotherapy:     No diagnosis found.   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 He was unwilling to get a scope at his scheduled appointment last week since he was not prepared to do this.   ***                     ALLERGIES:  has no known allergies.  Meds: Current Outpatient Medications  Medication Sig Dispense Refill   acetaminophen  (TYLENOL ) 500 MG tablet Take 1,000 mg by mouth every 6 (six) hours as needed for mild pain or moderate pain.     amLODipine  (NORVASC ) 10 MG tablet Take 1 tablet (10 mg total) by mouth daily. 90 tablet 3   aspirin  EC 81 MG tablet Take 81 mg by mouth daily. Swallow whole.     atorvastatin  (LIPITOR) 80 MG tablet Take 1 tablet (80 mg total) by mouth daily. 90 tablet 3   cloNIDine  (CATAPRES ) 0.1 MG tablet Take 1 tablet (0.1 mg total) by mouth 2 (two) times daily. 180 tablet 3   lidocaine  (XYLOCAINE ) 2 % solution Patient: Mix 1part 2% viscous lidocaine ,  1part H20. Swallow 10mL of diluted mixture, before meals and at bedtime, up to QID 200 mL 3   lisinopril  (ZESTRIL ) 40 MG tablet Take 1 tablet (40 mg total) by mouth daily. 90 tablet 3   oxyCODONE  (ROXICODONE ) 5 MG immediate release tablet Take 1 tablet (5 mg total) by mouth every 4 (four) hours as needed for severe pain (pain score 7-10). 45 tablet 0   polyethylene glycol (MIRALAX  / GLYCOLAX ) 17 g packet Take 17 g by mouth 2 (two) times daily. 14 each 25   No current facility-administered medications for this visit.    Physical Findings: *** The patient is in no acute distress. Patient is alert and oriented. Wt Readings from Last 3 Encounters:  11/05/23 134 lb 9.6 oz (61.1 kg)  10/07/23 135 lb (61.2 kg)  09/16/23 136 lb 12.8 oz (62.1 kg)    vitals were not taken for this visit. .  General: Alert and oriented, in no acute distress HEENT: Head is normocephalic. Extraocular movements are intact. Oropharynx is notable for no thrush or concerning lesions within the oral cavity. Exam limited by dentures. Patient is declined recommendation to take them out.  Neck: Neck is notable for no palpable adenopathy Skin: Skin in treatment fields shows satisfactory healing within the treatment field.  Heart: Regular in rate and rhythm with no murmurs, rubs, or gallops. Chest: Clear to auscultation bilaterally, with no rhonchi, wheezes,  or rales. Abdomen: Soft, nontender, nondistended, with no rigidity or guarding. Extremities: No cyanosis or edema. Lymphatics: see Neck Exam Psychiatric: Judgment and insight are intact. Affect is appropriate.  LARYNGOSCOPY PROCEDURE NOTE: After obtaining consent and spraying nasal cavity with topical lidocaine  and oxymetazoline , the flexible endoscope was coated with lidocaine  gel and introduced and passed through the nasal cavity.  The nasopharynx, oropharynx, hypopharynx, and larynx were then examined. No lesions appreciated in the mucosal axis. *** The true cords  were symmetrically mobile. *** The patient tolerated the procedure well.   Lab Findings: Lab Results  Component Value Date   WBC 6.2 01/24/2023   HGB 11.2 (L) 01/24/2023   HCT 34.9 (L) 01/24/2023   MCV 90.4 01/24/2023   PLT 244 01/24/2023    Lab Results  Component Value Date   TSH 0.870 02/15/2023    Radiographic Findings: CT CHEST LUNG CA SCREEN LOW DOSE W/O CM Result Date: 11/05/2023 CLINICAL DATA:  73 year old male former smoker with 45 pack-year smoking history. Baseline cancer screening. EXAM: CT CHEST WITHOUT CONTRAST LOW-DOSE FOR LUNG CANCER SCREENING TECHNIQUE: Multidetector CT imaging of the chest was performed following the standard protocol without IV contrast. RADIATION DOSE REDUCTION: This exam was performed according to the departmental dose-optimization program which includes automated exposure control, adjustment of the mA and/or kV according to patient size and/or use of iterative reconstruction technique. COMPARISON:  None Available. FINDINGS: Cardiovascular: Normal heart size. No significant pericardial fluid/thickening. Great vessels are normal in course and caliber. Coronary artery calcifications and aortic atherosclerosis. Mediastinum/Nodes: Multinodular goiter, better evaluated on prior ultrasound examination. Normal esophagus. No pathologically enlarged axillary, supraclavicular, mediastinal, or hilar lymph nodes. Lungs/Pleura: The central airways are patent. Ground-glass left lower lobe nodule measures 3.0 mm (2:41) and a linear 1.1 mm peribronchovascular nodule in the peripheral right lower lobe (2:216). No focal consolidation. No pneumothorax. No pleural effusion. Upper abdomen: Large volume stool within the partially imaged colon. Musculoskeletal: No acute or abnormal lytic or blastic osseous lesions. Multilevel degenerative changes of the thoracic spine. IMPRESSION: 1. Lung-RADS 2, benign appearance or behavior. Continue annual screening with low-dose chest CT without  contrast in 12 months. 2. Aortic Atherosclerosis (ICD10-I70.0). Coronary artery calcifications. Assessment for potential risk factor modification, dietary therapy or pharmacologic therapy may be warranted, if clinically indicated. Electronically Signed   By: [name]   On: 11/05/2023 10:14    Impression/Plan:    1) Head and Neck Cancer Status: Patient is continuing to recover from the effects of radiation and is doing well overall today. ***  2) Nutritional Status: Stable, eating and drinking everything as normal. *** Wt Readings from Last 3 Encounters:  11/05/23 134 lb 9.6 oz (61.1 kg)  10/07/23 135 lb (61.2 kg)  09/16/23 136 lb 12.8 oz (62.1 kg)  PEG tube: N/A  3) Risk Factors: The patient has been educated about risk factors including alcohol and tobacco abuse; they understand that avoidance of alcohol and tobacco is important to prevent recurrences as well as other cancers. He is not smoking.   4) Swallowing: Patient saw SLP on 04/16/2023 and was given take-home exercises to complete. ***  5) Dental: Encouraged to continue regular followup with dentistry, and dental hygiene including fluoride rinses. Patient has dentures. ***  6) Thyroid  function: Will re-check at next visit in 1 week.  *** Lab Results  Component Value Date   TSH 0.870 02/15/2023    7) Lung Cancer risk: Patient meets guidelines for lung cancer screening given his tobacco use  history. Repeat CT of the chest ordered in 12 months.  ***  8) Follow-up: Recommended laryngoscope today. Patient is unwilling to go through with scope today since he did not know it was scheduled. He requested to come in at a later date to have this completed. He has been rescheduled to come in approximately one week from now to have this done.  ***  Patient will follow-up with Dr. Tobie for scope in 3 months.  The patient was encouraged to call with any issues or questions before then.  ***  On date of service, in total, I spent 20  minutes on this encounter. Patient was seen in person. _____________________________________    Leeroy Due, PA-C

## 2023-11-19 ENCOUNTER — Other Ambulatory Visit: Payer: Self-pay

## 2023-11-19 ENCOUNTER — Ambulatory Visit
Admission: RE | Admit: 2023-11-19 | Discharge: 2023-11-19 | Disposition: A | Discharge status: Home / Self Care | Source: Ambulatory Visit | Attending: Radiology | Admitting: Radiology

## 2023-11-19 ENCOUNTER — Emergency Department (HOSPITAL_COMMUNITY)

## 2023-11-19 ENCOUNTER — Telehealth: Payer: Self-pay | Admitting: Radiation Oncology

## 2023-11-19 ENCOUNTER — Ambulatory Visit (INDEPENDENT_AMBULATORY_CARE_PROVIDER_SITE_OTHER): Admitting: Student

## 2023-11-19 ENCOUNTER — Encounter (HOSPITAL_COMMUNITY): Payer: Self-pay

## 2023-11-19 ENCOUNTER — Emergency Department (HOSPITAL_COMMUNITY)
Admission: EM | Admit: 2023-11-19 | Discharge: 2023-11-20 | Discharge status: Against Medical Advice | Attending: Emergency Medicine | Admitting: Emergency Medicine

## 2023-11-19 VITALS — BP 149/75 | HR 59 | Temp 98.2°F | Ht 71.0 in | Wt 135.2 lb

## 2023-11-19 DIAGNOSIS — L03032 Cellulitis of left toe: Secondary | ICD-10-CM | POA: Diagnosis not present

## 2023-11-19 DIAGNOSIS — Z5321 Procedure and treatment not carried out due to patient leaving prior to being seen by health care provider: Secondary | ICD-10-CM | POA: Insufficient documentation

## 2023-11-19 DIAGNOSIS — L97529 Non-pressure chronic ulcer of other part of left foot with unspecified severity: Secondary | ICD-10-CM | POA: Diagnosis not present

## 2023-11-19 DIAGNOSIS — M7989 Other specified soft tissue disorders: Secondary | ICD-10-CM | POA: Diagnosis not present

## 2023-11-19 DIAGNOSIS — I70222 Atherosclerosis of native arteries of extremities with rest pain, left leg: Secondary | ICD-10-CM | POA: Diagnosis not present

## 2023-11-19 DIAGNOSIS — M79672 Pain in left foot: Secondary | ICD-10-CM | POA: Insufficient documentation

## 2023-11-19 LAB — COMPREHENSIVE METABOLIC PANEL WITH GFR
ALT: 19 U/L (ref 0–44)
AST: 24 U/L (ref 15–41)
Albumin: 3.7 g/dL (ref 3.5–5.0)
Alkaline Phosphatase: 67 U/L (ref 38–126)
Anion gap: 12 (ref 5–15)
BUN: 34 mg/dL — ABNORMAL HIGH (ref 8–23)
CO2: 22 mmol/L (ref 22–32)
Calcium: 9.5 mg/dL (ref 8.9–10.3)
Chloride: 102 mmol/L (ref 98–111)
Creatinine, Ser: 2.46 mg/dL — ABNORMAL HIGH (ref 0.61–1.24)
GFR, Estimated: 27 mL/min — ABNORMAL LOW (ref >60)
Glucose, Bld: 109 mg/dL — ABNORMAL HIGH (ref 70–99)
Potassium: 4.4 mmol/L (ref 3.5–5.1)
Sodium: 136 mmol/L (ref 135–145)
Total Bilirubin: 0.8 mg/dL (ref 0.0–1.2)
Total Protein: 7.6 g/dL (ref 6.5–8.1)

## 2023-11-19 LAB — CBC WITH DIFFERENTIAL/PLATELET
Abs Immature Granulocytes: 0.03 K/uL (ref 0.00–0.07)
Basophils Absolute: 0.1 K/uL (ref 0.0–0.1)
Basophils Relative: 1 %
Eosinophils Absolute: 0.2 K/uL (ref 0.0–0.5)
Eosinophils Relative: 2 %
HCT: 34.9 % — ABNORMAL LOW (ref 39.0–52.0)
Hemoglobin: 11.1 g/dL — ABNORMAL LOW (ref 13.0–17.0)
Immature Granulocytes: 1 %
Lymphocytes Relative: 19 %
Lymphs Abs: 1.2 K/uL (ref 0.7–4.0)
MCH: 29.9 pg (ref 26.0–34.0)
MCHC: 31.8 g/dL (ref 30.0–36.0)
MCV: 94.1 fL (ref 80.0–100.0)
Monocytes Absolute: 0.6 K/uL (ref 0.1–1.0)
Monocytes Relative: 9 %
Neutro Abs: 4.2 K/uL (ref 1.7–7.7)
Neutrophils Relative %: 68 %
Platelets: 254 K/uL (ref 150–400)
RBC: 3.71 MIL/uL — ABNORMAL LOW (ref 4.22–5.81)
RDW: 13.3 % (ref 11.5–15.5)
WBC: 6.1 K/uL (ref 4.0–10.5)
nRBC: 0 % (ref 0.0–0.2)

## 2023-11-19 NOTE — Telephone Encounter (Signed)
 9/16 @ 10:52 am Left voicemail for patient to call our office to be r/s for his missed appt on today.

## 2023-11-19 NOTE — Assessment & Plan Note (Addendum)
 Patient presents with a 2-week history of left foot pain.  He stated that over the past 2 weeks his left foot pain has been gradually worsening and drastically worsened 2 days ago.  He describes it as a stabbing throbbing pain.  He reported that the pain is worse with walking and not better with rest.  He reports that he has tried Tylenol  and rest: Neither these have helped his pain.  He also reports changes in sensation of the left foot as well as his left foot being cold.  These changes started over the past 2 weeks as well.  On exam, his left foot is cold to touch extending from the distal one third of the tibia to the toes.  There are no palpable pulses present on his left foot.  I was unable to auscultate these pulses with Doppler.  He has severe pain with palpation of the left toes without capillary refill present on the left toes.  Change in sensation is present in the left foot.  There is decreased motor strength in the left toes, primarily toes 2 through 5.  High suspicion for critical limb ischemia.  Patient was notified of the emergent matter of his current condition.  Patient was instructed that he needs to be evaluated in the emergency department.  We notified patient and wife that it would be safer and preferred that the her transported to the emergency department via ambulance, patient and wife prefer to drive themselves to the emergency department.  Plan: -ASA 81 mg, Lipitor 80 mg - Patient will need CT angiogram and emergent vascular surgery consultation

## 2023-11-19 NOTE — ED Provider Triage Note (Signed)
 Emergency Medicine Provider Triage Evaluation Note  EUGUNE SINE , a 73 y.o. male  was evaluated in triage.  Pt complains of left foot pain and swelling for several days no fever..  I cannot palpate dorsal pedal pulse however they were found on Doppler.  Review of Systems  Positive: Foot pain Negative: Fever  Physical Exam  BP 114/79   Pulse 60   Temp 98.1 F (36.7 C)   Resp 16   SpO2 100%  Gen:   Awake, no distress   Resp:  Normal effort  MSK:   Moves extremities without difficulty  Other:  Left foot red, limb is warm, good cap refill  Medical Decision Making  Medically screening exam initiated at 4:07 PM.  Appropriate orders placed.  Elston JAYSON Griffiths was informed that the remainder of the evaluation will be completed by another provider, this initial triage assessment does not replace that evaluation, and the importance of remaining in the ED until their evaluation is complete.     Shermon Warren SAILOR, PA-C 11/19/23 [id number]

## 2023-11-19 NOTE — Progress Notes (Signed)
 Established Patient Office Visit  Subjective   Patient ID: William Perkins, male    DOB: 07/14/1950  Age: 73 y.o. MRN: [id number]  Chief Complaint  Patient presents with   Foot Pain    LEFT FOOT PAIN FOR ABOUT 2 WEEKS WITH REDNESS -SWELLING AND PAIN    William Perkins is a 73 y.o. with a past medical history of PAD and history of status post extensive right femoral endarterectomy for rest pain who presents to the clinic for left foot pain.   Patient presents with a 2-week history of left foot pain.  He stated that over the past 2 weeks his left foot pain has been gradually worsening and drastically worsened 2 days ago.  He describes it as a stabbing throbbing pain.  He reported that the pain is worse with walking and not better with rest.  He reports that he has tried Tylenol  and rest: Neither these have helped his pain.  He also reports changes in sensation of the left foot as well as his left foot being cold.  These changes started over the past 2 weeks as well.  Patient Active Problem List   Diagnosis Date Noted   Critical limb ischemia of left lower extremity (HCC) 11/19/2023   Multinodular goiter 09/17/2023   Internal carotid artery stenosis, right 06/13/2023   Systolic murmur 06/13/2023   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   PAD (peripheral artery disease) (HCC) 11/16/2019   History of colonic polyps 01/28/2018   Anemia 12/12/2017   Degenerative spondylolisthesis 12/12/2017   Vitamin D  deficiency 08/03/2016   Cervical disc disease 07/03/2016   Type 2 diabetes mellitus with stage 4 chronic kidney disease, without long-term current use of insulin (HCC) 02/10/2015   Other emphysema (HCC) 05/22/2012   Constipation 05/22/2012   Healthcare maintenance 03/28/2012   Hyperlipidemia 03/22/2006   Essential hypertension 03/22/2006   CKD (chronic kidney disease) stage 4, GFR 15-29 ml/min (HCC) 03/21/2006       Objective:     BP (!) 149/75 (BP Location: Left Arm, Patient Position:  Sitting, Cuff Size: Normal)   Pulse (!) 59   Temp 98.2 F (36.8 C) (Oral)   Ht 5' 11 (1.803 m)   Wt 135 lb 3.2 oz (61.3 kg)   SpO2 96%   BMI 18.86 kg/m  BP Readings from Last 3 Encounters:  11/19/23 (!) 149/75  11/05/23 128/71  10/07/23 105/82   Wt Readings from Last 3 Encounters:  11/19/23 135 lb 3.2 oz (61.3 kg)  11/05/23 134 lb 9.6 oz (61.1 kg)  10/07/23 135 lb (61.2 kg)      Physical Exam Vitals reviewed.  Constitutional:      General: He is not in acute distress.    Appearance: He is not ill-appearing, toxic-appearing or diaphoretic.  Cardiovascular:     Rate and Rhythm: Normal rate and regular rhythm.     Heart sounds: Murmur heard.  Pulmonary:     Effort: Pulmonary effort is normal. No respiratory distress.     Breath sounds: Normal breath sounds. No wheezing or rales.  Musculoskeletal:     Comments: Left foot is cold from the distal one third of the tibia to the toes. NO palpable pulses present. Unable to auscultate pulses with doppler.  Severe pain with palpation of left toes. NO capillary refill present on left toes. Change in sensation of left foot. Decreased motor strength in the left toes (from toes 2-5).   Skin:    General: Skin is dry.  Neurological:     Mental Status: He is alert.       Last metabolic panel Lab Results  Component Value Date   GLUCOSE 248 (H) 09/16/2023   NA 135 09/16/2023   K 4.4 09/16/2023   CL 99 09/16/2023   CO2 18 (L) 09/16/2023   BUN 40 (H) 09/16/2023   CREATININE 2.58 (H) 09/16/2023   EGFR 25 (L) 09/16/2023   CALCIUM  9.9 09/16/2023   PROT 6.5 05/20/2020   ALBUMIN 3.6 05/20/2020   LABGLOB 2.8 01/20/2015   AGRATIO 1.5 01/20/2015   BILITOT 0.6 05/20/2020   ALKPHOS 44 05/20/2020   AST 18 05/20/2020   ALT 17 05/20/2020   ANIONGAP 13 01/24/2023   Last lipids Lab Results  Component Value Date   CHOL 157 09/16/2023   HDL 53 09/16/2023   LDLCALC 84 09/16/2023   TRIG 110 09/16/2023   CHOLHDL 3.0 09/16/2023    Last hemoglobin A1c Lab Results  Component Value Date   HGBA1C 6.9 (A) 09/16/2023      The 10-year ASCVD risk score (Arnett DK, et al., 2019) is: 41.9%    Assessment & Plan:   Problem List Items Addressed This Visit       Cardiovascular and Mediastinum   Critical limb ischemia of left lower extremity (HCC) - Primary   Patient presents with a 2-week history of left foot pain.  He stated that over the past 2 weeks his left foot pain has been gradually worsening and drastically worsened 2 days ago.  He describes it as a stabbing throbbing pain.  He reported that the pain is worse with walking and not better with rest.  He reports that he has tried Tylenol  and rest: Neither these have helped his pain.  He also reports changes in sensation of the left foot as well as his left foot being cold.  These changes started over the past 2 weeks as well.  On exam, his left foot is cold to touch extending from the distal one third of the tibia to the toes.  There are no palpable pulses present on his left foot.  I was unable to auscultate these pulses with Doppler.  He has severe pain with palpation of the left toes without capillary refill present on the left toes.  Change in sensation is present in the left foot.  There is decreased motor strength in the left toes, primarily toes 2 through 5.  High suspicion for critical limb ischemia.  Patient was notified of the emergent matter of his current condition.  Patient was instructed that he needs to be evaluated in the emergency department.  We notified patient and wife that it would be safer and preferred that the her transported to the emergency department via ambulance, patient and wife prefer to drive themselves to the emergency department.  Plan: -ASA 81 mg, Lipitor 80 mg - Patient will need CT angiogram and emergent vascular surgery consultation      Patient seen with Dr. Lovie Damien Lease, DO

## 2023-11-19 NOTE — Patient Instructions (Signed)
 Thank you, [name] for allowing us  to provide your care today. Today we discussed your left foot pain. I am concerned that your foot does not have enough blood flow and this needs addressed emergently! Please go to the emergency department to be evaluated. .     Should you have any questions or concerns please call the internal medicine clinic at [phone].     Please note that our late policy has changed.  If you are more than 15 minutes late to your appointment, you may be asked to reschedule your appointment.  Dr. Kandis, D.O. [facility]

## 2023-11-19 NOTE — ED Triage Notes (Signed)
 Pt states that he was seen for left lower extremity pain and was sent here for concern of circulatory issues

## 2023-11-19 NOTE — ED Triage Notes (Signed)
 Pt came in via POV d/t Lt foot pain for the past 2 weeks, redness noted during triage pt reports he noted started a few days ago. No pulse palpated in that foot but can be heard with the doppler. A/Ox4, rates his pain 8/10 during triage.

## 2023-11-20 ENCOUNTER — Ambulatory Visit (INDEPENDENT_AMBULATORY_CARE_PROVIDER_SITE_OTHER)

## 2023-11-20 ENCOUNTER — Other Ambulatory Visit: Payer: Self-pay

## 2023-11-20 ENCOUNTER — Inpatient Hospital Stay: Payer: Self-pay

## 2023-11-20 VITALS — BP 102/76 | HR 94 | Temp 97.7°F | Ht 71.0 in | Wt 133.6 lb

## 2023-11-20 DIAGNOSIS — M79605 Pain in left leg: Secondary | ICD-10-CM

## 2023-11-20 DIAGNOSIS — I739 Peripheral vascular disease, unspecified: Secondary | ICD-10-CM

## 2023-11-20 NOTE — Progress Notes (Signed)
 Internal Medicine Clinic Attending  I was physically present during the key portions of the resident provided service and participated in the medical decision making of patient's management care. I reviewed pertinent patient test results.  The assessment, diagnosis, and plan were formulated together and I agree with the documentation in the resident's note.  William Clarity, MD    73yo man with known PAD who presented with 2 weeks of progressive, throbbing pain in his left foot, getting more severe over the past 2 days.  On my exam, his left foot is cold. I cannot palpate his DP pulses on the left side.   I am worried about critical limb ischemia. I agree with Dr Oscar assessment and plan that patient needs to go to the Emergency Room for further evaluation. I explained that I suspect this will include a Vascular Surgery consult, imaging, and possibly a procedure. Patient's wife understands the risks of declining ambulance and will drive him to the [name] ER now.

## 2023-11-20 NOTE — ED Notes (Signed)
 Patient's wife stated that they did not want to wait any longer, and they will follow up with primary care tomorrow. Patient's wife wanted to speak with charge nurse, and I did inform charge however she was busy with EMS at the time. Patient and wife once again did not want to wait.

## 2023-11-21 NOTE — Progress Notes (Signed)
 Opened in error

## 2023-11-22 ENCOUNTER — Telehealth: Payer: Self-pay | Admitting: *Deleted

## 2023-11-22 NOTE — Telephone Encounter (Signed)
 Please advise if this order needs to be changed as the patient has been calling to be sched  Copied from CRM [id number]. Topic: Clinical - Request for Lab/Test Order >> Nov 22, 2023 11:07 AM Alfonso ORN wrote: Reason for CRM: pt. want to confirm when his MRI for his foot, is scheduled  appt, Contacted CAL and that once check with insurance will let patient know  Please contact patient to let know    Call to patient-states he was calling to check on the status of his MR ANGIO LOWER EXTREMITY LEFT W WO CONTRAST  States someone had call him to ask him questions. Pt states he informed this person, that he was unable to have contrast due to his kidney disease. States he was told someone will call him back.

## 2023-11-22 NOTE — Telephone Encounter (Signed)
 Copied from CRM [id number]. Topic: Clinical - Request for Lab/Test Order >> Nov 22, 2023 11:07 AM Alfonso ORN wrote: Reason for CRM: pt. want to confirm when his MRI for his foot, is scheduled  appt, Contacted CAL and that once check with insurance will let patient know  Please contact patient to let know   Call to patient-states he was calling to check on the status of his MR ANGIO LOWER EXTREMITY LEFT W WO CONTRAST  States someone had call him to ask him questions. Pt states he informed this person, that he was unable to have contrast due to his kidney disease. States he was told someone will call him back.

## 2023-11-25 NOTE — H&P (View-Only) (Signed)
 [facility] [place]  ASSESSMENT / PLAN: William Perkins is a 73 y.o. male with atherosclerosis of native arteries of left causing ischemic rest pain.  He is status post right extended femoral endarterectomy in 2022.  He has no residual right lower extremity symptoms.  He has asymptomatic right subclavian artery stenosis.  Recommend:  Abstinence from all tobacco products. Blood glucose control with goal A1c < 7%. Blood pressure control with goal blood pressure < 130/80 mmHg. Lipid reduction therapy with goal LDL-C < 55 mg/dL. Aspirin  81mg  by mouth daily. Atorvastatin  40-80mg  PO QD (or other high intensity statin therapy).  Plan left lower extremity angiogram with possible intervention via right common femoral approach in cath lab. Will use CO2 angiography.   CHIEF COMPLAINT: Left leg pain  HISTORY OF PRESENT ILLNESS: William Perkins is a 73 y.o. male who returns to clinic for evaluation of ischemic rest pain of the left lower extremity.  Patient is well-known to me, having undergone a right iliofemoral endarterectomy in 2022 for ischemic rest pain.  He is initially referred for evaluation of right subclavian artery stenosis identified on carotid artery duplex.  From the right upper extremity standpoint, the patient is asymptomatic.  He reports no claudication, dizziness, rest pain, ulceration of the right upper extremity.  The left lower extremity is incredibly painful to him.  The pain is very typical of ischemic rest pain.  The pain is about the metatarsal bones and is constant.  The pain awakens him from sleep.  I reviewed the plan for angiogram.  Past Medical History:  Diagnosis Date   Chronic renal insufficiency 03/21/2006   CrCl <50 ml   DDD (degenerative disc disease), cervical    GERD 03/22/2006   Qualifier: Diagnosis of  By: [name]     GERD (gastroesophageal reflux disease) 03/22/2006   Hand numbness 10/07/2009   pt states not any longer    Hyperlipidemia 03/22/2008   Hypertension    Hypertension 03/22/2006   Left ventricular hypertrophy 03/22/2006   Leg pain, left 11/15/2009   Lumbar strain 07/15/2008   Renal insufficiency    Soft tissue disorder 08/2008   Tobacco abuse 03/22/2006   2 ppd x 30 years    Past Surgical History:  Procedure Laterality Date   ABDOMINAL AORTOGRAM W/LOWER EXTREMITY N/A 05/18/2020   Procedure: ABDOMINAL AORTOGRAM W/LOWER EXTREMITY;  Surgeon: Magda Debby SAILOR, MD;  Location: [facility];  Service: Cardiovascular;  Laterality: N/A;   BACK SURGERY  2020   PLIF   COLONOSCOPY     DIRECT LARYNGOSCOPY N/A 01/24/2023   Procedure: DIRECT LARYNGOSCOPY;  Surgeon: Tobie Eldora NOVAK, MD;  Location[facility];  Service: ENT;  Laterality: N/A;  DIRECT LARYNGOSCOPY WITH BIOPSY OF VOCAL CORD LESION   ENDARTERECTOMY FEMORAL Right 05/20/2020   Procedure: RIGHT EXTENDED FEMORAL ENDARTERECTOMY AND PROFUNDAPLASTY;  Surgeon: Magda Debby SAILOR, MD;  Location: [facility] OR;  Service: Vascular;  Laterality: Right;   WISDOM TOOTH EXTRACTION      Family History  Problem Relation Age of Onset   Diabetes Mother    Diabetes Sister    Diabetes Brother    Heart disease Brother    Rectal cancer Neg Hx    Stomach cancer Neg Hx    Colon cancer Neg Hx     Social History   Socioeconomic History   Marital status: Married    Spouse name: Anette   Number of children: 0   Years of education: 55  Highest education level: Not on file  Occupational History   Occupation: Retired  Tobacco Use   Smoking status: Former    Current packs/day: 0.00    Average packs/day: 1 pack/day for 44.0 years (44.0 ttl pk-yrs)    Types: Cigarettes    Start date: 11/21/1969    Quit date: 11/21/2013    Years since quitting: 10.0   Smokeless tobacco: Never   Tobacco comments:    Stopped 11/21/2013   Vaping Use   Vaping status: Never Used  Substance and Sexual Activity   Alcohol use: No    Alcohol/week: 0.0 standard drinks of alcohol     Comment: Quit 2015   Drug use: No   Sexual activity: Yes    Partners: Female  Other Topics Concern   Not on file  Social History Narrative   Not on file   Social Drivers of Health   Financial Resource Strain: Low Risk  (02/19/2023)   Overall Financial Resource Strain (CARDIA)    Difficulty of Paying Living Expenses: Not hard at all  Food Insecurity: No Food Insecurity (02/19/2023)   Hunger Vital Sign    Worried About Running Out of Food in the Last Year: Never true    Ran Out of Food in the Last Year: Never true  Transportation Needs: No Transportation Needs (02/19/2023)   PRAPARE - Administrator, Civil Service (Medical): No    Lack of Transportation (Non-Medical): No  Physical Activity: Insufficiently Active (09/19/2022)   Exercise Vital Sign    Days of Exercise per Week: 7 days    Minutes of Exercise per Session: 10 min  Stress: No Stress Concern Present (09/19/2022)   [employer] of Occupational Health - Occupational Stress Questionnaire    Feeling of Stress : Not at all  Social Connections: Moderately Isolated (09/19/2022)   Social Connection and Isolation Panel    Frequency of Communication with Friends and Family: Once a week    Frequency of Social Gatherings with Friends and Family: Three times a week    Attends Religious Services: Never    Active Member of Clubs or Organizations: No    Attends Banker Meetings: Never    Marital Status: Married  Catering manager Violence: Not At Risk (02/19/2023)   Humiliation, Afraid, Rape, and Kick questionnaire    Fear of Current or Ex-Partner: No    Emotionally Abused: No    Physically Abused: No    Sexually Abused: No    No Known Allergies  Current Outpatient Medications  Medication Sig Dispense Refill   acetaminophen  (TYLENOL ) 500 MG tablet Take 1,000 mg by mouth every 6 (six) hours as needed for mild pain or moderate pain.     amLODipine  (NORVASC ) 10 MG tablet Take 1 tablet (10 mg total) by  mouth daily. 90 tablet 3   aspirin  EC 81 MG tablet Take 81 mg by mouth daily. Swallow whole.     atorvastatin  (LIPITOR) 80 MG tablet Take 1 tablet (80 mg total) by mouth daily. 90 tablet 3   cloNIDine  (CATAPRES ) 0.1 MG tablet Take 1 tablet (0.1 mg total) by mouth 2 (two) times daily. 180 tablet 3   lidocaine  (XYLOCAINE ) 2 % solution Patient: Mix 1part 2% viscous lidocaine , 1part H20. Swallow 10mL of diluted mixture, before meals and at bedtime, up to QID 200 mL 3   lisinopril  (ZESTRIL ) 40 MG tablet Take 1 tablet (40 mg total) by mouth daily. 90 tablet 3   oxyCODONE  (ROXICODONE ) 5 MG  immediate release tablet Take 1 tablet (5 mg total) by mouth every 4 (four) hours as needed for severe pain (pain score 7-10). 45 tablet 0   polyethylene glycol (MIRALAX  / GLYCOLAX ) 17 g packet Take 17 g by mouth 2 (two) times daily. 14 each 25   No current facility-administered medications for this visit.    PHYSICAL EXAM Vitals:   11/26/23 1035 11/26/23 1038  BP: (!) 145/75 112/76  Pulse: 63   Temp: 97.8 F (36.6 C)   SpO2: 96%   Weight: 133 lb (60.3 kg)   Height: 5' 11 (1.803 m)    Chronically ill elderly man No distress Regular rate and rhythm Unlabored breathing No palpable pedal pulses; left foot with dependent rubor  PERTINENT LABORATORY AND RADIOLOGIC DATA  Most recent CBC    Latest Ref Rng & Units 11/19/2023    4:31 PM 01/24/2023   12:53 PM 05/21/2020    1:40 AM  CBC  WBC 4.0 - 10.5 K/uL 6.1  6.2  11.7   Hemoglobin 13.0 - 17.0 g/dL 88.8  88.7  9.0   Hematocrit 39.0 - 52.0 % 34.9  34.9  27.1   Platelets 150 - 400 K/uL 254  244  249      Most recent CMP    Latest Ref Rng & Units 11/19/2023    4:31 PM 09/16/2023    2:25 PM 01/24/2023   12:53 PM  CMP  Glucose 70 - 99 mg/dL 890  751  886   BUN 8 - 23 mg/dL 34  40  27   Creatinine 0.61 - 1.24 mg/dL 7.53  7.41  7.55   Sodium 135 - 145 mmol/L 136  135  136   Potassium 3.5 - 5.1 mmol/L 4.4  4.4  3.5   Chloride 98 - 111 mmol/L 102   99  103   CO2 22 - 32 mmol/L 22  18  20    Calcium  8.9 - 10.3 mg/dL 9.5  9.9  9.2   Total Protein 6.5 - 8.1 g/dL 7.6     Total Bilirubin 0.0 - 1.2 mg/dL 0.8     Alkaline Phos 38 - 126 U/L 67     AST 15 - 41 U/L 24     ALT 0 - 44 U/L 19       Renal function Estimated Creatinine Clearance: 22.8 mL/min (A) (by C-G formula based on SCr of 2.46 mg/dL (H)).  Hemoglobin A1C (%)  Date Value  09/16/2023 6.9 (A)   Hgb A1c MFr Bld (%)  Date Value  08/17/2021 6.3 (H)    LDL Chol Calc (NIH)  Date Value Ref Range Status  09/16/2023 84 0 - 99 mg/dL Final     Vascular Imaging:  09/18/23: Carotid Duplex  Right Carotid: Velocities in the right ICA are consistent with a 40-59%                 stenosis. The ECA appears <50% stenosed.   Left Carotid: Velocities in the left ICA are consistent with a 1-39%  stenosis.               The ECA appears <50% stenosed.   Vertebrals: Bilateral vertebral arteries demonstrate antegrade flow.   09/26/23: Right upper extremity arterial duplex:  Right Doppler Findings:  +---------------+----------+----------+-------------+---------+  Site          PSV (cm/s)Waveform  Stenosis     Comments   +---------------+----------+----------+-------------+---------+  Subclavian Prox444       monophasic>50% stenosisshadowing  +---------------+----------+----------+-------------+---------+  Subclavian Dist27        monophasic                        +---------------+----------+----------+-------------+---------+  Axillary      33        biphasic                          +---------------+----------+----------+-------------+---------+  Brachial Prox  32        monophasic                        +---------------+----------+----------+-------------+---------+  Brachial Mid   42        monophasic                        +---------------+----------+----------+-------------+---------+  Brachial Dist  39        monophasic                         +---------------+----------+----------+-------------+---------+  Radial Prox    30        monophasic                        +---------------+----------+----------+-------------+---------+  Radial Mid     29        monophasic                        +---------------+----------+----------+-------------+---------+  Radial Dist    30        monophasic                        +---------------+----------+----------+-------------+---------+  Ulnar Prox     18        monophasic                        +---------------+----------+----------+-------------+---------+  Ulnar Mid      16        monophasic                        +---------------+----------+----------+-------------+---------+  Ulnar Dist     16        monophasic                        +---------------+----------+----------+-------------+---------+    11/26/23:  LOWER EXTREMITY DOPPLER STUDY   Patient Name:  William Perkins  Date of Exam:   11/26/2023  Medical Rec #: [id number]       Accession #:    [id number]  Date of Birth: 05/07/1950       Patient Gender: M  Patient Age:   38 years  Exam Location:  [address]  Procedure:      VAS US  ABI WITH/WO TBI  Referring Phys: Debby Robertson    ---------------------------------------------------------------------------  -----    Indications: Peripheral artery disease. Patient reports pain starting at  the              lower back and into both legs after walking about 5 feet,  left              worse than right. He also reports swelling in the left foot  and              toes for  the past week and a half. There appears to be small  wounds              to the left 2nd, 3rd and 5th toes.   High Risk         Hypertension, hyperlipidemia, Diabetes, past history of  Factors:          smoking.     Vascular Interventions: Procedures on 05/20/2020:                          1. Right common femoral endarterectomy                          and  bovine pericardial patch angioplasty                          2. Right superficial femoral endarterectomy and                          pericardial patch angioplasty                          3. Right extended profunda femoris endarterectomy  (to                         second order branches) and bovine pericardial  patch                         angioplasty.   Comparison Study: On 04/17/2022, a lower arterial Doppler showed                    non-compressible ABIs, bilaterally.   Performing Technologist: Nanetta Shad RVT     Examination Guidelines: A complete evaluation includes at minimum, Doppler  waveform signals and systolic blood pressure reading at the level of  bilateral  brachial, anterior tibial, and posterior tibial arteries, when vessel  segments  are accessible. Bilateral testing is considered an integral part of a  complete  examination. Photoelectric Plethysmograph (PPG) waveforms and toe systolic  pressure readings are included as required and additional duplex testing  as  needed. Limited examinations for reoccurring indications may be performed  as  noted.     ABI Findings:  +---------+------------------+-----+----------+--------+  Right   Rt Pressure (mmHg)IndexWaveform  Comment   +---------+------------------+-----+----------+--------+  Brachial 121                                        +---------+------------------+-----+----------+--------+  PTA     254               1.42 monophasic          +---------+------------------+-----+----------+--------+  DP      254               1.42 monophasic          +---------+------------------+-----+----------+--------+  Great Toe44                0.25 Abnormal            +---------+------------------+-----+----------+--------+   +---------+------------------+-----+----------+-------+  Left    Lt Pressure (mmHg)IndexWaveform  Comment   +---------+------------------+-----+----------+-------+  Brachial 179                                       +---------+------------------+-----+----------+-------+  PTA     46                0.26 monophasic         +---------+------------------+-----+----------+-------+  DP      0                 0.00 absent             +---------+------------------+-----+----------+-------+  Great Toe0                 0.00 Absent             +---------+------------------+-----+----------+-------+   +-------+----------------+-----------+----------------+------------+  ABI/TBIToday's ABI     Today's TBIPrevious ABI    Previous TBI  +-------+----------------+-----------+----------------+------------+  Right non-compressible.25        non-compressible.33           +-------+----------------+-----------+----------------+------------+  Left  .26             0.0        non-compressible.30           +-------+----------------+-----------+----------------+------------+         Right ABIs appear essentially unchanged compared to prior study on  04/17/2022. Left ABIs and TBIs appear decreased compared to prior study on  04/17/2022.    Summary:  Right: Resting right ankle-brachial index indicates noncompressible right  lower extremity arteries. The right toe-brachial index is abnormal.    Left: Resting left ankle-brachial index indicates critical left limb  ischemia. The left toe-brachial index is abnormal.    There is a 58 mmHg pressure difference between the arms, the right being  the lowest. The right brachial artery is monophasic; multiphasic on the  left. Consider possible right subclavian steal syndrome. Prior upper  arterial duplex on 09/25/2023 showed  velocities consistent with >50% stenosis in the right proximal subclavian  artery.   *See table(s) above for measurements and observations.     Suggest Peripheral Vascular Consult.   Debby SAILOR. Magda,  MD [facility] Phone Number: [phone] 11/26/2023 12:10 PM   Total time spent on preparing this encounter including chart review, data review, collecting history, examining the patient, and coordinating care: 30 min  Portions of this report may have been transcribed using voice recognition software.  Every effort has been made to ensure accuracy; however, inadvertent computerized transcription errors may still be present.

## 2023-11-25 NOTE — Progress Notes (Unsigned)
 [facility] [place]  ASSESSMENT / PLAN: William Perkins is a 73 y.o. male with atherosclerosis of native arteries of left causing ischemic rest pain.  He is status post right extended femoral endarterectomy in 2022.  He has no residual right lower extremity symptoms.  He has asymptomatic right subclavian artery stenosis.  Recommend:  Abstinence from all tobacco products. Blood glucose control with goal A1c < 7%. Blood pressure control with goal blood pressure < 130/80 mmHg. Lipid reduction therapy with goal LDL-C < 55 mg/dL. Aspirin  81mg  by mouth daily. Atorvastatin  40-80mg  PO QD (or other high intensity statin therapy).  Plan left lower extremity angiogram with possible intervention via right common femoral approach in cath lab. Will use CO2 angiography.   CHIEF COMPLAINT: Left leg pain  HISTORY OF PRESENT ILLNESS: William Perkins is a 73 y.o. male who returns to clinic for evaluation of ischemic rest pain of the left lower extremity.  Patient is well-known to me, having undergone a right iliofemoral endarterectomy in 2022 for ischemic rest pain.  He is initially referred for evaluation of right subclavian artery stenosis identified on carotid artery duplex.  From the right upper extremity standpoint, the patient is asymptomatic.  He reports no claudication, dizziness, rest pain, ulceration of the right upper extremity.  The left lower extremity is incredibly painful to him.  The pain is very typical of ischemic rest pain.  The pain is about the metatarsal bones and is constant.  The pain awakens him from sleep.  I reviewed the plan for angiogram.  Past Medical History:  Diagnosis Date   Chronic renal insufficiency 03/21/2006   CrCl <50 ml   DDD (degenerative disc disease), cervical    GERD 03/22/2006   Qualifier: Diagnosis of  By: [name]     GERD (gastroesophageal reflux disease) 03/22/2006   Hand numbness 10/07/2009   pt states not any longer    Hyperlipidemia 03/22/2008   Hypertension    Hypertension 03/22/2006   Left ventricular hypertrophy 03/22/2006   Leg pain, left 11/15/2009   Lumbar strain 07/15/2008   Renal insufficiency    Soft tissue disorder 08/2008   Tobacco abuse 03/22/2006   2 ppd x 30 years    Past Surgical History:  Procedure Laterality Date   ABDOMINAL AORTOGRAM W/LOWER EXTREMITY N/A 05/18/2020   Procedure: ABDOMINAL AORTOGRAM W/LOWER EXTREMITY;  Surgeon: Magda Debby SAILOR, MD;  Location: [facility];  Service: Cardiovascular;  Laterality: N/A;   BACK SURGERY  2020   PLIF   COLONOSCOPY     DIRECT LARYNGOSCOPY N/A 01/24/2023   Procedure: DIRECT LARYNGOSCOPY;  Surgeon: Tobie Eldora NOVAK, MD;  Location[facility];  Service: ENT;  Laterality: N/A;  DIRECT LARYNGOSCOPY WITH BIOPSY OF VOCAL CORD LESION   ENDARTERECTOMY FEMORAL Right 05/20/2020   Procedure: RIGHT EXTENDED FEMORAL ENDARTERECTOMY AND PROFUNDAPLASTY;  Surgeon: Magda Debby SAILOR, MD;  Location: [facility] OR;  Service: Vascular;  Laterality: Right;   WISDOM TOOTH EXTRACTION      Family History  Problem Relation Age of Onset   Diabetes Mother    Diabetes Sister    Diabetes Brother    Heart disease Brother    Rectal cancer Neg Hx    Stomach cancer Neg Hx    Colon cancer Neg Hx     Social History   Socioeconomic History   Marital status: Married    Spouse name: Anette   Number of children: 0   Years of education: 55  Highest education level: Not on file  Occupational History   Occupation: Retired  Tobacco Use   Smoking status: Former    Current packs/day: 0.00    Average packs/day: 1 pack/day for 44.0 years (44.0 ttl pk-yrs)    Types: Cigarettes    Start date: 11/21/1969    Quit date: 11/21/2013    Years since quitting: 10.0   Smokeless tobacco: Never   Tobacco comments:    Stopped 11/21/2013   Vaping Use   Vaping status: Never Used  Substance and Sexual Activity   Alcohol use: No    Alcohol/week: 0.0 standard drinks of alcohol     Comment: Quit 2015   Drug use: No   Sexual activity: Yes    Partners: Female  Other Topics Concern   Not on file  Social History Narrative   Not on file   Social Drivers of Health   Financial Resource Strain: Low Risk  (02/19/2023)   Overall Financial Resource Strain (CARDIA)    Difficulty of Paying Living Expenses: Not hard at all  Food Insecurity: No Food Insecurity (02/19/2023)   Hunger Vital Sign    Worried About Running Out of Food in the Last Year: Never true    Ran Out of Food in the Last Year: Never true  Transportation Needs: No Transportation Needs (02/19/2023)   PRAPARE - Administrator, Civil Service (Medical): No    Lack of Transportation (Non-Medical): No  Physical Activity: Insufficiently Active (09/19/2022)   Exercise Vital Sign    Days of Exercise per Week: 7 days    Minutes of Exercise per Session: 10 min  Stress: No Stress Concern Present (09/19/2022)   [employer] of Occupational Health - Occupational Stress Questionnaire    Feeling of Stress : Not at all  Social Connections: Moderately Isolated (09/19/2022)   Social Connection and Isolation Panel    Frequency of Communication with Friends and Family: Once a week    Frequency of Social Gatherings with Friends and Family: Three times a week    Attends Religious Services: Never    Active Member of Clubs or Organizations: No    Attends Banker Meetings: Never    Marital Status: Married  Catering manager Violence: Not At Risk (02/19/2023)   Humiliation, Afraid, Rape, and Kick questionnaire    Fear of Current or Ex-Partner: No    Emotionally Abused: No    Physically Abused: No    Sexually Abused: No    No Known Allergies  Current Outpatient Medications  Medication Sig Dispense Refill   acetaminophen  (TYLENOL ) 500 MG tablet Take 1,000 mg by mouth every 6 (six) hours as needed for mild pain or moderate pain.     amLODipine  (NORVASC ) 10 MG tablet Take 1 tablet (10 mg total) by  mouth daily. 90 tablet 3   aspirin  EC 81 MG tablet Take 81 mg by mouth daily. Swallow whole.     atorvastatin  (LIPITOR) 80 MG tablet Take 1 tablet (80 mg total) by mouth daily. 90 tablet 3   cloNIDine  (CATAPRES ) 0.1 MG tablet Take 1 tablet (0.1 mg total) by mouth 2 (two) times daily. 180 tablet 3   lidocaine  (XYLOCAINE ) 2 % solution Patient: Mix 1part 2% viscous lidocaine , 1part H20. Swallow 10mL of diluted mixture, before meals and at bedtime, up to QID 200 mL 3   lisinopril  (ZESTRIL ) 40 MG tablet Take 1 tablet (40 mg total) by mouth daily. 90 tablet 3   oxyCODONE  (ROXICODONE ) 5 MG  immediate release tablet Take 1 tablet (5 mg total) by mouth every 4 (four) hours as needed for severe pain (pain score 7-10). 45 tablet 0   polyethylene glycol (MIRALAX  / GLYCOLAX ) 17 g packet Take 17 g by mouth 2 (two) times daily. 14 each 25   No current facility-administered medications for this visit.    PHYSICAL EXAM Vitals:   11/26/23 1035 11/26/23 1038  BP: (!) 145/75 112/76  Pulse: 63   Temp: 97.8 F (36.6 C)   SpO2: 96%   Weight: 133 lb (60.3 kg)   Height: 5' 11 (1.803 m)    Chronically ill elderly man No distress Regular rate and rhythm Unlabored breathing No palpable pedal pulses; left foot with dependent rubor  PERTINENT LABORATORY AND RADIOLOGIC DATA  Most recent CBC    Latest Ref Rng & Units 11/19/2023    4:31 PM 01/24/2023   12:53 PM 05/21/2020    1:40 AM  CBC  WBC 4.0 - 10.5 K/uL 6.1  6.2  11.7   Hemoglobin 13.0 - 17.0 g/dL 88.8  88.7  9.0   Hematocrit 39.0 - 52.0 % 34.9  34.9  27.1   Platelets 150 - 400 K/uL 254  244  249      Most recent CMP    Latest Ref Rng & Units 11/19/2023    4:31 PM 09/16/2023    2:25 PM 01/24/2023   12:53 PM  CMP  Glucose 70 - 99 mg/dL 890  751  886   BUN 8 - 23 mg/dL 34  40  27   Creatinine 0.61 - 1.24 mg/dL 7.53  7.41  7.55   Sodium 135 - 145 mmol/L 136  135  136   Potassium 3.5 - 5.1 mmol/L 4.4  4.4  3.5   Chloride 98 - 111 mmol/L 102   99  103   CO2 22 - 32 mmol/L 22  18  20    Calcium  8.9 - 10.3 mg/dL 9.5  9.9  9.2   Total Protein 6.5 - 8.1 g/dL 7.6     Total Bilirubin 0.0 - 1.2 mg/dL 0.8     Alkaline Phos 38 - 126 U/L 67     AST 15 - 41 U/L 24     ALT 0 - 44 U/L 19       Renal function Estimated Creatinine Clearance: 22.8 mL/min (A) (by C-G formula based on SCr of 2.46 mg/dL (H)).  Hemoglobin A1C (%)  Date Value  09/16/2023 6.9 (A)   Hgb A1c MFr Bld (%)  Date Value  08/17/2021 6.3 (H)    LDL Chol Calc (NIH)  Date Value Ref Range Status  09/16/2023 84 0 - 99 mg/dL Final     Vascular Imaging:  09/18/23: Carotid Duplex  Right Carotid: Velocities in the right ICA are consistent with a 40-59%                 stenosis. The ECA appears <50% stenosed.   Left Carotid: Velocities in the left ICA are consistent with a 1-39%  stenosis.               The ECA appears <50% stenosed.   Vertebrals: Bilateral vertebral arteries demonstrate antegrade flow.   09/26/23: Right upper extremity arterial duplex:  Right Doppler Findings:  +---------------+----------+----------+-------------+---------+  Site          PSV (cm/s)Waveform  Stenosis     Comments   +---------------+----------+----------+-------------+---------+  Subclavian Prox444       monophasic>50% stenosisshadowing  +---------------+----------+----------+-------------+---------+  Subclavian Dist27        monophasic                        +---------------+----------+----------+-------------+---------+  Axillary      33        biphasic                          +---------------+----------+----------+-------------+---------+  Brachial Prox  32        monophasic                        +---------------+----------+----------+-------------+---------+  Brachial Mid   42        monophasic                        +---------------+----------+----------+-------------+---------+  Brachial Dist  39        monophasic                         +---------------+----------+----------+-------------+---------+  Radial Prox    30        monophasic                        +---------------+----------+----------+-------------+---------+  Radial Mid     29        monophasic                        +---------------+----------+----------+-------------+---------+  Radial Dist    30        monophasic                        +---------------+----------+----------+-------------+---------+  Ulnar Prox     18        monophasic                        +---------------+----------+----------+-------------+---------+  Ulnar Mid      16        monophasic                        +---------------+----------+----------+-------------+---------+  Ulnar Dist     16        monophasic                        +---------------+----------+----------+-------------+---------+    11/26/23:  LOWER EXTREMITY DOPPLER STUDY   Patient Name:  BOBBYE REINITZ  Date of Exam:   11/26/2023  Medical Rec #: [id number]       Accession #:    [id number]  Date of Birth: 05/07/1950       Patient Gender: M  Patient Age:   38 years  Exam Location:  [address]  Procedure:      VAS US  ABI WITH/WO TBI  Referring Phys: Debby Robertson    ---------------------------------------------------------------------------  -----    Indications: Peripheral artery disease. Patient reports pain starting at  the              lower back and into both legs after walking about 5 feet,  left              worse than right. He also reports swelling in the left foot  and              toes for  the past week and a half. There appears to be small  wounds              to the left 2nd, 3rd and 5th toes.   High Risk         Hypertension, hyperlipidemia, Diabetes, past history of  Factors:          smoking.     Vascular Interventions: Procedures on 05/20/2020:                          1. Right common femoral endarterectomy                          and  bovine pericardial patch angioplasty                          2. Right superficial femoral endarterectomy and                          pericardial patch angioplasty                          3. Right extended profunda femoris endarterectomy  (to                         second order branches) and bovine pericardial  patch                         angioplasty.   Comparison Study: On 04/17/2022, a lower arterial Doppler showed                    non-compressible ABIs, bilaterally.   Performing Technologist: Nanetta Shad RVT     Examination Guidelines: A complete evaluation includes at minimum, Doppler  waveform signals and systolic blood pressure reading at the level of  bilateral  brachial, anterior tibial, and posterior tibial arteries, when vessel  segments  are accessible. Bilateral testing is considered an integral part of a  complete  examination. Photoelectric Plethysmograph (PPG) waveforms and toe systolic  pressure readings are included as required and additional duplex testing  as  needed. Limited examinations for reoccurring indications may be performed  as  noted.     ABI Findings:  +---------+------------------+-----+----------+--------+  Right   Rt Pressure (mmHg)IndexWaveform  Comment   +---------+------------------+-----+----------+--------+  Brachial 121                                        +---------+------------------+-----+----------+--------+  PTA     254               1.42 monophasic          +---------+------------------+-----+----------+--------+  DP      254               1.42 monophasic          +---------+------------------+-----+----------+--------+  Great Toe44                0.25 Abnormal            +---------+------------------+-----+----------+--------+   +---------+------------------+-----+----------+-------+  Left    Lt Pressure (mmHg)IndexWaveform  Comment   +---------+------------------+-----+----------+-------+  Brachial 179                                       +---------+------------------+-----+----------+-------+  PTA     46                0.26 monophasic         +---------+------------------+-----+----------+-------+  DP      0                 0.00 absent             +---------+------------------+-----+----------+-------+  Great Toe0                 0.00 Absent             +---------+------------------+-----+----------+-------+   +-------+----------------+-----------+----------------+------------+  ABI/TBIToday's ABI     Today's TBIPrevious ABI    Previous TBI  +-------+----------------+-----------+----------------+------------+  Right non-compressible.25        non-compressible.33           +-------+----------------+-----------+----------------+------------+  Left  .26             0.0        non-compressible.30           +-------+----------------+-----------+----------------+------------+         Right ABIs appear essentially unchanged compared to prior study on  04/17/2022. Left ABIs and TBIs appear decreased compared to prior study on  04/17/2022.    Summary:  Right: Resting right ankle-brachial index indicates noncompressible right  lower extremity arteries. The right toe-brachial index is abnormal.    Left: Resting left ankle-brachial index indicates critical left limb  ischemia. The left toe-brachial index is abnormal.    There is a 58 mmHg pressure difference between the arms, the right being  the lowest. The right brachial artery is monophasic; multiphasic on the  left. Consider possible right subclavian steal syndrome. Prior upper  arterial duplex on 09/25/2023 showed  velocities consistent with >50% stenosis in the right proximal subclavian  artery.   *See table(s) above for measurements and observations.     Suggest Peripheral Vascular Consult.   Debby SAILOR. Magda,  MD [facility] Phone Number: [phone] 11/26/2023 12:10 PM   Total time spent on preparing this encounter including chart review, data review, collecting history, examining the patient, and coordinating care: 30 min  Portions of this report may have been transcribed using voice recognition software.  Every effort has been made to ensure accuracy; however, inadvertent computerized transcription errors may still be present.

## 2023-11-25 NOTE — H&P (View-Only) (Signed)
 [facility] [place]  ASSESSMENT / PLAN: William Perkins is a 73 y.o. male with atherosclerosis of native arteries of left causing ischemic rest pain.  He is status post right extended femoral endarterectomy in 2022.  He has no residual right lower extremity symptoms.  He has asymptomatic right subclavian artery stenosis.  Recommend:  Abstinence from all tobacco products. Blood glucose control with goal A1c < 7%. Blood pressure control with goal blood pressure < 130/80 mmHg. Lipid reduction therapy with goal LDL-C < 55 mg/dL. Aspirin  81mg  by mouth daily. Atorvastatin  40-80mg  PO QD (or other high intensity statin therapy).  Plan left lower extremity angiogram with possible intervention via right common femoral approach in cath lab. Will use CO2 angiography.   CHIEF COMPLAINT: Left leg pain  HISTORY OF PRESENT ILLNESS: William Perkins is a 73 y.o. male who returns to clinic for evaluation of ischemic rest pain of the left lower extremity.  Patient is well-known to me, having undergone a right iliofemoral endarterectomy in 2022 for ischemic rest pain.  He is initially referred for evaluation of right subclavian artery stenosis identified on carotid artery duplex.  From the right upper extremity standpoint, the patient is asymptomatic.  He reports no claudication, dizziness, rest pain, ulceration of the right upper extremity.  The left lower extremity is incredibly painful to him.  The pain is very typical of ischemic rest pain.  The pain is about the metatarsal bones and is constant.  The pain awakens him from sleep.  I reviewed the plan for angiogram.  Past Medical History:  Diagnosis Date   Chronic renal insufficiency 03/21/2006   CrCl <50 ml   DDD (degenerative disc disease), cervical    GERD 03/22/2006   Qualifier: Diagnosis of  By: [name]     GERD (gastroesophageal reflux disease) 03/22/2006   Hand numbness 10/07/2009   pt states not any longer    Hyperlipidemia 03/22/2008   Hypertension    Hypertension 03/22/2006   Left ventricular hypertrophy 03/22/2006   Leg pain, left 11/15/2009   Lumbar strain 07/15/2008   Renal insufficiency    Soft tissue disorder 08/2008   Tobacco abuse 03/22/2006   2 ppd x 30 years    Past Surgical History:  Procedure Laterality Date   ABDOMINAL AORTOGRAM W/LOWER EXTREMITY N/A 05/18/2020   Procedure: ABDOMINAL AORTOGRAM W/LOWER EXTREMITY;  Surgeon: Magda Debby SAILOR, MD;  Location: [facility];  Service: Cardiovascular;  Laterality: N/A;   BACK SURGERY  2020   PLIF   COLONOSCOPY     DIRECT LARYNGOSCOPY N/A 01/24/2023   Procedure: DIRECT LARYNGOSCOPY;  Surgeon: Tobie Eldora NOVAK, MD;  Location[facility];  Service: ENT;  Laterality: N/A;  DIRECT LARYNGOSCOPY WITH BIOPSY OF VOCAL CORD LESION   ENDARTERECTOMY FEMORAL Right 05/20/2020   Procedure: RIGHT EXTENDED FEMORAL ENDARTERECTOMY AND PROFUNDAPLASTY;  Surgeon: Magda Debby SAILOR, MD;  Location: [facility] OR;  Service: Vascular;  Laterality: Right;   WISDOM TOOTH EXTRACTION      Family History  Problem Relation Age of Onset   Diabetes Mother    Diabetes Sister    Diabetes Brother    Heart disease Brother    Rectal cancer Neg Hx    Stomach cancer Neg Hx    Colon cancer Neg Hx     Social History   Socioeconomic History   Marital status: Married    Spouse name: Anette   Number of children: 0   Years of education: 55  Highest education level: Not on file  Occupational History   Occupation: Retired  Tobacco Use   Smoking status: Former    Current packs/day: 0.00    Average packs/day: 1 pack/day for 44.0 years (44.0 ttl pk-yrs)    Types: Cigarettes    Start date: 11/21/1969    Quit date: 11/21/2013    Years since quitting: 10.0   Smokeless tobacco: Never   Tobacco comments:    Stopped 11/21/2013   Vaping Use   Vaping status: Never Used  Substance and Sexual Activity   Alcohol use: No    Alcohol/week: 0.0 standard drinks of alcohol     Comment: Quit 2015   Drug use: No   Sexual activity: Yes    Partners: Female  Other Topics Concern   Not on file  Social History Narrative   Not on file   Social Drivers of Health   Financial Resource Strain: Low Risk  (02/19/2023)   Overall Financial Resource Strain (CARDIA)    Difficulty of Paying Living Expenses: Not hard at all  Food Insecurity: No Food Insecurity (02/19/2023)   Hunger Vital Sign    Worried About Running Out of Food in the Last Year: Never true    Ran Out of Food in the Last Year: Never true  Transportation Needs: No Transportation Needs (02/19/2023)   PRAPARE - Administrator, Civil Service (Medical): No    Lack of Transportation (Non-Medical): No  Physical Activity: Insufficiently Active (09/19/2022)   Exercise Vital Sign    Days of Exercise per Week: 7 days    Minutes of Exercise per Session: 10 min  Stress: No Stress Concern Present (09/19/2022)   [employer] of Occupational Health - Occupational Stress Questionnaire    Feeling of Stress : Not at all  Social Connections: Moderately Isolated (09/19/2022)   Social Connection and Isolation Panel    Frequency of Communication with Friends and Family: Once a week    Frequency of Social Gatherings with Friends and Family: Three times a week    Attends Religious Services: Never    Active Member of Clubs or Organizations: No    Attends Banker Meetings: Never    Marital Status: Married  Catering manager Violence: Not At Risk (02/19/2023)   Humiliation, Afraid, Rape, and Kick questionnaire    Fear of Current or Ex-Partner: No    Emotionally Abused: No    Physically Abused: No    Sexually Abused: No    No Known Allergies  Current Outpatient Medications  Medication Sig Dispense Refill   acetaminophen  (TYLENOL ) 500 MG tablet Take 1,000 mg by mouth every 6 (six) hours as needed for mild pain or moderate pain.     amLODipine  (NORVASC ) 10 MG tablet Take 1 tablet (10 mg total) by  mouth daily. 90 tablet 3   aspirin  EC 81 MG tablet Take 81 mg by mouth daily. Swallow whole.     atorvastatin  (LIPITOR) 80 MG tablet Take 1 tablet (80 mg total) by mouth daily. 90 tablet 3   cloNIDine  (CATAPRES ) 0.1 MG tablet Take 1 tablet (0.1 mg total) by mouth 2 (two) times daily. 180 tablet 3   lidocaine  (XYLOCAINE ) 2 % solution Patient: Mix 1part 2% viscous lidocaine , 1part H20. Swallow 10mL of diluted mixture, before meals and at bedtime, up to QID 200 mL 3   lisinopril  (ZESTRIL ) 40 MG tablet Take 1 tablet (40 mg total) by mouth daily. 90 tablet 3   oxyCODONE  (ROXICODONE ) 5 MG  immediate release tablet Take 1 tablet (5 mg total) by mouth every 4 (four) hours as needed for severe pain (pain score 7-10). 45 tablet 0   polyethylene glycol (MIRALAX  / GLYCOLAX ) 17 g packet Take 17 g by mouth 2 (two) times daily. 14 each 25   No current facility-administered medications for this visit.    PHYSICAL EXAM Vitals:   11/26/23 1035 11/26/23 1038  BP: (!) 145/75 112/76  Pulse: 63   Temp: 97.8 F (36.6 C)   SpO2: 96%   Weight: 133 lb (60.3 kg)   Height: 5' 11 (1.803 m)    Chronically ill elderly man No distress Regular rate and rhythm Unlabored breathing No palpable pedal pulses; left foot with dependent rubor  PERTINENT LABORATORY AND RADIOLOGIC DATA  Most recent CBC    Latest Ref Rng & Units 11/19/2023    4:31 PM 01/24/2023   12:53 PM 05/21/2020    1:40 AM  CBC  WBC 4.0 - 10.5 K/uL 6.1  6.2  11.7   Hemoglobin 13.0 - 17.0 g/dL 88.8  88.7  9.0   Hematocrit 39.0 - 52.0 % 34.9  34.9  27.1   Platelets 150 - 400 K/uL 254  244  249      Most recent CMP    Latest Ref Rng & Units 11/19/2023    4:31 PM 09/16/2023    2:25 PM 01/24/2023   12:53 PM  CMP  Glucose 70 - 99 mg/dL 890  751  886   BUN 8 - 23 mg/dL 34  40  27   Creatinine 0.61 - 1.24 mg/dL 7.53  7.41  7.55   Sodium 135 - 145 mmol/L 136  135  136   Potassium 3.5 - 5.1 mmol/L 4.4  4.4  3.5   Chloride 98 - 111 mmol/L 102   99  103   CO2 22 - 32 mmol/L 22  18  20    Calcium  8.9 - 10.3 mg/dL 9.5  9.9  9.2   Total Protein 6.5 - 8.1 g/dL 7.6     Total Bilirubin 0.0 - 1.2 mg/dL 0.8     Alkaline Phos 38 - 126 U/L 67     AST 15 - 41 U/L 24     ALT 0 - 44 U/L 19       Renal function Estimated Creatinine Clearance: 22.8 mL/min (A) (by C-G formula based on SCr of 2.46 mg/dL (H)).  Hemoglobin A1C (%)  Date Value  09/16/2023 6.9 (A)   Hgb A1c MFr Bld (%)  Date Value  08/17/2021 6.3 (H)    LDL Chol Calc (NIH)  Date Value Ref Range Status  09/16/2023 84 0 - 99 mg/dL Final     Vascular Imaging:  09/18/23: Carotid Duplex  Right Carotid: Velocities in the right ICA are consistent with a 40-59%                 stenosis. The ECA appears <50% stenosed.   Left Carotid: Velocities in the left ICA are consistent with a 1-39%  stenosis.               The ECA appears <50% stenosed.   Vertebrals: Bilateral vertebral arteries demonstrate antegrade flow.   09/26/23: Right upper extremity arterial duplex:  Right Doppler Findings:  +---------------+----------+----------+-------------+---------+  Site          PSV (cm/s)Waveform  Stenosis     Comments   +---------------+----------+----------+-------------+---------+  Subclavian Prox444       monophasic>50% stenosisshadowing  +---------------+----------+----------+-------------+---------+  Subclavian Dist27        monophasic                        +---------------+----------+----------+-------------+---------+  Axillary      33        biphasic                          +---------------+----------+----------+-------------+---------+  Brachial Prox  32        monophasic                        +---------------+----------+----------+-------------+---------+  Brachial Mid   42        monophasic                        +---------------+----------+----------+-------------+---------+  Brachial Dist  39        monophasic                         +---------------+----------+----------+-------------+---------+  Radial Prox    30        monophasic                        +---------------+----------+----------+-------------+---------+  Radial Mid     29        monophasic                        +---------------+----------+----------+-------------+---------+  Radial Dist    30        monophasic                        +---------------+----------+----------+-------------+---------+  Ulnar Prox     18        monophasic                        +---------------+----------+----------+-------------+---------+  Ulnar Mid      16        monophasic                        +---------------+----------+----------+-------------+---------+  Ulnar Dist     16        monophasic                        +---------------+----------+----------+-------------+---------+    11/26/23:  LOWER EXTREMITY DOPPLER STUDY   Patient Name:  BOBBYE REINITZ  Date of Exam:   11/26/2023  Medical Rec #: [id number]       Accession #:    [id number]  Date of Birth: 05/07/1950       Patient Gender: M  Patient Age:   38 years  Exam Location:  [address]  Procedure:      VAS US  ABI WITH/WO TBI  Referring Phys: Debby Robertson    ---------------------------------------------------------------------------  -----    Indications: Peripheral artery disease. Patient reports pain starting at  the              lower back and into both legs after walking about 5 feet,  left              worse than right. He also reports swelling in the left foot  and              toes for  the past week and a half. There appears to be small  wounds              to the left 2nd, 3rd and 5th toes.   High Risk         Hypertension, hyperlipidemia, Diabetes, past history of  Factors:          smoking.     Vascular Interventions: Procedures on 05/20/2020:                          1. Right common femoral endarterectomy                          and  bovine pericardial patch angioplasty                          2. Right superficial femoral endarterectomy and                          pericardial patch angioplasty                          3. Right extended profunda femoris endarterectomy  (to                         second order branches) and bovine pericardial  patch                         angioplasty.   Comparison Study: On 04/17/2022, a lower arterial Doppler showed                    non-compressible ABIs, bilaterally.   Performing Technologist: Nanetta Shad RVT     Examination Guidelines: A complete evaluation includes at minimum, Doppler  waveform signals and systolic blood pressure reading at the level of  bilateral  brachial, anterior tibial, and posterior tibial arteries, when vessel  segments  are accessible. Bilateral testing is considered an integral part of a  complete  examination. Photoelectric Plethysmograph (PPG) waveforms and toe systolic  pressure readings are included as required and additional duplex testing  as  needed. Limited examinations for reoccurring indications may be performed  as  noted.     ABI Findings:  +---------+------------------+-----+----------+--------+  Right   Rt Pressure (mmHg)IndexWaveform  Comment   +---------+------------------+-----+----------+--------+  Brachial 121                                        +---------+------------------+-----+----------+--------+  PTA     254               1.42 monophasic          +---------+------------------+-----+----------+--------+  DP      254               1.42 monophasic          +---------+------------------+-----+----------+--------+  Great Toe44                0.25 Abnormal            +---------+------------------+-----+----------+--------+   +---------+------------------+-----+----------+-------+  Left    Lt Pressure (mmHg)IndexWaveform  Comment   +---------+------------------+-----+----------+-------+  Brachial 179                                       +---------+------------------+-----+----------+-------+  PTA     46                0.26 monophasic         +---------+------------------+-----+----------+-------+  DP      0                 0.00 absent             +---------+------------------+-----+----------+-------+  Great Toe0                 0.00 Absent             +---------+------------------+-----+----------+-------+   +-------+----------------+-----------+----------------+------------+  ABI/TBIToday's ABI     Today's TBIPrevious ABI    Previous TBI  +-------+----------------+-----------+----------------+------------+  Right non-compressible.25        non-compressible.33           +-------+----------------+-----------+----------------+------------+  Left  .26             0.0        non-compressible.30           +-------+----------------+-----------+----------------+------------+         Right ABIs appear essentially unchanged compared to prior study on  04/17/2022. Left ABIs and TBIs appear decreased compared to prior study on  04/17/2022.    Summary:  Right: Resting right ankle-brachial index indicates noncompressible right  lower extremity arteries. The right toe-brachial index is abnormal.    Left: Resting left ankle-brachial index indicates critical left limb  ischemia. The left toe-brachial index is abnormal.    There is a 58 mmHg pressure difference between the arms, the right being  the lowest. The right brachial artery is monophasic; multiphasic on the  left. Consider possible right subclavian steal syndrome. Prior upper  arterial duplex on 09/25/2023 showed  velocities consistent with >50% stenosis in the right proximal subclavian  artery.   *See table(s) above for measurements and observations.     Suggest Peripheral Vascular Consult.   Debby SAILOR. Magda,  MD [facility] Phone Number: [phone] 11/26/2023 12:10 PM   Total time spent on preparing this encounter including chart review, data review, collecting history, examining the patient, and coordinating care: 30 min  Portions of this report may have been transcribed using voice recognition software.  Every effort has been made to ensure accuracy; however, inadvertent computerized transcription errors may still be present.

## 2023-11-26 ENCOUNTER — Other Ambulatory Visit: Payer: Self-pay

## 2023-11-26 ENCOUNTER — Ambulatory Visit: Admitting: Vascular Surgery

## 2023-11-26 ENCOUNTER — Encounter: Payer: Self-pay | Admitting: Vascular Surgery

## 2023-11-26 ENCOUNTER — Ambulatory Visit
Admission: RE | Admit: 2023-11-26 | Discharge: 2023-11-26 | Disposition: A | Discharge status: Home / Self Care | Source: Ambulatory Visit | Attending: Vascular Surgery | Admitting: Vascular Surgery

## 2023-11-26 VITALS — BP 112/76 | HR 63 | Temp 97.8°F | Ht 71.0 in | Wt 133.0 lb

## 2023-11-26 DIAGNOSIS — I739 Peripheral vascular disease, unspecified: Secondary | ICD-10-CM | POA: Diagnosis not present

## 2023-11-26 DIAGNOSIS — I70222 Atherosclerosis of native arteries of extremities with rest pain, left leg: Secondary | ICD-10-CM | POA: Diagnosis not present

## 2023-11-26 LAB — VAS US ABI WITH/WO TBI: Left ABI: 0.26

## 2023-11-26 NOTE — Telephone Encounter (Signed)
 Copied from CRM #[id number]. Topic: General - Other >> Nov 26, 2023  1:40 PM Susanna ORN wrote: Reason for CRM: Patient called in stating that he received a call for him to call the office and let them know that he went to his appt. Patient stated that he did go.

## 2023-11-27 ENCOUNTER — Telehealth: Payer: Self-pay

## 2023-11-27 ENCOUNTER — Other Ambulatory Visit: Payer: Self-pay | Admitting: Vascular Surgery

## 2023-11-27 ENCOUNTER — Ambulatory Visit

## 2023-11-27 VITALS — Ht 71.0 in | Wt 133.0 lb

## 2023-11-27 DIAGNOSIS — Z0000 Encounter for general adult medical examination without abnormal findings: Secondary | ICD-10-CM | POA: Diagnosis not present

## 2023-11-27 MED ORDER — OXYCODONE-ACETAMINOPHEN 5-325 MG PO TABS: 1.0000 | ORAL_TABLET | ORAL | 0 refills | Status: DC | PRN

## 2023-11-27 NOTE — Telephone Encounter (Signed)
 Medication Request: -Pt called to check on a prescription for pain medicine per his conversation during his last visit.   -messaged MD and pain medicine order sent to pharmacy.  -returned call to pt and advised that this is a short term order, that he may not receive any additional orders or refills-please use sparingly

## 2023-11-27 NOTE — Progress Notes (Signed)
 Because this visit was a virtual/telehealth visit,  certain criteria was not obtained, such a blood pressure, CBG if applicable, and timed get up and go. Any medications not marked as taking were not mentioned during the medication reconciliation part of the visit. Any vitals not documented were not able to be obtained due to this being a telehealth visit or patient was unable to self-report a recent blood pressure reading due to a lack of equipment at home via telehealth. Vitals that have been documented are verbally provided by the patient.   Subjective:   William Perkins is a 73 y.o. who presents for a Medicare Wellness preventive visit.  As a reminder, Annual Wellness Visits don't include a physical exam, and some assessments may be limited, especially if this visit is performed virtually. We may recommend an in-person follow-up visit with your provider if needed.  Visit Complete: Virtual I connected with  William Perkins on 11/27/23 by a audio enabled telemedicine application and verified that I am speaking with the correct person using two identifiers.  Patient Location: Home  Provider Location: Office/Clinic  I discussed the limitations of evaluation and management by telemedicine. The patient expressed understanding and agreed to proceed.  Vital Signs: Because this visit was a virtual/telehealth visit, some criteria may be missing or patient reported. Any vitals not documented were not able to be obtained and vitals that have been documented are patient reported.  VideoDeclined- This patient declined Librarian, academic. Therefore the visit was completed with audio only.  Persons Participating in Visit: Patient.  AWV Questionnaire: No: Patient Medicare AWV questionnaire was not completed prior to this visit.  Cardiac Risk Factors include: advanced age (>81men, >41 women);dyslipidemia;family history of premature cardiovascular disease;hypertension;male  gender;sedentary lifestyle     Objective:    Today's Vitals   11/27/23 1328  Weight: 133 lb (60.3 kg)  Height: 5' 11 (1.803 m)  PainSc: 8   PainLoc: Foot   Body mass index is 18.55 kg/m.     11/27/2023    1:34 PM 11/19/2023    4:09 PM 11/19/2023    2:04 PM 11/05/2023    1:41 PM 06/13/2023   10:57 AM 02/11/2023    7:39 AM 10/18/2022    8:41 AM  Advanced Directives  Does Patient Have a Medical Advance Directive? No No No No No No No  Would patient like information on creating a medical advance directive? No - Patient declined No - Patient declined No - Patient declined  No - Patient declined No - Patient declined No - Patient declined    Current Medications (verified) Outpatient Encounter Medications as of 11/27/2023  Medication Sig   oxyCODONE -acetaminophen  (PERCOCET/ROXICET) 5-325 MG tablet Take 1 tablet by mouth every 4 (four) hours as needed for severe pain (pain score 7-10).   acetaminophen  (TYLENOL ) 500 MG tablet Take 1,000 mg by mouth every 6 (six) hours as needed for mild pain or moderate pain.   amLODipine  (NORVASC ) 10 MG tablet Take 1 tablet (10 mg total) by mouth daily.   aspirin  EC 81 MG tablet Take 81 mg by mouth daily. Swallow whole.   atorvastatin  (LIPITOR) 80 MG tablet Take 1 tablet (80 mg total) by mouth daily.   cloNIDine  (CATAPRES ) 0.1 MG tablet Take 1 tablet (0.1 mg total) by mouth 2 (two) times daily.   lidocaine  (XYLOCAINE ) 2 % solution Patient: Mix 1part 2% viscous lidocaine , 1part H20. Swallow 10mL of diluted mixture, before meals and at bedtime, up to  QID   lisinopril  (ZESTRIL ) 40 MG tablet Take 1 tablet (40 mg total) by mouth daily.   oxyCODONE  (ROXICODONE ) 5 MG immediate release tablet Take 1 tablet (5 mg total) by mouth every 4 (four) hours as needed for severe pain (pain score 7-10).   polyethylene glycol (MIRALAX  / GLYCOLAX ) 17 g packet Take 17 g by mouth 2 (two) times daily.   No facility-administered encounter medications on file as of 11/27/2023.     Allergies (verified) Patient has no known allergies.   History: Past Medical History:  Diagnosis Date   Chronic renal insufficiency 03/21/2006   CrCl <50 ml   DDD (degenerative disc disease), cervical    GERD 03/22/2006   Qualifier: Diagnosis of  By: William Perkins     GERD (gastroesophageal reflux disease) 03/22/2006   Hand numbness 10/07/2009   pt states not any longer   Hyperlipidemia 03/22/2008   Hypertension    Hypertension 03/22/2006   Left ventricular hypertrophy 03/22/2006   Leg pain, left 11/15/2009   Lumbar strain 07/15/2008   Renal insufficiency    Soft tissue disorder 08/2008   Tobacco abuse 03/22/2006   2 ppd x 30 years   Past Surgical History:  Procedure Laterality Date   ABDOMINAL AORTOGRAM W/LOWER EXTREMITY N/A 05/18/2020   Procedure: ABDOMINAL AORTOGRAM W/LOWER EXTREMITY;  Surgeon: William Debby SAILOR, MD;  Location: [facility] INVASIVE [facility];  Service: Cardiovascular;  Laterality: N/A;   BACK SURGERY  2020   PLIF   COLONOSCOPY     DIRECT LARYNGOSCOPY N/A 01/24/2023   Procedure: DIRECT LARYNGOSCOPY;  Surgeon: William Eldora NOVAK, MD;  [facility];  Service: ENT;  Laterality: N/A;  DIRECT LARYNGOSCOPY WITH BIOPSY OF VOCAL CORD LESION   ENDARTERECTOMY FEMORAL Right 05/20/2020   Procedure: RIGHT EXTENDED FEMORAL ENDARTERECTOMY AND PROFUNDAPLASTY;  Surgeon: William Debby SAILOR, MD;  Location: [facility] OR;  Service: Vascular;  Laterality: Right;   WISDOM TOOTH EXTRACTION     Family History  Problem Relation Age of Onset   Diabetes Mother    Diabetes Sister    Diabetes Brother    Heart disease Brother    Rectal cancer Neg Hx    Stomach cancer Neg Hx    Colon cancer Neg Hx    Social History   Socioeconomic History   Marital status: Married    Spouse name: William Perkins   Number of children: 0   Years of education: 12   Highest education level: Not on file  Occupational History   Occupation: Retired  Tobacco Use   Smoking status: Former    Current packs/day: 0.00     Average packs/day: 1 pack/day for 44.0 years (44.0 ttl pk-yrs)    Types: Cigarettes    Start date: 11/21/1969    Quit date: 11/21/2013    Years since quitting: 10.0   Smokeless tobacco: Never   Tobacco comments:    Stopped 11/21/2013   Vaping Use   Vaping status: Never Used  Substance and Sexual Activity   Alcohol use: No    Alcohol/week: 0.0 standard drinks of alcohol    Comment: Quit 2015   Drug use: No   Sexual activity: Yes    Partners: Female  Other Topics Concern   Not on file  Social History Narrative   Not on file   Social Drivers of Health   Financial Resource Strain: Low Risk  (11/27/2023)   Overall Financial Resource Strain (CARDIA)    Difficulty of Paying Living Expenses: Not hard at all  Food  Insecurity: No Food Insecurity (11/27/2023)   Hunger Vital Sign    Worried About Running Out of Food in the Last Year: Never true    Ran Out of Food in the Last Year: Never true  Transportation Needs: No Transportation Needs (11/27/2023)   PRAPARE - Administrator, Civil Service (Medical): No    Lack of Transportation (Non-Medical): No  Physical Activity: Insufficiently Active (11/27/2023)   Exercise Vital Sign    Days of Exercise per Week: 7 days    Minutes of Exercise per Session: 20 min  Stress: No Stress Concern Present (11/27/2023)   [employer] of Occupational Health - Occupational Stress Questionnaire    Feeling of Stress: Not at all  Social Connections: Moderately Isolated (11/27/2023)   Social Connection and Isolation Panel    Frequency of Communication with Friends and Family: Once a week    Frequency of Social Gatherings with Friends and Family: Three times a week    Attends Religious Services: Never    Active Member of Clubs or Organizations: No    Attends Banker Meetings: Never    Marital Status: Married    Tobacco Counseling Counseling given: Not Answered Tobacco comments: Stopped 11/21/2013     Clinical  Intake:  Pre-visit preparation completed: Yes  Pain : 0-10 Pain Score: 8  Pain Type: Acute pain Pain Location: Foot Pain Orientation: Left     BMI - recorded: 18.55 Nutritional Status: BMI <19  Underweight Nutritional Risks: None Diabetes: No CBG done?: No Did pt. bring in CBG monitor from home?: No  Lab Results  Component Value Date   HGBA1C 6.9 (A) 09/16/2023   HGBA1C 6.2 (A) 10/18/2022   HGBA1C 5.4 03/22/2022     How often do you need to have someone help you when you read instructions, pamphlets, or other written materials from your doctor or pharmacy?: 1 - Never What is the last grade level you completed in school?: HSG  Interpreter Needed?: No  Information entered by :: Roz Fuller, LPN.   Activities of Daily Living     11/27/2023    1:35 PM 01/24/2023    1:21 PM  In your present state of health, do you have any difficulty performing the following activities:  Hearing? 0   Vision? 0   Difficulty concentrating or making decisions? 0   Walking or climbing stairs? 1   Dressing or bathing? 0   Doing errands, shopping? 0 0  Preparing Food and eating ? N   Using the Toilet? N   In the past six months, have you accidently leaked urine? N   Do you have problems with loss of bowel control? N   Managing your Medications? N   Managing your Finances? N   Housekeeping or managing your Housekeeping? N     Patient Care Team: Rosan Dayton BROCKS, DO as PCP - General (Internal Medicine) William Gordy POUR, MD as Consulting Physician (Nephrology) William Eldora NOVAK, MD as Consulting Physician (Otolaryngology) Malmfelt, Delon CROME, RN as Oncology Nurse Navigator Izell Domino, MD as Consulting Physician (Radiation Oncology) Ladora, My [place], [place] as Referring Physician (Optometry)  I have updated your Care Teams any recent Medical Services you may have received from other providers in the past year.     Assessment:   This is a routine wellness examination for  [place].  Hearing/Vision screen Hearing Screening - Comments:: Adequate hearing, no hearing aids. Vision Screening - Comments:: Wears rx glasses - up to date with routine  eye exams with My Phebe Daniels, OD.    Goals Addressed               This Visit's Progress     11/27/2023 (pt-stated)        To maintain my health and keep watching cowboy movies.        Depression Screen     11/27/2023    1:32 PM 11/19/2023    2:04 PM 02/19/2023    3:57 PM 10/18/2022    8:41 AM 09/19/2022    8:40 AM 03/22/2022   11:17 AM 08/17/2021    3:47 PM  PHQ 2/9 Scores  PHQ - 2 Score 0 0 0 0 0 0 0  PHQ- 9 Score 1    0      Fall Risk     11/27/2023    1:35 PM 11/19/2023    2:03 PM 09/16/2023    1:19 PM 10/18/2022    8:41 AM 09/19/2022    8:33 AM  Fall Risk   Falls in the past year? 0 0 0 0 0  Number falls in past yr: 0 0 0 0 0  Injury with Fall? 0 0 0 0 0  Risk for fall due to : No Fall Risks No Fall Risks  No Fall Risks No Fall Risks  Follow up Falls evaluation completed Falls evaluation completed Falls evaluation completed Falls evaluation completed Falls prevention discussed    MEDICARE RISK AT HOME:  Medicare Risk at Home Any stairs in or around the home?: No If so, are there any without handrails?: No Home free of loose throw rugs in walkways, pet beds, electrical cords, etc?: Yes Adequate lighting in your home to reduce risk of falls?: Yes Life alert?: No Use of a cane, walker or w/c?: Yes Grab bars in the bathroom?: No Shower chair or bench in shower?: No Elevated toilet seat or a handicapped toilet?: No  TIMED UP AND GO:  Was the test performed?  No  Cognitive Function: 6CIT completed    11/27/2023    1:35 PM  MMSE - Mini Mental State Exam  Not completed: Unable to complete        11/27/2023    1:35 PM 09/19/2022    8:34 AM 05/19/2021    2:51 PM  6CIT Screen  What Year? 0 points 0 points 0 points  What month? 0 points 0 points 0 points  What time? 0 points 0 points 0 points   Count back from 20 0 points 0 points 0 points  Months in reverse 0 points 4 points 0 points  Repeat phrase 0 points 10 points 0 points  Total Score 0 points 14 points 0 points    Immunizations Immunization History  Administered Date(s) Administered   Influenza Whole 12/03/2008   PFIZER(Purple Top)SARS-COV-2 Vaccination 05/26/2019, 06/16/2019    Screening Tests Health Maintenance  Topic Date Due   DTaP/Tdap/Td (1 - Tdap) Never done   Pneumococcal Vaccine: 50+ Years (1 of 2 - PCV) Never done   Zoster Vaccines- Shingrix (1 of 2) Never done   OPHTHALMOLOGY EXAM  05/18/2016   COVID-19 Vaccine (3 - Pfizer risk series) 07/14/2019   Influenza Vaccine  10/04/2023   Diabetic kidney evaluation - Urine ACR  10/18/2023   FOOT EXAM  10/18/2023   Colonoscopy  06/12/2024 (Originally 01/28/2019)   HEMOGLOBIN A1C  03/18/2024   Lung Cancer Screening  10/23/2024   Diabetic kidney evaluation - eGFR measurement  11/18/2024   Medicare Annual Wellness (  AWV)  11/26/2024   Hepatitis C Screening  Completed   HPV VACCINES  Aged Out   Meningococcal B Vaccine  Aged Out    Health Maintenance Items Addressed: Yes Patient due for vaccines and eye exam.  Additional Screening:  Vision Screening: Recommended annual ophthalmology exams for early detection of glaucoma and other disorders of the eye. Is the patient up to date with their annual eye exam?  Yes  Who is the provider or what is the name of the office in which the patient attends annual eye exams? [facility], [place].  Dental Screening: Recommended annual dental exams for proper oral hygiene  Community Resource Referral / Chronic Care Management: CRR required this visit?  No   CCM required this visit?  No   Plan:    I have personally reviewed and noted the following in the patient's chart:   Medical and social history Use of alcohol, tobacco or illicit drugs  Current medications and supplements including opioid prescriptions. Patient is  currently taking opioid prescriptions. Information provided to patient regarding non-opioid alternatives. Patient advised to discuss non-opioid treatment plan with their provider. Functional ability and status Nutritional status Physical activity Advanced directives List of other physicians Hospitalizations, surgeries, and ER visits in previous 12 months Vitals Screenings to include cognitive, depression, and falls Referrals and appointments  In addition, I have reviewed and discussed with patient certain preventive protocols, quality metrics, and best practice recommendations. A written personalized care plan for preventive services as well as general preventive health recommendations were provided to patient.   Roz LOISE Fuller, LPN   [id number]   After Visit Summary: (MyChart) Due to this being a telephonic visit, the after visit summary with patients personalized plan was offered to patient via MyChart   Notes: Nothing significant to report at this time.

## 2023-11-27 NOTE — Patient Instructions (Signed)
 [name],  Thank you for taking the time for your Medicare Wellness Visit. I appreciate your continued commitment to your health goals. Please review the care plan we discussed, and feel free to reach out if I can assist you further.  Medicare recommends these wellness visits once per year to help you and your care team stay ahead of potential health issues. These visits are designed to focus on prevention, allowing your provider to concentrate on managing your acute and chronic conditions during your regular appointments.  Please note that Annual Wellness Visits do not include a physical exam. Some assessments may be limited, especially if the visit was conducted virtually. If needed, we may recommend a separate in-person follow-up with your provider.  Ongoing Care Seeing your primary care provider every 3 to 6 months helps us  monitor your health and provide consistent, personalized care.   Referrals If a referral was made during today's visit and you haven't received any updates within two weeks, please contact the referred provider directly to check on the status.  Recommended Screenings:  Health Maintenance  Topic Date Due   DTaP/Tdap/Td vaccine (1 - Tdap) Never done   Pneumococcal Vaccine for age over 78 (1 of 2 - PCV) Never done   Zoster (Shingles) Vaccine (1 of 2) Never done   Eye exam for diabetics  05/18/2016   COVID-19 Vaccine (3 - Pfizer risk series) 07/14/2019   Flu Shot  10/04/2023   Yearly kidney health urinalysis for diabetes  10/18/2023   Complete foot exam   10/18/2023   Colon Cancer Screening  06/12/2024*   Hemoglobin A1C  03/18/2024   Screening for Lung Cancer  10/23/2024   Yearly kidney function blood test for diabetes  11/18/2024   Medicare Annual Wellness Visit  11/26/2024   Hepatitis C Screening  Completed   HPV Vaccine  Aged Out   Meningitis B Vaccine  Aged Out  *Topic was postponed. The date shown is not the original due date.       11/27/2023    1:34  PM  Advanced Directives  Does Patient Have a Medical Advance Directive? No  Would patient like information on creating a medical advance directive? No - Patient declined   Advance Care Planning is important because it: Ensures you receive medical care that aligns with your values, goals, and preferences. Provides guidance to your family and loved ones, reducing the emotional burden of decision-making during critical moments.  Vision: Annual vision screenings are recommended for early detection of glaucoma, cataracts, and diabetic retinopathy. These exams can also reveal signs of chronic conditions such as diabetes and high blood pressure.  Dental: Annual dental screenings help detect early signs of oral cancer, gum disease, and other conditions linked to overall health, including heart disease and diabetes.  Please see the attached documents for additional preventive care recommendations.

## 2023-12-03 ENCOUNTER — Ambulatory Visit: Admission: RE | Admit: 2023-12-03 | Source: Ambulatory Visit | Admitting: Radiology

## 2023-12-06 ENCOUNTER — Encounter (HOSPITAL_COMMUNITY)
Admission: RE | Disposition: A | Discharge status: Home / Self Care | Payer: Self-pay | Source: Home / Self Care | Attending: Vascular Surgery

## 2023-12-06 ENCOUNTER — Ambulatory Visit (HOSPITAL_COMMUNITY)
Admission: RE | Admit: 2023-12-06 | Discharge: 2023-12-06 | Disposition: A | Discharge status: Home / Self Care | Attending: Vascular Surgery | Admitting: Vascular Surgery

## 2023-12-06 DIAGNOSIS — I7090 Unspecified atherosclerosis: Secondary | ICD-10-CM

## 2023-12-06 DIAGNOSIS — Z87891 Personal history of nicotine dependence: Secondary | ICD-10-CM | POA: Diagnosis not present

## 2023-12-06 DIAGNOSIS — I70222 Atherosclerosis of native arteries of extremities with rest pain, left leg: Secondary | ICD-10-CM

## 2023-12-06 DIAGNOSIS — Z7982 Long term (current) use of aspirin: Secondary | ICD-10-CM | POA: Insufficient documentation

## 2023-12-06 DIAGNOSIS — Z79899 Other long term (current) drug therapy: Secondary | ICD-10-CM | POA: Insufficient documentation

## 2023-12-06 HISTORY — PX: LOWER EXTREMITY ANGIOGRAPHY: CATH118251

## 2023-12-06 HISTORY — PX: ABDOMINAL AORTOGRAM: CATH118222

## 2023-12-06 LAB — POCT I-STAT, CHEM 8
BUN: 27 mg/dL — ABNORMAL HIGH (ref 8–23)
Calcium, Ion: 1.2 mmol/L (ref 1.15–1.40)
Chloride: 104 mmol/L (ref 98–111)
Creatinine, Ser: 2.4 mg/dL — ABNORMAL HIGH (ref 0.61–1.24)
Glucose, Bld: 124 mg/dL — ABNORMAL HIGH (ref 70–99)
HCT: 34 % — ABNORMAL LOW (ref 39.0–52.0)
Hemoglobin: 11.6 g/dL — ABNORMAL LOW (ref 13.0–17.0)
Potassium: 4.2 mmol/L (ref 3.5–5.1)
Sodium: 139 mmol/L (ref 135–145)
TCO2: 24 mmol/L (ref 22–32)

## 2023-12-06 SURGERY — ABDOMINAL AORTOGRAM
Anesthesia: LOCAL

## 2023-12-06 MED ORDER — SODIUM CHLORIDE 0.9% FLUSH: 3.0000 mL | Freq: Two times a day (BID) | INTRAVENOUS | Status: DC

## 2023-12-06 MED ORDER — ACETAMINOPHEN 325 MG PO TABS: 650.0000 mg | ORAL_TABLET | ORAL | Status: DC | PRN

## 2023-12-06 MED ORDER — MIDAZOLAM HCL 2 MG/2ML IJ SOLN
INTRAMUSCULAR | Status: AC
  Filled 2023-12-06: qty 2

## 2023-12-06 MED ORDER — LABETALOL HCL 5 MG/ML IV SOLN: 10.0000 mg | INTRAVENOUS | Status: DC | PRN

## 2023-12-06 MED ORDER — ONDANSETRON HCL 4 MG/2ML IJ SOLN: 4.0000 mg | Freq: Four times a day (QID) | INTRAMUSCULAR | Status: DC | PRN

## 2023-12-06 MED ORDER — ASPIRIN 81 MG PO TBEC: 81.0000 mg | DELAYED_RELEASE_TABLET | Freq: Every day | ORAL | Status: DC

## 2023-12-06 MED ORDER — FENTANYL CITRATE (PF) 100 MCG/2ML IJ SOLN
INTRAMUSCULAR | Status: AC
  Filled 2023-12-06: qty 2

## 2023-12-06 MED ORDER — LIDOCAINE HCL (PF) 1 % IJ SOLN
INTRAMUSCULAR | Status: DC | PRN
  Administered 2023-12-06: 10 mL via INTRADERMAL

## 2023-12-06 MED ORDER — HEPARIN (PORCINE) IN NACL 1000-0.9 UT/500ML-% IV SOLN
INTRAVENOUS | Status: DC | PRN
  Administered 2023-12-06: 1000 mL

## 2023-12-06 MED ORDER — FENTANYL CITRATE (PF) 100 MCG/2ML IJ SOLN
INTRAMUSCULAR | Status: DC | PRN
  Administered 2023-12-06: 50 ug via INTRAVENOUS

## 2023-12-06 MED ORDER — LIDOCAINE HCL (PF) 1 % IJ SOLN
INTRAMUSCULAR | Status: AC
  Filled 2023-12-06: qty 30

## 2023-12-06 MED ORDER — SODIUM CHLORIDE 0.9 % IV SOLN: 250.0000 mL | INTRAVENOUS | Status: DC | PRN

## 2023-12-06 MED ORDER — SODIUM CHLORIDE 0.9% FLUSH: 3.0000 mL | INTRAVENOUS | Status: DC | PRN

## 2023-12-06 MED ORDER — SODIUM CHLORIDE 0.9 % WEIGHT BASED INFUSION: 1.0000 mL/kg/h | INTRAVENOUS | Status: DC

## 2023-12-06 MED ORDER — HYDRALAZINE HCL 20 MG/ML IJ SOLN: 5.0000 mg | INTRAMUSCULAR | Status: DC | PRN

## 2023-12-06 MED ORDER — SODIUM CHLORIDE 0.9 % IV SOLN
INTRAVENOUS | Status: DC
Start: 2023-12-06 — End: 2023-12-06

## 2023-12-06 MED ORDER — MIDAZOLAM HCL 2 MG/2ML IJ SOLN
INTRAMUSCULAR | Status: DC | PRN
  Administered 2023-12-06: 1 mg via INTRAVENOUS

## 2023-12-06 MED ORDER — IODIXANOL 320 MG/ML IV SOLN
INTRAVENOUS | Status: DC | PRN
  Administered 2023-12-06: 20 mL

## 2023-12-06 SURGICAL SUPPLY — 10 items
CATH NAVICROSS ST 65CM (CATHETERS) IMPLANT
CATH OMNI FLUSH 5F 65CM (CATHETERS) IMPLANT
KIT ANGIASSIST CO2 SYSTEM (KITS) IMPLANT
KIT MICROPUNCTURE NIT STIFF (SHEATH) IMPLANT
KIT SINGLE USE MANIFOLD (KITS) IMPLANT
SET ATX-X65L (MISCELLANEOUS) IMPLANT
SHEATH PINNACLE 5F 10CM (SHEATH) IMPLANT
SHEATH PROBE COVER 6X72 (BAG) IMPLANT
TRAY PV CATH (CUSTOM PROCEDURE TRAY) ×2 IMPLANT
WIRE BENTSON .035X145CM (WIRE) IMPLANT

## 2023-12-06 NOTE — Discharge Instructions (Signed)
 Femoral Site Care This sheet gives you information about how to care for yourself after your procedure. Your health care provider may also give you more specific instructions. If you have problems or questions, contact your health care provider. What can I expect after the procedure?  After the procedure, it is common to have: Bruising that usually fades within 1-2 weeks. Tenderness at the site. Follow these instructions at home: Wound care Follow instructions from your health care provider about how to take care of your insertion site. Make sure you: Wash your hands with soap and water before you change your bandage (dressing). If soap and water are not available, use hand sanitizer. Remove your dressing as told by your health care provider. In 24 hours Do not take baths, swim, or use a hot tub until your health care provider approves. You may shower 24-48 hours after the procedure or as told by your health care provider. Gently wash the site with plain soap and water. Pat the area dry with a clean towel. Do not rub the site. This may cause bleeding. Do not apply powder or lotion to the site. Keep the site clean and dry. Check your femoral site every day for signs of infection. Check for: Redness, swelling, or pain. Fluid or blood. Warmth. Pus or a bad smell. Activity For the first 2-3 days after your procedure, or as long as directed: Avoid climbing stairs as much as possible. Do not squat. Do not lift anything that is heavier than 10 lb (4.5 kg), or the limit that you are told, until your health care provider says that it is safe. For 5 days Rest as directed. Avoid sitting for a long time without moving. Get up to take short walks every 1-2 hours. Do not drive for 24 hours if you were given a medicine to help you relax (sedative). General instructions Take over-the-counter and prescription medicines only as told by your health care provider. Keep all follow-up visits as told by  your health care provider. This is important. Contact a health care provider if you have: A fever or chills. You have redness, swelling, or pain around your insertion site. Get help right away if: The catheter insertion area swells very fast. You pass out. You suddenly start to sweat or your skin gets clammy. The catheter insertion area is bleeding, and the bleeding does not stop when you hold steady pressure on the area. The area near or just beyond the catheter insertion site becomes pale, cool, tingly, or numb. These symptoms may represent a serious problem that is an emergency. Do not wait to see if the symptoms will go away. Get medical help right away. Call your local emergency services (911 in the [place] Do not drive yourself to the hospital. Summary After the procedure, it is common to have bruising that usually fades within 1-2 weeks. Check your femoral site every day for signs of infection. Do not lift anything that is heavier than 10 lb (4.5 kg), or the limit that you are told, until your health care provider says that it is safe. This information is not intended to replace advice given to you by your health care provider. Make sure you discuss any questions you have with your health care provider. Document Revised: 03/04/2017 Document Reviewed: 03/04/2017 Elsevier Patient Education  2020 [employer].

## 2023-12-06 NOTE — Progress Notes (Signed)
 Discharge instructions reviewed with patient and his wife [name]  at bedside, denies questions or concerns. PT was able to ambulate in the hallway without difficulty. PT tolerated PO intake, was able to void clear yellow urine without difficulty. Incision site remained  C/D/I. PT was escorted from the unit via wheel chair to personal vehicle.

## 2023-12-06 NOTE — Interval H&P Note (Signed)
 History and Physical Interval Note:  12/06/2023 12:55 PM  William Perkins  has presented today for surgery, with the diagnosis of Athelorscherosis.  The various methods of treatment have been discussed with the patient and family. After consideration of risks, benefits and other options for treatment, the patient has consented to  Procedure(s): ABDOMINAL AORTOGRAM (N/A) Lower Extremity Angiography (N/A) as a surgical intervention.  The patient's history has been reviewed, patient examined, no change in status, stable for surgery.  I have reviewed the patient's chart and labs.  Questions were answered to the patient's satisfaction.     Debby LOISE Robertson

## 2023-12-06 NOTE — Op Note (Signed)
 DATE OF SERVICE: 12/06/2023  PATIENT:  William Perkins  73 y.o. male  PRE-OPERATIVE DIAGNOSIS:  Atherosclerosis of native arteries of left lower extremity causing rest pain  POST-OPERATIVE DIAGNOSIS:  Same  PROCEDURE:   1) Ultrasound guided right common femoral artery access (CPT [phone]) 2) Aortogram (CPT [id number]) 3) Left lower extremity angiogram with second order cannulation (CPT 36246) 4) Conscious sedation (28 minutes) (CPT 99152)  SURGEON:  Debby SAILOR. Magda, MD  ASSISTANT: none  ANESTHESIA:   local and IV sedation  ESTIMATED BLOOD LOSS: min  LOCAL MEDICATIONS USED:  LIDOCAINE    COUNTS: confirmed correct.  PATIENT DISPOSITION:  PACU - hemodynamically stable.   Delay start of Pharmacological VTE agent (>24hrs) due to surgical blood loss or risk of bleeding: no  INDICATION FOR PROCEDURE: William Perkins is a 73 y.o. male with left leg ischemic rest pain. After careful discussion of risks, benefits, and alternatives the patient was offered angiogram. The patient understood and wished to proceed.  OPERATIVE FINDINGS:   Left renal artery: patent Right renal artery: patent   Infrarenal aorta: patent  Left common iliac artery: patent Right common iliac artery: patent  Left internal iliac artery: patent Right internal iliac artery: patent  Left external iliac artery: patent Right external iliac artery: patent  Left common femoral artery: severe plaque extending into profunda and superficial femoral artery Right common femoral artery: not studied  Left profunda femoris artery: patent beyond origin Right profunda femoris artery: not studied  Left superficial femoral artery: patent, but diffusely diseased beyond origin Right superficial femoral artery: not studied  Left popliteal artery: heavily disease. Nearly occlusive plaque behind the knee and below the knee. Right popliteal artery: not studied  Left anterior tibial artery: occluded Right anterior tibial artery: not studied  Left  tibioperoneal trunk: possibly patent, but underfilled and diminutive Right tibioperoneal trunk: not studied  Left peroneal artery: possibly patent, but underfilled and diminutive Right peroneal artery: not studied  Left posterior tibial artery: occluded Right posterior tibial artery: not studied  Left pedal circulation: severely disadvantaged Right pedal circulation: not studied   DESCRIPTION OF PROCEDURE: After identification of the patient in the pre-operative holding area, the patient was transferred to the operating room. The patient was positioned supine on the operating room table. Anesthesia was induced. The groins was prepped and draped in standard fashion. A surgical pause was performed confirming correct patient, procedure, and operative location.  The right groin was anesthetized with subcutaneous injection of 1% lidocaine . Using ultrasound guidance, the right common femoral artery was accessed with micropuncture technique.  Fluoroscopy was used to confirm cannulation over the femoral head. The 30F micropuncture sheath was upsized to 25F.   A Benson wire was advanced into the distal aorta. Over the wire an omni flush catheter was advanced to the level of L2. Aortogram was performed - see above for details.   The left common iliac artery was selected with an omniflush catheter and benson guidewire. The wire was advanced into the common femoral artery. Over the wire the omni flush catheter was advanced into the external iliac artery. Selective angiography was performed - see above for details.   The sheath was left in place to be removed in the recovery area.  Conscious sedation was administered with the use of IV fentanyl  and midazolam  under continuous physician and nurse monitoring.  Heart rate, blood pressure, and oxygen saturation were continuously monitored.  Total sedation time was 28 minutes  Upon completion of the case instrument and  sharps counts were confirmed correct. The patient  was transferred to the PACU in good condition. I was present for all portions of the procedure.  PLAN: ASA / statin. Plan extended L femoral endarterectomy onto profunda and SFA. No distal bypass target seen.   Debby SAILOR. Magda, MD [facility] Phone Number: [phone] 12/06/2023 1:51 PM

## 2023-12-09 ENCOUNTER — Other Ambulatory Visit: Payer: Self-pay

## 2023-12-09 ENCOUNTER — Telehealth: Payer: Self-pay | Admitting: Radiology

## 2023-12-09 ENCOUNTER — Encounter (HOSPITAL_COMMUNITY): Payer: Self-pay | Admitting: Vascular Surgery

## 2023-12-09 DIAGNOSIS — I70222 Atherosclerosis of native arteries of extremities with rest pain, left leg: Secondary | ICD-10-CM

## 2023-12-09 NOTE — Telephone Encounter (Signed)
 Pt called asking to cx appt at this time. He advised he is awaiting call from [his] other doctor to tell him when to head to hospital due to blood clots in [his] legs that need to be taken care of. He's not sure how long he will be admitted for but agreed to c/b once discharged to r/s f/u appt.

## 2023-12-10 ENCOUNTER — Encounter (HOSPITAL_COMMUNITY): Payer: Self-pay | Admitting: Vascular Surgery

## 2023-12-10 ENCOUNTER — Ambulatory Visit: Admitting: Radiology

## 2023-12-10 NOTE — Anesthesia Preprocedure Evaluation (Signed)
 Anesthesia Evaluation  Patient identified by MRN, date of birth, ID band Patient awake    Reviewed: Allergy & Precautions, NPO status , Patient's Chart, lab work & pertinent test results  History of Anesthesia Complications Negative for: history of anesthetic complications  Airway Mallampati: I  TM Distance: >3 FB Neck ROM: Full   Comment: Throat Cx s/p XRT - Full/Good ROM Dental  (+) Edentulous Upper, Edentulous Lower, Dental Advisory Given   Pulmonary COPD, neg recent URI, former smoker   breath sounds clear to auscultation       Cardiovascular hypertension, + Peripheral Vascular Disease   Rhythm:Regular Rate:Normal     Neuro/Psych    GI/Hepatic ,GERD  ,,  Endo/Other  diabetes, Type 2, Oral Hypoglycemic Agents    Renal/GU Renal InsufficiencyRenal disease (Baseline Cr 2.4-2.5)     Musculoskeletal Hx of Throat cancer s/p radiation therapy   Abdominal   Peds  Hematology  (+) Blood dyscrasia, anemia   Anesthesia Other Findings   Reproductive/Obstetrics                              Anesthesia Physical Anesthesia Plan  ASA: 3  Anesthesia Plan: General   Post-op Pain Management:    Induction: Intravenous  PONV Risk Score and Plan: 1 and Ondansetron   Airway Management Planned: Oral ETT  Additional Equipment: Arterial line  Intra-op Plan:   Post-operative Plan: Extubation in OR  Informed Consent:      Dental advisory given  Plan Discussed with:   Anesthesia Plan Comments: (PAT note by Lynwood Hope, PA-C: 73 year old male follows with vascular surgery for history of PAD s/p right extended femoral endarterectomy 2022, asymptomatic right subclavian artery stenosis.  Recently seen by Dr. Magda with complaint of rest pain in the LLE.  Subsequent angiogram 12/06/2023 showed significant stenosis and extended left femoral endarterectomy was recommended.  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  Noted to be doing well when seen by Dr. Tobie on 07/08/2023.  In office laryngoscopy at that time showed, The nasal cavity and nasopharynx did not reveal any masses or lesions, mucosa appeared to be without obvious lesions. The tongue base, pharyngeal walls, piriform sinuses, vallecula, epiglottis and postcricoid region are normal in appearance without masses; The visualized portion of the subglottis and proximal trachea is widely patent. The vocal folds are mobile bilaterally - resolution of prior mass, with no mucosal changes seen today concerning for recurrence (slight erythema right VF); left vocal fold without lesions.  Other pertinent history includes former smoker (44 pack years, quit 2015), GERD, CKD 4.  He will need day of surgery labs and evaluation.  EKG 01/24/2023: Sinus rhythm with 1st degree A-V block with occasional Premature ventricular complexes.  Rate 69. Left axis deviation  Carotid duplex 09/18/2023: Summary:  Right Carotid: Velocities in the right ICA are consistent with a 40-59% stenosis. The ECA appears <50% stenosed.  Left Carotid: Velocities in the left ICA are consistent with a 1-39% stenosis. The ECA appears <50% stenosed.  Vertebrals: Bilateral vertebral arteries demonstrate antegrade flow.   )         Anesthesia Quick Evaluation

## 2023-12-10 NOTE — Progress Notes (Signed)
 SDW CALL  Patient was given pre-op instructions over the phone. The opportunity was given for the patient to ask questions. No further questions asked. Patient verbalized understanding of instructions given.   PCP - Hoffman, Erik C, DO Cardiologist - denies  PPM/ICD - denies Device Orders -  Rep Notified -   Chest x-ray - CT-11/05/23 EKG - 01/24/23 Stress Test - dnies ECHO - 03/18/06 Cardiac Cath - denies PFT - 10/03/12  Sleep Study - denies CPAP - no  Fasting Blood Sugar - na Checks Blood Sugar _____ times a day  Blood Thinner Instructions:na Aspirin  Instructions:continue per Dr. Cleda instructions  ERAS Protcol -NPO PRE-SURGERY Ensure or G2-   COVID TEST- na   Anesthesia review: yes- hx HTN, HLD ,laryngeal CA  Patient denies shortness of breath, fever, cough and chest pain over the phone call    Special instructions:    Oral Hygiene is also important to reduce your risk of infection.  Remember - BRUSH YOUR TEETH THE MORNING OF SURGERY WITH YOUR REGULAR TOOTHPASTE

## 2023-12-10 NOTE — Progress Notes (Signed)
 Anesthesia Chart Review: Same day workup  73 year old male follows with vascular surgery for history of PAD s/p right extended femoral endarterectomy 2022, asymptomatic right subclavian artery stenosis.  Recently seen by Dr. Magda with complaint of rest pain in the LLE.  Subsequent angiogram 12/06/2023 showed significant stenosis and extended left femoral endarterectomy was recommended.  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  Noted to be doing well when seen by Dr. Tobie on 07/08/2023.  In office laryngoscopy at that time showed,  The nasal cavity and nasopharynx did not reveal any masses or lesions, mucosa appeared to be without obvious lesions. The tongue base, pharyngeal walls, piriform sinuses, vallecula, epiglottis and postcricoid region are normal in appearance without masses; The visualized portion of the subglottis and proximal trachea is widely patent. The vocal folds are mobile bilaterally - resolution of prior mass, with no mucosal changes seen today concerning for recurrence (slight erythema right VF); left vocal fold without lesions.  Other pertinent history includes former smoker (44 pack years, quit 2015), GERD, CKD 4.  He will need day of surgery labs and evaluation.  EKG 01/24/2023: Sinus rhythm with 1st degree A-V block with occasional Premature ventricular complexes.  Rate 69. Left axis deviation  Carotid duplex 09/18/2023: Summary:  Right Carotid: Velocities in the right ICA are consistent with a 40-59% stenosis. The ECA appears <50% stenosed.  Left Carotid: Velocities in the left ICA are consistent with a 1-39% stenosis. The ECA appears <50% stenosed.  Vertebrals: Bilateral vertebral arteries demonstrate antegrade flow.        Lynwood Geofm RIGGERS [facility] Short Stay Center/Anesthesiology Phone [phone] 12/10/2023 11:50 AM

## 2023-12-11 ENCOUNTER — Encounter (HOSPITAL_COMMUNITY): Admission: RE | Disposition: A | Payer: Self-pay | Source: Home / Self Care | Attending: Vascular Surgery

## 2023-12-11 ENCOUNTER — Inpatient Hospital Stay (HOSPITAL_COMMUNITY)
Admission: RE | Admit: 2023-12-11 | Discharge: 2023-12-12 | DRG: 253 | Disposition: A | Discharge status: Home / Self Care | Attending: Vascular Surgery | Admitting: Vascular Surgery

## 2023-12-11 ENCOUNTER — Other Ambulatory Visit: Payer: Self-pay

## 2023-12-11 ENCOUNTER — Inpatient Hospital Stay (HOSPITAL_COMMUNITY): Admitting: Physician Assistant

## 2023-12-11 ENCOUNTER — Encounter (HOSPITAL_COMMUNITY): Payer: Self-pay | Admitting: Vascular Surgery

## 2023-12-11 DIAGNOSIS — I129 Hypertensive chronic kidney disease with stage 1 through stage 4 chronic kidney disease, or unspecified chronic kidney disease: Secondary | ICD-10-CM | POA: Diagnosis not present

## 2023-12-11 DIAGNOSIS — Z8521 Personal history of malignant neoplasm of larynx: Secondary | ICD-10-CM | POA: Diagnosis not present

## 2023-12-11 DIAGNOSIS — Z7982 Long term (current) use of aspirin: Secondary | ICD-10-CM | POA: Diagnosis not present

## 2023-12-11 DIAGNOSIS — I70222 Atherosclerosis of native arteries of extremities with rest pain, left leg: Secondary | ICD-10-CM

## 2023-12-11 DIAGNOSIS — Z833 Family history of diabetes mellitus: Secondary | ICD-10-CM

## 2023-12-11 DIAGNOSIS — N184 Chronic kidney disease, stage 4 (severe): Secondary | ICD-10-CM | POA: Diagnosis not present

## 2023-12-11 DIAGNOSIS — R110 Nausea: Secondary | ICD-10-CM | POA: Diagnosis not present

## 2023-12-11 DIAGNOSIS — Z79899 Other long term (current) drug therapy: Secondary | ICD-10-CM

## 2023-12-11 DIAGNOSIS — Z87891 Personal history of nicotine dependence: Secondary | ICD-10-CM | POA: Diagnosis not present

## 2023-12-11 DIAGNOSIS — I10 Essential (primary) hypertension: Secondary | ICD-10-CM | POA: Diagnosis not present

## 2023-12-11 DIAGNOSIS — Z923 Personal history of irradiation: Secondary | ICD-10-CM | POA: Diagnosis not present

## 2023-12-11 DIAGNOSIS — K219 Gastro-esophageal reflux disease without esophagitis: Secondary | ICD-10-CM | POA: Diagnosis present

## 2023-12-11 DIAGNOSIS — E785 Hyperlipidemia, unspecified: Secondary | ICD-10-CM | POA: Diagnosis not present

## 2023-12-11 DIAGNOSIS — E1122 Type 2 diabetes mellitus with diabetic chronic kidney disease: Secondary | ICD-10-CM | POA: Diagnosis not present

## 2023-12-11 DIAGNOSIS — Z8249 Family history of ischemic heart disease and other diseases of the circulatory system: Secondary | ICD-10-CM | POA: Diagnosis not present

## 2023-12-11 DIAGNOSIS — J449 Chronic obstructive pulmonary disease, unspecified: Secondary | ICD-10-CM

## 2023-12-11 HISTORY — PX: ENDARTERECTOMY FEMORAL: SHX5804

## 2023-12-11 LAB — CBC
HCT: 34.3 % — ABNORMAL LOW (ref 39.0–52.0)
Hemoglobin: 10.8 g/dL — ABNORMAL LOW (ref 13.0–17.0)
MCH: 29.8 pg (ref 26.0–34.0)
MCHC: 31.5 g/dL (ref 30.0–36.0)
MCV: 94.8 fL (ref 80.0–100.0)
Platelets: 252 K/uL (ref 150–400)
RBC: 3.62 MIL/uL — ABNORMAL LOW (ref 4.22–5.81)
RDW: 13.4 % (ref 11.5–15.5)
WBC: 5.5 K/uL (ref 4.0–10.5)
nRBC: 0 % (ref 0.0–0.2)

## 2023-12-11 LAB — APTT: aPTT: 31 s (ref 24–36)

## 2023-12-11 LAB — COMPREHENSIVE METABOLIC PANEL WITH GFR
ALT: 10 U/L (ref 0–44)
AST: 26 U/L (ref 15–41)
Albumin: 3.3 g/dL — ABNORMAL LOW (ref 3.5–5.0)
Alkaline Phosphatase: 73 U/L (ref 38–126)
Anion gap: 11 (ref 5–15)
BUN: 21 mg/dL (ref 8–23)
CO2: 23 mmol/L (ref 22–32)
Calcium: 9.1 mg/dL (ref 8.9–10.3)
Chloride: 101 mmol/L (ref 98–111)
Creatinine, Ser: 2.35 mg/dL — ABNORMAL HIGH (ref 0.61–1.24)
GFR, Estimated: 29 mL/min — ABNORMAL LOW (ref >60)
Glucose, Bld: 129 mg/dL — ABNORMAL HIGH (ref 70–99)
Potassium: 4.1 mmol/L (ref 3.5–5.1)
Sodium: 135 mmol/L (ref 135–145)
Total Bilirubin: 0.7 mg/dL (ref 0.0–1.2)
Total Protein: 6.5 g/dL (ref 6.5–8.1)

## 2023-12-11 LAB — PROTIME-INR
INR: 1.2 (ref 0.8–1.2)
Prothrombin Time: 15.4 s — ABNORMAL HIGH (ref 11.4–15.2)

## 2023-12-11 LAB — SURGICAL PCR SCREEN
MRSA, PCR: NEGATIVE
Staphylococcus aureus: NEGATIVE

## 2023-12-11 LAB — TYPE AND SCREEN
ABO/RH(D): B POS
Antibody Screen: NEGATIVE

## 2023-12-11 LAB — POCT ACTIVATED CLOTTING TIME: Activated Clotting Time: 245 s

## 2023-12-11 SURGERY — ENDARTERECTOMY, FEMORAL
Anesthesia: General | Laterality: Left

## 2023-12-11 MED ORDER — LABETALOL HCL 5 MG/ML IV SOLN: 10.0000 mg | INTRAVENOUS | Status: DC | PRN

## 2023-12-11 MED ORDER — PHENYLEPHRINE HCL-NACL 20-0.9 MG/250ML-% IV SOLN
INTRAVENOUS | Status: DC | PRN
  Administered 2023-12-11: 50 ug/min via INTRAVENOUS

## 2023-12-11 MED ORDER — HEPARIN 6000 UNIT IRRIGATION SOLUTION
Status: DC | PRN
  Administered 2023-12-11: 1

## 2023-12-11 MED ORDER — SUGAMMADEX SODIUM 200 MG/2ML IV SOLN
INTRAVENOUS | Status: DC | PRN
  Administered 2023-12-11: 120.6 mg via INTRAVENOUS

## 2023-12-11 MED ORDER — PROTAMINE SULFATE 10 MG/ML IV SOLN
INTRAVENOUS | Status: DC | PRN
  Administered 2023-12-11: 50 mg via INTRAVENOUS

## 2023-12-11 MED ORDER — OXYCODONE HCL 5 MG PO TABS
5.0000 mg | ORAL_TABLET | ORAL | Status: DC | PRN
  Administered 2023-12-11 – 2023-12-12 (×3): 5 mg via ORAL
  Filled 2023-12-11: qty 1
  Filled 2023-12-11 (×2): qty 2

## 2023-12-11 MED ORDER — ASPIRIN 81 MG PO TBEC
81.0000 mg | DELAYED_RELEASE_TABLET | Freq: Every day | ORAL | Status: DC
  Administered 2023-12-12: 81 mg via ORAL
  Filled 2023-12-11: qty 1

## 2023-12-11 MED ORDER — CHLORHEXIDINE GLUCONATE 0.12 % MT SOLN
OROMUCOSAL | Status: AC
  Administered 2023-12-11: 15 mL via OROMUCOSAL
  Filled 2023-12-11: qty 15

## 2023-12-11 MED ORDER — HEPARIN 6000 UNIT IRRIGATION SOLUTION
Status: AC
  Filled 2023-12-11: qty 500

## 2023-12-11 MED ORDER — PHENYLEPHRINE 80 MCG/ML (10ML) SYRINGE FOR IV PUSH (FOR BLOOD PRESSURE SUPPORT)
PREFILLED_SYRINGE | INTRAVENOUS | Status: DC | PRN
  Administered 2023-12-11: 160 ug via INTRAVENOUS

## 2023-12-11 MED ORDER — ACETAMINOPHEN 10 MG/ML IV SOLN: 1000.0000 mg | Freq: Once | INTRAVENOUS | Status: DC | PRN

## 2023-12-11 MED ORDER — OXYCODONE HCL 5 MG PO TABS: 5.0000 mg | ORAL_TABLET | Freq: Once | ORAL | Status: DC | PRN

## 2023-12-11 MED ORDER — HEMOSTATIC AGENTS (NO CHARGE) OPTIME
TOPICAL | Status: DC | PRN
Start: 2023-12-11 — End: 2023-12-11
  Administered 2023-12-11: 2 via TOPICAL

## 2023-12-11 MED ORDER — PROPOFOL 10 MG/ML IV BOLUS
INTRAVENOUS | Status: DC | PRN
Start: 2023-12-11 — End: 2023-12-11
  Administered 2023-12-11: 25 mg via INTRAVENOUS
  Administered 2023-12-11: 30 mg via INTRAVENOUS
  Administered 2023-12-11: 20 mg via INTRAVENOUS
  Administered 2023-12-11: 60 mg via INTRAVENOUS

## 2023-12-11 MED ORDER — FENTANYL CITRATE (PF) 100 MCG/2ML IJ SOLN
INTRAMUSCULAR | Status: AC
  Filled 2023-12-11: qty 2

## 2023-12-11 MED ORDER — CHLORHEXIDINE GLUCONATE CLOTH 2 % EX PADS: 6.0000 | MEDICATED_PAD | Freq: Once | CUTANEOUS | Status: DC

## 2023-12-11 MED ORDER — PHENOL 1.4 % MT LIQD: 1.0000 | OROMUCOSAL | Status: DC | PRN

## 2023-12-11 MED ORDER — LACTATED RINGERS IV SOLN: INTRAVENOUS | Status: DC

## 2023-12-11 MED ORDER — GLYCOPYRROLATE 0.2 MG/ML IJ SOLN
INTRAMUSCULAR | Status: DC | PRN
  Administered 2023-12-11: .2 mg via INTRAVENOUS

## 2023-12-11 MED ORDER — LIDOCAINE 2% (20 MG/ML) 5 ML SYRINGE
INTRAMUSCULAR | Status: DC | PRN
  Administered 2023-12-11: 80 mg via INTRAVENOUS

## 2023-12-11 MED ORDER — ACETAMINOPHEN 325 MG RE SUPP: 325.0000 mg | RECTAL | Status: DC | PRN

## 2023-12-11 MED ORDER — CHLORHEXIDINE GLUCONATE 0.12 % MT SOLN: 15.0000 mL | OROMUCOSAL | Status: AC

## 2023-12-11 MED ORDER — ATORVASTATIN CALCIUM 80 MG PO TABS
80.0000 mg | ORAL_TABLET | Freq: Every day | ORAL | Status: DC
  Administered 2023-12-11 – 2023-12-12 (×2): 80 mg via ORAL
  Filled 2023-12-11 (×2): qty 1

## 2023-12-11 MED ORDER — EPHEDRINE SULFATE-NACL 50-0.9 MG/10ML-% IV SOSY: PREFILLED_SYRINGE | INTRAVENOUS | Status: DC | PRN

## 2023-12-11 MED ORDER — HEPARIN SODIUM (PORCINE) 1000 UNIT/ML IJ SOLN
INTRAMUSCULAR | Status: DC | PRN
  Administered 2023-12-11: 2000 [IU] via INTRAVENOUS
  Administered 2023-12-11: 6000 [IU] via INTRAVENOUS

## 2023-12-11 MED ORDER — HYDRALAZINE HCL 20 MG/ML IJ SOLN: 5.0000 mg | INTRAMUSCULAR | Status: DC | PRN

## 2023-12-11 MED ORDER — ONDANSETRON HCL 4 MG/2ML IJ SOLN
4.0000 mg | Freq: Four times a day (QID) | INTRAMUSCULAR | Status: DC | PRN
  Administered 2023-12-11 – 2023-12-12 (×2): 4 mg via INTRAVENOUS
  Filled 2023-12-11 (×2): qty 2

## 2023-12-11 MED ORDER — MORPHINE SULFATE (PF) 2 MG/ML IV SOLN: 2.0000 mg | INTRAVENOUS | Status: DC | PRN

## 2023-12-11 MED ORDER — FENTANYL CITRATE (PF) 250 MCG/5ML IJ SOLN
INTRAMUSCULAR | Status: DC | PRN
  Administered 2023-12-11 (×2): 50 ug via INTRAVENOUS

## 2023-12-11 MED ORDER — FENTANYL CITRATE (PF) 100 MCG/2ML IJ SOLN
25.0000 ug | INTRAMUSCULAR | Status: DC | PRN
  Administered 2023-12-11: 50 ug via INTRAVENOUS

## 2023-12-11 MED ORDER — AMLODIPINE BESYLATE 10 MG PO TABS
10.0000 mg | ORAL_TABLET | Freq: Every day | ORAL | Status: DC
Start: 2023-12-12 — End: 2023-12-12
  Administered 2023-12-12: 10 mg via ORAL
  Filled 2023-12-11: qty 1

## 2023-12-11 MED ORDER — METOPROLOL TARTRATE 5 MG/5ML IV SOLN: 2.5000 mg | INTRAVENOUS | Status: DC | PRN

## 2023-12-11 MED ORDER — ALBUMIN HUMAN 5 % IV SOLN: INTRAVENOUS | Status: DC | PRN

## 2023-12-11 MED ORDER — DROPERIDOL 2.5 MG/ML IJ SOLN: 0.6250 mg | Freq: Once | INTRAMUSCULAR | Status: DC | PRN

## 2023-12-11 MED ORDER — ONDANSETRON HCL 4 MG/2ML IJ SOLN
INTRAMUSCULAR | Status: DC | PRN
Start: 2023-12-11 — End: 2023-12-11
  Administered 2023-12-11: 4 mg via INTRAVENOUS

## 2023-12-11 MED ORDER — SODIUM CHLORIDE 0.9 % IV SOLN: 500.0000 mL | Freq: Once | INTRAVENOUS | Status: DC | PRN

## 2023-12-11 MED ORDER — CEFAZOLIN SODIUM-DEXTROSE 2-4 GM/100ML-% IV SOLN
2.0000 g | Freq: Three times a day (TID) | INTRAVENOUS | Status: AC
  Administered 2023-12-11 – 2023-12-12 (×2): 2 g via INTRAVENOUS
  Filled 2023-12-11 (×2): qty 100

## 2023-12-11 MED ORDER — DOCUSATE SODIUM 100 MG PO CAPS
100.0000 mg | ORAL_CAPSULE | Freq: Every day | ORAL | Status: DC
  Administered 2023-12-12: 100 mg via ORAL
  Filled 2023-12-11: qty 1

## 2023-12-11 MED ORDER — SODIUM CHLORIDE 0.45 % IV SOLN: INTRAVENOUS | Status: DC

## 2023-12-11 MED ORDER — CEFAZOLIN SODIUM-DEXTROSE 2-4 GM/100ML-% IV SOLN
2.0000 g | INTRAVENOUS | Status: AC
  Administered 2023-12-11: 2 g via INTRAVENOUS

## 2023-12-11 MED ORDER — PROPOFOL 500 MG/50ML IV EMUL
INTRAVENOUS | Status: DC | PRN
Start: 2023-12-11 — End: 2023-12-11
  Administered 2023-12-11: 50 ug/kg/min via INTRAVENOUS

## 2023-12-11 MED ORDER — CLONIDINE HCL 0.1 MG PO TABS
0.1000 mg | ORAL_TABLET | Freq: Two times a day (BID) | ORAL | Status: DC
  Administered 2023-12-11 – 2023-12-12 (×3): 0.1 mg via ORAL
  Filled 2023-12-11 (×3): qty 1

## 2023-12-11 MED ORDER — POLYETHYLENE GLYCOL 3350 17 G PO PACK
17.0000 g | PACK | Freq: Two times a day (BID) | ORAL | Status: DC
  Administered 2023-12-11 – 2023-12-12 (×2): 17 g via ORAL
  Filled 2023-12-11 (×2): qty 1

## 2023-12-11 MED ORDER — CLEVIDIPINE BUTYRATE 0.5 MG/ML IV EMUL
INTRAVENOUS | Status: DC | PRN
Start: 2023-12-11 — End: 2023-12-11
  Administered 2023-12-11: 3 mg/h via INTRAVENOUS

## 2023-12-11 MED ORDER — 0.9 % SODIUM CHLORIDE (POUR BTL) OPTIME
TOPICAL | Status: DC | PRN
  Administered 2023-12-11: 2000 mL

## 2023-12-11 MED ORDER — ACETAMINOPHEN 325 MG PO TABS: 325.0000 mg | ORAL_TABLET | ORAL | Status: DC | PRN

## 2023-12-11 MED ORDER — FENTANYL CITRATE (PF) 250 MCG/5ML IJ SOLN
INTRAMUSCULAR | Status: AC
  Filled 2023-12-11: qty 5

## 2023-12-11 MED ORDER — OXYCODONE HCL 5 MG/5ML PO SOLN: 5.0000 mg | Freq: Once | ORAL | Status: DC | PRN

## 2023-12-11 MED ORDER — POTASSIUM CHLORIDE CRYS ER 20 MEQ PO TBCR: 40.0000 meq | EXTENDED_RELEASE_TABLET | Freq: Every day | ORAL | Status: DC | PRN

## 2023-12-11 MED ORDER — CEFAZOLIN SODIUM-DEXTROSE 2-4 GM/100ML-% IV SOLN
INTRAVENOUS | Status: AC
  Filled 2023-12-11: qty 100

## 2023-12-11 MED ORDER — LISINOPRIL 20 MG PO TABS
40.0000 mg | ORAL_TABLET | Freq: Every day | ORAL | Status: DC
  Administered 2023-12-11 – 2023-12-12 (×2): 40 mg via ORAL
  Filled 2023-12-11 (×2): qty 2

## 2023-12-11 MED ORDER — ROCURONIUM BROMIDE 10 MG/ML (PF) SYRINGE
PREFILLED_SYRINGE | INTRAVENOUS | Status: DC | PRN
  Administered 2023-12-11: 50 mg via INTRAVENOUS
  Administered 2023-12-11: 30 mg via INTRAVENOUS

## 2023-12-11 MED ORDER — ONDANSETRON HCL 4 MG/2ML IJ SOLN: 4.0000 mg | Freq: Once | INTRAMUSCULAR | Status: DC | PRN

## 2023-12-11 MED ORDER — SODIUM CHLORIDE 0.9 % IV SOLN: INTRAVENOUS | Status: DC

## 2023-12-11 MED ORDER — FAMOTIDINE 40 MG/5ML PO SUSR
20.0000 mg | Freq: Every evening | ORAL | Status: DC | PRN
  Administered 2023-12-11: 20 mg via ORAL
  Filled 2023-12-11 (×2): qty 2.5

## 2023-12-11 MED ORDER — HEPARIN SODIUM (PORCINE) 5000 UNIT/ML IJ SOLN
5000.0000 [IU] | Freq: Three times a day (TID) | INTRAMUSCULAR | Status: DC
  Administered 2023-12-12: 5000 [IU] via SUBCUTANEOUS
  Filled 2023-12-11: qty 1

## 2023-12-11 SURGICAL SUPPLY — 39 items
BAG COUNTER SPONGE SURGICOUNT (BAG) ×2 IMPLANT
BENZOIN TINCTURE PRP APPL 2/3 (GAUZE/BANDAGES/DRESSINGS) ×2 IMPLANT
CANISTER SUCTION 3000ML PPV (SUCTIONS) ×2 IMPLANT
CANNULA VESSEL 3MM 2 BLNT TIP (CANNULA) ×4 IMPLANT
CHLORAPREP W/TINT 26 (MISCELLANEOUS) ×2 IMPLANT
CLIP LIGATING EXTRA MED SLVR (CLIP) IMPLANT
CLIP LIGATING EXTRA SM BLUE (MISCELLANEOUS) IMPLANT
CLSR STERI-STRIP ANTIMIC 1/2X4 (GAUZE/BANDAGES/DRESSINGS) IMPLANT
COVER PROBE W GEL 5X96 (DRAPES) IMPLANT
DRSG TEGADERM 4X10 (GAUZE/BANDAGES/DRESSINGS) IMPLANT
DRSG TEGADERM 4X4.75 (GAUZE/BANDAGES/DRESSINGS) IMPLANT
ELECTRODE REM PT RTRN 9FT ADLT (ELECTROSURGICAL) ×2 IMPLANT
EVACUATOR SILICONE 100CC (DRAIN) IMPLANT
GAUZE SPONGE 4X4 12PLY STRL (GAUZE/BANDAGES/DRESSINGS) ×2 IMPLANT
GLOVE BIO SURGEON STRL SZ8 (GLOVE) ×2 IMPLANT
GOWN STRL REUS W/ TWL LRG LVL3 (GOWN DISPOSABLE) ×4 IMPLANT
GOWN STRL REUS W/ TWL XL LVL3 (GOWN DISPOSABLE) ×2 IMPLANT
GRAFT VASC PATCH XENOSURE 1X14 (Vascular Products) IMPLANT
HEMOSTAT SNOW SURGICEL 2X4 (HEMOSTASIS) IMPLANT
KIT BASIN OR (CUSTOM PROCEDURE TRAY) ×2 IMPLANT
KIT TURNOVER KIT B (KITS) ×2 IMPLANT
LOOP VESSEL MINI RED (MISCELLANEOUS) IMPLANT
PACK PERIPHERAL VASCULAR (CUSTOM PROCEDURE TRAY) ×2 IMPLANT
PAD ARMBOARD POSITIONER FOAM (MISCELLANEOUS) ×4 IMPLANT
SET WALTER ACTIVATION W/DRAPE (SET/KITS/TRAYS/PACK) ×2 IMPLANT
SOLN 0.9% NACL 1000 ML (IV SOLUTION) ×2 IMPLANT
SOLN 0.9% NACL POUR BTL 1000ML (IV SOLUTION) ×4 IMPLANT
SOLN STERILE WATER 1000 ML (IV SOLUTION) ×1 IMPLANT
SOLN STERILE WATER BTL 1000 ML (IV SOLUTION) ×2 IMPLANT
STRIP CLOSURE SKIN 1/2X4 (GAUZE/BANDAGES/DRESSINGS) ×2 IMPLANT
SUT MNCRL AB 4-0 PS2 18 (SUTURE) ×2 IMPLANT
SUT PROLENE 5 0 C 1 24 (SUTURE) ×2 IMPLANT
SUT PROLENE 6 0 BV (SUTURE) ×2 IMPLANT
SUT SILK 3-0 18XBRD TIE 12 (SUTURE) IMPLANT
SUT VIC AB 2-0 CT1 TAPERPNT 27 (SUTURE) ×2 IMPLANT
SUT VIC AB 3-0 SH 27X BRD (SUTURE) ×2 IMPLANT
TOWEL GREEN STERILE (TOWEL DISPOSABLE) ×4 IMPLANT
TOWEL GREEN STERILE FF (TOWEL DISPOSABLE) ×2 IMPLANT
UNDERPAD 30X36 HEAVY ABSORB (UNDERPADS AND DIAPERS) ×2 IMPLANT

## 2023-12-11 NOTE — Progress Notes (Signed)
Pt arrived from ...PACU.., A/ox ..4.pt denies any pain, MD aware,CCMD called. CHG bath given,no further needs at this time    

## 2023-12-11 NOTE — Transfer of Care (Signed)
 Immediate Anesthesia Transfer of Care Note  Patient: William Perkins  Procedure(s) Performed: ENDARTERECTOMY, FEMORAL (Left)  Patient Location: PACU  Anesthesia Type:General  Level of Consciousness: awake, alert , and oriented  Airway & Oxygen Therapy: Patient Spontanous Breathing  Post-op Assessment: Report given to RN and Post -op Vital signs reviewed and stable  Post vital signs: Reviewed and stable  Last Vitals:  Vitals Value Taken Time  BP 99/73 12/11/23 13:15  Temp 98   Pulse 80 12/11/23 13:19  Resp 14 12/11/23 13:19  SpO2 99 % 12/11/23 13:19  Vitals shown include unfiled device data.  Last Pain:  Vitals:   12/11/23 0959  TempSrc:   PainSc: 0-No pain      Patients Stated Pain Goal: 0 (12/11/23 0959)  Complications: No notable events documented.

## 2023-12-11 NOTE — Anesthesia Procedure Notes (Signed)
 Arterial Line Insertion Start/End10/10/2023 10:50 AM, 12/11/2023 10:55 AM Performed by: Zelphia Norleen HERO, CRNA, CRNA  Patient location: Pre-op. Preanesthetic checklist: patient identified, IV checked, site marked, risks and benefits discussed, surgical consent, monitors and equipment checked, pre-op evaluation, timeout performed and anesthesia consent Lidocaine  1% used for infiltration Left, radial was placed Catheter size: 20 G Hand hygiene performed  and maximum sterile barriers used   Attempts: 1 Procedure performed using ultrasound to evaluate access site. Ultrasound Notes:relevant anatomy identified, ultrasound used to visualize needle entry, vessel patent under ultrasound and image(s) printed for medical record. Following insertion, dressing applied and Biopatch. Post procedure assessment: normal and unchanged  Patient tolerated the procedure well with no immediate complications.

## 2023-12-11 NOTE — Op Note (Addendum)
 DATE OF SERVICE: 12/11/2023  PATIENT:  William Perkins  73 y.o. male  PRE-OPERATIVE DIAGNOSIS:  atherosclerosis of native arteries of left lower extremity causing rest pain  POST-OPERATIVE DIAGNOSIS:  Same  PROCEDURE:   Left femoral endarterectomy with bovine pericardial patch angioplasty (CPT 35371)  SURGEON:  Surgeons and Role:    * Magda Debby SAILOR, MD - Primary  ASSISTANT: Ahmed Holster, PA-C  An experienced assistant was required given the complexity of this procedure and the standard of surgical care. My assistant helped with exposure through counter tension, suctioning, ligation and retraction to better visualize the surgical field.  My assistant expedited sewing during the case by following my sutures. Wherever I use the term we in the report, my assistant actively helped me with that portion of the procedure.  ANESTHESIA:   general  EBL:  BLOOD ADMINISTERED:none  DRAINS: none   LOCAL MEDICATIONS USED:  NONE  SPECIMEN:  none  COUNTS: confirmed correct.  TOURNIQUET:  none  PATIENT DISPOSITION:  PACU - hemodynamically stable.   Delay start of Pharmacological VTE agent (>24hrs) due to surgical blood loss or risk of bleeding: yes  INDICATION FOR PROCEDURE: William Perkins is a 73 y.o. male with left leg ischemic rest pain. After careful discussion of risks, benefits, and alternatives the patient was offered left femoral endarterectomy. The patient understood and wished to proceed.  OPERATIVE FINDINGS: Severe near occlusive plaque in the common femoral artery and proximal SFA/profunda.  Good result from endarterectomy.  DESCRIPTION OF PROCEDURE: After identification of the patient in the pre-operative holding area, the patient was transferred to the operating room. The patient was positioned supine on the operating room table. Anesthesia was induced. The surgery left groin was prepped and draped in standard fashion. A surgical pause was performed confirming correct  patient, procedure, and operative location.  Longitudinal incision was made at the common femoral artery and its bifurcation.  The incision was carried down through the subcutaneous tissue until the femoral sheath was identified.  This was entered carefully with Bovie electrocautery.  The artery was skeletonized from the external iliac artery to several centimeters beyond the origin of the SFA and profunda.  Patient was systemically heparinized.  Activated clotting time measurements were used throughout the case to confirm adequate anticoagulation.  Clamps were applied to the external iliac artery, superficial femoral artery, and profunda femoris artery.  An anterior arteriotomy was made with an 11 blade and extended with large Potts scissors to the superficial femoral artery.  Endarterectomy was then performed with a Astronomer to develop a medial plane.  Good result was achieved with no residual flap or debris in any of the endpoints.  A bovine pericardial patch was prepared remaining factures instructions and brought in the field for use as a patch angioplasty.  This was sewn to the arteriotomy using continuous running suture of 5-0 Prolene.  Prior to completion the patch was flushed and de-aired.  The repair was completed.  Clamps released on the branch vessels.  Good hemostasis was achieved after protamine  and manual pressure.  Doppler machine was used to evaluate the repair and triphasic Doppler flow was heard in the superficial femoral artery distal to the repair as well as the profunda femoris artery distal to the repair.  Satisfied within the case here.  Hemostasis was again confirmed in the patch and the surgical bed.  The wound was closed in layers using 2-0 Vicryl, 3-0 Vicryl, 4-0 Monocryl.  Benzoin and Steri-Strips were applied.  Upon completion of the case instrument and sharps counts were confirmed correct. The patient was transferred to the PACU in good condition. I was present for  all portions of the procedure.  FOLLOW UP PLAN: Assuming a normal postoperative course, I will see the patient in 4 weeks with ABI and LLE arterial duplex.   Debby SAILOR. Magda, MD [facility] Phone Number: [phone] 12/11/2023 1:18 PM

## 2023-12-11 NOTE — Anesthesia Postprocedure Evaluation (Signed)
 Anesthesia Post Note  Patient: William Perkins  Procedure(s) Performed: ENDARTERECTOMY, FEMORAL (Left)     Patient location during evaluation: PACU Anesthesia Type: General Level of consciousness: awake Pain management: pain level controlled Vital Signs Assessment: post-procedure vital signs reviewed and stable Respiratory status: spontaneous breathing Cardiovascular status: blood pressure returned to baseline Postop Assessment: no apparent nausea or vomiting Anesthetic complications: no   No notable events documented.  Last Vitals:  Vitals:   12/11/23 1523 12/11/23 1600  BP: 129/81 (!) 140/81  Pulse: (!) 58   Resp: 15   Temp: 36.5 C   SpO2: 100%     Last Pain:  Vitals:   12/11/23 1616  TempSrc:   PainSc: 0-No pain                 Lauraine KATHEE [place]

## 2023-12-11 NOTE — Interval H&P Note (Signed)
 History and Physical Interval Note:  12/11/2023 10:37 AM  William Perkins  has presented today for surgery, with the diagnosis of atherosclerosis LLE with rest pain.  The various methods of treatment have been discussed with the patient and family. After consideration of risks, benefits and other options for treatment, the patient has consented to  Procedure(s): ENDARTERECTOMY, FEMORAL (Left) as a surgical intervention.  The patient's history has been reviewed, patient examined, no change in status, stable for surgery.  I have reviewed the patient's chart and labs.  Questions were answered to the patient's satisfaction.     Debby LOISE Robertson

## 2023-12-11 NOTE — Anesthesia Procedure Notes (Signed)
 Procedure Name: Intubation Date/Time: 12/11/2023 11:35 AM  Performed by: Zelphia Norleen HERO, CRNAPre-anesthesia Checklist: Patient identified, Emergency Drugs available, Suction available and Patient being monitored Patient Re-evaluated:Patient Re-evaluated prior to induction Oxygen Delivery Method: Circle system utilized Preoxygenation: Pre-oxygenation with 100% oxygen Induction Type: IV induction Ventilation: Mask ventilation without difficulty Laryngoscope Size: Mac and 4 Grade View: Grade II Tube type: Oral Tube size: 7.0 mm Number of attempts: 1 Airway Equipment and Method: Stylet and Oral airway Placement Confirmation: ETT inserted through vocal cords under direct vision, positive ETCO2 and breath sounds checked- equal and bilateral Secured at: 23 cm Tube secured with: Tape Dental Injury: Teeth and Oropharynx as per pre-operative assessment

## 2023-12-12 ENCOUNTER — Encounter (HOSPITAL_COMMUNITY): Payer: Self-pay | Admitting: Vascular Surgery

## 2023-12-12 DIAGNOSIS — Z48812 Encounter for surgical aftercare following surgery on the circulatory system: Secondary | ICD-10-CM

## 2023-12-12 LAB — BASIC METABOLIC PANEL WITH GFR
Anion gap: 16 — ABNORMAL HIGH (ref 5–15)
BUN: 18 mg/dL (ref 8–23)
CO2: 21 mmol/L — ABNORMAL LOW (ref 22–32)
Calcium: 8.7 mg/dL — ABNORMAL LOW (ref 8.9–10.3)
Chloride: 100 mmol/L (ref 98–111)
Creatinine, Ser: 2.39 mg/dL — ABNORMAL HIGH (ref 0.61–1.24)
GFR, Estimated: 28 mL/min — ABNORMAL LOW (ref >60)
Glucose, Bld: 84 mg/dL (ref 70–99)
Potassium: 3.8 mmol/L (ref 3.5–5.1)
Sodium: 137 mmol/L (ref 135–145)

## 2023-12-12 LAB — CBC
HCT: 30.4 % — ABNORMAL LOW (ref 39.0–52.0)
Hemoglobin: 9.9 g/dL — ABNORMAL LOW (ref 13.0–17.0)
MCH: 30 pg (ref 26.0–34.0)
MCHC: 32.6 g/dL (ref 30.0–36.0)
MCV: 92.1 fL (ref 80.0–100.0)
Platelets: 239 K/uL (ref 150–400)
RBC: 3.3 MIL/uL — ABNORMAL LOW (ref 4.22–5.81)
RDW: 13.3 % (ref 11.5–15.5)
WBC: 7.3 K/uL (ref 4.0–10.5)
nRBC: 0 % (ref 0.0–0.2)

## 2023-12-12 MED ORDER — OXYCODONE-ACETAMINOPHEN 5-325 MG PO TABS: 1.0000 | ORAL_TABLET | Freq: Four times a day (QID) | ORAL | 0 refills | Status: DC | PRN

## 2023-12-12 NOTE — Discharge Summary (Signed)
  Discharge Summary  Patient ID: William Perkins [id number] 73 y.o. 11-15-1950  Admit date: 12/11/2023  Discharge date and time: 12/12/2023 10:35 AM   Admitting Physician: Debby LOISE Robertson, MD   Discharge Physician: same  Admission Diagnoses: Atherosclerosis of native artery of left lower extremity with rest pain (HCC) [I70.222] Critical limb ischemia of left lower extremity (HCC) [I70.222]  Discharge Diagnoses: same  Admission Condition: fair  Discharged Condition: fair  Indication for Admission: post op care  Hospital Course: [name] is a 73 year old male who was brought in as an outpatient underwent left femoral endarterectomy with bovine patch angioplasty due to critical limb ischemia of the left lower extremity with rest pain.  This was performed by Dr. Robertson on 12/11/2023.  He tolerated procedure was admitted to the hospital postoperatively.  POD #1 subjectively the left foot feels better since surgery.  He has dopplerable flow in the DP and PT position.  His left groin incision is well-appearing without hematoma or drainage.  He ambulated with therapy teams and was feeling ready for discharge home.  He will follow-up in the office in a few weeks with a left lower extremity arterial duplex and ABI.  He was prescribed narcotic pain medication for continued postoperative pain control.  He was discharged home in stable condition.  Consults: None  Treatments: surgery: Left femoral endarterectomy with bovine patch angioplasty by Dr. Robertson on 12/11/2023  Discharge Exam: See progress note 12/12/23 Vitals:   12/12/23 0340 12/12/23 0807  BP: 139/89 106/79  Pulse: 74 84  Resp: 17 13  Temp: 98.6 F (37 C)   SpO2: 100% 96%     Disposition: Discharge disposition: 01-Home or Self Care       Patient Instructions:  Allergies as of 12/12/2023   No Known Allergies      Medication List     STOP taking these medications    oxyCODONE  5 MG immediate release  tablet Commonly known as: Roxicodone        TAKE these medications    acetaminophen  500 MG tablet Commonly known as: TYLENOL  Take 1,000 mg by mouth every 6 (six) hours as needed for mild pain or moderate pain.   amLODipine  10 MG tablet Commonly known as: NORVASC  Take 1 tablet (10 mg total) by mouth daily.   aspirin  EC 81 MG tablet Take 81 mg by mouth daily. Swallow whole.   atorvastatin  80 MG tablet Commonly known as: LIPITOR Take 1 tablet (80 mg total) by mouth daily.   cloNIDine  0.1 MG tablet Commonly known as: CATAPRES  Take 1 tablet (0.1 mg total) by mouth 2 (two) times daily.   lidocaine  2 % solution Commonly known as: XYLOCAINE  Patient: Mix 1part 2% viscous lidocaine , 1part H20. Swallow 10mL of diluted mixture, before meals and at bedtime, up to QID   lisinopril  40 MG tablet Commonly known as: ZESTRIL  Take 1 tablet (40 mg total) by mouth daily.   oxyCODONE -acetaminophen  5-325 MG tablet Commonly known as: PERCOCET/ROXICET Take 1 tablet by mouth every 6 (six) hours as needed for severe pain (pain score 7-10). What changed: when to take this   polyethylene glycol 17 g packet Commonly known as: MIRALAX  / GLYCOLAX  Take 17 g by mouth 2 (two) times daily.       Activity: activity as tolerated Diet: regular diet Wound Care: keep wound clean and dry  Follow-up with [facility] in 3 weeks.  SignedBETHA Donnice Sender, PA-C 12/12/2023 11:24 AM [facility] Office: [phone]

## 2023-12-12 NOTE — Progress Notes (Signed)
 PHARMACIST LIPID MONITORING   William Perkins is a 73 y.o. male admitted on 12/11/2023 with  atherosclerosis of native arteries of left lower extremity causing rest pain s/p L femoral endarterectomy for rest pain on 12/12/23. SABRA  Pharmacy has been consulted to optimize lipid-lowering therapy with the indication of secondary prevention for clinical ASCVD.  Recent Labs:  Lipid Panel (last 6 months):   Lab Results  Component Value Date   CHOL 157 09/16/2023   TRIG 110 09/16/2023   HDL 53 09/16/2023   CHOLHDL 3.0 09/16/2023   LDLCALC 84 09/16/2023    Hepatic function panel (last 6 months):   Lab Results  Component Value Date   AST 26 12/11/2023   ALT 10 12/11/2023   ALKPHOS 73 12/11/2023   BILITOT 0.7 12/11/2023    SCr (since admission):   Serum creatinine: 2.39 mg/dL (H) 89/90/74 9356 Estimated creatinine clearance: 23.5 mL/min (A)  Current therapy and lipid therapy tolerance Current lipid-lowering therapy: atorvastatin  80mg  Previous lipid-lowering therapies (if applicable): pravastatin , rosuvastatin   Documented or reported allergies or intolerances to lipid-lowering therapies (if applicable): none  Assessment:   09/2023 LDL 84 is above goal but patient was switched from rosuvastatin  20mg  to atorvastatin  80mg  since then. Not having any issues. Lipid panel not drawn 12/12/23 and patient prefers not to get poked again with follow up outpatient.  Plan:    1.Statin intensity (high intensity recommended for all patients regardless of the LDL):  No statin changes. The patient is already on a high intensity statin.  2.Add ezetimibe (if any one of the following): Hold for now  3.Refer to lipid clinic:   No  4.Follow-up with:  Primary care provider - Rosan Dayton JAYSON, DO  5.Follow-up labs after discharge: No changes in lipid therapy made but needs repeat lipid panel as outpatient   Jinnie Door, PharmD, BCPS, [facility] Clinical Pharmacist  Please check AMION for all [facility] Pharmacy phone  numbers After 10:00 PM, call Main Pharmacy [phone]

## 2023-12-12 NOTE — Discharge Instructions (Addendum)
 Please make sure you get a repeat Cholesterol panel with your primary care provider.      [facility]ascular and Vein Specialists of [facility]  Discharge instructions  Lower Extremity Bypass Surgery  Please refer to the following instruction for your post-procedure care. Your surgeon or physician assistant will discuss any changes with you.  Activity  You are encouraged to walk as much as you can. You can slowly return to normal activities during the month after your surgery. Avoid strenuous activity and heavy lifting until your doctor tells you it's OK. Avoid activities such as vacuuming or swinging a golf club. Do not drive until your doctor give the OK and you are no longer taking prescription pain medications. It is also normal to have difficulty with sleep habits, eating and bowel movement after surgery. These will go away with time.  Bathing/Showering  You may shower after you go home. Do not soak in a bathtub, hot tub, or swim until the incision heals completely.  Incision Care  Clean your incision with mild soap and water. Shower every day. Pat the area dry with a clean towel. You do not need a bandage unless otherwise instructed. Do not apply any ointments or creams to your incision. If you have open wounds you will be instructed how to care for them or a visiting nurse may be arranged for you. If you have staples or sutures along your incision they will be removed at your post-op appointment. You may have skin glue on your incision. Do not peel it off. It will come off on its own in about one week. If you have a great deal of moisture in your groin, use a gauze help keep this area dry.  Diet  Resume your normal diet. There are no special food restrictions following this procedure. A low fat/ low cholesterol diet is recommended for all patients with vascular disease. In order to heal from your surgery, it is CRITICAL to get adequate nutrition. Your body requires vitamins, minerals, and  protein. Vegetables are the best source of vitamins and minerals. Vegetables also provide the perfect balance of protein. Processed food has little nutritional value, so try to avoid this.  Medications  Resume taking all your medications unless your doctor or nurse practitioner tells you not to. If your incision is causing pain, you may take over-the-counter pain relievers such as acetaminophen  (Tylenol ). If you were prescribed a stronger pain medication, please aware these medication can cause nausea and constipation. Prevent nausea by taking the medication with a snack or meal. Avoid constipation by drinking plenty of fluids and eating foods with high amount of fiber, such as fruits, vegetables, and grains. Take Colase 100 mg (an over-the-counter stool softener) twice a day as needed for constipation. Do not take Tylenol  if you are taking prescription pain medications.  Follow Up  Our office will schedule a follow up appointment 2-3 weeks following discharge.  Please call us  immediately for any of the following conditions  Severe or worsening pain in your legs or feet while at rest or while walking Increase pain, redness, warmth, or drainage (pus) from your incision site(s) Fever of 101 degree or higher The swelling in your leg with the bypass suddenly worsens and becomes more painful than when you were in the hospital If you have been instructed to feel your graft pulse then you should do so every day. If you can no longer feel this pulse, call the office immediately. Not all patients are given this instruction.  Leg swelling is common after leg bypass surgery.  The swelling should improve over a few months following surgery. To improve the swelling, you may elevate your legs above the level of your heart while you are sitting or resting. Your surgeon or physician assistant may ask you to apply an ACE wrap or wear compression (TED) stockings to help to reduce swelling.  Reduce your risk of  vascular disease  Stop smoking. If you would like help call [facility] at 1-800-QUIT-NOW (228-579-6745) or [place] at 613-145-8735.  Manage your cholesterol Maintain a desired weight Control your diabetes weight Control your diabetes Keep your blood pressure down  If you have any questions, please call the office at [phone]

## 2023-12-12 NOTE — Progress Notes (Signed)
 [facility]ASC[facility] VEIN SPECIALISTS OF [place] PROGRESS NOTE  ASSESSMENT / PLAN: William Perkins is a 73 y.o. male s/p L femoral endarterectomy for rest pain on 12/12/23. Overall looks great POD#1. Foot feels better subjectively. Mobilize as able. If comfortable ambulating, OK to discharge with follow up with me in 4 weeks with ABI and LLE duplex.   SUBJECTIVE: Foot feels better. Some nausea overnight. Some pain in the incision site.  OBJECTIVE: BP 139/89 (BP Location: Right Arm)   Pulse 74   Temp 98.6 F (37 C) (Bladder)   Resp 17   Ht 5' 11 (1.803 m)   Wt 60.3 kg   SpO2 100%   BMI 18.55 kg/m   Intake/Output Summary (Last 24 hours) at 12/12/2023 0735 Last data filed at 12/11/2023 1730 Gross per 24 hour  Intake 1250 ml  Output 200 ml  Net 1050 ml    No distress Regular rate and rhythm Unlabored breathing Left groin soft. Dressing clean and dry. +DS in L peroneal > DP     Latest Ref Rng & Units 12/12/2023    6:43 AM 12/11/2023   10:10 AM 12/06/2023   10:40 AM  CBC  WBC 4.0 - 10.5 K/uL 7.3  5.5    Hemoglobin 13.0 - 17.0 g/dL 9.9  89.1  88.3   Hematocrit 39.0 - 52.0 % 30.4  34.3  34.0   Platelets 150 - 400 K/uL 239  252          Latest Ref Rng & Units 12/12/2023    6:43 AM 12/11/2023   10:10 AM 12/06/2023   10:40 AM  CMP  Glucose 70 - 99 mg/dL 84  870  875   BUN 8 - 23 mg/dL 18  21  27    Creatinine 0.61 - 1.24 mg/dL 7.60  7.64  7.59   Sodium 135 - 145 mmol/L 137  135  139   Potassium 3.5 - 5.1 mmol/L 3.8  4.1  4.2   Chloride 98 - 111 mmol/L 100  101  104   CO2 22 - 32 mmol/L 21  23    Calcium  8.9 - 10.3 mg/dL 8.7  9.1    Total Protein 6.5 - 8.1 g/dL  6.5    Total Bilirubin 0.0 - 1.2 mg/dL  0.7    Alkaline Phos 38 - 126 U/L  73    AST 15 - 41 U/L  26    ALT 0 - 44 U/L  10      Estimated Creatinine Clearance: 23.5 mL/min (A) (by C-G formula based on SCr of 2.39 mg/dL (H)).  Debby SAILOR. Magda, MD [facility] of [facility] Phone Number:  [phone] 12/12/2023 7:35 AM

## 2023-12-12 NOTE — Progress Notes (Signed)
 Discharge  Discharge instruction reviewed with patient and wife.  Both expressed verbal understanding of POC.  NO PIV NO TELE on during discharge.  Additional education included in AVS.   Volunteer called for discharge to Main A.

## 2023-12-12 NOTE — Evaluation (Signed)
 Occupational Therapy Evaluation Patient Details Name: William Perkins MRN: [id number] DOB: 08-28-1950 Today's Date: 12/12/2023   History of Present Illness   Pt is a 73 y.o male admitted 10/8 for scheduled L fem endarterectomy. PMH: DDD, HLD, HTN     Clinical Impressions Pt admitted based on above, and was seen based on problem list below. PTA pt was independent with ADLs and IADLs. Today pt is requiring set up  to CGA for ADLs. Functional transfers are  CGA with use of RW. Anticipate pt will progress well once pain improves, no follow up OT or DME needs. OT will continue to follow acutely to maximize functional independence.     If plan is discharge home, recommend the following:   A little help with walking and/or transfers;Assistance with cooking/housework     Functional Status Assessment   Patient has had a recent decline in their functional status and demonstrates the ability to make significant improvements in function in a reasonable and predictable amount of time.     Equipment Recommendations   None recommended by OT      Precautions/Restrictions   Precautions Precautions: None Recall of Precautions/Restrictions: Intact Restrictions Weight Bearing Restrictions Per Provider Order: No     Mobility Bed Mobility Overal bed mobility: Modified Independent     General bed mobility comments: HOB elevated, increased time    Transfers Overall transfer level: Needs assistance Equipment used: Rolling walker (2 wheels) Transfers: Sit to/from Stand Sit to Stand: Contact guard assist           General transfer comment: CGA for balance, tolerating in room mobility well      Balance Overall balance assessment: Needs assistance Sitting-balance support: No upper extremity supported, Feet supported Sitting balance-Leahy Scale: Fair     Standing balance support: Bilateral upper extremity supported, During functional activity, Reliant on assistive device for  balance Standing balance-Leahy Scale: Poor Standing balance comment: Reliant on RW       ADL either performed or assessed with clinical judgement   ADL Overall ADL's : Needs assistance/impaired Eating/Feeding: Set up;Sitting   Grooming: Wash/dry face;Wash/dry hands;Contact guard assist;Standing       Upper Body Dressing : Set up;Sitting   Lower Body Dressing: Contact guard assist;Sit to/from stand Lower Body Dressing Details (indicate cue type and reason): CGA for balance, able to don socks seated EOB Toilet Transfer: Contact guard assist;Ambulation;Rolling walker (2 wheels)   Toileting- Clothing Manipulation and Hygiene: Contact guard assist;Sit to/from stand       Functional mobility during ADLs: Contact guard assist;Rolling walker (2 wheels) General ADL Comments: CGA for balance, increased time     Vision Baseline Vision/History: 1 Wears glasses Patient Visual Report: No change from baseline Vision Assessment?: No apparent visual deficits            Pertinent Vitals/Pain Pain Assessment Pain Assessment: 0-10 Pain Score: 5  Pain Location: LLE Pain Descriptors / Indicators: Discomfort, Grimacing Pain Intervention(s): Limited activity within patient's tolerance     Extremity/Trunk Assessment Upper Extremity Assessment Upper Extremity Assessment: Overall WFL for tasks assessed   Lower Extremity Assessment Lower Extremity Assessment: Defer to PT evaluation   Cervical / Trunk Assessment Cervical / Trunk Assessment: Normal   Communication Communication Communication: No apparent difficulties   Cognition Arousal: Alert Behavior During Therapy: WFL for tasks assessed/performed Cognition: No apparent impairments   Following commands: Intact       Cueing  General Comments   Cueing Techniques: Verbal cues  VSS on RA  Home Living Family/patient expects to be discharged to:: Private residence Living Arrangements: Spouse/significant  other Available Help at Discharge: Family Type of Home: House Home Access: Level entry     Home Layout: One level     Bathroom Shower/Tub: Tub/shower unit;Walk-in shower   Bathroom Toilet: Standard     Home Equipment: Agricultural consultant (2 wheels);BSC/3in1          Prior Functioning/Environment Prior Level of Function : Independent/Modified Independent     Mobility Comments: Use of RW, denies falls ADLs Comments: Ind    OT Problem List: Decreased strength;Decreased range of motion;Decreased activity tolerance;Impaired balance (sitting and/or standing);Decreased safety awareness;Cardiopulmonary status limiting activity   OT Treatment/Interventions: Self-care/ADL training;Therapeutic exercise;Energy conservation;Therapeutic activities;Patient/family education;Balance training      OT Goals(Current goals can be found in the care plan section)   Acute Rehab OT Goals Patient Stated Goal: To go home OT Goal Formulation: With patient Time For Goal Achievement: 12/26/23 Potential to Achieve Goals: Good   OT Frequency:  Min 2X/week       AM-PAC OT 6 Clicks Daily Activity     Outcome Measure Help from another person eating meals?: None Help from another person taking care of personal grooming?: A Little Help from another person toileting, which includes using toliet, bedpan, or urinal?: A Little Help from another person bathing (including washing, rinsing, drying)?: A Little Help from another person to put on and taking off regular upper body clothing?: A Little Help from another person to put on and taking off regular lower body clothing?: A Little 6 Click Score: 19   End of Session Equipment Utilized During Treatment: Gait belt;Rolling walker (2 wheels) Nurse Communication: Mobility status  Activity Tolerance: Patient tolerated treatment well Patient left: in chair;with call bell/phone within reach  OT Visit Diagnosis: Unsteadiness on feet (R26.81);Other  abnormalities of gait and mobility (R26.89);Muscle weakness (generalized) (M62.81)                Time: 9269-9247 OT Time Calculation (min): 22 min Charges:  OT General Charges $OT Visit: 1 Visit OT Evaluation $OT Eval Moderate Complexity: 1 Mod  Adrianne BROCKS, OT  Acute Rehabilitation Services Office [phone] Secure chat preferred   Adrianne GORMAN Savers 12/12/2023, 8:34 AM

## 2023-12-13 ENCOUNTER — Telehealth: Payer: Self-pay

## 2023-12-13 NOTE — Transitions of Care (Post Inpatient/ED Visit) (Signed)
 12/13/2023  Name: William Perkins MRN: [id number] DOB: Jan 14, 1951  Today's TOC FU Call Status: Today's TOC FU Call Status:: Successful TOC FU Call Completed TOC FU Call Complete Date: 12/13/23 Patient's Name and Date of Birth confirmed.  Transition Care Management Follow-up Telephone Call Date of Discharge: 12/12/23 Discharge Facility: [facility]) Type of Discharge: Inpatient Admission Primary Inpatient Discharge Diagnosis:: Critical limb ischemia of left lower extremity How have you been since you were released from the hospital?: Better Any questions or concerns?: No  Items Reviewed: Did you receive and understand the discharge instructions provided?: Yes Medications obtained,verified, and reconciled?: Yes (Medications Reviewed) Any new allergies since your discharge?: No Dietary orders reviewed?: NA Do you have support at home?: Yes People in Home [RPT]: spouse Name of Support/Comfort Primary Source: Annette  Medications Reviewed Today: Medications Reviewed Today     Reviewed by Rumalda Alan PENNER, RN (Registered Nurse) on 12/13/23 at 1032  Med List Status: <None>   Medication Order Taking? Sig Documenting Provider Last Dose Status Informant  acetaminophen  (TYLENOL ) 500 MG tablet [id number] Yes Take 1,000 mg by mouth every 6 (six) hours as needed for mild pain or moderate pain. [provider]  Active Self  amLODipine  (NORVASC ) 10 MG tablet [id number] Yes Take 1 tablet (10 mg total) by mouth daily. Jolaine Pac, DO  Active Self  aspirin  EC 81 MG tablet [id number] Yes Take 81 mg by mouth daily. Swallow whole. [provider]  Active Self  atorvastatin  (LIPITOR) 80 MG tablet [id number] Yes Take 1 tablet (80 mg total) by mouth daily. Rosan Dayton JAYSON, DO  Active Self  cloNIDine  (CATAPRES ) 0.1 MG tablet [id number] Yes Take 1 tablet (0.1 mg total) by mouth 2 (two) times daily. Fernand Prost, MD  Active Self  lidocaine  (XYLOCAINE ) 2 % solution [id number]  Patient: Mix  1part 2% viscous lidocaine , 1part H20. Swallow 10mL of diluted mixture, before meals and at bedtime, up to QID  Patient not taking: Reported on 12/13/2023   Izell Domino, MD  Active Self  lisinopril  (ZESTRIL ) 40 MG tablet [id number] Yes Take 1 tablet (40 mg total) by mouth daily. Jolaine Pac, DO  Active Self  oxyCODONE -acetaminophen  (PERCOCET/ROXICET) 5-325 MG tablet [id number] Yes Take 1 tablet by mouth every 6 (six) hours as needed for severe pain (pain score 7-10). Eveland, Matthew, PA-C  Active   polyethylene glycol (MIRALAX  / GLYCOLAX ) 17 g packet [id number] Yes Take 17 g by mouth 2 (two) times daily. Barbaraann Katz, MD  Active Self            Home Care and Equipment/Supplies: Were Home Health Services Ordered?: No Any new equipment or medical supplies ordered?: No  Functional Questionnaire: Do you need assistance with bathing/showering or dressing?: No Do you need assistance with meal preparation?: No Do you need assistance with eating?: No Do you have difficulty maintaining continence: No Do you need assistance with getting out of bed/getting out of a chair/moving?: No Do you have difficulty managing or taking your medications?: No  Follow up appointments reviewed: PCP Follow-up appointment confirmed?: No (Will call an make appointment) Specialist Hospital Follow-up appointment confirmed?: Yes Date of Specialist follow-up appointment?: 01/14/24 Follow-Up Specialty Provider:: Vein and Vascular Surgeon Do you need transportation to your follow-up appointment?: No Do you understand care options if your condition(s) worsen?: Yes-patient verbalized understanding  SDOH Interventions Today    Flowsheet Row Most Recent Value  SDOH Interventions   Food Insecurity Interventions Intervention Not Indicated  Housing Interventions Intervention Not  Indicated  Transportation Interventions Intervention Not Indicated  Utilities Interventions Intervention Not Indicated   Patient  reports that he is doing well. Reports his only concern is constipation. Reports that he is taking miralax . Reviewed with patient that pain medications cause constipation. Encouraged patient to take Miralax  as prescribed, Increase his water and walk around more. Encouraged patient to make a hospital follow up appointment with MD. Reviewed all medications and patient is taking his medications as prescribed. Assessed pain. Reviewed dressing instructions Reviewed activity instructions. Reviewed pending follow up vein and vascular. Reviewed reasons to call MD.  Reviewed and offered 30 day TOC program and patient declined. Provided my contact information if patient needs assistance.   Alan Ee, RN, BSN, CEN [employer]- Transition of Care Team.  [facility] [phone]

## 2023-12-14 ENCOUNTER — Other Ambulatory Visit: Payer: Self-pay | Admitting: Student

## 2023-12-14 DIAGNOSIS — I10 Essential (primary) hypertension: Secondary | ICD-10-CM

## 2023-12-16 ENCOUNTER — Other Ambulatory Visit: Payer: Self-pay

## 2023-12-16 DIAGNOSIS — I10 Essential (primary) hypertension: Secondary | ICD-10-CM

## 2023-12-16 MED ORDER — LISINOPRIL 40 MG PO TABS: 40.0000 mg | ORAL_TABLET | Freq: Every day | ORAL | 3 refills | Status: DC

## 2023-12-16 NOTE — Telephone Encounter (Signed)
 Medication sent to pharmacy

## 2023-12-17 ENCOUNTER — Telehealth: Payer: Self-pay

## 2023-12-17 NOTE — Telephone Encounter (Signed)
 Pt called c/o left foot and leg pain s/p left femoral endarterectomy on 12/11/23. Pt reported left foot ulcers that were present before the surgery. Pt reported mild swelling.  Advised patient to elevate leg while not ambulating. Advised patient to ambulate as much as tolerated.

## 2023-12-19 ENCOUNTER — Other Ambulatory Visit: Payer: Self-pay

## 2023-12-19 DIAGNOSIS — I739 Peripheral vascular disease, unspecified: Secondary | ICD-10-CM

## 2023-12-19 DIAGNOSIS — I70222 Atherosclerosis of native arteries of extremities with rest pain, left leg: Secondary | ICD-10-CM

## 2023-12-26 ENCOUNTER — Telehealth: Payer: Self-pay

## 2023-12-26 NOTE — Telephone Encounter (Signed)
 Copied from CRM [id number]. Topic: Clinical - Medication Question >> Dec 26, 2023 11:05 AM Merlynn LABOR wrote: Reason for CRM: Patient is requesting call back from nurse regarding medication picked up from pharmacy. Please contact wife at [phone].

## 2023-12-26 NOTE — Telephone Encounter (Signed)
 Return call to pt - talked to his wife along w/pt. Stated they picked up Rosuvastatin  10 mg from [facility]  I told them per chart, pt is on Atorvastatin  and wife agreed. Informed pt's wife to call [facility] to let them know - stated she will.

## 2024-01-06 ENCOUNTER — Telehealth: Payer: Self-pay

## 2024-01-06 NOTE — Telephone Encounter (Signed)
 Pt called asking form more oxycodone  for pain to left foot.   Pt called c/o left foot and leg pain s/p left femoral endarterectomy on 12/11/23. Pt reported left foot ulcers that were present before the surgery. Pt reported mild swelling.   Advised patient to elevate leg while not ambulating. Advised patient to ambulate as much as tolerated.

## 2024-01-09 ENCOUNTER — Telehealth: Payer: Self-pay

## 2024-01-09 MED ORDER — SODIUM BICARBONATE 650 MG PO TABS: 1300.0000 mg | ORAL_TABLET | Freq: Two times a day (BID) | ORAL | 5 refills | Status: DC

## 2024-01-09 NOTE — Telephone Encounter (Signed)
 Pharmacy is requesting a new rx for sodium bicarbonate  medication has expired off of med list.  [id number] - [place], [place] - [address] AT [address]

## 2024-01-09 NOTE — Telephone Encounter (Signed)
 Sent new rx

## 2024-01-14 ENCOUNTER — Ambulatory Visit (HOSPITAL_COMMUNITY): Admit: 2024-01-14 | Discharge: 2024-01-14 | Disposition: A | Attending: Vascular Surgery

## 2024-01-14 ENCOUNTER — Ambulatory Visit: Admitting: Vascular Surgery

## 2024-01-14 ENCOUNTER — Ambulatory Visit (HOSPITAL_COMMUNITY)
Admission: RE | Admit: 2024-01-14 | Discharge: 2024-01-14 | Disposition: A | Discharge status: Home / Self Care | Source: Ambulatory Visit | Attending: Vascular Surgery | Admitting: Vascular Surgery

## 2024-01-14 ENCOUNTER — Encounter: Payer: Self-pay | Admitting: Vascular Surgery

## 2024-01-14 VITALS — BP 159/81 | HR 75 | Temp 98.2°F | Ht 71.0 in | Wt 128.0 lb

## 2024-01-14 DIAGNOSIS — I70222 Atherosclerosis of native arteries of extremities with rest pain, left leg: Secondary | ICD-10-CM

## 2024-01-14 DIAGNOSIS — I739 Peripheral vascular disease, unspecified: Secondary | ICD-10-CM | POA: Insufficient documentation

## 2024-01-14 MED ORDER — HYDROCODONE-ACETAMINOPHEN 5-325 MG PO TABS: 1.0000 | ORAL_TABLET | Freq: Four times a day (QID) | ORAL | 0 refills | Status: DC | PRN

## 2024-01-14 NOTE — Progress Notes (Signed)
 [facility] [place]  ASSESSMENT / PLAN: 73 y.o. male with atherosclerosis of native arteries of left leg causing ischemic ulceration. Status post left femoral endarterectomy 12/11/23. His left leg has unfortunately deteriorated postoperatively despite patency of his endarterectomy by duplex.   Recommend:  Abstinence from all tobacco products. Blood glucose control with goal A1c < 7%. Blood pressure control with goal blood pressure < 130/80 mmHg. Lipid reduction therapy with goal LDL-C < 55 mg/dL. Aspirin  81mg  by mouth daily. Atorvastatin  40-80mg  PO QD (or other high intensity statin therapy).   Plan left lower extremity angiogram with possible intervention via right common femoral approach in cath lab. Will use CO2 angiography.  I am hopeful we can find a distal anastomotic target for bypass surgery  CHIEF COMPLAINT: post op  HISTORY OF PRESENT ILLNESS: William Perkins is a 73 y.o. male who returns to clinic for evaluation of ischemic rest pain of the left lower extremity. Patient is well-known to me, having undergone a right iliofemoral endarterectomy in 2022 for ischemic rest pain. He is initially referred for evaluation of right subclavian artery stenosis identified on carotid artery duplex. From the right upper extremity standpoint, the patient is asymptomatic. He reports no claudication, dizziness, rest pain, ulceration of the right upper extremity. The left lower extremity is incredibly painful to him. The pain is very typical of ischemic rest pain. The pain is about the metatarsal bones and is constant. The pain awakens him from sleep. I reviewed the plan for angiogram.   01/14/24: Patient returns to clinic for postoperative evaluation after left femoral endarterectomy.  Unfortunately his left foot has deteriorated significantly.  He reported initial symptom relief in the hospital, but has progressed and has new ischemic ulceration of the toes of the left foot.  I  counseled him about the worrisome nature of this finding, based on her previous angiogram results.  I counseled him that redo angiogram is probably the best next step to try to avoid limb loss.  I counseled him that he is at extreme risk for limb loss and he is understandably upset about this.  Past Medical History:  Diagnosis Date   Cancer (HCC) 02/2023   laryngeal cancer   Chronic renal insufficiency 03/21/2006   CrCl <50 ml   DDD (degenerative disc disease), cervical    GERD 03/22/2006   Qualifier: Diagnosis of  By: [name]     GERD (gastroesophageal reflux disease) 03/22/2006   Hand numbness 10/07/2009   pt states not any longer   Hyperlipidemia 03/22/2008   Hypertension    Hypertension 03/22/2006   Left ventricular hypertrophy 03/22/2006   Leg pain, left 11/15/2009   Lumbar strain 07/15/2008   Renal insufficiency    Soft tissue disorder 08/2008   Tobacco abuse 03/22/2006   2 ppd x 30 years    Past Surgical History:  Procedure Laterality Date   ABDOMINAL AORTOGRAM N/A 12/06/2023   Procedure: ABDOMINAL AORTOGRAM;  Surgeon: Magda Debby SAILOR, MD;  Location: [facility];  Service: Cardiovascular;  Laterality: N/A;   ABDOMINAL AORTOGRAM W/LOWER EXTREMITY N/A 05/18/2020   Procedure: ABDOMINAL AORTOGRAM W/LOWER EXTREMITY;  Surgeon: Magda Debby SAILOR, MD;  Location: [facility];  Service: Cardiovascular;  Laterality: N/A;   BACK SURGERY  2020   PLIF   COLONOSCOPY     DIRECT LARYNGOSCOPY N/A 01/24/2023   Procedure: DIRECT LARYNGOSCOPY;  Surgeon: Tobie Eldora NOVAK, MD;  Location: [facility] OR;  Service: ENT;  Laterality: N/A;  DIRECT  LARYNGOSCOPY WITH BIOPSY OF VOCAL CORD LESION   ENDARTERECTOMY FEMORAL Right 05/20/2020   Procedure: RIGHT EXTENDED FEMORAL ENDARTERECTOMY AND PROFUNDAPLASTY;  Surgeon: Magda Debby SAILOR, MD;  Location: [facility] OR;  Service: Vascular;  Laterality: Right;   ENDARTERECTOMY FEMORAL Left 12/11/2023   Procedure: ENDARTERECTOMY, FEMORAL;  Surgeon:  Magda Debby SAILOR, MD;  Location: [facility] OR;  Service: Vascular;  Laterality: Left;   LOWER EXTREMITY ANGIOGRAPHY N/A 12/06/2023   Procedure: Lower Extremity Angiography;  Surgeon: Magda Debby SAILOR, MD;  Location: [facility];  Service: Cardiovascular;  Laterality: N/A;   WISDOM TOOTH EXTRACTION      Family History  Problem Relation Age of Onset   Diabetes Mother    Diabetes Sister    Diabetes Brother    Heart disease Brother    Rectal cancer Neg Hx    Stomach cancer Neg Hx    Colon cancer Neg Hx     Social History   Socioeconomic History   Marital status: Married    Spouse name: Anette   Number of children: 0   Years of education: 12   Highest education level: Not on file  Occupational History   Occupation: Retired  Tobacco Use   Smoking status: Former    Current packs/day: 0.00    Average packs/day: 1 pack/day for 44.0 years (44.0 ttl pk-yrs)    Types: Cigarettes    Start date: 11/21/1969    Quit date: 11/21/2013    Years since quitting: 10.1   Smokeless tobacco: Never   Tobacco comments:    Stopped 11/21/2013   Vaping Use   Vaping status: Never Used  Substance and Sexual Activity   Alcohol use: No    Alcohol/week: 0.0 standard drinks of alcohol    Comment: Quit 2015   Drug use: No   Sexual activity: Yes    Partners: Female  Other Topics Concern   Not on file  Social History Narrative   Not on file   Social Drivers of Health   Financial Resource Strain: Low Risk  (11/27/2023)   Overall Financial Resource Strain (CARDIA)    Difficulty of Paying Living Expenses: Not hard at all  Food Insecurity: No Food Insecurity (12/13/2023)   Hunger Vital Sign    Worried About Running Out of Food in the Last Year: Never true    Ran Out of Food in the Last Year: Never true  Transportation Needs: No Transportation Needs (12/13/2023)   PRAPARE - Administrator, Civil Service (Medical): No    Lack of Transportation (Non-Medical): No  Physical Activity:  Insufficiently Active (11/27/2023)   Exercise Vital Sign    Days of Exercise per Week: 7 days    Minutes of Exercise per Session: 20 min  Stress: No Stress Concern Present (11/27/2023)   [employer] of Occupational Health - Occupational Stress Questionnaire    Feeling of Stress: Not at all  Social Connections: Moderately Isolated (11/27/2023)   Social Connection and Isolation Panel    Frequency of Communication with Friends and Family: Once a week    Frequency of Social Gatherings with Friends and Family: Three times a week    Attends Religious Services: Never    Active Member of Clubs or Organizations: No    Attends Banker Meetings: Never    Marital Status: Married  Catering Manager Violence: Not At Risk (12/13/2023)   Humiliation, Afraid, Rape, and Kick questionnaire    Fear of Current or Ex-Partner: No    Emotionally  Abused: No    Physically Abused: No    Sexually Abused: No    No Known Allergies  Current Outpatient Medications  Medication Sig Dispense Refill   acetaminophen  (TYLENOL ) 500 MG tablet Take 1,000 mg by mouth every 6 (six) hours as needed for mild pain or moderate pain.     amLODipine  (NORVASC ) 10 MG tablet TAKE 1 TABLET(10 MG) BY MOUTH DAILY 90 tablet 3   aspirin  EC 81 MG tablet Take 81 mg by mouth daily. Swallow whole.     atorvastatin  (LIPITOR) 80 MG tablet Take 1 tablet (80 mg total) by mouth daily. 90 tablet 3   cloNIDine  (CATAPRES ) 0.1 MG tablet Take 1 tablet (0.1 mg total) by mouth 2 (two) times daily. 180 tablet 3   HYDROcodone -acetaminophen  (NORCO/VICODIN) 5-325 MG tablet Take 1 tablet by mouth every 6 (six) hours as needed for moderate pain (pain score 4-6). (Patient not taking: Reported on 01/14/2024) 30 tablet 0   lidocaine  (XYLOCAINE ) 2 % solution Patient: Mix 1part 2% viscous lidocaine , 1part H20. Swallow 10mL of diluted mixture, before meals and at bedtime, up to QID (Patient not taking: Reported on 01/14/2024) 200 mL 3    lisinopril  (ZESTRIL ) 40 MG tablet Take 1 tablet (40 mg total) by mouth daily. 90 tablet 3   oxyCODONE -acetaminophen  (PERCOCET/ROXICET) 5-325 MG tablet Take 1 tablet by mouth every 6 (six) hours as needed for severe pain (pain score 7-10). (Patient not taking: Reported on 01/14/2024) 20 tablet 0   polyethylene glycol (MIRALAX  / GLYCOLAX ) 17 g packet Take 17 g by mouth 2 (two) times daily. (Patient taking differently: Take 17 g by mouth daily as needed for mild constipation or moderate constipation.) 14 each 25   sodium bicarbonate  650 MG tablet Take 2 tablets (1,300 mg total) by mouth 2 (two) times daily. 120 tablet 5   hydrALAZINE  (APRESOLINE ) 50 MG tablet Take 50 mg by mouth daily at 12 noon. (Patient not taking: Reported on 01/14/2024)     rosuvastatin  (CRESTOR ) 20 MG tablet Take 20 mg by mouth at bedtime. (Patient not taking: Reported on 01/14/2024)     No current facility-administered medications for this visit.    PHYSICAL EXAM Vitals:   01/14/24 1349  BP: (!) 159/81  Pulse: 75  Temp: 98.2 F (36.8 C)  SpO2: 96%  Weight: 128 lb (58.1 kg)  Height: 5' 11 (1.803 m)   Elderly man in no distress Regular rate and rhythm Unlabored breathing Left leg ischemic about the foot.  There is mild erythema around the forefoot.  He has ischemic ulceration of the toes of the left foot.  PERTINENT LABORATORY AND RADIOLOGIC DATA  Most recent CBC    Latest Ref Rng & Units 12/12/2023    6:43 AM 12/11/2023   10:10 AM 12/06/2023   10:40 AM  CBC  WBC 4.0 - 10.5 K/uL 7.3  5.5    Hemoglobin 13.0 - 17.0 g/dL 9.9  89.1  88.3   Hematocrit 39.0 - 52.0 % 30.4  34.3  34.0   Platelets 150 - 400 K/uL 239  252       Most recent CMP    Latest Ref Rng & Units 12/12/2023    6:43 AM 12/11/2023   10:10 AM 12/06/2023   10:40 AM  CMP  Glucose 70 - 99 mg/dL 84  870  875   BUN 8 - 23 mg/dL 18  21  27    Creatinine 0.61 - 1.24 mg/dL 7.60  7.64  7.59   Sodium  135 - 145 mmol/L 137  135  139   Potassium 3.5 - 5.1  mmol/L 3.8  4.1  4.2   Chloride 98 - 111 mmol/L 100  101  104   CO2 22 - 32 mmol/L 21  23    Calcium  8.9 - 10.3 mg/dL 8.7  9.1    Total Protein 6.5 - 8.1 g/dL  6.5    Total Bilirubin 0.0 - 1.2 mg/dL  0.7    Alkaline Phos 38 - 126 U/L  73    AST 15 - 41 U/L  26    ALT 0 - 44 U/L  10      Renal function CrCl cannot be calculated (Patient's most recent lab result is older than the maximum 21 days allowed.).  Hemoglobin A1C (%)  Date Value  09/16/2023 6.9 (A)   Hgb A1c MFr Bld (%)  Date Value  08/17/2021 6.3 (H)    LDL Chol Calc (NIH)  Date Value Ref Range Status  09/16/2023 84 0 - 99 mg/dL Final     +-------+-----------+-----------+------------+------------+  ABI/TBIToday's ABIToday's TBIPrevious ABIPrevious TBI  +-------+-----------+-----------+------------+------------+  Right Burket         0.21       Burden          0.25          +-------+-----------+-----------+------------+------------+  Left  [place]         absent     0.26        0             +-------+-----------+-----------+------------+------------+   LOWER EXTREMITY ARTERIAL DUPLEX STUDY   Patient Name:  MARSHALL KAMPF  Date of Exam:   01/14/2024  Medical Rec #: [id number]       Accession #:    [id number]  Date of Birth: Jul 20, 1950       Patient Gender: M  Patient Age:   72 years  Exam Location:  [address]  Procedure:      VAS US  LOWER EXTREMITY ARTERIAL DUPLEX  Referring Phys: DEBBY ROBERTSON    ---------------------------------------------------------------------------  -----    Indications: Rest pain, and atherosclerosis of native arteries.   High Risk Factors: Hypertension, hyperlipidemia, Diabetes, past history of                     smoking.     Current ABI: right: [place]               left: [place]   Comparison Study: N/A   Performing Technologist: Dena Pane     Examination Guidelines: A complete evaluation includes B-mode imaging,  spectral  Doppler, color Doppler, and power Doppler  as needed of all accessible  portions  of each vessel. Bilateral testing is considered an integral part of a  complete  examination. Limited examinations for reoccurring indications may be  performed  as noted.      +-----------+--------+-----+--------+-----------+-------------------+  LEFT      PSV cm/sRatioStenosisWaveform   Comments             +-----------+--------+-----+--------+-----------+-------------------+  CFA Mid    111                  biphasic                        +-----------+--------+-----+--------+-----------+-------------------+  DFA       34  biphasic                        +-----------+--------+-----+--------+-----------+-------------------+  SFA Prox   29                   triphasic                       +-----------+--------+-----+--------+-----------+-------------------+  SFA Mid    19                   multiphasic                     +-----------+--------+-----+--------+-----------+-------------------+  SFA Distal 38                   mutliphasic                     +-----------+--------+-----+--------+-----------+-------------------+  POP Prox   35                   monophasic                      +-----------+--------+-----+--------+-----------+-------------------+  POP Mid                 occluded                                +-----------+--------+-----+--------+-----------+-------------------+  POP Distal              occluded                                +-----------+--------+-----+--------+-----------+-------------------+  TP Trunk                                   Not well visualized  +-----------+--------+-----+--------+-----------+-------------------+  ATA Distal 16                   monophasic                      +-----------+--------+-----+--------+-----------+-------------------+  PTA Prox   15                   monophasic                       +-----------+--------+-----+--------+-----------+-------------------+  PTA Mid                                    Not well visualized  +-----------+--------+-----+--------+-----------+-------------------+  PTA Distal 18                   monophasic                      +-----------+--------+-----+--------+-----------+-------------------+  PERO Distal32                   monophasic                      +-----------+--------+-----+--------+-----------+-------------------+     Summary:  Left: The mid and distal popliteal artery are occluded. The tibioperoneal  trunk is not  well visualized. Monophasic flow is visualized in the calf  vessels. Atherosclerosis is seen throughout the left lower extremity.     See table(s) above for measurements and observations.   Debby SAILOR. Magda, MD [facility] Phone Number: [phone] 01/14/2024 4:13 PM   Total time spent on preparing this encounter including chart review, data review, collecting history, examining the patient, and coordinating care: 30 minutes  Portions of this report may have been transcribed using voice recognition software.  Every effort has been made to ensure accuracy; however, inadvertent computerized transcription errors may still be present.

## 2024-01-14 NOTE — H&P (View-Only) (Signed)
 [facility] [place]  ASSESSMENT / PLAN: 73 y.o. male with atherosclerosis of native arteries of left leg causing ischemic ulceration. Status post left femoral endarterectomy 12/11/23. His left leg has unfortunately deteriorated postoperatively despite patency of his endarterectomy by duplex.   Recommend:  Abstinence from all tobacco products. Blood glucose control with goal A1c < 7%. Blood pressure control with goal blood pressure < 130/80 mmHg. Lipid reduction therapy with goal LDL-C < 55 mg/dL. Aspirin  81mg  by mouth daily. Atorvastatin  40-80mg  PO QD (or other high intensity statin therapy).   Plan left lower extremity angiogram with possible intervention via right common femoral approach in cath lab. Will use CO2 angiography.  I am hopeful we can find a distal anastomotic target for bypass surgery  CHIEF COMPLAINT: post op  HISTORY OF PRESENT ILLNESS: William Perkins is a 73 y.o. male who returns to clinic for evaluation of ischemic rest pain of the left lower extremity. Patient is well-known to me, having undergone a right iliofemoral endarterectomy in 2022 for ischemic rest pain. He is initially referred for evaluation of right subclavian artery stenosis identified on carotid artery duplex. From the right upper extremity standpoint, the patient is asymptomatic. He reports no claudication, dizziness, rest pain, ulceration of the right upper extremity. The left lower extremity is incredibly painful to him. The pain is very typical of ischemic rest pain. The pain is about the metatarsal bones and is constant. The pain awakens him from sleep. I reviewed the plan for angiogram.   01/14/24: Patient returns to clinic for postoperative evaluation after left femoral endarterectomy.  Unfortunately his left foot has deteriorated significantly.  He reported initial symptom relief in the hospital, but has progressed and has new ischemic ulceration of the toes of the left foot.  I  counseled him about the worrisome nature of this finding, based on her previous angiogram results.  I counseled him that redo angiogram is probably the best next step to try to avoid limb loss.  I counseled him that he is at extreme risk for limb loss and he is understandably upset about this.  Past Medical History:  Diagnosis Date   Cancer (HCC) 02/2023   laryngeal cancer   Chronic renal insufficiency 03/21/2006   CrCl <50 ml   DDD (degenerative disc disease), cervical    GERD 03/22/2006   Qualifier: Diagnosis of  By: [name]     GERD (gastroesophageal reflux disease) 03/22/2006   Hand numbness 10/07/2009   pt states not any longer   Hyperlipidemia 03/22/2008   Hypertension    Hypertension 03/22/2006   Left ventricular hypertrophy 03/22/2006   Leg pain, left 11/15/2009   Lumbar strain 07/15/2008   Renal insufficiency    Soft tissue disorder 08/2008   Tobacco abuse 03/22/2006   2 ppd x 30 years    Past Surgical History:  Procedure Laterality Date   ABDOMINAL AORTOGRAM N/A 12/06/2023   Procedure: ABDOMINAL AORTOGRAM;  Surgeon: Magda Debby SAILOR, MD;  Location: [facility];  Service: Cardiovascular;  Laterality: N/A;   ABDOMINAL AORTOGRAM W/LOWER EXTREMITY N/A 05/18/2020   Procedure: ABDOMINAL AORTOGRAM W/LOWER EXTREMITY;  Surgeon: Magda Debby SAILOR, MD;  Location: [facility];  Service: Cardiovascular;  Laterality: N/A;   BACK SURGERY  2020   PLIF   COLONOSCOPY     DIRECT LARYNGOSCOPY N/A 01/24/2023   Procedure: DIRECT LARYNGOSCOPY;  Surgeon: Tobie Eldora NOVAK, MD;  Location: [facility] OR;  Service: ENT;  Laterality: N/A;  DIRECT  LARYNGOSCOPY WITH BIOPSY OF VOCAL CORD LESION   ENDARTERECTOMY FEMORAL Right 05/20/2020   Procedure: RIGHT EXTENDED FEMORAL ENDARTERECTOMY AND PROFUNDAPLASTY;  Surgeon: Magda Debby SAILOR, MD;  Location: [facility] OR;  Service: Vascular;  Laterality: Right;   ENDARTERECTOMY FEMORAL Left 12/11/2023   Procedure: ENDARTERECTOMY, FEMORAL;  Surgeon:  Magda Debby SAILOR, MD;  Location: [facility] OR;  Service: Vascular;  Laterality: Left;   LOWER EXTREMITY ANGIOGRAPHY N/A 12/06/2023   Procedure: Lower Extremity Angiography;  Surgeon: Magda Debby SAILOR, MD;  Location: [facility];  Service: Cardiovascular;  Laterality: N/A;   WISDOM TOOTH EXTRACTION      Family History  Problem Relation Age of Onset   Diabetes Mother    Diabetes Sister    Diabetes Brother    Heart disease Brother    Rectal cancer Neg Hx    Stomach cancer Neg Hx    Colon cancer Neg Hx     Social History   Socioeconomic History   Marital status: Married    Spouse name: Anette   Number of children: 0   Years of education: 12   Highest education level: Not on file  Occupational History   Occupation: Retired  Tobacco Use   Smoking status: Former    Current packs/day: 0.00    Average packs/day: 1 pack/day for 44.0 years (44.0 ttl pk-yrs)    Types: Cigarettes    Start date: 11/21/1969    Quit date: 11/21/2013    Years since quitting: 10.1   Smokeless tobacco: Never   Tobacco comments:    Stopped 11/21/2013   Vaping Use   Vaping status: Never Used  Substance and Sexual Activity   Alcohol use: No    Alcohol/week: 0.0 standard drinks of alcohol    Comment: Quit 2015   Drug use: No   Sexual activity: Yes    Partners: Female  Other Topics Concern   Not on file  Social History Narrative   Not on file   Social Drivers of Health   Financial Resource Strain: Low Risk  (11/27/2023)   Overall Financial Resource Strain (CARDIA)    Difficulty of Paying Living Expenses: Not hard at all  Food Insecurity: No Food Insecurity (12/13/2023)   Hunger Vital Sign    Worried About Running Out of Food in the Last Year: Never true    Ran Out of Food in the Last Year: Never true  Transportation Needs: No Transportation Needs (12/13/2023)   PRAPARE - Administrator, Civil Service (Medical): No    Lack of Transportation (Non-Medical): No  Physical Activity:  Insufficiently Active (11/27/2023)   Exercise Vital Sign    Days of Exercise per Week: 7 days    Minutes of Exercise per Session: 20 min  Stress: No Stress Concern Present (11/27/2023)   [employer] of Occupational Health - Occupational Stress Questionnaire    Feeling of Stress: Not at all  Social Connections: Moderately Isolated (11/27/2023)   Social Connection and Isolation Panel    Frequency of Communication with Friends and Family: Once a week    Frequency of Social Gatherings with Friends and Family: Three times a week    Attends Religious Services: Never    Active Member of Clubs or Organizations: No    Attends Banker Meetings: Never    Marital Status: Married  Catering Manager Violence: Not At Risk (12/13/2023)   Humiliation, Afraid, Rape, and Kick questionnaire    Fear of Current or Ex-Partner: No    Emotionally  Abused: No    Physically Abused: No    Sexually Abused: No    No Known Allergies  Current Outpatient Medications  Medication Sig Dispense Refill   acetaminophen  (TYLENOL ) 500 MG tablet Take 1,000 mg by mouth every 6 (six) hours as needed for mild pain or moderate pain.     amLODipine  (NORVASC ) 10 MG tablet TAKE 1 TABLET(10 MG) BY MOUTH DAILY 90 tablet 3   aspirin  EC 81 MG tablet Take 81 mg by mouth daily. Swallow whole.     atorvastatin  (LIPITOR) 80 MG tablet Take 1 tablet (80 mg total) by mouth daily. 90 tablet 3   cloNIDine  (CATAPRES ) 0.1 MG tablet Take 1 tablet (0.1 mg total) by mouth 2 (two) times daily. 180 tablet 3   HYDROcodone -acetaminophen  (NORCO/VICODIN) 5-325 MG tablet Take 1 tablet by mouth every 6 (six) hours as needed for moderate pain (pain score 4-6). (Patient not taking: Reported on 01/14/2024) 30 tablet 0   lidocaine  (XYLOCAINE ) 2 % solution Patient: Mix 1part 2% viscous lidocaine , 1part H20. Swallow 10mL of diluted mixture, before meals and at bedtime, up to QID (Patient not taking: Reported on 01/14/2024) 200 mL 3    lisinopril  (ZESTRIL ) 40 MG tablet Take 1 tablet (40 mg total) by mouth daily. 90 tablet 3   oxyCODONE -acetaminophen  (PERCOCET/ROXICET) 5-325 MG tablet Take 1 tablet by mouth every 6 (six) hours as needed for severe pain (pain score 7-10). (Patient not taking: Reported on 01/14/2024) 20 tablet 0   polyethylene glycol (MIRALAX  / GLYCOLAX ) 17 g packet Take 17 g by mouth 2 (two) times daily. (Patient taking differently: Take 17 g by mouth daily as needed for mild constipation or moderate constipation.) 14 each 25   sodium bicarbonate  650 MG tablet Take 2 tablets (1,300 mg total) by mouth 2 (two) times daily. 120 tablet 5   hydrALAZINE  (APRESOLINE ) 50 MG tablet Take 50 mg by mouth daily at 12 noon. (Patient not taking: Reported on 01/14/2024)     rosuvastatin  (CRESTOR ) 20 MG tablet Take 20 mg by mouth at bedtime. (Patient not taking: Reported on 01/14/2024)     No current facility-administered medications for this visit.    PHYSICAL EXAM Vitals:   01/14/24 1349  BP: (!) 159/81  Pulse: 75  Temp: 98.2 F (36.8 C)  SpO2: 96%  Weight: 128 lb (58.1 kg)  Height: 5' 11 (1.803 m)   Elderly man in no distress Regular rate and rhythm Unlabored breathing Left leg ischemic about the foot.  There is mild erythema around the forefoot.  He has ischemic ulceration of the toes of the left foot.  PERTINENT LABORATORY AND RADIOLOGIC DATA  Most recent CBC    Latest Ref Rng & Units 12/12/2023    6:43 AM 12/11/2023   10:10 AM 12/06/2023   10:40 AM  CBC  WBC 4.0 - 10.5 K/uL 7.3  5.5    Hemoglobin 13.0 - 17.0 g/dL 9.9  89.1  88.3   Hematocrit 39.0 - 52.0 % 30.4  34.3  34.0   Platelets 150 - 400 K/uL 239  252       Most recent CMP    Latest Ref Rng & Units 12/12/2023    6:43 AM 12/11/2023   10:10 AM 12/06/2023   10:40 AM  CMP  Glucose 70 - 99 mg/dL 84  870  875   BUN 8 - 23 mg/dL 18  21  27    Creatinine 0.61 - 1.24 mg/dL 7.60  7.64  7.59   Sodium  135 - 145 mmol/L 137  135  139   Potassium 3.5 - 5.1  mmol/L 3.8  4.1  4.2   Chloride 98 - 111 mmol/L 100  101  104   CO2 22 - 32 mmol/L 21  23    Calcium  8.9 - 10.3 mg/dL 8.7  9.1    Total Protein 6.5 - 8.1 g/dL  6.5    Total Bilirubin 0.0 - 1.2 mg/dL  0.7    Alkaline Phos 38 - 126 U/L  73    AST 15 - 41 U/L  26    ALT 0 - 44 U/L  10      Renal function CrCl cannot be calculated (Patient's most recent lab result is older than the maximum 21 days allowed.).  Hemoglobin A1C (%)  Date Value  09/16/2023 6.9 (A)   Hgb A1c MFr Bld (%)  Date Value  08/17/2021 6.3 (H)    LDL Chol Calc (NIH)  Date Value Ref Range Status  09/16/2023 84 0 - 99 mg/dL Final     +-------+-----------+-----------+------------+------------+  ABI/TBIToday's ABIToday's TBIPrevious ABIPrevious TBI  +-------+-----------+-----------+------------+------------+  Right Burket         0.21       Burden          0.25          +-------+-----------+-----------+------------+------------+  Left  [place]         absent     0.26        0             +-------+-----------+-----------+------------+------------+   LOWER EXTREMITY ARTERIAL DUPLEX STUDY   Patient Name:  MARSHALL KAMPF  Date of Exam:   01/14/2024  Medical Rec #: [id number]       Accession #:    [id number]  Date of Birth: Jul 20, 1950       Patient Gender: M  Patient Age:   72 years  Exam Location:  [address]  Procedure:      VAS US  LOWER EXTREMITY ARTERIAL DUPLEX  Referring Phys: DEBBY ROBERTSON    ---------------------------------------------------------------------------  -----    Indications: Rest pain, and atherosclerosis of native arteries.   High Risk Factors: Hypertension, hyperlipidemia, Diabetes, past history of                     smoking.     Current ABI: right: [place]               left: [place]   Comparison Study: N/A   Performing Technologist: Dena Pane     Examination Guidelines: A complete evaluation includes B-mode imaging,  spectral  Doppler, color Doppler, and power Doppler  as needed of all accessible  portions  of each vessel. Bilateral testing is considered an integral part of a  complete  examination. Limited examinations for reoccurring indications may be  performed  as noted.      +-----------+--------+-----+--------+-----------+-------------------+  LEFT      PSV cm/sRatioStenosisWaveform   Comments             +-----------+--------+-----+--------+-----------+-------------------+  CFA Mid    111                  biphasic                        +-----------+--------+-----+--------+-----------+-------------------+  DFA       34  biphasic                        +-----------+--------+-----+--------+-----------+-------------------+  SFA Prox   29                   triphasic                       +-----------+--------+-----+--------+-----------+-------------------+  SFA Mid    19                   multiphasic                     +-----------+--------+-----+--------+-----------+-------------------+  SFA Distal 38                   mutliphasic                     +-----------+--------+-----+--------+-----------+-------------------+  POP Prox   35                   monophasic                      +-----------+--------+-----+--------+-----------+-------------------+  POP Mid                 occluded                                +-----------+--------+-----+--------+-----------+-------------------+  POP Distal              occluded                                +-----------+--------+-----+--------+-----------+-------------------+  TP Trunk                                   Not well visualized  +-----------+--------+-----+--------+-----------+-------------------+  ATA Distal 16                   monophasic                      +-----------+--------+-----+--------+-----------+-------------------+  PTA Prox   15                   monophasic                       +-----------+--------+-----+--------+-----------+-------------------+  PTA Mid                                    Not well visualized  +-----------+--------+-----+--------+-----------+-------------------+  PTA Distal 18                   monophasic                      +-----------+--------+-----+--------+-----------+-------------------+  PERO Distal32                   monophasic                      +-----------+--------+-----+--------+-----------+-------------------+     Summary:  Left: The mid and distal popliteal artery are occluded. The tibioperoneal  trunk is not  well visualized. Monophasic flow is visualized in the calf  vessels. Atherosclerosis is seen throughout the left lower extremity.     See table(s) above for measurements and observations.   Debby SAILOR. Magda, MD [facility] Phone Number: [phone] 01/14/2024 4:13 PM   Total time spent on preparing this encounter including chart review, data review, collecting history, examining the patient, and coordinating care: 30 minutes  Portions of this report may have been transcribed using voice recognition software.  Every effort has been made to ensure accuracy; however, inadvertent computerized transcription errors may still be present.

## 2024-01-15 LAB — VAS US ABI WITH/WO TBI

## 2024-01-16 DIAGNOSIS — L97529 Non-pressure chronic ulcer of other part of left foot with unspecified severity: Secondary | ICD-10-CM

## 2024-01-16 DIAGNOSIS — I70245 Atherosclerosis of native arteries of left leg with ulceration of other part of foot: Secondary | ICD-10-CM | POA: Diagnosis not present

## 2024-01-16 NOTE — Op Note (Incomplete)
 DATE OF SERVICE: 01/17/2024  PATIENT:  William Perkins  73 y.o. male  PRE-OPERATIVE DIAGNOSIS:  Atherosclerosis of native arteries of left lower extremity causing ulceration  POST-OPERATIVE DIAGNOSIS:  Same  PROCEDURE:   1) Ultrasound guided right common femoral artery access (CPT [phone]) 2) Left lower extremity angiogram with second order cannulation (CPT 36246) 3) Conscious sedation (52 minutes) (CPT 99152)  SURGEON:  Debby SAILOR. Magda, MD  ASSISTANT: none  ANESTHESIA:   local and IV sedation  ESTIMATED BLOOD LOSS: min  LOCAL MEDICATIONS USED:  LIDOCAINE    COUNTS: confirmed correct.  PATIENT DISPOSITION:  PACU - hemodynamically stable.   Delay start of Pharmacological VTE agent (>24hrs) due to surgical blood loss or risk of bleeding: no  INDICATION FOR PROCEDURE: William Perkins is a 73 y.o. male with worsening ischemia of his left foot after femoral endarterectomy. After careful discussion of risks, benefits, and alternatives the patient was offered angiogram. The patient understood and wished to proceed.  OPERATIVE FINDINGS:   Left Lower Extremity Angiogram:  External iliac artery: patent  Common femoral artery: patent  Profunda femoris artery: patent  Superficial femoral artery: heavily diseased and calcified. Multifocal stenosis, greatest 75%.  Popliteal artery: heavily diseased and calcified. CTO above and behind the knee. Below knee popliteal artery patent, but with stenosis of >70%  Anterior tibial artery: occluded proximally; heavily calcified. Reconstitutes as a heavily diseased artery in the mid calf and courses to the foot. No bypass target seen.  Tibioperoneal trunk: patent, but heavily diseased  Peroneal artery: patent, but heavily diseased and calcified. No bypass target.  Posterior tibial artery: occluded  Pedal circulation: severely disadvantaged  GLASS score. FP: 4. IP: 4  WIfI score. Wound: 2; ischemia: 3; infection: 1. Stage: 4  DESCRIPTION OF  PROCEDURE: After identification of the patient in the pre-operative holding area, the patient was transferred to the operating room. The patient was positioned supine on the operating room table.  Anesthesia was induced. The groins was prepped and draped in standard fashion. A surgical pause was performed confirming correct patient, procedure, and operative location.  The right groin was anesthetized with subcutaneous injection of 1% lidocaine . Using ultrasound guidance, the right common femoral artery was accessed with micropuncture technique.  Fluoroscopy was used to confirm cannulation over the femoral head. The 761F micropuncture sheath was upsized to 61F.   A Benson wire was advanced into the distal aorta. Over the wire an omni flush catheter was advanced to the level of L2. Aortogram was performed - see above for details.   The left common iliac artery was selected with an omniflush catheter and glidewire guidewire. The wire was advanced into the common femoral artery. Over the wire the omni flush catheter was advanced into the external iliac artery. Selective angiography was performed - see above for details.   The decision was made to intervene. The patient was heparinized with 6000 units of heparin . The 61F sheath was exchanged for a 42F x45cm sheath. Selective angiography of the left lower extremity performed prior to intervention.   I attempted to cross the lesions.  I was able to track a catheter and wire system into the behind the popliteal artery, but could not cross any further.  We stopped here.  Perclose was used to close the arteriotomy. Hemostasis was excellent upon completion.   Conscious sedation was administered with the use of IV fentanyl  and midazolam  under continuous physician and nurse monitoring.  Heart rate, blood pressure, and oxygen saturation were continuously  monitored.  Total sedation time was 52 minutes  Upon completion of the case instrument and sharps counts were  confirmed correct. The patient was transferred to the PACU in good condition. I was present for all portions of the procedure.  PLAN: Optimal medical therapy for peripheral arterial disease.  Needs a left above-knee amputation or transition to comfort measures.  Debby SAILOR. Magda, MD [facility] Phone Number: [phone] 01/17/2024 4:06 PM

## 2024-01-17 ENCOUNTER — Other Ambulatory Visit: Payer: Self-pay

## 2024-01-17 ENCOUNTER — Ambulatory Visit (HOSPITAL_COMMUNITY)
Admission: RE | Admit: 2024-01-17 | Discharge: 2024-01-17 | Disposition: A | Discharge status: Home / Self Care | Attending: Vascular Surgery | Admitting: Vascular Surgery

## 2024-01-17 ENCOUNTER — Encounter (HOSPITAL_COMMUNITY)
Admission: RE | Disposition: A | Discharge status: Home / Self Care | Payer: Self-pay | Source: Home / Self Care | Attending: Vascular Surgery

## 2024-01-17 DIAGNOSIS — Z87891 Personal history of nicotine dependence: Secondary | ICD-10-CM | POA: Diagnosis not present

## 2024-01-17 DIAGNOSIS — I7092 Chronic total occlusion of artery of the extremities: Secondary | ICD-10-CM

## 2024-01-17 DIAGNOSIS — L97529 Non-pressure chronic ulcer of other part of left foot with unspecified severity: Secondary | ICD-10-CM | POA: Insufficient documentation

## 2024-01-17 DIAGNOSIS — Z79899 Other long term (current) drug therapy: Secondary | ICD-10-CM | POA: Diagnosis not present

## 2024-01-17 DIAGNOSIS — I70245 Atherosclerosis of native arteries of left leg with ulceration of other part of foot: Secondary | ICD-10-CM | POA: Insufficient documentation

## 2024-01-17 DIAGNOSIS — Z7982 Long term (current) use of aspirin: Secondary | ICD-10-CM | POA: Diagnosis not present

## 2024-01-17 HISTORY — PX: LOWER EXTREMITY ANGIOGRAPHY: CATH118251

## 2024-01-17 HISTORY — PX: ABDOMINAL AORTOGRAM W/LOWER EXTREMITY: CATH118223

## 2024-01-17 LAB — POCT I-STAT, CHEM 8
BUN: 37 mg/dL — ABNORMAL HIGH (ref 8–23)
Calcium, Ion: 1.2 mmol/L (ref 1.15–1.40)
Chloride: 102 mmol/L (ref 98–111)
Creatinine, Ser: 2.2 mg/dL — ABNORMAL HIGH (ref 0.61–1.24)
Glucose, Bld: 133 mg/dL — ABNORMAL HIGH (ref 70–99)
HCT: 30 % — ABNORMAL LOW (ref 39.0–52.0)
Hemoglobin: 10.2 g/dL — ABNORMAL LOW (ref 13.0–17.0)
Potassium: 4.2 mmol/L (ref 3.5–5.1)
Sodium: 137 mmol/L (ref 135–145)
TCO2: 25 mmol/L (ref 22–32)

## 2024-01-17 LAB — GLUCOSE, CAPILLARY: Glucose-Capillary: 106 mg/dL — ABNORMAL HIGH (ref 70–99)

## 2024-01-17 SURGERY — ABDOMINAL AORTOGRAM W/LOWER EXTREMITY
Anesthesia: LOCAL

## 2024-01-17 MED ORDER — LIDOCAINE-EPINEPHRINE 1 %-1:100000 IJ SOLN
INTRAMUSCULAR | Status: AC
  Filled 2024-01-17: qty 1

## 2024-01-17 MED ORDER — MIDAZOLAM HCL 2 MG/2ML IJ SOLN
INTRAMUSCULAR | Status: AC
  Filled 2024-01-17: qty 2

## 2024-01-17 MED ORDER — HEPARIN SODIUM (PORCINE) 1000 UNIT/ML IJ SOLN
INTRAMUSCULAR | Status: DC | PRN
Start: 2024-01-17 — End: 2024-01-17
  Administered 2024-01-17: 6000 [IU] via INTRAVENOUS

## 2024-01-17 MED ORDER — HYDROCODONE-ACETAMINOPHEN 5-325 MG PO TABS
1.0000 | ORAL_TABLET | Freq: Once | ORAL | Status: AC
  Administered 2024-01-17: 1 via ORAL
  Filled 2024-01-17: qty 1

## 2024-01-17 MED ORDER — LIDOCAINE HCL (PF) 1 % IJ SOLN
INTRAMUSCULAR | Status: DC | PRN
Start: 2024-01-17 — End: 2024-01-17
  Administered 2024-01-17: 20 mL via INTRADERMAL

## 2024-01-17 MED ORDER — LABETALOL HCL 5 MG/ML IV SOLN: 10.0000 mg | INTRAVENOUS | Status: DC | PRN

## 2024-01-17 MED ORDER — HEPARIN SODIUM (PORCINE) 1000 UNIT/ML IJ SOLN
INTRAMUSCULAR | Status: AC
  Filled 2024-01-17: qty 10

## 2024-01-17 MED ORDER — MIDAZOLAM HCL (PF) 2 MG/2ML IJ SOLN
INTRAMUSCULAR | Status: DC | PRN
  Administered 2024-01-17: 1 mg via INTRAVENOUS

## 2024-01-17 MED ORDER — ONDANSETRON HCL 4 MG/2ML IJ SOLN: 4.0000 mg | Freq: Four times a day (QID) | INTRAMUSCULAR | Status: DC | PRN

## 2024-01-17 MED ORDER — SODIUM CHLORIDE 0.9% FLUSH: 3.0000 mL | Freq: Two times a day (BID) | INTRAVENOUS | Status: DC

## 2024-01-17 MED ORDER — ACETAMINOPHEN 325 MG PO TABS: 650.0000 mg | ORAL_TABLET | ORAL | Status: DC | PRN

## 2024-01-17 MED ORDER — SODIUM CHLORIDE 0.9% FLUSH: 3.0000 mL | INTRAVENOUS | Status: DC | PRN

## 2024-01-17 MED ORDER — LIDOCAINE HCL (PF) 1 % IJ SOLN
INTRAMUSCULAR | Status: AC
Start: 2024-01-17 — End: 2024-01-17
  Filled 2024-01-17: qty 30

## 2024-01-17 MED ORDER — FENTANYL CITRATE (PF) 100 MCG/2ML IJ SOLN
INTRAMUSCULAR | Status: AC
  Filled 2024-01-17: qty 2

## 2024-01-17 MED ORDER — SODIUM CHLORIDE 0.9 % IV SOLN: 250.0000 mL | INTRAVENOUS | Status: DC | PRN

## 2024-01-17 MED ORDER — HYDRALAZINE HCL 20 MG/ML IJ SOLN: 5.0000 mg | INTRAMUSCULAR | Status: DC | PRN

## 2024-01-17 MED ORDER — HEPARIN (PORCINE) IN NACL 1000-0.9 UT/500ML-% IV SOLN
INTRAVENOUS | Status: DC | PRN
Start: 2024-01-17 — End: 2024-01-17
  Administered 2024-01-17: 1000 mL

## 2024-01-17 MED ORDER — SODIUM CHLORIDE 0.9 % IV SOLN: INTRAVENOUS | Status: DC

## 2024-01-17 MED ORDER — IODIXANOL 320 MG/ML IV SOLN
INTRAVENOUS | Status: DC | PRN
  Administered 2024-01-17: 50 mL via INTRA_ARTERIAL

## 2024-01-17 MED ORDER — FENTANYL CITRATE (PF) 100 MCG/2ML IJ SOLN
INTRAMUSCULAR | Status: DC | PRN
  Administered 2024-01-17 (×2): 50 ug via INTRAVENOUS

## 2024-01-17 MED ORDER — LIDOCAINE-EPINEPHRINE 1 %-1:100000 IJ SOLN
INTRAMUSCULAR | Status: DC | PRN
  Administered 2024-01-17: 3 mL

## 2024-01-17 SURGICAL SUPPLY — 16 items
CATH NAVICROSS ST 65CM (CATHETERS) IMPLANT
CATH OMNI FLUSH 5F 65CM (CATHETERS) IMPLANT
CATH QUICKCROSS .035X135CM (MICROCATHETER) IMPLANT
CLOSURE PERCLOSE PROSTYLE (Vascular Products) IMPLANT
GLIDEWIRE ADV .035X260CM (WIRE) IMPLANT
GUIDEWIRE ANGLED .035 180CM (WIRE) IMPLANT
KIT MICROPUNCTURE NIT STIFF (SHEATH) IMPLANT
KIT SINGLE USE MANIFOLD (KITS) IMPLANT
SET ATX-X65L (MISCELLANEOUS) IMPLANT
SHEATH CATAPULT 6FR 45 (SHEATH) IMPLANT
SHEATH PINNACLE 5F 10CM (SHEATH) IMPLANT
SHEATH PROBE COVER 6X72 (BAG) IMPLANT
TRAY PV CATH (CUSTOM PROCEDURE TRAY) ×2 IMPLANT
WIRE BENTSON .035X145CM (WIRE) IMPLANT
WIRE SHEPHERD 12G .018 (WIRE) IMPLANT
WIRE SHEPHERD 6G .018 (WIRE) IMPLANT

## 2024-01-17 NOTE — Progress Notes (Signed)
 Patient and patient wife given discharge instructions, education provided no further questions at this time. Patient able to ambulate and void before discharge. Able to tolerate PO intake. Patient site is clean, dry, intact with no hematoma noted upon discharge.

## 2024-01-17 NOTE — Interval H&P Note (Signed)
 History and Physical Interval Note:  01/17/2024 1:41 PM  William Perkins  has presented today for surgery, with the diagnosis of atherosclerosis bilateral lower extremity.  The various methods of treatment have been discussed with the patient and family. After consideration of risks, benefits and other options for treatment, the patient has consented to  Procedure(s): ABDOMINAL AORTOGRAM W/LOWER EXTREMITY (N/A) Lower Extremity Angiography (N/A) as a surgical intervention.  The patient's history has been reviewed, patient examined, no change in status, stable for surgery.  I have reviewed the patient's chart and labs.  Questions were answered to the patient's satisfaction.     Debby LOISE Robertson

## 2024-01-20 ENCOUNTER — Other Ambulatory Visit: Payer: Self-pay

## 2024-01-20 ENCOUNTER — Encounter (HOSPITAL_COMMUNITY): Payer: Self-pay | Admitting: Vascular Surgery

## 2024-01-20 DIAGNOSIS — I70222 Atherosclerosis of native arteries of extremities with rest pain, left leg: Secondary | ICD-10-CM

## 2024-01-21 ENCOUNTER — Encounter (HOSPITAL_COMMUNITY): Payer: Self-pay | Admitting: Vascular Surgery

## 2024-01-21 ENCOUNTER — Other Ambulatory Visit: Payer: Self-pay

## 2024-01-21 NOTE — Progress Notes (Signed)
 Anesthesia Chart Review: Same day workup  73 year old male follows with vascular surgery for history of PAD s/p right extended femoral endarterectomy 2022, asymptomatic right subclavian artery stenosis.  Recently seen by Dr. Magda with complaint of rest pain in the LLE.  Subsequent angiogram 12/06/2023 showed significant stenosis and extended left femoral endarterectomy was recommended.  He did subsequently undergo left femoral endarterectomy on 12/11/2023 without complication.  On follow-up with Dr. Magda on 01/14/2024 his left leg was noted to have deteriorated postoperatively despite patency of his endarterectomy.  He had angiogram 01/17/2024 no bypass target; left above-knee amputation recommended.   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  Noted to be doing well when seen by Dr. Tobie on 07/08/2023.  In office laryngoscopy at that time showed,  The nasal cavity and nasopharynx did not reveal any masses or lesions, mucosa appeared to be without obvious lesions. The tongue base, pharyngeal walls, piriform sinuses, vallecula, epiglottis and postcricoid region are normal in appearance without masses; The visualized portion of the subglottis and proximal trachea is widely patent. The vocal folds are mobile bilaterally - resolution of prior mass, with no mucosal changes seen today concerning for recurrence (slight erythema right VF); left vocal fold without lesions.   Other pertinent history includes former smoker (44 pack years, quit 2015), GERD, CKD 4.   He will need day of surgery labs and evaluation.   EKG 01/17/2024: Sinus rhythm with 1st degree A-V block with Premature atrial complexes.  Rate 78. Left axis deviation. Non-specific intra-ventricular conduction delay. Cannot rule out Septal infarct , age undetermined   Carotid duplex 09/18/2023: Summary:  Right Carotid: Velocities  in the right ICA are consistent with a 40-59% stenosis. The ECA appears <50% stenosed.  Left Carotid: Velocities in the left ICA are consistent with a 1-39% stenosis. The ECA appears <50% stenosed.  Vertebrals: Bilateral vertebral arteries demonstrate antegrade flow.     Lynwood Geofm RIGGERS [facility] Short Stay Center/Anesthesiology Phone [phone] 01/21/2024 11:30 AM

## 2024-01-21 NOTE — Anesthesia Preprocedure Evaluation (Signed)
 Anesthesia Evaluation  Patient identified by MRN, date of birth, ID band Patient awake    Reviewed: Allergy & Precautions, NPO status , Patient's Chart, lab work & pertinent test results  History of Anesthesia Complications Negative for: history of anesthetic complications  Airway Mallampati: II  TM Distance: >3 FB Neck ROM: Full    Dental  (+) Dental Advisory Given, Edentulous Lower, Edentulous Upper   Pulmonary COPD, former smoker   Pulmonary exam normal breath sounds clear to auscultation       Cardiovascular hypertension, Pt. 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, see PAT note  Reproductive/Obstetrics                              Anesthesia Physical Anesthesia Plan  ASA: 3  Anesthesia Plan: General   Post-op Pain Management: Tylenol  PO (pre-op)*, Ketamine IV* and Precedex   Induction: Intravenous  PONV Risk Score and Plan: 2 and Treatment may vary due to age or medical condition, Ondansetron  and Dexamethasone   Airway Management Planned: Oral ETT  Additional Equipment: None  Intra-op Plan:   Post-operative Plan: Extubation in OR  Informed Consent: I have reviewed the patients History and Physical, chart, labs and discussed the procedure including the risks, benefits and alternatives for the proposed anesthesia with the patient or authorized  representative who has indicated his/her understanding and acceptance.     Dental advisory given  Plan Discussed with: CRNA and Anesthesiologist  Anesthesia Plan Comments: (PAT note by Lynwood Hope, PA-C)         Anesthesia Quick Evaluation

## 2024-01-21 NOTE — Progress Notes (Signed)
 SDW CALL  Patient was given pre-op instructions over the phone. The opportunity was given for the patient to ask questions. No further questions asked. Patient verbalized understanding of instructions given.   PCP - Rosan Dayton BROCKS, DO Cardiologist - denies  PPM/ICD - denies Device Orders -  Rep Notified -   Chest x-ray - na EKG - 01/17/24 Stress Test - dnies ECHO - 03/18/06 Cardiac Cath - denies  Sleep Study - denies CPAP -   Fasting Blood Sugar - na Checks Blood Sugar _____ times a day  Blood Thinner Instructions:na Aspirin  Instructions:per Dr. Magda lanius may continue taking aspirin   ERAS Protcol -NPO PRE-SURGERY Ensure or G2-   COVID TEST- na   Anesthesia review: yes- HTN,Left ventricular hypertrophy,Chronic renal insufficiency,peripheral arterial disease  Patient denies shortness of breath, fever, cough and chest pain over the phone call    Special instructions:    Oral Hygiene is also important to reduce your risk of infection.  Remember - BRUSH YOUR TEETH THE MORNING OF SURGERY WITH YOUR REGULAR TOOTHPASTE

## 2024-01-22 ENCOUNTER — Inpatient Hospital Stay (HOSPITAL_COMMUNITY): Admitting: Vascular Surgery

## 2024-01-22 ENCOUNTER — Inpatient Hospital Stay (HOSPITAL_COMMUNITY)
Admission: AD | Admit: 2024-01-22 | Discharge: 2024-02-03 | DRG: 239 | Disposition: E | Attending: Pulmonary Disease | Admitting: Pulmonary Disease

## 2024-01-22 ENCOUNTER — Encounter (HOSPITAL_COMMUNITY): Payer: Self-pay | Admitting: Vascular Surgery

## 2024-01-22 ENCOUNTER — Inpatient Hospital Stay (HOSPITAL_COMMUNITY)

## 2024-01-22 ENCOUNTER — Encounter (HOSPITAL_COMMUNITY): Admission: AD | Disposition: E | Payer: Self-pay | Source: Home / Self Care | Attending: Pulmonary Disease

## 2024-01-22 DIAGNOSIS — N184 Chronic kidney disease, stage 4 (severe): Secondary | ICD-10-CM | POA: Diagnosis present

## 2024-01-22 DIAGNOSIS — N170 Acute kidney failure with tubular necrosis: Secondary | ICD-10-CM | POA: Diagnosis not present

## 2024-01-22 DIAGNOSIS — K7200 Acute and subacute hepatic failure without coma: Secondary | ICD-10-CM

## 2024-01-22 DIAGNOSIS — K219 Gastro-esophageal reflux disease without esophagitis: Secondary | ICD-10-CM | POA: Diagnosis present

## 2024-01-22 DIAGNOSIS — Z87891 Personal history of nicotine dependence: Secondary | ICD-10-CM | POA: Diagnosis not present

## 2024-01-22 DIAGNOSIS — L97929 Non-pressure chronic ulcer of unspecified part of left lower leg with unspecified severity: Secondary | ICD-10-CM

## 2024-01-22 DIAGNOSIS — J9601 Acute respiratory failure with hypoxia: Secondary | ICD-10-CM | POA: Diagnosis not present

## 2024-01-22 DIAGNOSIS — I10 Essential (primary) hypertension: Secondary | ICD-10-CM

## 2024-01-22 DIAGNOSIS — Z66 Do not resuscitate: Secondary | ICD-10-CM | POA: Diagnosis not present

## 2024-01-22 DIAGNOSIS — E1165 Type 2 diabetes mellitus with hyperglycemia: Secondary | ICD-10-CM | POA: Diagnosis not present

## 2024-01-22 DIAGNOSIS — D684 Acquired coagulation factor deficiency: Secondary | ICD-10-CM | POA: Diagnosis present

## 2024-01-22 DIAGNOSIS — I447 Left bundle-branch block, unspecified: Secondary | ICD-10-CM | POA: Diagnosis present

## 2024-01-22 DIAGNOSIS — E11621 Type 2 diabetes mellitus with foot ulcer: Secondary | ICD-10-CM | POA: Diagnosis present

## 2024-01-22 DIAGNOSIS — D65 Disseminated intravascular coagulation [defibrination syndrome]: Secondary | ICD-10-CM | POA: Diagnosis not present

## 2024-01-22 DIAGNOSIS — Z9889 Other specified postprocedural states: Secondary | ICD-10-CM | POA: Diagnosis not present

## 2024-01-22 DIAGNOSIS — S81809A Unspecified open wound, unspecified lower leg, initial encounter: Secondary | ICD-10-CM | POA: Diagnosis present

## 2024-01-22 DIAGNOSIS — I708 Atherosclerosis of other arteries: Secondary | ICD-10-CM | POA: Diagnosis present

## 2024-01-22 DIAGNOSIS — R748 Abnormal levels of other serum enzymes: Secondary | ICD-10-CM

## 2024-01-22 DIAGNOSIS — E875 Hyperkalemia: Secondary | ICD-10-CM | POA: Diagnosis not present

## 2024-01-22 DIAGNOSIS — K920 Hematemesis: Secondary | ICD-10-CM | POA: Diagnosis not present

## 2024-01-22 DIAGNOSIS — L97529 Non-pressure chronic ulcer of other part of left foot with unspecified severity: Secondary | ICD-10-CM | POA: Diagnosis present

## 2024-01-22 DIAGNOSIS — E8721 Acute metabolic acidosis: Secondary | ICD-10-CM | POA: Diagnosis not present

## 2024-01-22 DIAGNOSIS — N183 Chronic kidney disease, stage 3 unspecified: Secondary | ICD-10-CM | POA: Diagnosis not present

## 2024-01-22 DIAGNOSIS — I4891 Unspecified atrial fibrillation: Secondary | ICD-10-CM | POA: Diagnosis not present

## 2024-01-22 DIAGNOSIS — D62 Acute posthemorrhagic anemia: Secondary | ICD-10-CM | POA: Diagnosis not present

## 2024-01-22 DIAGNOSIS — R7401 Elevation of levels of liver transaminase levels: Secondary | ICD-10-CM | POA: Diagnosis not present

## 2024-01-22 DIAGNOSIS — J80 Acute respiratory distress syndrome: Secondary | ICD-10-CM | POA: Diagnosis not present

## 2024-01-22 DIAGNOSIS — E1151 Type 2 diabetes mellitus with diabetic peripheral angiopathy without gangrene: Secondary | ICD-10-CM | POA: Diagnosis present

## 2024-01-22 DIAGNOSIS — R571 Hypovolemic shock: Secondary | ICD-10-CM | POA: Diagnosis not present

## 2024-01-22 DIAGNOSIS — I70222 Atherosclerosis of native arteries of extremities with rest pain, left leg: Principal | ICD-10-CM | POA: Diagnosis present

## 2024-01-22 DIAGNOSIS — I472 Ventricular tachycardia, unspecified: Secondary | ICD-10-CM | POA: Diagnosis not present

## 2024-01-22 DIAGNOSIS — J690 Pneumonitis due to inhalation of food and vomit: Secondary | ICD-10-CM | POA: Diagnosis not present

## 2024-01-22 DIAGNOSIS — E1122 Type 2 diabetes mellitus with diabetic chronic kidney disease: Secondary | ICD-10-CM | POA: Diagnosis present

## 2024-01-22 DIAGNOSIS — R001 Bradycardia, unspecified: Secondary | ICD-10-CM | POA: Diagnosis not present

## 2024-01-22 DIAGNOSIS — Z8249 Family history of ischemic heart disease and other diseases of the circulatory system: Secondary | ICD-10-CM

## 2024-01-22 DIAGNOSIS — J180 Bronchopneumonia, unspecified organism: Secondary | ICD-10-CM | POA: Diagnosis not present

## 2024-01-22 DIAGNOSIS — I129 Hypertensive chronic kidney disease with stage 1 through stage 4 chronic kidney disease, or unspecified chronic kidney disease: Secondary | ICD-10-CM | POA: Diagnosis present

## 2024-01-22 DIAGNOSIS — R579 Shock, unspecified: Secondary | ICD-10-CM

## 2024-01-22 DIAGNOSIS — Z923 Personal history of irradiation: Secondary | ICD-10-CM

## 2024-01-22 DIAGNOSIS — D649 Anemia, unspecified: Secondary | ICD-10-CM

## 2024-01-22 DIAGNOSIS — Z7982 Long term (current) use of aspirin: Secondary | ICD-10-CM

## 2024-01-22 DIAGNOSIS — E11649 Type 2 diabetes mellitus with hypoglycemia without coma: Secondary | ICD-10-CM | POA: Diagnosis present

## 2024-01-22 DIAGNOSIS — I97121 Postprocedural cardiac arrest following other surgery: Secondary | ICD-10-CM | POA: Diagnosis not present

## 2024-01-22 DIAGNOSIS — Z8521 Personal history of malignant neoplasm of larynx: Secondary | ICD-10-CM

## 2024-01-22 DIAGNOSIS — Z833 Family history of diabetes mellitus: Secondary | ICD-10-CM

## 2024-01-22 DIAGNOSIS — Y838 Other surgical procedures as the cause of abnormal reaction of the patient, or of later complication, without mention of misadventure at the time of the procedure: Secondary | ICD-10-CM | POA: Diagnosis not present

## 2024-01-22 DIAGNOSIS — I70249 Atherosclerosis of native arteries of left leg with ulceration of unspecified site: Secondary | ICD-10-CM | POA: Diagnosis not present

## 2024-01-22 DIAGNOSIS — E872 Acidosis, unspecified: Secondary | ICD-10-CM | POA: Diagnosis present

## 2024-01-22 DIAGNOSIS — N189 Chronic kidney disease, unspecified: Secondary | ICD-10-CM | POA: Diagnosis not present

## 2024-01-22 DIAGNOSIS — R739 Hyperglycemia, unspecified: Secondary | ICD-10-CM | POA: Diagnosis not present

## 2024-01-22 DIAGNOSIS — Z95820 Peripheral vascular angioplasty status with implants and grafts: Secondary | ICD-10-CM

## 2024-01-22 DIAGNOSIS — J449 Chronic obstructive pulmonary disease, unspecified: Secondary | ICD-10-CM

## 2024-01-22 DIAGNOSIS — Z79899 Other long term (current) drug therapy: Secondary | ICD-10-CM

## 2024-01-22 DIAGNOSIS — Z89612 Acquired absence of left leg above knee: Secondary | ICD-10-CM | POA: Diagnosis not present

## 2024-01-22 DIAGNOSIS — I440 Atrioventricular block, first degree: Secondary | ICD-10-CM | POA: Diagnosis present

## 2024-01-22 DIAGNOSIS — E785 Hyperlipidemia, unspecified: Secondary | ICD-10-CM | POA: Diagnosis present

## 2024-01-22 DIAGNOSIS — I469 Cardiac arrest, cause unspecified: Secondary | ICD-10-CM | POA: Diagnosis not present

## 2024-01-22 DIAGNOSIS — N179 Acute kidney failure, unspecified: Secondary | ICD-10-CM | POA: Diagnosis not present

## 2024-01-22 HISTORY — PX: AMPUTATION: SHX166

## 2024-01-22 LAB — POCT I-STAT EG7
Acid-base deficit: 25 mmol/L — ABNORMAL HIGH (ref 0.0–2.0)
Bicarbonate: 5.3 mmol/L — ABNORMAL LOW (ref 20.0–28.0)
Calcium, Ion: 1.14 mmol/L — ABNORMAL LOW (ref 1.15–1.40)
HCT: 19 % — ABNORMAL LOW (ref 39.0–52.0)
Hemoglobin: 6.5 g/dL — CL (ref 13.0–17.0)
O2 Saturation: 100 %
Patient temperature: 90.2
Potassium: 4.9 mmol/L (ref 3.5–5.1)
Sodium: 135 mmol/L (ref 135–145)
TCO2: 6 mmol/L — ABNORMAL LOW (ref 22–32)
pCO2, Ven: 20.8 mmHg — ABNORMAL LOW (ref 44–60)
pH, Ven: 6.981 — CL (ref 7.25–7.43)
pO2, Ven: 279 mmHg — ABNORMAL HIGH (ref 32–45)

## 2024-01-22 LAB — APTT: aPTT: 36 s (ref 24–36)

## 2024-01-22 LAB — CBC
HCT: 26.2 % — ABNORMAL LOW (ref 39.0–52.0)
HCT: 23.1 % — ABNORMAL LOW (ref 39.0–52.0)
Hemoglobin: 8.3 g/dL — ABNORMAL LOW (ref 13.0–17.0)
Hemoglobin: 7.4 g/dL — ABNORMAL LOW (ref 13.0–17.0)
MCH: 30.1 pg (ref 26.0–34.0)
MCH: 30.5 pg (ref 26.0–34.0)
MCHC: 31.7 g/dL (ref 30.0–36.0)
MCHC: 32 g/dL (ref 30.0–36.0)
MCV: 94.9 fL (ref 80.0–100.0)
MCV: 95.1 fL (ref 80.0–100.0)
Platelets: 345 K/uL (ref 150–400)
Platelets: 286 K/uL (ref 150–400)
RBC: 2.76 MIL/uL — ABNORMAL LOW (ref 4.22–5.81)
RBC: 2.43 MIL/uL — ABNORMAL LOW (ref 4.22–5.81)
RDW: 14.3 % (ref 11.5–15.5)
RDW: 14.2 % (ref 11.5–15.5)
WBC: 8 K/uL (ref 4.0–10.5)
WBC: 8.8 K/uL (ref 4.0–10.5)
nRBC: 0 % (ref 0.0–0.2)
nRBC: 0 % (ref 0.0–0.2)

## 2024-01-22 LAB — COMPREHENSIVE METABOLIC PANEL WITH GFR
ALT: 16 U/L (ref 0–44)
AST: 26 U/L (ref 15–41)
Albumin: 3.2 g/dL — ABNORMAL LOW (ref 3.5–5.0)
Alkaline Phosphatase: 100 U/L (ref 38–126)
Anion gap: 15 (ref 5–15)
BUN: 23 mg/dL (ref 8–23)
CO2: 23 mmol/L (ref 22–32)
Calcium: 9.1 mg/dL (ref 8.9–10.3)
Chloride: 102 mmol/L (ref 98–111)
Creatinine, Ser: 2.3 mg/dL — ABNORMAL HIGH (ref 0.61–1.24)
GFR, Estimated: 29 mL/min — ABNORMAL LOW (ref >60)
Glucose, Bld: 96 mg/dL (ref 70–99)
Potassium: 4.1 mmol/L (ref 3.5–5.1)
Sodium: 140 mmol/L (ref 135–145)
Total Bilirubin: 0.8 mg/dL (ref 0.0–1.2)
Total Protein: 7.3 g/dL (ref 6.5–8.1)

## 2024-01-22 LAB — PROTIME-INR
INR: 1.2 (ref 0.8–1.2)
Prothrombin Time: 16.3 s — ABNORMAL HIGH (ref 11.4–15.2)

## 2024-01-22 LAB — BASIC METABOLIC PANEL WITH GFR
Anion gap: 18 — ABNORMAL HIGH (ref 5–15)
BUN: 25 mg/dL — ABNORMAL HIGH (ref 8–23)
CO2: 19 mmol/L — ABNORMAL LOW (ref 22–32)
Calcium: 8.4 mg/dL — ABNORMAL LOW (ref 8.9–10.3)
Chloride: 103 mmol/L (ref 98–111)
Creatinine, Ser: 2.22 mg/dL — ABNORMAL HIGH (ref 0.61–1.24)
GFR, Estimated: 31 mL/min — ABNORMAL LOW (ref >60)
Glucose, Bld: 85 mg/dL (ref 70–99)
Potassium: 4 mmol/L (ref 3.5–5.1)
Sodium: 140 mmol/L (ref 135–145)

## 2024-01-22 LAB — GLUCOSE, CAPILLARY
Glucose-Capillary: 32 mg/dL — CL (ref 70–99)
Glucose-Capillary: 37 mg/dL — CL (ref 70–99)
Glucose-Capillary: 154 mg/dL — ABNORMAL HIGH (ref 70–99)
Glucose-Capillary: 175 mg/dL — ABNORMAL HIGH (ref 70–99)

## 2024-01-22 SURGERY — AMPUTATION, ABOVE KNEE
Anesthesia: General | Site: Knee | Laterality: Left

## 2024-01-22 MED ORDER — ONDANSETRON HCL 4 MG/2ML IJ SOLN: 4.0000 mg | Freq: Four times a day (QID) | INTRAMUSCULAR | Status: DC | PRN

## 2024-01-22 MED ORDER — VASOPRESSIN 20 UNITS/100 ML INFUSION FOR SHOCK
0.0400 [IU]/min | INTRAVENOUS | Status: DC
  Administered 2024-01-22 – 2024-01-24 (×5): 0.04 [IU]/min via INTRAVENOUS
  Filled 2024-01-22 (×6): qty 100

## 2024-01-22 MED ORDER — FENTANYL CITRATE (PF) 250 MCG/5ML IJ SOLN
INTRAMUSCULAR | Status: AC
  Filled 2024-01-22: qty 5

## 2024-01-22 MED ORDER — SODIUM BICARBONATE 8.4 % IV SOLN
50.0000 meq | Freq: Once | INTRAVENOUS | Status: AC
  Administered 2024-01-22: 50 meq via INTRAVENOUS

## 2024-01-22 MED ORDER — MIDAZOLAM HCL 2 MG/2ML IJ SOLN
INTRAMUSCULAR | Status: AC
  Filled 2024-01-22: qty 2

## 2024-01-22 MED ORDER — PANTOPRAZOLE SODIUM 40 MG IV SOLR: 40.0000 mg | INTRAVENOUS | Status: DC

## 2024-01-22 MED ORDER — FENTANYL 2500MCG IN NS 250ML (10MCG/ML) PREMIX INFUSION
0.0000 ug/h | INTRAVENOUS | Status: DC
  Administered 2024-01-23 – 2024-01-24 (×2): 100 ug/h via INTRAVENOUS
  Filled 2024-01-22: qty 250

## 2024-01-22 MED ORDER — DOCUSATE SODIUM 50 MG/5ML PO LIQD
100.0000 mg | Freq: Two times a day (BID) | ORAL | Status: DC | PRN
Start: 2024-01-22 — End: 2024-01-27

## 2024-01-22 MED ORDER — FAMOTIDINE 20 MG PO TABS
20.0000 mg | ORAL_TABLET | Freq: Two times a day (BID) | ORAL | Status: DC
  Filled 2024-01-22: qty 1

## 2024-01-22 MED ORDER — OXYCODONE-ACETAMINOPHEN 5-325 MG PO TABS
1.0000 | ORAL_TABLET | ORAL | Status: DC | PRN
Start: 2024-01-22 — End: 2024-01-23

## 2024-01-22 MED ORDER — SODIUM BICARBONATE 650 MG PO TABS: 1300.0000 mg | ORAL_TABLET | Freq: Two times a day (BID) | ORAL | Status: DC

## 2024-01-22 MED ORDER — PROPOFOL 10 MG/ML IV BOLUS
INTRAVENOUS | Status: AC
  Filled 2024-01-22: qty 20

## 2024-01-22 MED ORDER — ALBUMIN HUMAN 5 % IV SOLN
12.5000 g | Freq: Once | INTRAVENOUS | Status: AC
  Administered 2024-01-22: 12.5 g via INTRAVENOUS

## 2024-01-22 MED ORDER — NOREPINEPHRINE 4 MG/250ML-% IV SOLN
0.0000 ug/min | INTRAVENOUS | Status: DC
Start: 2024-01-22 — End: 2024-01-23

## 2024-01-22 MED ORDER — SODIUM CHLORIDE 0.9 % IV SOLN: INTRAVENOUS | Status: DC

## 2024-01-22 MED ORDER — OXYCODONE HCL 5 MG PO TABS: 5.0000 mg | ORAL_TABLET | ORAL | Status: DC | PRN

## 2024-01-22 MED ORDER — CEFAZOLIN SODIUM-DEXTROSE 2-4 GM/100ML-% IV SOLN
2.0000 g | INTRAVENOUS | Status: AC
  Administered 2024-01-22: 2 g via INTRAVENOUS

## 2024-01-22 MED ORDER — FENTANYL 2500MCG IN NS 250ML (10MCG/ML) PREMIX INFUSION
INTRAVENOUS | Status: AC
  Administered 2024-01-22: 50 ug/h via INTRAVENOUS
  Filled 2024-01-22: qty 250

## 2024-01-22 MED ORDER — PROPOFOL 10 MG/ML IV BOLUS
INTRAVENOUS | Status: DC | PRN
  Administered 2024-01-22: 80 mg via INTRAVENOUS

## 2024-01-22 MED ORDER — ORAL CARE MOUTH RINSE: 15.0000 mL | Freq: Once | OROMUCOSAL | Status: AC

## 2024-01-22 MED ORDER — HYDROMORPHONE HCL 1 MG/ML IJ SOLN: 0.5000 mg | INTRAMUSCULAR | Status: DC | PRN

## 2024-01-22 MED ORDER — LIDOCAINE 2% (20 MG/ML) 5 ML SYRINGE
INTRAMUSCULAR | Status: DC | PRN
  Administered 2024-01-22: 60 mg via INTRAVENOUS

## 2024-01-22 MED ORDER — DEXAMETHASONE SOD PHOSPHATE PF 10 MG/ML IJ SOLN
INTRAMUSCULAR | Status: DC | PRN
  Administered 2024-01-22: 10 mg via INTRAVENOUS

## 2024-01-22 MED ORDER — LISINOPRIL 20 MG PO TABS: 40.0000 mg | ORAL_TABLET | Freq: Every day | ORAL | Status: DC

## 2024-01-22 MED ORDER — CHLORHEXIDINE GLUCONATE CLOTH 2 % EX PADS: 6.0000 | MEDICATED_PAD | Freq: Every day | CUTANEOUS | Status: DC

## 2024-01-22 MED ORDER — SODIUM CHLORIDE 0.9 % IV SOLN
1.0000 g | Freq: Once | INTRAVENOUS | Status: AC
  Administered 2024-01-23: 1 g via INTRAVENOUS
  Filled 2024-01-22: qty 10

## 2024-01-22 MED ORDER — SODIUM BICARBONATE 8.4 % IV SOLN
INTRAVENOUS | Status: DC
  Filled 2024-01-22: qty 150
  Filled 2024-01-22: qty 1000

## 2024-01-22 MED ORDER — SODIUM BICARBONATE 8.4 % IV SOLN
50.0000 meq | Freq: Once | INTRAVENOUS | Status: AC
Start: 2024-01-23 — End: 2024-01-22

## 2024-01-22 MED ORDER — ALBUMIN HUMAN 5 % IV SOLN
INTRAVENOUS | Status: AC
  Filled 2024-01-22: qty 250

## 2024-01-22 MED ORDER — FENTANYL CITRATE (PF) 100 MCG/2ML IJ SOLN
INTRAMUSCULAR | Status: AC
  Filled 2024-01-22: qty 2

## 2024-01-22 MED ORDER — ATORVASTATIN CALCIUM 80 MG PO TABS
80.0000 mg | ORAL_TABLET | Freq: Every day | ORAL | Status: DC
  Filled 2024-01-22: qty 1

## 2024-01-22 MED ORDER — KETAMINE HCL 50 MG/5ML IJ SOSY
PREFILLED_SYRINGE | INTRAMUSCULAR | Status: AC
  Filled 2024-01-22: qty 5

## 2024-01-22 MED ORDER — NOREPINEPHRINE 4 MG/250ML-% IV SOLN
0.0000 ug/min | INTRAVENOUS | Status: DC
Start: 2024-01-22 — End: 2024-01-22

## 2024-01-22 MED ORDER — MIDAZOLAM HCL (PF) 2 MG/2ML IJ SOLN
INTRAMUSCULAR | Status: DC | PRN
Start: 2024-01-22 — End: 2024-01-22
  Administered 2024-01-22: 2 mg via INTRAVENOUS

## 2024-01-22 MED ORDER — HEPARIN SODIUM (PORCINE) 5000 UNIT/ML IJ SOLN: 5000.0000 [IU] | Freq: Three times a day (TID) | INTRAMUSCULAR | Status: DC

## 2024-01-22 MED ORDER — PROPOFOL 10 MG/ML IV BOLUS
INTRAVENOUS | Status: AC
Start: 2024-01-22 — End: 2024-01-22
  Filled 2024-01-22: qty 20

## 2024-01-22 MED ORDER — ALBUMIN HUMAN 5 % IV SOLN: INTRAVENOUS | Status: DC | PRN

## 2024-01-22 MED ORDER — FENTANYL CITRATE (PF) 250 MCG/5ML IJ SOLN
INTRAMUSCULAR | Status: DC | PRN
  Administered 2024-01-22: 50 ug via INTRAVENOUS

## 2024-01-22 MED ORDER — SODIUM CHLORIDE 0.9 % IV SOLN: 1.0000 g | INTRAVENOUS | Status: DC

## 2024-01-22 MED ORDER — ONDANSETRON HCL 4 MG/2ML IJ SOLN
INTRAMUSCULAR | Status: DC | PRN
  Administered 2024-01-22: 4 mg via INTRAVENOUS

## 2024-01-22 MED ORDER — ONDANSETRON HCL 4 MG/2ML IJ SOLN
INTRAMUSCULAR | Status: AC
  Filled 2024-01-22: qty 2

## 2024-01-22 MED ORDER — ACETAMINOPHEN 325 MG PO TABS
650.0000 mg | ORAL_TABLET | Freq: Once | ORAL | Status: AC
  Administered 2024-01-22: 650 mg via ORAL

## 2024-01-22 MED ORDER — AMLODIPINE BESYLATE 10 MG PO TABS: 10.0000 mg | ORAL_TABLET | Freq: Every day | ORAL | Status: DC

## 2024-01-22 MED ORDER — LIDOCAINE 2% (20 MG/ML) 5 ML SYRINGE
INTRAMUSCULAR | Status: AC
  Filled 2024-01-22: qty 5

## 2024-01-22 MED ORDER — SODIUM CHLORIDE 0.9 % IV SOLN
250.0000 mL | INTRAVENOUS | Status: AC
  Administered 2024-01-23: 250 mL via INTRAVENOUS

## 2024-01-22 MED ORDER — DEXMEDETOMIDINE HCL IN NACL 400 MCG/100ML IV SOLN
INTRAVENOUS | Status: AC
Start: 2024-01-22 — End: 2024-01-22
  Administered 2024-01-22: 0.4 ug/kg/h via INTRAVENOUS
  Filled 2024-01-22: qty 100

## 2024-01-22 MED ORDER — KETAMINE HCL 10 MG/ML IJ SOLN
INTRAMUSCULAR | Status: DC | PRN
  Administered 2024-01-22 (×2): 10 mg via INTRAVENOUS

## 2024-01-22 MED ORDER — SODIUM CHLORIDE 0.9 % IV SOLN
500.0000 mg | INTRAVENOUS | Status: DC
  Administered 2024-01-23: 500 mg via INTRAVENOUS
  Filled 2024-01-22: qty 5

## 2024-01-22 MED ORDER — SODIUM CHLORIDE 0.9 % IV BOLUS
500.0000 mL | Freq: Once | INTRAVENOUS | Status: AC
  Administered 2024-01-22: 500 mL via INTRAVENOUS

## 2024-01-22 MED ORDER — GABAPENTIN 300 MG PO CAPS: 300.0000 mg | ORAL_CAPSULE | Freq: Three times a day (TID) | ORAL | Status: DC

## 2024-01-22 MED ORDER — CHLORHEXIDINE GLUCONATE 0.12 % MT SOLN
OROMUCOSAL | Status: AC
  Administered 2024-01-22: 15 mL via OROMUCOSAL
  Filled 2024-01-22: qty 15

## 2024-01-22 MED ORDER — PHENYLEPHRINE 80 MCG/ML (10ML) SYRINGE FOR IV PUSH (FOR BLOOD PRESSURE SUPPORT)
PREFILLED_SYRINGE | INTRAVENOUS | Status: AC
  Filled 2024-01-22: qty 10

## 2024-01-22 MED ORDER — CHLORHEXIDINE GLUCONATE CLOTH 2 % EX PADS: 6.0000 | MEDICATED_PAD | Freq: Once | CUTANEOUS | Status: DC

## 2024-01-22 MED ORDER — DOCUSATE SODIUM 100 MG PO CAPS
100.0000 mg | ORAL_CAPSULE | Freq: Every day | ORAL | Status: DC
  Filled 2024-01-22: qty 1

## 2024-01-22 MED ORDER — PHENYLEPHRINE 80 MCG/ML (10ML) SYRINGE FOR IV PUSH (FOR BLOOD PRESSURE SUPPORT)
PREFILLED_SYRINGE | INTRAVENOUS | Status: DC | PRN
  Administered 2024-01-22 (×3): 80 ug via INTRAVENOUS
  Administered 2024-01-22: 160 ug via INTRAVENOUS
  Administered 2024-01-22 (×4): 80 ug via INTRAVENOUS

## 2024-01-22 MED ORDER — PANTOPRAZOLE SODIUM 40 MG PO TBEC: 40.0000 mg | DELAYED_RELEASE_TABLET | Freq: Every day | ORAL | Status: DC

## 2024-01-22 MED ORDER — CHLORHEXIDINE GLUCONATE 0.12 % MT SOLN: 15.0000 mL | Freq: Once | OROMUCOSAL | Status: AC

## 2024-01-22 MED ORDER — SODIUM BICARBONATE 8.4 % IV SOLN
INTRAVENOUS | Status: AC
Start: 2024-01-22 — End: 2024-01-22
  Administered 2024-01-22: 50 meq via INTRAVENOUS
  Filled 2024-01-22: qty 100

## 2024-01-22 MED ORDER — ACETAMINOPHEN 500 MG PO TABS
1000.0000 mg | ORAL_TABLET | Freq: Three times a day (TID) | ORAL | Status: DC
  Filled 2024-01-22: qty 2

## 2024-01-22 MED ORDER — POLYETHYLENE GLYCOL 3350 17 G PO PACK
17.0000 g | PACK | Freq: Every day | ORAL | Status: DC
  Filled 2024-01-22: qty 1

## 2024-01-22 MED ORDER — DEXTROSE 5 % IV SOLN: INTRAVENOUS | Status: DC

## 2024-01-22 MED ORDER — METHOCARBAMOL 500 MG PO TABS: 500.0000 mg | ORAL_TABLET | Freq: Three times a day (TID) | ORAL | Status: DC

## 2024-01-22 MED ORDER — POLYETHYLENE GLYCOL 3350 17 G PO PACK: 17.0000 g | PACK | Freq: Every day | ORAL | Status: DC | PRN

## 2024-01-22 MED ORDER — ASPIRIN 81 MG PO TBEC: 81.0000 mg | DELAYED_RELEASE_TABLET | Freq: Every day | ORAL | Status: DC

## 2024-01-22 MED ORDER — DEXTROSE-SODIUM CHLORIDE 5-0.9 % IV SOLN: INTRAVENOUS | Status: DC

## 2024-01-22 MED ORDER — CEFAZOLIN SODIUM-DEXTROSE 2-4 GM/100ML-% IV SOLN
INTRAVENOUS | Status: AC
  Filled 2024-01-22: qty 100

## 2024-01-22 MED ORDER — ACETAMINOPHEN 325 MG PO TABS
ORAL_TABLET | ORAL | Status: AC
Start: 2024-01-22 — End: 2024-01-22
  Filled 2024-01-22: qty 2

## 2024-01-22 MED ORDER — DEXMEDETOMIDINE HCL IN NACL 400 MCG/100ML IV SOLN
0.0000 ug/kg/h | INTRAVENOUS | Status: DC
  Administered 2024-01-23: 0.6 ug/kg/h via INTRAVENOUS
  Administered 2024-01-23: 0.8 ug/kg/h via INTRAVENOUS
  Administered 2024-01-24 (×3): 1.2 ug/kg/h via INTRAVENOUS
  Administered 2024-01-25: 1.1 ug/kg/h via INTRAVENOUS
  Administered 2024-01-25: 0.4 ug/kg/h via INTRAVENOUS
  Administered 2024-01-25: 1 ug/kg/h via INTRAVENOUS
  Administered 2024-01-26: 0.6 ug/kg/h via INTRAVENOUS
  Filled 2024-01-22 (×9): qty 100

## 2024-01-22 MED ORDER — PHENOL 1.4 % MT LIQD: 1.0000 | OROMUCOSAL | Status: DC | PRN

## 2024-01-22 MED ORDER — SUGAMMADEX SODIUM 200 MG/2ML IV SOLN
INTRAVENOUS | Status: DC | PRN
  Administered 2024-01-22: 200 mg via INTRAVENOUS

## 2024-01-22 MED ORDER — NOREPINEPHRINE 4 MG/250ML-% IV SOLN
INTRAVENOUS | Status: AC
  Administered 2024-01-22: 5 ug/min via INTRAVENOUS
  Filled 2024-01-22: qty 250

## 2024-01-22 MED ORDER — AMIODARONE IV BOLUS ONLY 150 MG/100ML: 150.0000 mg | Freq: Once | INTRAVENOUS | Status: AC

## 2024-01-22 MED ORDER — 0.9 % SODIUM CHLORIDE (POUR BTL) OPTIME
TOPICAL | Status: DC | PRN
  Administered 2024-01-22: 1000 mL

## 2024-01-22 MED ORDER — CEFAZOLIN SODIUM-DEXTROSE 2-4 GM/100ML-% IV SOLN
2.0000 g | Freq: Three times a day (TID) | INTRAVENOUS | Status: AC
  Administered 2024-01-22 – 2024-01-23 (×2): 2 g via INTRAVENOUS
  Filled 2024-01-22 (×3): qty 100

## 2024-01-22 MED ORDER — AMIODARONE IV BOLUS ONLY 150 MG/100ML
INTRAVENOUS | Status: AC
  Administered 2024-01-22: 150 mg via INTRAVENOUS
  Filled 2024-01-22: qty 100

## 2024-01-22 MED ORDER — ROCURONIUM BROMIDE 10 MG/ML (PF) SYRINGE
PREFILLED_SYRINGE | INTRAVENOUS | Status: DC | PRN
  Administered 2024-01-22: 50 mg via INTRAVENOUS
  Administered 2024-01-22: 10 mg via INTRAVENOUS

## 2024-01-22 MED ORDER — ROCURONIUM BROMIDE 10 MG/ML (PF) SYRINGE
PREFILLED_SYRINGE | INTRAVENOUS | Status: AC
  Filled 2024-01-22: qty 10

## 2024-01-22 SURGICAL SUPPLY — 52 items
BAG COUNTER SPONGE SURGICOUNT (BAG) ×2 IMPLANT
BLADE SAGITTAL 25.0X1.19X90 (BLADE) ×2 IMPLANT
BLADE SAW THK.89X75X18XSGTL (BLADE) IMPLANT
BLADE SURG 21 STRL SS (BLADE) ×2 IMPLANT
BNDG COHESIVE 4X5 WHT NS (GAUZE/BANDAGES/DRESSINGS) IMPLANT
BNDG COHESIVE 6X5 TAN ST LF (GAUZE/BANDAGES/DRESSINGS) ×2 IMPLANT
BNDG COMPR ESMARK 6X3 LF (GAUZE/BANDAGES/DRESSINGS) IMPLANT
BNDG ELASTIC 4X5.8 VLCR STR LF (GAUZE/BANDAGES/DRESSINGS) ×2 IMPLANT
BNDG ELASTIC 6INX 5YD STR LF (GAUZE/BANDAGES/DRESSINGS) ×2 IMPLANT
BNDG GAUZE DERMACEA FLUFF 4 (GAUZE/BANDAGES/DRESSINGS) ×2 IMPLANT
BUR DISC 0.8X25 (BURR) IMPLANT
CANISTER SUCTION 3000ML PPV (SUCTIONS) ×2 IMPLANT
CANISTER WOUND CARE 500ML ATS (WOUND CARE) ×2 IMPLANT
CHLORAPREP W/TINT 26 (MISCELLANEOUS) ×2 IMPLANT
COVER SURGICAL LIGHT HANDLE (MISCELLANEOUS) ×2 IMPLANT
CUFF TRNQT CYL 24X4X16.5-23 (TOURNIQUET CUFF) IMPLANT
CUFF TRNQT CYL 34X4.125X (TOURNIQUET CUFF) IMPLANT
DRAIN CHANNEL 19F RND (DRAIN) IMPLANT
DRAPE DERMATAC (DRAPES) IMPLANT
DRAPE HALF SHEET 40X57 (DRAPES) ×2 IMPLANT
DRAPE INCISE IOBAN 66X45 STRL (DRAPES) IMPLANT
DRAPE SURG ORHT 6 SPLT 77X108 (DRAPES) ×4 IMPLANT
DRESSING PREVENA PLUS CUSTOM (GAUZE/BANDAGES/DRESSINGS) IMPLANT
ELECT CAUTERY BLADE 6.4 (BLADE) ×2 IMPLANT
ELECTRODE REM PT RTRN 9FT ADLT (ELECTROSURGICAL) ×2 IMPLANT
EVACUATOR SILICONE 100CC (DRAIN) IMPLANT
GAUZE SPONGE 4X4 12PLY STRL (GAUZE/BANDAGES/DRESSINGS) ×2 IMPLANT
GAUZE XEROFORM 5X9 LF (GAUZE/BANDAGES/DRESSINGS) IMPLANT
GLOVE BIO SURGEON STRL SZ8 (GLOVE) ×2 IMPLANT
GOWN STRL REUS W/ TWL LRG LVL3 (GOWN DISPOSABLE) ×4 IMPLANT
GOWN STRL REUS W/ TWL XL LVL3 (GOWN DISPOSABLE) ×2 IMPLANT
KIT BASIN OR (CUSTOM PROCEDURE TRAY) ×2 IMPLANT
KIT PREVENA INCISION MGT20CM45 (CANNISTER) IMPLANT
KIT TURNOVER KIT B (KITS) ×2 IMPLANT
PACK GENERAL/GYN (CUSTOM PROCEDURE TRAY) ×2 IMPLANT
PAD ARMBOARD POSITIONER FOAM (MISCELLANEOUS) ×4 IMPLANT
PENCIL SMOKE EVACUATOR (MISCELLANEOUS) ×2 IMPLANT
PREVENA RESTOR ARTHOFORM 46X30 (CANNISTER) IMPLANT
SOLN 0.9% NACL POUR BTL 1000ML (IV SOLUTION) ×2 IMPLANT
SOLN STERILE WATER BTL 1000 ML (IV SOLUTION) ×2 IMPLANT
STAPLER SKIN 35 REG (STAPLE) ×2 IMPLANT
STAPLER SKIN PROX 35W (STAPLE) ×2 IMPLANT
STOCKINETTE IMPERVIOUS LG (DRAPES) ×2 IMPLANT
SUT ETHILON 2 0 PSLX (SUTURE) ×4 IMPLANT
SUT ETHILON 3 0 PS 1 (SUTURE) IMPLANT
SUT SILK 0 TIES 10X30 (SUTURE) ×2 IMPLANT
SUT SILK 2 0 SH CR/8 (SUTURE) ×2 IMPLANT
SUT SILK 2-0 18XBRD TIE 12 (SUTURE) IMPLANT
SUT VIC AB 2-0 CT1 18 (SUTURE) ×6 IMPLANT
TAPE UMBILICAL 1/8X30 (MISCELLANEOUS) IMPLANT
TOWEL GREEN STERILE (TOWEL DISPOSABLE) ×4 IMPLANT
UNDERPAD 30X36 HEAVY ABSORB (UNDERPADS AND DIAPERS) ×2 IMPLANT

## 2024-01-22 NOTE — Procedures (Signed)
 Central Venous Catheter Insertion Procedure Note  William Perkins  [id number]  02-10-1951  Date:01/22/24  Time:11:30 PM   Provider Performing:Orian Amberg   Procedure: Insertion of Non-tunneled Central Venous [phone]) with US  guidance (23062)   Indication(s) Medication administration  Consent Risks of the procedure as well as the alternatives and risks of each were explained to the patient and/or caregiver.  Consent for the procedure was obtained and is signed in the bedside chart  Anesthesia Topical only with 1% lidocaine    Timeout Verified patient identification, verified procedure, site/side was marked, verified correct patient position, special equipment/implants available, medications/allergies/relevant history reviewed, required imaging and test results available.  Sterile Technique Maximal sterile technique including full sterile barrier drape, hand hygiene, sterile gown, sterile gloves, mask, hair covering, sterile ultrasound probe cover (if used).  Procedure Description Area of catheter insertion was cleaned with chlorhexidine  and draped in sterile fashion.  With real-time ultrasound guidance a central venous catheter was placed into the right internal jugular vein. Nonpulsatile blood flow and easy flushing noted in all ports.  The catheter was sutured in place and sterile dressing applied.  Complications/Tolerance None; patient tolerated the procedure well. Chest X-ray is ordered to verify placement for internal jugular or subclavian cannulation.   Chest x-ray is not ordered for femoral cannulation.  EBL Minimal  Specimen(s) None   POCUS IMAGE OF RIGHT internal jugular GUIDEWIRE   Tamela Stakes, MD  Attending Physician, Critical Care Medicine [place] [facility] See Amion for pager If no response to pager, please call [phone] until 7pm After 7pm, Please call E-link [phone]

## 2024-01-22 NOTE — H&P (Incomplete)
 NAME:  William Perkins, MRN:  [id number], DOB:  1951-03-05, LOS: 1 ADMISSION DATE:  01/22/2024,  CHIEF COMPLAINT:  Cardiac Arrest.   History of Present Illness:  This 73 year old gentleman has a history of peripheral vascular disease and ischemic left lower extremity.  He had above-knee amputation done last night.  Postoperatively patient was apparently very drowsy and went bradycardic.  He went into PEA cardiac arrest.  CPR was performed immediately per ACS protocol for 18 minutes.  ROSC was obtained.  Apparently patient threw up some blood before intubation.  Upon arrival to ICU patient was obtunded.  He was hemodynamically stable but slowly getting hypotensive.  EKG showed first-degree AV block sinus rhythm with left bundle branch block.  Chest x-ray shows bilateral infiltrates more on the right side likely aspiration.  Patient is started on Levophed  and eventually vasopressin .  Art line and central line are placed in the ICU.  A bronchoscopy showed diffuse blood with no active bleeding.  This is likely hematemesis that he aspirated.  ABG showed significant metabolic acidosis patient is started on bicarb drip.  Hemoglobin was 6.2 patient is being given 2 units of blood transfusion.  Bedside POCUS showed no pericardial effusion no right heart strain and EF seems low.  Echo will be done in the morning.  Family in the room I spoke with them and updated them.  Pertinent  Medical History   Past Medical History:  Diagnosis Date   Cancer (HCC) 02/2023   laryngeal cancer   Chronic renal insufficiency 03/21/2006   CrCl <50 ml   DDD (degenerative disc disease), cervical    GERD 03/22/2006   Qualifier: Diagnosis of  By: [name]     GERD (gastroesophageal reflux disease) 03/22/2006   Hand numbness 10/07/2009   pt states not any longer   Hyperlipidemia 03/22/2008   Hypertension    Hypertension 03/22/2006   Left ventricular hypertrophy 03/22/2006   Leg pain, left 11/15/2009   Lumbar  strain 07/15/2008   Renal insufficiency    Soft tissue disorder 08/2008   Tobacco abuse 03/22/2006   2 ppd x 30 years     Significant Hospital Events: Including procedures, antibiotic start and stop dates in addition to other pertinent events   Left AKA done on 01/22/2024 Cardiac arrest PEA on 01/22/2024 postoperatively  Interim History / Subjective:  Patient is brought to ICU after ROSC was obtained after cardiac arrest.  Objective    Blood pressure (!) 66/56, pulse 79, temperature (!) 90.5 F (32.5 C), resp. rate 17, height 5' 11 (1.803 m), weight 56.1 kg, SpO2 100%.    Vent Mode: PRVC FiO2 (%):  [100 %] 100 % Set Rate:  [18 bmp] 18 bmp Vt Set:  [600 mL] 600 mL PEEP:  [5 cmH20] 5 cmH20 Plateau Pressure:  [23 cmH20] 23 cmH20   Intake/Output Summary (Last 24 hours) at 01/23/2024 0116 Last data filed at 01/23/2024 0100 Gross per 24 hour  Intake 1342.6 ml  Output 425 ml  Net 917.6 ml   Filed Weights   01/22/24 0914 01/22/24 2200  Weight: 58.1 kg 56.1 kg    Examination:   Physical exam: General: Crtitically ill-appearing male, orally intubated HEENT: [place]/AT, eyes anicteric.  ETT and cortrak in place Neuro: Sedated, not following commands.  Eyes are closed.  Pupils 3 mm bilateral reactive to light Chest: Coarse breath sounds, no wheezes or rhonchi Heart: Regular rate and rhythm, no murmurs or gallops Abdomen: Soft, nondistended, bowel sounds present  Resolved problem list   Assessment and Plan   In hospital Cardiac Arrest - PEA S/P CPR and ROSC. Elevated troponin - likely secondary to cardiac arrest  This is likely secondary to hypoglycemia  - 18 minutes of CPR - Maintain Normothermia - Telemetry - Echocardiogram in the morning - ScVO2 - 97.9 - CT head pending. - Elevated troponin is likely secondary to cardiac arrest.  Even if it is ACS unfortunately given his hemoglobin and hematemesis we could not do anticoagulation. - D/W Cardiology on call - no  interventions recommended given the Dropped hemoglobin. Based on formal echo - we can consider formal cardiology consult in the am.  Acute Hypoxic Respiratory Failure Aspiration Pneumonitis - Status post bronchoscopy showed aspirated blood - Intubated during code - Ventilator bundle in place - Lung protective ventilation AMP plats less than 30 driving pressure 15 - Sedated with Precedex and fentanyl  - CT chest angio is pending. - Empiric azithromycin and Rocephin.  Shock - likely hemorrhagic - Patient is currently on vasopressin and Levophed. - Blood transfusion is being given.  Upper GI bleed Acute Blood Loss Anemia Elevated LFTs - Shock Liver Coagulopathy-low fibrinogen INR 4.6.  - 2 units blood transfusion - Consult gastroenterology - CT abdomen pelvis is pending. - Fibrinogen and FFP were ordered.  Severe Metabolic Acidosis Lactic Acidosis Acute Kidney Injury History of chronic kidney disease stage III.  - Continue bicarb drip - Consult nephrology - Baseline creatinine 2.3. - Order urine output and renal function electrolytes.  Hypoglycemia: Resolved - Patient has been on dextrose  normal saline drip.  This is changed to bicarb drip as he is found to be acidotic. - Sugars are high right now.  Will start SSI.  H/O PVD s/p Stents in his Femoral Arteries.  - s/p Left AKA  Laryngeal Cancer s/p radiation 2024  Family in the room spoke with them.   Labs   CBC: Recent Labs  Lab 01/17/24 1003 01/22/24 0947 01/22/24 1614 01/22/24 2336 01/23/24 0003  WBC  --  8.0 8.8  --  12.4*  HGB 10.2* 8.3* 7.4* 6.5* 6.2*  HCT 30.0* 26.2* 23.1* 19.0* 20.9*  MCV  --  94.9 95.1  --  103.0*  PLT  --  345 286  --  167    Basic Metabolic Panel: Recent Labs  Lab 01/17/24 1003 01/22/24 0947 01/22/24 1614 01/22/24 2336  NA 137 140 140 135  K 4.2 4.1 4.0 4.9  CL 102 102 103  --   CO2  --  23 19*  --   GLUCOSE 133* 96 85  --   BUN 37* 23 25*  --   CREATININE 2.20* 2.30*  2.22*  --   CALCIUM   --  9.1 8.4*  --    GFR: Estimated Creatinine Clearance: 23.5 mL/min (A) (by C-G formula based on SCr of 2.22 mg/dL (H)). Recent Labs  Lab 01/22/24 0947 01/22/24 1614 01/23/24 0003  WBC 8.0 8.8 12.4*  LATICACIDVEN  --   --  >9.0*    Liver Function Tests: Recent Labs  Lab 01/22/24 0947  AST 26  ALT 16  ALKPHOS 100  BILITOT 0.8  PROT 7.3  ALBUMIN 3.2*   No results for input(s): LIPASE, AMYLASE in the last 168 hours. No results for input(s): AMMONIA in the last 168 hours.  ABG    Component Value Date/Time   HCO3 5.3 (L) 01/22/2024 2336   TCO2 6 (L) 01/22/2024 2336   ACIDBASEDEF 25.0 (H) 01/22/2024 2336   O2SAT  97.9 01/23/2024 0015     Coagulation Profile: Recent Labs  Lab 01/22/24 0947  INR 1.2    Cardiac Enzymes: No results for input(s): CKTOTAL, CKMB, CKMBINDEX, TROPONINI in the last 168 hours.  HbA1C: Hemoglobin A1C  Date/Time Value Ref Range Status  09/16/2023 01:14 PM 6.9 (A) 4.0 - 5.6 % Final  10/18/2022 08:53 AM 6.2 (A) 4.0 - 5.6 % Final   Hgb A1c MFr Bld  Date/Time Value Ref Range Status  08/17/2021 04:31 PM 6.3 (H) 4.8 - 5.6 % Final    Comment:             Prediabetes: 5.7 - 6.4          Diabetes: >6.4          Glycemic control for adults with diabetes: <7.0     CBG: Recent Labs  Lab 01/17/24 1353 01/22/24 2111 01/22/24 2128 01/22/24 2149 01/22/24 2314  GLUCAP 106* 32* 37* 154* 175*    Review of Systems:   Review of systems cannot be done as the patient is sedated and intubated.  Past Medical History:  He,  has a past medical history of Cancer (HCC) (02/2023), Chronic renal insufficiency (03/21/2006), DDD (degenerative disc disease), cervical, GERD (03/22/2006), GERD (gastroesophageal reflux disease) (03/22/2006), Hand numbness (10/07/2009), Hyperlipidemia (03/22/2008), Hypertension, Hypertension (03/22/2006), Left ventricular hypertrophy (03/22/2006), Leg pain, left (11/15/2009), Lumbar strain  (07/15/2008), Renal insufficiency, Soft tissue disorder (08/2008), and Tobacco abuse (03/22/2006).   Surgical History:   Past Surgical History:  Procedure Laterality Date   ABDOMINAL AORTOGRAM N/A 12/06/2023   Procedure: ABDOMINAL AORTOGRAM;  Surgeon: Magda Debby SAILOR, MD;  Location: [facility];  Service: Cardiovascular;  Laterality: N/A;   ABDOMINAL AORTOGRAM W/LOWER EXTREMITY N/A 05/18/2020   Procedure: ABDOMINAL AORTOGRAM W/LOWER EXTREMITY;  Surgeon: Magda Debby SAILOR, MD;  Location: [facility];  Service: Cardiovascular;  Laterality: N/A;   ABDOMINAL AORTOGRAM W/LOWER EXTREMITY N/A 01/17/2024   Procedure: ABDOMINAL AORTOGRAM W/LOWER EXTREMITY;  Surgeon: Magda Debby SAILOR, MD;  Location: [facility];  Service: Cardiovascular;  Laterality: N/A;   BACK SURGERY  2020   PLIF   COLONOSCOPY     DIRECT LARYNGOSCOPY N/A 01/24/2023   Procedure: DIRECT LARYNGOSCOPY;  Surgeon: Tobie Eldora NOVAK, MD;  Location: [facility];  Service: ENT;  Laterality: N/A;  DIRECT LARYNGOSCOPY WITH BIOPSY OF VOCAL CORD LESION   ENDARTERECTOMY FEMORAL Right 05/20/2020   Procedure: RIGHT EXTENDED FEMORAL ENDARTERECTOMY AND PROFUNDAPLASTY;  Surgeon: Magda Debby SAILOR, MD;  Location: [facility] OR;  Service: Vascular;  Laterality: Right;   ENDARTERECTOMY FEMORAL Left 12/11/2023   Procedure: ENDARTERECTOMY, FEMORAL;  Surgeon: Magda Debby SAILOR, MD;  Location: [facility] OR;  Service: Vascular;  Laterality: Left;   LOWER EXTREMITY ANGIOGRAPHY N/A 12/06/2023   Procedure: Lower Extremity Angiography;  Surgeon: Magda Debby SAILOR, MD;  Location: [facility];  Service: Cardiovascular;  Laterality: N/A;   LOWER EXTREMITY ANGIOGRAPHY N/A 01/17/2024   Procedure: Lower Extremity Angiography;  Surgeon: Magda Debby SAILOR, MD;  Location: [facility] INVASIVE CV LAB;  Service: Cardiovascular;  Laterality: N/A;   WISDOM TOOTH EXTRACTION       Social History:   reports that he quit smoking about 10 years ago. His smoking use included cigarettes.  He started smoking about 54 years ago. He has a 44 pack-year smoking history. He has never used smokeless tobacco. He reports that he does not drink alcohol and does not use drugs.   Family History:  His family history includes Diabetes in his  brother, mother, and sister; Heart disease in his brother. There is no history of Rectal cancer, Stomach cancer, or Colon cancer.   Allergies No Known Allergies   Home Medications  Prior to Admission medications   Medication Sig Start Date End Date Taking? Authorizing Provider  acetaminophen  (TYLENOL ) 500 MG tablet Take 1,000 mg by mouth every 6 (six) hours as needed for mild pain or moderate pain.   Yes [provider]  amLODipine  (NORVASC ) 10 MG tablet TAKE 1 TABLET(10 MG) BY MOUTH DAILY 12/16/23  Yes Rosan Dayton BROCKS, DO  aspirin  EC 81 MG tablet Take 81 mg by mouth daily. Swallow whole.   Yes [provider]  atorvastatin  (LIPITOR) 80 MG tablet Take 1 tablet (80 mg total) by mouth daily. 10/07/23  Yes Rosan Dayton BROCKS, DO  cloNIDine  (CATAPRES ) 0.1 MG tablet Take 1 tablet (0.1 mg total) by mouth 2 (two) times daily. 08/17/21 01/22/24 Yes Fernand Prost, MD  HYDROcodone -acetaminophen  (NORCO/VICODIN) 5-325 MG tablet Take 1 tablet by mouth every 6 (six) hours as needed for moderate pain (pain score 4-6). 01/14/24  Yes Magda Debby SAILOR, MD  lisinopril  (ZESTRIL ) 40 MG tablet Take 1 tablet (40 mg total) by mouth daily. 12/16/23  Yes Rosan Dayton BROCKS, DO  polyethylene glycol (MIRALAX  / GLYCOLAX ) 17 g packet Take 17 g by mouth 2 (two) times daily. Patient taking differently: Take 17 g by mouth daily as needed for mild constipation or moderate constipation. 10/16/18  Yes Barbaraann Katz, MD  sodium bicarbonate  650 MG tablet Take 2 tablets (1,300 mg total) by mouth 2 (two) times daily. 01/09/24 01/08/25 Yes Rosan Dayton BROCKS, DO  lidocaine  (XYLOCAINE ) 2 % solution Patient: Mix 1part 2% viscous lidocaine , 1part H20. Swallow 10mL of diluted mixture, before  meals and at bedtime, up to QID Patient not taking: Reported on 01/20/2024 03/12/23   Izell Domino, MD     Critical care time: 17     Tamela Stakes, MD  Attending Physician, Critical Care Medicine [place] [facility] See Amion for pager If no response to pager, please call [phone] until 7pm After 7pm, Please call E-link [phone]

## 2024-01-22 NOTE — Procedures (Signed)
 Arterial Catheter Insertion Procedure Note  DAMETRI OZBURN  [id number]  1950/06/21  Date:01/22/24  Time:11:41 PM    Provider Performing: Rexene LOISE Blush    Procedure: Insertion of Arterial Line (63379) with US  guidance (23062)   Indication(s) Blood pressure monitoring and/or need for frequent ABGs  Consent Risks of the procedure as well as the alternatives and risks of each were explained to the patient and/or caregiver.  Consent for the procedure was obtained and is signed in the bedside chart  Anesthesia 5 mL 1% lidocaine , sedated on dex and fentanyl    Time Out Verified patient identification, verified procedure, site/side was marked, verified correct patient position, special equipment/implants available, medications/allergies/relevant history reviewed, required imaging and test results available.   Sterile Technique Maximal sterile technique including full sterile barrier drape, hand hygiene, sterile gown, sterile gloves, mask, hair covering, sterile ultrasound probe cover (if used).   Procedure Description Area of catheter insertion was cleaned with chlorhexidine  and draped in sterile fashion. With real-time ultrasound guidance an arterial catheter was placed into the left axillary artery.  Appropriate arterial tracings confirmed on monitor.     Complications/Tolerance None; patient tolerated the procedure well.   EBL Minimal   Specimen(s) None   Rexene LOISE Blush, [place] [facility] 01/22/24 11:42 PM  Please see [email or web address] for pager details.  From 7A-7P if no response, please call [phone] After hours, please call ELink [phone]

## 2024-01-22 NOTE — H&P (Incomplete)
 History and Physical    Patient: William Perkins FMW:[id number] DOB: 1950-05-10 DOA: 01/22/2024 DOS: the patient was seen and examined on 01/22/2024 PCP: William Dayton JAYSON, DO  Patient coming from: Home  Chief Complaint: No chief complaint on file.  HPI: William Perkins is a 73 y.o. male with medical history significant of laryngeal cancer, PAD s/p recent left femoral endarterectomy with bovine patch angioplasty due to critical limb ischemia of the left lower extremity with rest pain by Dr. Magda on 12/11/2023, HTN, and GERD who is POD0 for L AKA performed on 11/19 by Dr. Magda.  In the PACU, pt resting comfortably and AFVSS. Labs notable for Cr 2.3 which is baseline. Lucie Apt AP requested medicine admission s/p L AKA w/ vascular surgery to follow.  UPDATE: Post-operatively pt experienced cardiac arrest, and CPR performed with ROSC after 18 mins per CCM note and pt transported to ICU.   Review of Systems: As mentioned in the history of present illness. All other systems reviewed and are negative. Past Medical History:  Diagnosis Date   Cancer (HCC) 02/2023   laryngeal cancer   Chronic renal insufficiency 03/21/2006   CrCl <50 ml   DDD (degenerative disc disease), cervical    GERD 03/22/2006   Qualifier: Diagnosis of  By: William Perkins     GERD (gastroesophageal reflux disease) 03/22/2006   Hand numbness 10/07/2009   pt states not any longer   Hyperlipidemia 03/22/2008   Hypertension    Hypertension 03/22/2006   Left ventricular hypertrophy 03/22/2006   Leg pain, left 11/15/2009   Lumbar strain 07/15/2008   Renal insufficiency    Soft tissue disorder 08/2008   Tobacco abuse 03/22/2006   2 ppd x 30 years   Past Surgical History:  Procedure Laterality Date   ABDOMINAL AORTOGRAM N/A 12/06/2023   Procedure: ABDOMINAL AORTOGRAM;  Surgeon: Magda Debby SAILOR, MD;  Location: [facility] INVASIVE [facility];  Service: Cardiovascular;  Laterality: N/A;   ABDOMINAL AORTOGRAM W/LOWER  EXTREMITY N/A 05/18/2020   Procedure: ABDOMINAL AORTOGRAM W/LOWER EXTREMITY;  Surgeon: Magda Debby SAILOR, MD;  Location: [facility];  Service: Cardiovascular;  Laterality: N/A;   ABDOMINAL AORTOGRAM W/LOWER EXTREMITY N/A 01/17/2024   Procedure: ABDOMINAL AORTOGRAM W/LOWER EXTREMITY;  Surgeon: Magda Debby SAILOR, MD;  Location: [facility];  Service: Cardiovascular;  Laterality: N/A;   BACK SURGERY  2020   PLIF   COLONOSCOPY     DIRECT LARYNGOSCOPY N/A 01/24/2023   Procedure: DIRECT LARYNGOSCOPY;  Surgeon: William Eldora NOVAK, MD;  Location: [facility];  Service: ENT;  Laterality: N/A;  DIRECT LARYNGOSCOPY WITH BIOPSY OF VOCAL CORD LESION   ENDARTERECTOMY FEMORAL Right 05/20/2020   Procedure: RIGHT EXTENDED FEMORAL ENDARTERECTOMY AND PROFUNDAPLASTY;  Surgeon: Magda Debby SAILOR, MD;  Location: [facility] OR;  Service: Vascular;  Laterality: Right;   ENDARTERECTOMY FEMORAL Left 12/11/2023   Procedure: ENDARTERECTOMY, FEMORAL;  Surgeon: Magda Debby SAILOR, MD;  Location: [facility] OR;  Service: Vascular;  Laterality: Left;   LOWER EXTREMITY ANGIOGRAPHY N/A 12/06/2023   Procedure: Lower Extremity Angiography;  Surgeon: Magda Debby SAILOR, MD;  Location: [facility];  Service: Cardiovascular;  Laterality: N/A;   LOWER EXTREMITY ANGIOGRAPHY N/A 01/17/2024   Procedure: Lower Extremity Angiography;  Surgeon: Magda Debby SAILOR, MD;  Location: [facility] INVASIVE CV LAB;  Service: Cardiovascular;  Laterality: N/A;   WISDOM TOOTH EXTRACTION     Social History:  reports that he quit smoking about 10 years ago. His smoking use included cigarettes. He started  smoking about 54 years ago. He has a 44 pack-year smoking history. He has never used smokeless tobacco. He reports that he does not drink alcohol and does not use drugs.  No Known Allergies  Family History  Problem Relation Age of Onset   Diabetes Mother    Diabetes Sister    Diabetes Brother    Heart disease Brother    Rectal cancer Neg Hx    Stomach cancer Neg Hx     Colon cancer Neg Hx     Prior to Admission medications   Medication Sig Start Date End Date Taking? Authorizing Provider  acetaminophen  (TYLENOL ) 500 MG tablet Take 1,000 mg by mouth every 6 (six) hours as needed for mild pain or moderate pain.   Yes [provider]  amLODipine  (NORVASC ) 10 MG tablet TAKE 1 TABLET(10 MG) BY MOUTH DAILY 12/16/23  Yes William Dayton BROCKS, DO  aspirin  EC 81 MG tablet Take 81 mg by mouth daily. Swallow whole.   Yes [provider]  atorvastatin  (LIPITOR) 80 MG tablet Take 1 tablet (80 mg total) by mouth daily. 10/07/23  Yes William Dayton BROCKS, DO  cloNIDine  (CATAPRES ) 0.1 MG tablet Take 1 tablet (0.1 mg total) by mouth 2 (two) times daily. 08/17/21 01/22/24 Yes Fernand Prost, MD  HYDROcodone -acetaminophen  (NORCO/VICODIN) 5-325 MG tablet Take 1 tablet by mouth every 6 (six) hours as needed for moderate pain (pain score 4-6). 01/14/24  Yes Magda Debby SAILOR, MD  lisinopril  (ZESTRIL ) 40 MG tablet Take 1 tablet (40 mg total) by mouth daily. 12/16/23  Yes William Dayton BROCKS, DO  polyethylene glycol (MIRALAX  / GLYCOLAX ) 17 g packet Take 17 g by mouth 2 (two) times daily. Patient taking differently: Take 17 g by mouth daily as needed for mild constipation or moderate constipation. 10/16/18  Yes Barbaraann Katz, MD  sodium bicarbonate  650 MG tablet Take 2 tablets (1,300 mg total) by mouth 2 (two) times daily. 01/09/24 01/08/25 Yes William Dayton BROCKS, DO  lidocaine  (XYLOCAINE ) 2 % solution Patient: Mix 1part 2% viscous lidocaine , 1part H20. Swallow 10mL of diluted mixture, before meals and at bedtime, up to QID Patient not taking: Reported on 01/20/2024 03/12/23   William Domino, MD    Physical Exam: Vitals:   01/22/24 0914 01/22/24 1328 01/22/24 1330  BP: 115/74 (!) 81/60 92/66  Pulse: 69 83 80  Resp: 20 20 (!) 21  Temp: 98 F (36.7 C) 98.2 F (36.8 C)   TempSrc: Oral    SpO2: 98% 98% 100%  Weight: 58.1 kg    Height: 5' 11 (1.803 m)     General: Alert,  oriented x3, resting comfortably in no acute distress Respiratory: Lungs clear to auscultation bilaterally with normal respiratory effort; no w/r/r Cardiovascular: Regular rate and rhythm w/o m/r/g Abdomen: Soft, nontender, nondistended. Positive bowel sounds MSK: L AKA stump wrapped with dressing c/d/i   Data Reviewed:  Lab Results  Component Value Date   WBC 8.0 01/22/2024   HGB 8.3 (L) 01/22/2024   HCT 26.2 (L) 01/22/2024   MCV 94.9 01/22/2024   PLT 345 01/22/2024   Lab Results  Component Value Date   GLUCOSE 96 01/22/2024   CALCIUM  9.1 01/22/2024   NA 140 01/22/2024   K 4.1 01/22/2024   CO2 23 01/22/2024   CL 102 01/22/2024   BUN 23 01/22/2024   CREATININE 2.30 (H) 01/22/2024   Lab Results  Component Value Date   ALT 16 01/22/2024   AST 26 01/22/2024   ALKPHOS 100  01/22/2024   BILITOT 0.8 01/22/2024   Lab Results  Component Value Date   INR 1.2 01/22/2024   INR 1.2 12/11/2023   INR 1.2 05/20/2020   Radiology: No results found.   Assessment and Plan: 55M h/o laryngeal cancer, PAD s/p recent left femoral endarterectomy with bovine patch angioplasty due to critical limb ischemia of the left lower extremity with rest pain by Dr. Magda on 12/11/2023, HTN, and GERD who is POD0 for L AKA performed on 11/19 by Dr. Magda.  Cardiac arrest -CCM consulted; apprec eval/recs  L AKA -PT/OT consulted; apprec eval/recs -Vascular surgery following; apprec eval/recs -PTA ASA 81mg  daily to resume tomorrow if OK with vascular surgery; will defer to them -PTA atorvastatin  80mg  daily -Multimodal pain control with tylenol  TID, gabapentin  TID, robaxin TID, and oxycodone  5mg  q4h prn  HTN -Amlodipine  10mg  daily and lisinopril  40mg  daily for now -HOLD pta clonidine  0.1mg  BID given c/f rebound HTN; consider long acting agent such as Coreg vs clonidine  patch for superior BP control   Advance Care Planning:   Code Status: Full Code   Consults: Vascular surgery  Family  Communication: Wife  Severity of Illness: The appropriate patient status for this patient is INPATIENT. Inpatient status is judged to be reasonable and necessary in order to provide the required intensity of service to ensure the patient's safety. The patient's presenting symptoms, physical exam findings, and initial radiographic and laboratory data in the context of their chronic comorbidities is felt to place them at high risk for further clinical deterioration. Furthermore, it is not anticipated that the patient will be medically stable for discharge from the hospital within 2 midnights of admission.   * I certify that at the point of admission it is my clinical judgment that the patient will require inpatient hospital care spanning beyond 2 midnights from the point of admission due to high intensity of service, high risk for further deterioration and high frequency of surveillance required.*   ------- I spent 61 minutes reviewing previous notes, at the bedside counseling/discussing the treatment plan, and performing clinical documentation.  Author: Marsha Ada, MD 01/22/2024 1:47 PM  For on call review [email or web address].

## 2024-01-22 NOTE — Op Note (Signed)
 DATE OF SERVICE: 01/22/2024  PATIENT:  William Perkins  73 y.o. male  PRE-OPERATIVE DIAGNOSIS:  atherosclerosis of native arteries of left lower extremity causing ulceration and rest pain  POST-OPERATIVE DIAGNOSIS:  Same  PROCEDURE:   Left above knee amputation  SURGEON:  Surgeons and Role:    * Magda Debby SAILOR, MD - Primary  ASSISTANT: Sheppard Apt, PA-C  An experienced assistant was required given the complexity of this procedure and the standard of surgical care. My assistant helped with exposure through counter tension, suctioning, ligation and retraction to better visualize the surgical field.  My assistant expedited sewing during the case by following my sutures. Wherever I use the term we in the report, my assistant actively helped me with that portion of the procedure.  ANESTHESIA:   general  EBL:  BLOOD ADMINISTERED:none  DRAINS: none   LOCAL MEDICATIONS USED:  NONE  SPECIMEN:  residual left leg  COUNTS: confirmed correct.  TOURNIQUET:  none  PATIENT DISPOSITION:  PACU - hemodynamically stable.   Delay start of Pharmacological VTE agent (>24hrs) due to surgical blood loss or risk of bleeding: no  INDICATION FOR PROCEDURE: William Perkins is a 73 y.o. male with non-viable left leg without options for revascularization. After careful discussion of risks, benefits, and alternatives the patient was offered left above knee amputation. The patient understood and wished to proceed.  OPERATIVE FINDINGS: healthy amputation margin.  DESCRIPTION OF PROCEDURE: After identification of the patient in the pre-operative holding area, the patient was transferred to the operating room. The patient was positioned supine on the operating room table. Anesthesia was induced. The left leg was prepped and draped in standard fashion. A surgical pause was performed confirming correct patient, procedure, and operative location.  A generous left thigh fishmouth incision was planned on  the left lower extremity with a skin marker. A sterile tourniquet was applied to the proximal thigh. An incision was made as planned. The incision was carried down with a #21 scalpel through the subcutaneous tissue, and through the posterior and anterior compartments of the thigh until the femur was encountered. The periosteum about the femur was elevated as high as possible. The femur was transected with a powered saw. The vascular bundle was found in its typical position, and was suture-ligated proximally using a 2-0 silk suture.  The sciatic nerve was identified in typical position, retracted, transected at the retracted margin, and ligated with a 2-0 silk suture.  The tourniquet was released. Hemostasis was achieved in the surgical bed. The wound was copiously irrigated. A myodesis was performed with 2-0 Vicryl. The wound was closed in layers using 2-0 Vicryl and surgical stapler.  A clean bandage was applied.  Upon completion of the case instrument and sharps counts were confirmed correct. The patient was transferred to the PACU in good condition. I was present for all portions of the procedure.  FOLLOW UP PLAN: Assuming a normal postoperative course, VVS PA will see the patient in 2-3 weeks.   Debby SAILOR. Magda, MD [facility] Phone Number: [phone] 01/22/2024 1:14 PM

## 2024-01-22 NOTE — Anesthesia Procedure Notes (Signed)
 Procedure Name: Intubation Date/Time: 01/22/2024 12:25 PM  Performed by: Julien Manus, CRNAPre-anesthesia Checklist: Patient identified, Emergency Drugs available, Suction available and Patient being monitored Patient Re-evaluated:Patient Re-evaluated prior to induction Oxygen Delivery Method: Circle System Utilized Preoxygenation: Pre-oxygenation with 100% oxygen Induction Type: IV induction Ventilation: Mask ventilation without difficulty Laryngoscope Size: Glidescope and 4 Grade View: Grade I Tube type: Oral Tube size: 7.5 mm Number of attempts: 1 Airway Equipment and Method: Stylet and Oral airway Placement Confirmation: ETT inserted through vocal cords under direct vision, positive ETCO2 and breath sounds checked- equal and bilateral Secured at: 22 cm Tube secured with: Tape Dental Injury: Teeth and Oropharynx as per pre-operative assessment

## 2024-01-22 NOTE — Anesthesia Postprocedure Evaluation (Signed)
 Anesthesia Post Note  Patient: William Perkins  Procedure(s) Performed: LEFT ABOVE KNEE AMPUTATION (Left: Knee)     Patient location during evaluation: Nursing Unit Anesthesia Type: General Level of consciousness: lethargic Pain management: pain level controlled Vital Signs Assessment: post-procedure vital signs reviewed and stable Respiratory status: spontaneous breathing, nonlabored ventilation, respiratory function stable and patient connected to nasal cannula oxygen Cardiovascular status: blood pressure returned to baseline and stable Postop Assessment: no apparent nausea or vomiting Anesthetic complications: no   No notable events documented.  Last Vitals:  Vitals:   01/22/24 1830 01/22/24 1858  BP: 92/61 (!) 91/57  Pulse: 98 99  Resp: (!) 27 (!) 25  Temp: 36.4 C 36.5 C  SpO2: 100% 100%    Last Pain:  Vitals:   01/22/24 1858  TempSrc: Oral  PainSc:                  Garnette FORBES Skillern

## 2024-01-22 NOTE — Progress Notes (Signed)
 eLink Physician-Brief Progress Note Patient Name: William Perkins DOB: 1950-04-11 MRN: [id number]   Date of Service  01/22/2024  HPI/Events of Note  Patient post-op left AKA , he coded on the floor and was transferred to the ICU, intubated and mechanically ventilated.  eICU Interventions  New Patient Evaluation.        Keidy Thurgood U Maurissa Ambrose 01/22/2024, 10:27 PM

## 2024-01-22 NOTE — Code Documentation (Signed)
  Patient Name: William Perkins   MRN: [id number]   Date of Birth/ Sex: 1951-01-04 , male      Admission Date: 01/22/2024  Attending Provider: Gatha Pence, MD  Primary Diagnosis: S/P AKA (above knee amputation) unilateral, left (HCC)   Indication: Pt was in his usual state of health until this PM, when he was noted to be unresponsive to his wife. Code blue was subsequently called. At the time of arrival on scene, ACLS protocol was underway.   Technical Description:  - CPR performance duration:  18 minutes  - Was defibrillation or cardioversion used? No   - Was external pacer placed? No  - Was patient intubated pre/post CPR? Yes   Medications Administered: Y = Yes; Blank = No Amiodarone     Atropine    Calcium   1  Epinephrine   3  Lidocaine     Magnesium     Norepinephrine     Phenylephrine     Sodium bicarbonate   2  Vasopressin      Post CPR evaluation:  - Final Status - Was patient successfully resuscitated ? Yes - What is current rhythm? sinus - What is current hemodynamic status? Off vasopressors  Miscellaneous Information:  - Labs sent, including: Per CCM attending.  - Primary team notified?  Per RN  - Family Notified? Yes; family discussion with wife and sister in the ICU  - Additional notes/ transfer status: CCM attending, Dr. Gatha at beside. ~300 mL bloody fluid in the suction cup.       Elnora Ip, MD - PGY - 3 01/22/2024, 10:09 PM

## 2024-01-22 NOTE — Transfer of Care (Signed)
 Immediate Anesthesia Transfer of Care Note  Patient: William Perkins  Procedure(s) Performed: LEFT ABOVE KNEE AMPUTATION (Left: Knee)  Patient Location: PACU  Anesthesia Type:General  Level of Consciousness: drowsy and patient cooperative  Airway & Oxygen Therapy: Patient Spontanous Breathing and Patient connected to face mask oxygen  Post-op Assessment: Report given to RN, Post -op Vital signs reviewed and stable, Patient moving all extremities, and Patient moving all extremities X 4  Post vital signs: Reviewed and stable  Last Vitals:  Vitals Value Taken Time  BP 92/66 01/22/24 13:30  Temp 36.8 C 01/22/24 13:28  Pulse 79 01/22/24 13:32  Resp 17 01/22/24 13:32  SpO2 100 % 01/22/24 13:32  Vitals shown include unfiled device data.  Last Pain:  Vitals:   01/22/24 0947  TempSrc:   PainSc: 4       Patients Stated Pain Goal: 4 (01/22/24 0947)  Complications: No notable events documented.

## 2024-01-22 NOTE — Interval H&P Note (Signed)
 History and Physical Interval Note:  01/22/2024 11:18 AM  [name]  has presented today for surgery, with the diagnosis of Atherosclerosis of native artery of left lower extremity with rest pain.  The various methods of treatment have been discussed with the patient and family. After consideration of risks, benefits and other options for treatment, the patient has consented to  Procedure(s): AMPUTATION, ABOVE KNEE (Left) as a surgical intervention.  The patient's history has been reviewed, patient examined, no change in status, stable for surgery.  I have reviewed the patient's chart and labs.  Questions were answered to the patient's satisfaction.     Debby LOISE Robertson

## 2024-01-23 ENCOUNTER — Encounter (HOSPITAL_COMMUNITY): Payer: Self-pay | Admitting: Vascular Surgery

## 2024-01-23 ENCOUNTER — Inpatient Hospital Stay (HOSPITAL_COMMUNITY)

## 2024-01-23 DIAGNOSIS — D62 Acute posthemorrhagic anemia: Secondary | ICD-10-CM

## 2024-01-23 DIAGNOSIS — R748 Abnormal levels of other serum enzymes: Secondary | ICD-10-CM | POA: Diagnosis not present

## 2024-01-23 DIAGNOSIS — N179 Acute kidney failure, unspecified: Secondary | ICD-10-CM

## 2024-01-23 DIAGNOSIS — R571 Hypovolemic shock: Secondary | ICD-10-CM

## 2024-01-23 DIAGNOSIS — K7200 Acute and subacute hepatic failure without coma: Secondary | ICD-10-CM | POA: Diagnosis not present

## 2024-01-23 DIAGNOSIS — Z9889 Other specified postprocedural states: Secondary | ICD-10-CM

## 2024-01-23 DIAGNOSIS — Z9911 Dependence on respirator [ventilator] status: Secondary | ICD-10-CM

## 2024-01-23 DIAGNOSIS — I469 Cardiac arrest, cause unspecified: Secondary | ICD-10-CM | POA: Diagnosis not present

## 2024-01-23 DIAGNOSIS — Z89612 Acquired absence of left leg above knee: Secondary | ICD-10-CM | POA: Diagnosis not present

## 2024-01-23 DIAGNOSIS — K920 Hematemesis: Secondary | ICD-10-CM

## 2024-01-23 DIAGNOSIS — J9601 Acute respiratory failure with hypoxia: Secondary | ICD-10-CM

## 2024-01-23 DIAGNOSIS — N189 Chronic kidney disease, unspecified: Secondary | ICD-10-CM

## 2024-01-23 DIAGNOSIS — D649 Anemia, unspecified: Secondary | ICD-10-CM

## 2024-01-23 LAB — GLUCOSE, CAPILLARY
Glucose-Capillary: 104 mg/dL — ABNORMAL HIGH (ref 70–99)
Glucose-Capillary: 105 mg/dL — ABNORMAL HIGH (ref 70–99)
Glucose-Capillary: 208 mg/dL — ABNORMAL HIGH (ref 70–99)
Glucose-Capillary: 241 mg/dL — ABNORMAL HIGH (ref 70–99)
Glucose-Capillary: 309 mg/dL — ABNORMAL HIGH (ref 70–99)
Glucose-Capillary: 315 mg/dL — ABNORMAL HIGH (ref 70–99)
Glucose-Capillary: 347 mg/dL — ABNORMAL HIGH (ref 70–99)
Glucose-Capillary: 367 mg/dL — ABNORMAL HIGH (ref 70–99)
Glucose-Capillary: 385 mg/dL — ABNORMAL HIGH (ref 70–99)
Glucose-Capillary: 389 mg/dL — ABNORMAL HIGH (ref 70–99)
Glucose-Capillary: 405 mg/dL — ABNORMAL HIGH (ref 70–99)
Glucose-Capillary: 419 mg/dL — ABNORMAL HIGH (ref 70–99)
Glucose-Capillary: 421 mg/dL — ABNORMAL HIGH (ref 70–99)
Glucose-Capillary: 423 mg/dL — ABNORMAL HIGH (ref 70–99)
Glucose-Capillary: 425 mg/dL — ABNORMAL HIGH (ref 70–99)
Glucose-Capillary: 430 mg/dL — ABNORMAL HIGH (ref 70–99)
Glucose-Capillary: 458 mg/dL — ABNORMAL HIGH (ref 70–99)
Glucose-Capillary: 478 mg/dL — ABNORMAL HIGH (ref 70–99)
Glucose-Capillary: 91 mg/dL (ref 70–99)
Glucose-Capillary: 92 mg/dL (ref 70–99)
Glucose-Capillary: 94 mg/dL (ref 70–99)

## 2024-01-23 LAB — TSH: TSH: 0.698 u[IU]/mL (ref 0.350–4.500)

## 2024-01-23 LAB — CBC
HCT: 20.9 % — ABNORMAL LOW (ref 39.0–52.0)
HCT: 28.5 % — ABNORMAL LOW (ref 39.0–52.0)
Hemoglobin: 6.2 g/dL — CL (ref 13.0–17.0)
Hemoglobin: 8.7 g/dL — ABNORMAL LOW (ref 13.0–17.0)
MCH: 29.6 pg (ref 26.0–34.0)
MCH: 30.5 pg (ref 26.0–34.0)
MCHC: 29.7 g/dL — ABNORMAL LOW (ref 30.0–36.0)
MCHC: 30.5 g/dL (ref 30.0–36.0)
MCV: 103 fL — ABNORMAL HIGH (ref 80.0–100.0)
MCV: 96.9 fL (ref 80.0–100.0)
Platelets: 167 K/uL (ref 150–400)
Platelets: 99 K/uL — ABNORMAL LOW (ref 150–400)
RBC: 2.03 MIL/uL — ABNORMAL LOW (ref 4.22–5.81)
RBC: 2.94 MIL/uL — ABNORMAL LOW (ref 4.22–5.81)
RDW: 14.1 % (ref 11.5–15.5)
RDW: 15.9 % — ABNORMAL HIGH (ref 11.5–15.5)
WBC: 12.4 K/uL — ABNORMAL HIGH (ref 4.0–10.5)
WBC: 7.9 K/uL (ref 4.0–10.5)
nRBC: 0.2 % (ref 0.0–0.2)
nRBC: 0.3 % — ABNORMAL HIGH (ref 0.0–0.2)

## 2024-01-23 LAB — POCT I-STAT 7, (LYTES, BLD GAS, ICA,H+H)
Acid-base deficit: 21 mmol/L — ABNORMAL HIGH (ref 0.0–2.0)
Acid-base deficit: 18 mmol/L — ABNORMAL HIGH (ref 0.0–2.0)
Acid-base deficit: 10 mmol/L — ABNORMAL HIGH (ref 0.0–2.0)
Bicarbonate: 7.4 mmol/L — ABNORMAL LOW (ref 20.0–28.0)
Bicarbonate: 10 mmol/L — ABNORMAL LOW (ref 20.0–28.0)
Bicarbonate: 17 mmol/L — ABNORMAL LOW (ref 20.0–28.0)
Calcium, Ion: 0.99 mmol/L — ABNORMAL LOW (ref 1.15–1.40)
Calcium, Ion: 0.96 mmol/L — ABNORMAL LOW (ref 1.15–1.40)
Calcium, Ion: 0.99 mmol/L — ABNORMAL LOW (ref 1.15–1.40)
HCT: 18 % — ABNORMAL LOW (ref 39.0–52.0)
HCT: 25 % — ABNORMAL LOW (ref 39.0–52.0)
HCT: 25 % — ABNORMAL LOW (ref 39.0–52.0)
Hemoglobin: 6.1 g/dL — CL (ref 13.0–17.0)
Hemoglobin: 8.5 g/dL — ABNORMAL LOW (ref 13.0–17.0)
Hemoglobin: 8.5 g/dL — ABNORMAL LOW (ref 13.0–17.0)
O2 Saturation: 98 %
O2 Saturation: 97 %
O2 Saturation: 99 %
Patient temperature: 32.8
Patient temperature: 33.2
Patient temperature: 35.7
Potassium: 4.6 mmol/L (ref 3.5–5.1)
Potassium: 4.4 mmol/L (ref 3.5–5.1)
Potassium: 4 mmol/L (ref 3.5–5.1)
Sodium: 137 mmol/L (ref 135–145)
Sodium: 136 mmol/L (ref 135–145)
Sodium: 139 mmol/L (ref 135–145)
TCO2: 8 mmol/L — ABNORMAL LOW (ref 22–32)
TCO2: 11 mmol/L — ABNORMAL LOW (ref 22–32)
TCO2: 18 mmol/L — ABNORMAL LOW (ref 22–32)
pCO2 arterial: 21.9 mmHg — ABNORMAL LOW (ref 32–48)
pCO2 arterial: 26.5 mmHg — ABNORMAL LOW (ref 32–48)
pCO2 arterial: 40.4 mmHg (ref 32–48)
pH, Arterial: 7.107 — CL (ref 7.35–7.45)
pH, Arterial: 7.162 — CL (ref 7.35–7.45)
pH, Arterial: 7.227 — ABNORMAL LOW (ref 7.35–7.45)
pO2, Arterial: 133 mmHg — ABNORMAL HIGH (ref 83–108)
pO2, Arterial: 94 mmHg (ref 83–108)
pO2, Arterial: 148 mmHg — ABNORMAL HIGH (ref 83–108)

## 2024-01-23 LAB — DIC (DISSEMINATED INTRAVASCULAR COAGULATION)PANEL
D-Dimer, Quant: >20 ug{FEU}/mL — ABNORMAL HIGH (ref 0.00–0.50)
D-Dimer, Quant: >20 ug{FEU}/mL — ABNORMAL HIGH (ref 0.00–0.50)
Fibrinogen: <60 mg/dL — CL (ref 210–475)
Fibrinogen: 77 mg/dL — CL (ref 210–475)
INR: 4 — ABNORMAL HIGH (ref 0.8–1.2)
INR: 3.1 — ABNORMAL HIGH (ref 0.8–1.2)
Platelets: 141 K/uL — ABNORMAL LOW (ref 150–400)
Platelets: 106 K/uL — ABNORMAL LOW (ref 150–400)
Prothrombin Time: 40.5 s — ABNORMAL HIGH (ref 11.4–15.2)
Prothrombin Time: 33.8 s — ABNORMAL HIGH (ref 11.4–15.2)
aPTT: 74 s — ABNORMAL HIGH (ref 24–36)
aPTT: 61 s — ABNORMAL HIGH (ref 24–36)

## 2024-01-23 LAB — ECHOCARDIOGRAM COMPLETE
AR max vel: 0.83 cm2
AV Area VTI: 1.15 cm2
AV Area mean vel: 0.84 cm2
AV Mean grad: 10 mmHg
AV Peak grad: 19.5 mmHg
Ao pk vel: 2.21 m/s
Area-P 1/2: 4.74 cm2
Height: 71 in
MV M vel: 4.09 m/s
MV Peak grad: 66.9 mmHg
S' Lateral: 3.3 cm
Weight: 2158.74 [oz_av]

## 2024-01-23 LAB — COMPREHENSIVE METABOLIC PANEL WITH GFR
ALT: 3740 U/L — ABNORMAL HIGH (ref 0–44)
ALT: 3913 U/L — ABNORMAL HIGH (ref 0–44)
AST: 2721 U/L — ABNORMAL HIGH (ref 15–41)
AST: 3336 U/L — ABNORMAL HIGH (ref 15–41)
Albumin: 2.1 g/dL — ABNORMAL LOW (ref 3.5–5.0)
Albumin: 2 g/dL — ABNORMAL LOW (ref 3.5–5.0)
Alkaline Phosphatase: 225 U/L — ABNORMAL HIGH (ref 38–126)
Alkaline Phosphatase: 243 U/L — ABNORMAL HIGH (ref 38–126)
Anion gap: 33 — ABNORMAL HIGH (ref 5–15)
Anion gap: 37 — ABNORMAL HIGH (ref 5–15)
BUN: 32 mg/dL — ABNORMAL HIGH (ref 8–23)
BUN: 35 mg/dL — ABNORMAL HIGH (ref 8–23)
CO2: 8 mmol/L — ABNORMAL LOW (ref 22–32)
CO2: 10 mmol/L — ABNORMAL LOW (ref 22–32)
Calcium: 8.4 mg/dL — ABNORMAL LOW (ref 8.9–10.3)
Calcium: 8.2 mg/dL — ABNORMAL LOW (ref 8.9–10.3)
Chloride: 99 mmol/L (ref 98–111)
Chloride: 93 mmol/L — ABNORMAL LOW (ref 98–111)
Creatinine, Ser: 3.24 mg/dL — ABNORMAL HIGH (ref 0.61–1.24)
Creatinine, Ser: 3.4 mg/dL — ABNORMAL HIGH (ref 0.61–1.24)
GFR, Estimated: 19 mL/min — ABNORMAL LOW (ref >60)
GFR, Estimated: 18 mL/min — ABNORMAL LOW (ref >60)
Glucose, Bld: 356 mg/dL — ABNORMAL HIGH (ref 70–99)
Glucose, Bld: 506 mg/dL (ref 70–99)
Potassium: 4.9 mmol/L (ref 3.5–5.1)
Potassium: 4.5 mmol/L (ref 3.5–5.1)
Sodium: 140 mmol/L (ref 135–145)
Sodium: 140 mmol/L (ref 135–145)
Total Bilirubin: 1.7 mg/dL — ABNORMAL HIGH (ref 0.0–1.2)
Total Bilirubin: 1.8 mg/dL — ABNORMAL HIGH (ref 0.0–1.2)
Total Protein: 4.6 g/dL — ABNORMAL LOW (ref 6.5–8.1)
Total Protein: 4.3 g/dL — ABNORMAL LOW (ref 6.5–8.1)

## 2024-01-23 LAB — COOXEMETRY PANEL
Carboxyhemoglobin: 3.8 % — ABNORMAL HIGH (ref 0.5–1.5)
Methemoglobin: 2.2 % — ABNORMAL HIGH (ref 0.0–1.5)
O2 Saturation: 97.9 %
Total hemoglobin: <6.9 g/dL — CL (ref 12.0–16.0)

## 2024-01-23 LAB — HEMOGLOBIN AND HEMATOCRIT, BLOOD
HCT: 27.7 % — ABNORMAL LOW (ref 39.0–52.0)
Hemoglobin: 9 g/dL — ABNORMAL LOW (ref 13.0–17.0)

## 2024-01-23 LAB — PREPARE RBC (CROSSMATCH)

## 2024-01-23 LAB — PROTIME-INR
INR: 4.2 (ref 0.8–1.2)
INR: 4.6 (ref 0.8–1.2)
Prothrombin Time: 42.1 s — ABNORMAL HIGH (ref 11.4–15.2)
Prothrombin Time: 45.6 s — ABNORMAL HIGH (ref 11.4–15.2)

## 2024-01-23 LAB — TROPONIN I (HIGH SENSITIVITY)
Troponin I (High Sensitivity): 1414 ng/L (ref <18)
Troponin I (High Sensitivity): 11446 ng/L (ref <18)

## 2024-01-23 LAB — MAGNESIUM
Magnesium: 2.4 mg/dL (ref 1.7–2.4)
Magnesium: 2.1 mg/dL (ref 1.7–2.4)

## 2024-01-23 LAB — FIBRINOGEN
Fibrinogen: 65 mg/dL — CL (ref 210–475)
Fibrinogen: <60 mg/dL — CL (ref 210–475)

## 2024-01-23 LAB — LACTIC ACID, PLASMA
Lactic Acid, Venous: >9 mmol/L (ref 0.5–1.9)
Lactic Acid, Venous: >9 mmol/L (ref 0.5–1.9)

## 2024-01-23 LAB — APTT
aPTT: 170 s (ref 24–36)
aPTT: 91 s — ABNORMAL HIGH (ref 24–36)

## 2024-01-23 LAB — MRSA NEXT GEN BY PCR, NASAL: MRSA by PCR Next Gen: NOT DETECTED

## 2024-01-23 MED ORDER — SODIUM BICARBONATE 8.4 % IV SOLN
100.0000 meq | Freq: Once | INTRAVENOUS | Status: AC
  Administered 2024-01-23: 100 meq via INTRAVENOUS
  Filled 2024-01-23: qty 100

## 2024-01-23 MED ORDER — FENTANYL BOLUS VIA INFUSION
25.0000 ug | INTRAVENOUS | Status: DC | PRN
  Administered 2024-01-23: 75 ug via INTRAVENOUS
  Administered 2024-01-24: 50 ug via INTRAVENOUS
  Administered 2024-01-24 – 2024-01-25 (×4): 100 ug via INTRAVENOUS

## 2024-01-23 MED ORDER — PHENYLEPHRINE HCL-NACL 20-0.9 MG/250ML-% IV SOLN
0.0000 ug/min | INTRAVENOUS | Status: DC
  Administered 2024-01-23: 20 ug/min via INTRAVENOUS
  Filled 2024-01-23: qty 250

## 2024-01-23 MED ORDER — INSULIN ASPART 100 UNIT/ML IJ SOLN
0.0000 [IU] | INTRAMUSCULAR | Status: DC
  Administered 2024-01-23 (×2): 6 [IU] via SUBCUTANEOUS
  Filled 2024-01-23 (×2): qty 6

## 2024-01-23 MED ORDER — CALCIUM GLUCONATE-NACL 2-0.675 GM/100ML-% IV SOLN
2.0000 g | Freq: Once | INTRAVENOUS | Status: AC
  Filled 2024-01-23: qty 100

## 2024-01-23 MED ORDER — SODIUM CHLORIDE 0.9 % IV SOLN: INTRAVENOUS | Status: AC | PRN

## 2024-01-23 MED ORDER — CALCIUM GLUCONATE-NACL 2-0.675 GM/100ML-% IV SOLN
2.0000 g | Freq: Once | INTRAVENOUS | Status: AC
  Administered 2024-01-23: 2000 mg via INTRAVENOUS
  Filled 2024-01-23: qty 100

## 2024-01-23 MED ORDER — DEXTROSE 50 % IV SOLN: 0.0000 mL | INTRAVENOUS | Status: DC | PRN

## 2024-01-23 MED ORDER — STERILE WATER FOR INJECTION IV SOLN
INTRAVENOUS | Status: DC
  Filled 2024-01-23: qty 150
  Filled 2024-01-23: qty 1000
  Filled 2024-01-23 (×2): qty 150

## 2024-01-23 MED ORDER — NOREPINEPHRINE 16 MG/250ML-% IV SOLN
0.0000 ug/min | INTRAVENOUS | Status: DC
Start: 2024-01-23 — End: 2024-01-23

## 2024-01-23 MED ORDER — SODIUM CHLORIDE 0.9% IV SOLUTION: Freq: Once | INTRAVENOUS | Status: DC

## 2024-01-23 MED ORDER — INSULIN REGULAR(HUMAN) IN NACL 100-0.9 UT/100ML-% IV SOLN
INTRAVENOUS | Status: DC
  Administered 2024-01-23: 5.5 [IU]/h via INTRAVENOUS
  Administered 2024-01-23: 8 [IU]/h via INTRAVENOUS
  Filled 2024-01-23: qty 100

## 2024-01-23 MED ORDER — PANTOPRAZOLE SODIUM 40 MG IV SOLR
40.0000 mg | Freq: Two times a day (BID) | INTRAVENOUS | Status: DC
  Administered 2024-01-23 (×2): 40 mg via INTRAVENOUS
  Filled 2024-01-23 (×2): qty 10

## 2024-01-23 MED ORDER — SODIUM CHLORIDE 0.9% IV SOLUTION: Freq: Once | INTRAVENOUS | Status: AC

## 2024-01-23 MED ORDER — NOREPINEPHRINE 16 MG/250ML-% IV SOLN
0.0000 ug/min | INTRAVENOUS | Status: DC
  Administered 2024-01-23: 17 ug/min via INTRAVENOUS
  Administered 2024-01-23: 43 ug/min via INTRAVENOUS
  Administered 2024-01-23: 60 ug/min via INTRAVENOUS
  Administered 2024-01-24: 10 ug/min via INTRAVENOUS
  Administered 2024-01-25: 3 ug/min via INTRAVENOUS
  Administered 2024-01-26: 60 ug/min via INTRAVENOUS
  Administered 2024-01-26: 16 ug/min via INTRAVENOUS
  Filled 2024-01-23 (×6): qty 250

## 2024-01-23 MED ORDER — INSULIN ASPART 100 UNIT/ML IJ SOLN
0.0000 [IU] | INTRAMUSCULAR | Status: DC
  Administered 2024-01-24: 3 [IU] via SUBCUTANEOUS
  Administered 2024-01-24: 2 [IU] via SUBCUTANEOUS
  Filled 2024-01-23: qty 3
  Filled 2024-01-23: qty 2

## 2024-01-23 MED ORDER — SODIUM BICARBONATE 8.4 % IV SOLN
INTRAVENOUS | Status: AC
  Filled 2024-01-23: qty 100

## 2024-01-23 MED ORDER — SODIUM BICARBONATE 8.4 % IV SOLN
100.0000 meq | Freq: Once | INTRAVENOUS | Status: AC
  Administered 2024-01-23: 100 meq via INTRAVENOUS

## 2024-01-23 MED ORDER — PANTOPRAZOLE SODIUM 40 MG IV SOLR
40.0000 mg | Freq: Two times a day (BID) | INTRAVENOUS | Status: DC
  Administered 2024-01-23 – 2024-01-26 (×7): 40 mg via INTRAVENOUS
  Filled 2024-01-23 (×6): qty 10

## 2024-01-23 MED ORDER — VITAMIN K1 10 MG/ML IJ SOLN
5.0000 mg | INTRAVENOUS | Status: AC
  Administered 2024-01-23: 5 mg via INTRAVENOUS
  Filled 2024-01-23: qty 0.5

## 2024-01-23 MED ORDER — SODIUM CHLORIDE 0.9 % IV SOLN
2.0000 g | INTRAVENOUS | Status: DC
  Administered 2024-01-23 – 2024-01-26 (×4): 2 g via INTRAVENOUS
  Filled 2024-01-23 (×4): qty 20

## 2024-01-23 MED ORDER — NOREPINEPHRINE 16 MG/250ML-% IV SOLN
0.0000 ug/min | INTRAVENOUS | Status: DC
  Administered 2024-01-23: 26 ug/min via INTRAVENOUS
  Filled 2024-01-23: qty 250

## 2024-01-23 MED ORDER — SODIUM CHLORIDE 0.9 % IV SOLN
4.0000 g | Freq: Once | INTRAVENOUS | Status: AC
  Administered 2024-01-23: 4 g via INTRAVENOUS
  Filled 2024-01-23: qty 40

## 2024-01-23 MED ORDER — CALCIUM GLUCONATE-NACL 1-0.675 GM/50ML-% IV SOLN
INTRAVENOUS | Status: AC
  Administered 2024-01-23: 2000 mg
  Filled 2024-01-23: qty 100

## 2024-01-23 MED ORDER — CHLORHEXIDINE GLUCONATE CLOTH 2 % EX PADS
6.0000 | MEDICATED_PAD | CUTANEOUS | Status: DC
  Administered 2024-01-24 – 2024-01-25 (×2): 6 via TOPICAL

## 2024-01-23 MED ORDER — PERFLUTREN LIPID MICROSPHERE
1.0000 mL | INTRAVENOUS | Status: AC | PRN
  Administered 2024-01-23: 3 mL via INTRAVENOUS

## 2024-01-23 MED ORDER — ORAL CARE MOUTH RINSE
15.0000 mL | OROMUCOSAL | Status: DC
  Administered 2024-01-23 – 2024-01-26 (×44): 15 mL via OROMUCOSAL

## 2024-01-23 MED ORDER — INSULIN REGULAR(HUMAN) IN NACL 100-0.9 UT/100ML-% IV SOLN: INTRAVENOUS | Status: DC

## 2024-01-23 MED ORDER — ORAL CARE MOUTH RINSE: 15.0000 mL | OROMUCOSAL | Status: DC | PRN

## 2024-01-23 MED ORDER — FENTANYL CITRATE (PF) 50 MCG/ML IJ SOSY
50.0000 ug | PREFILLED_SYRINGE | INTRAMUSCULAR | Status: DC | PRN
  Administered 2024-01-25: 50 ug via INTRAVENOUS
  Filled 2024-01-23: qty 1

## 2024-01-23 MED ORDER — IOHEXOL 350 MG/ML SOLN
75.0000 mL | Freq: Once | INTRAVENOUS | Status: AC | PRN
  Administered 2024-01-23: 75 mL via INTRAVENOUS

## 2024-01-23 MED ORDER — FENTANYL CITRATE (PF) 50 MCG/ML IJ SOSY
50.0000 ug | PREFILLED_SYRINGE | INTRAMUSCULAR | Status: DC | PRN
  Administered 2024-01-23: 50 ug via INTRAVENOUS
  Administered 2024-01-23: 75 ug via INTRAVENOUS
  Administered 2024-01-23: 50 ug via INTRAVENOUS

## 2024-01-23 MED ORDER — NOREPINEPHRINE 16 MG/250ML-% IV SOLN: 0.0000 ug/min | INTRAVENOUS | Status: DC

## 2024-01-23 NOTE — Progress Notes (Addendum)
 OT Cancellation Note  Patient Details Name: William Perkins MRN: [id number] DOB: [birth year]   Cancelled Treatment:    Reason Eval/Treat Not Completed: Patient not medically ready. Recent Code with 18 min CPR, low Hemoglobin, low BP, intubated/sedated. OT will sign off at this time and will await re-order from medical team when appropriate.    Leita PARAS Adreonna Yontz 01/23/2024, 9:04 AM  Leita DEL OTR/L Acute Rehabilitation Services Office: [phone]

## 2024-01-23 NOTE — Progress Notes (Signed)
 VASCULAR LAB    Lower extremity venous duplex has been performed.  See CV proc for preliminary results.   Zaydrian Batta, RVT 01/23/2024, 5:17 PM

## 2024-01-23 NOTE — Progress Notes (Signed)
 eLink Physician-Brief Progress Note Patient Name: FILBERT CRAZE DOB: Apr 27, 1950 MRN: [id number]   Date of Service  01/23/2024  HPI/Events of Note  Patient with hyperglycemia.  eICU Interventions  Bicarb gtt switched to non-glucose containing gtt, CBG with SSI coverage ordered.        Regan Mcbryar U Belita Warsame 01/23/2024, 4:08 AM

## 2024-01-23 NOTE — Consult Note (Addendum)
 Nephrology Consult   Requesting provider: [facility] Service requesting consult: CCM Reason for consult: AKI, severe acidosis   Assessment/Recommendations: William Perkins is a/an 73 y.o. male with a past medical history PVD, CKD, HTN, HLD who presents with cardiac arrest c/b AKI, shock, and acidosis  AKI on CKD 3b: Baseline creatinine around 2-2.4.  Creatinine up to 3.4.  Urine output seems fairly low.  Likely ATN in the setting of shock and cardiac arrest -Continue with supportive care -Consider dialysis/CRRT if the patient does not improve -Continue to monitor daily Cr, Dose meds for GFR -Monitor Daily I/Os, Daily weight  -Maintain MAP>65 for optimal renal perfusion.  -Avoid nephrotoxic medications including NSAIDs -Use synthetic opioids (Fentanyl /Dilaudid ) if needed - Foley catheter  Shock: Hemorrhagic.  May be vasoplegic or septic.  Continue pressors per primary team  Acute hypoxic respiratory failure: Associated with aspiration.  Management per primary team continue ventilation  Severe anion gap metabolic acidosis: Associated with lactic acidosis.  Continue with treatment of shock which should allow clearance of lactate.  Consider dialysis if he does not improve  PEA arrest: On 11/19 associated with surgery.  Peripheral vascular disease: Status post amputation on 11/19 with vascular surgery  Anemia: Likely related blood loss.  Transfusion as needed.   Recommendations conveyed to primary service.    [facility] 01/23/2024 11:11 AM   _____________________________________________________________________________________ CC: cardiac arrest  History of Present Illness: William Perkins is a/an 73 y.o. male with a past medical history of PVD, CKD, HTN, HLD who presents with cardiac arrest.  The patient was intubated and sedated so history was obtained per chart review.  Patient was admitted to the hospital yesterday for left  above-the-knee amputation.  Postoperatively he was drowsy and ultimately suffered cardiac arrest felt to be PEA.  He underwent CPR with ACS protocol for 18 minutes before ROSC was obtained.  Patient also had some bloody emesis around that time.  Patient was transferred to the ICU with hypotension.  Patient required pressors.  Bronchoscopy was performed and demonstrated some blood without active bleeding.  It was felt he likely aspirated with hematemesis.  Medications:  Current Facility-Administered Medications  Medication Dose Route Frequency Provider Last Rate Last Admin   0.9 %  sodium chloride  infusion (Manually program via Guardrails IV Fluids)   Intravenous Once Wilson, Tara N, PA-C       0.9 %  sodium chloride  infusion (Manually program via Guardrails IV Fluids)   Intravenous Once Tanda, Tara N, PA-C       0.9 %  sodium chloride  infusion (Manually program via Guardrails IV Fluids)   Intravenous Once Ogan, Okoronkwo U, MD       0.9 %  sodium chloride  infusion (Manually program via Guardrails IV Fluids)   Intravenous Once Ogan, Okoronkwo U, MD       0.9 %  sodium chloride  infusion  250 mL Intravenous Continuous Mohammed, Shahid, MD 10 mL/hr at 01/23/24 1000 Infusion Verify at 01/23/24 1000   0.9 %  sodium chloride  infusion   Intravenous PRN Gatha Pence, MD       azithromycin  (ZITHROMAX ) 500 mg in sodium chloride  0.9 % 250 mL IVPB  500 mg Intravenous Q24H Gatha Pence, MD   Stopped at 01/23/24 0228   cefTRIAXone  (ROCEPHIN ) 1 g in sodium chloride  0.9 % 100 mL IVPB  1 g Intravenous Q24H Mohammed, Shahid, MD       Chlorhexidine  Gluconate Cloth 2 % PADS 6 each  6 each Topical  Daily Gatha Pence, MD       dexmedetomidine  (PRECEDEX ) 400 MCG/100ML (4 mcg/mL) infusion  0-1.2 mcg/kg/hr Intravenous Titrated Gatha Pence, MD 8.72 mL/hr at 01/23/24 1045 0.6 mcg/kg/hr at 01/23/24 1045   docusate (COLACE) 50 MG/5ML liquid 100 mg  100 mg Per Tube BID PRN Gatha Pence, MD       fentaNYL   (SUBLIMAZE ) bolus via infusion 25-100 mcg  25-100 mcg Intravenous Q15 min PRN Ilah Krabbe M, PA-C       fentaNYL  (SUBLIMAZE ) injection 50 mcg  50 mcg Intravenous Q15 min PRN Ilah Krabbe M, PA-C       fentaNYL  in NS (29mcg/ml) infusion-PREMIX  0-400 mcg/hr Intravenous Continuous Mohammed, Shahid, MD 7.5 mL/hr at 01/23/24 1000 75 mcg/hr at 01/23/24 1000   insulin  regular, human (MYXREDLIN ) 100 units/ 100 mL infusion   Intravenous Continuous Ilah Krabbe M, PA-C 5.5 mL/hr at 01/23/24 1000 5.5 Units/hr at 01/23/24 1000   norepinephrine  (LEVOPHED ) 16 mg in 250mL (0.064 mg/mL) premix infusion  0-60 mcg/min Intravenous Titrated Laron Agent, RPH 40.3 mL/hr at 01/23/24 1045 43 mcg/min at 01/23/24 1045   ondansetron  (ZOFRAN ) injection 4 mg  4 mg Intravenous Q6H PRN Rhyne, Samantha J, PA-C       Oral care mouth rinse  15 mL Mouth Rinse Q2H Ogan, Okoronkwo U, MD   15 mL at 01/23/24 0945   Oral care mouth rinse  15 mL Mouth Rinse PRN Ogan, Okoronkwo U, MD       pantoprazole  (PROTONIX ) injection 40 mg  40 mg Intravenous Q12H Ogan, Okoronkwo U, MD   40 mg at 01/23/24 9071   perflutren  lipid microspheres (DEFINITY ) IV suspension  1-10 mL Intravenous PRN Gatha Pence, MD   3 mL at 01/23/24 1021   phenol (CHLORASEPTIC) mouth spray 1 spray  1 spray Mouth/Throat PRN Rhyne, Samantha J, PA-C       phenylephrine  (NEO-SYNEPHRINE) 20mg /NS premix infusion  0-400 mcg/min Intravenous Titrated Ogan, Okoronkwo U, MD   Stopped at 01/23/24 0644   polyethylene glycol (MIRALAX  / GLYCOLAX ) packet 17 g  17 g Per Tube Daily PRN Gatha Pence, MD       sodium bicarbonate  1 mEq/mL injection            sodium bicarbonate  150 mEq in sterile water  1,150 mL infusion   Intravenous Continuous Ogan, Okoronkwo U, MD 150 mL/hr at 01/23/24 1000 Infusion Verify at 01/23/24 1000   vasopressin  (PITRESSIN) 20 Units in 100 mL (0.2 unit/mL) infusion-*FOR SHOCK*  0.04 Units/min Intravenous Continuous Mohammed,  Shahid, MD 12 mL/hr at 01/23/24 1000 0.04 Units/min at 01/23/24 1000     ALLERGIES Patient has no known allergies.  MEDICAL HISTORY Past Medical History:  Diagnosis Date   Cancer (HCC) 02/2023   laryngeal cancer   Chronic renal insufficiency 03/21/2006   CrCl <50 ml   DDD (degenerative disc disease), cervical    GERD 03/22/2006   Qualifier: Diagnosis of  By: [name]     GERD (gastroesophageal reflux disease) 03/22/2006   Hand numbness 10/07/2009   pt states not any longer   Hyperlipidemia 03/22/2008   Hypertension    Hypertension 03/22/2006   Left ventricular hypertrophy 03/22/2006   Leg pain, left 11/15/2009   Lumbar strain 07/15/2008   Renal insufficiency    Soft tissue disorder 08/2008   Tobacco abuse 03/22/2006   2 ppd x 30 years     SOCIAL HISTORY Social History   Socioeconomic History   Marital status: Married  Spouse name: Anette   Number of children: 0   Years of education: 41   Highest education level: Not on file  Occupational History   Occupation: Retired  Tobacco Use   Smoking status: Former    Current packs/day: 0.00    Average packs/day: 1 pack/day for 44.0 years (44.0 ttl pk-yrs)    Types: Cigarettes    Start date: 11/21/1969    Quit date: 11/21/2013    Years since quitting: 10.1   Smokeless tobacco: Never   Tobacco comments:    Stopped 11/21/2013   Vaping Use   Vaping status: Never Used  Substance and Sexual Activity   Alcohol use: No    Alcohol/week: 0.0 standard drinks of alcohol    Comment: Quit 2015   Drug use: No   Sexual activity: Yes    Partners: Female  Other Topics Concern   Not on file  Social History Narrative   Not on file   Social Drivers of Health   Financial Resource Strain: Low Risk  (11/27/2023)   Overall Financial Resource Strain (CARDIA)    Difficulty of Paying Living Expenses: Not hard at all  Food Insecurity: No Food Insecurity (12/13/2023)   Hunger Vital Sign    Worried About Running Out of Food  in the Last Year: Never true    Ran Out of Food in the Last Year: Never true  Transportation Needs: No Transportation Needs (12/13/2023)   PRAPARE - Administrator, Civil Service (Medical): No    Lack of Transportation (Non-Medical): No  Physical Activity: Insufficiently Active (11/27/2023)   Exercise Vital Sign    Days of Exercise per Week: 7 days    Minutes of Exercise per Session: 20 min  Stress: No Stress Concern Present (11/27/2023)   [employer] of Occupational Health - Occupational Stress Questionnaire    Feeling of Stress: Not at all  Social Connections: Moderately Isolated (11/27/2023)   Social Connection and Isolation Panel    Frequency of Communication with Friends and Family: Once a week    Frequency of Social Gatherings with Friends and Family: Three times a week    Attends Religious Services: Never    Active Member of Clubs or Organizations: No    Attends Banker Meetings: Never    Marital Status: Married  Catering Manager Violence: Not At Risk (12/13/2023)   Humiliation, Afraid, Rape, and Kick questionnaire    Fear of Current or Ex-Partner: No    Emotionally Abused: No    Physically Abused: No    Sexually Abused: No     FAMILY HISTORY Family History  Problem Relation Age of Onset   Diabetes Mother    Diabetes Sister    Diabetes Brother    Heart disease Brother    Rectal cancer Neg Hx    Stomach cancer Neg Hx    Colon cancer Neg Hx       Review of Systems: 12 systems reviewed Otherwise as per HPI, all other systems reviewed and negative  Physical Exam: Vitals:   01/23/24 0945 01/23/24 1000  BP:    Pulse: 90 91  Resp: 15 16  Temp: (!) 96.3 F (35.7 C) (!) 96.3 F (35.7 C)  SpO2: 99% 99%   Total I/O In: 1152.3 [I.V.:1001.4; IV Piggyback:150.9] Out: -   Intake/Output Summary (Last 24 hours) at 01/23/2024 1111 Last data filed at 01/23/2024 1000 Gross per 24 hour  Intake 5195.24 ml  Output 1250 ml  Net 3945.24 ml  General: Ill-appearing, lying in bed, sedated HEENT: Eyes closed, no nasal discharge, moist mucous membranes CV: Normal rate, no rub Lungs: Coarse bilateral breath sounds, ventilated, bilateral chest rise Abd: soft, non-tender, non-distended Skin: no visible lesions or rashes Psych: Sedated, not interactive Musculoskeletal: left leg absent above the knee, no obvious deformities Neuro: Sedated, noninteractive  Test Results Reviewed Lab Results  Component Value Date   NA 139 01/23/2024   K 4.0 01/23/2024   CL 93 (L) 01/23/2024   CO2 10 (L) 01/23/2024   BUN 35 (H) 01/23/2024   CREATININE 3.40 (H) 01/23/2024   CALCIUM  8.2 (L) 01/23/2024   ALBUMIN  2.0 (L) 01/23/2024    CBC Recent Labs  Lab 01/22/24 1614 01/22/24 2336 01/23/24 0003 01/23/24 0209 01/23/24 0630 01/23/24 0632 01/23/24 0958  WBC 8.8  --  12.4*  --   --  7.9  --   HGB 7.4*   < > 6.2*   < > 8.5* 8.7* 8.5*  HCT 23.1*   < > 20.9*   < > 25.0* 28.5* 25.0*  MCV 95.1  --  103.0*  --   --  96.9  --   PLT 286  --  167  --   --  99*  --    < > = values in this interval not displayed.    I have reviewed all relevant outside healthcare records related to the patient's current hospitalization

## 2024-01-23 NOTE — TOC CM/SW Note (Signed)
 Transition of Care [facility]) - Inpatient Brief Assessment   Patient Details  Name: William Perkins MRN: [id number] Date of Birth: 10/09/1950  [facility] CM/SW Contact:    Tom-Johnson, Harvest Muskrat, RN Phone Number: 01/23/2024, 12:50 PM   Clinical Narrative:  Patient underwent Lt AKA by Dr. Magda yesterday 01/22/24. Patient experienced Cardiac Arrest post operative in PACU, 18 min CPR performed and patient transferred to ICU requiring Pressors. Currently intubated and sedated. Nephrology consulted for AKI on CKD 3b.   No ICM needs or recommendations noted at this time.  Patient not Medically ready for discharge.  CM will continue to follow as patient progresses with care towards discharge.          Transition of Care Asessment:

## 2024-01-23 NOTE — Progress Notes (Signed)
 [facility] [place] PROGRESS NOTE  ASSESSMENT / PLAN: William Perkins is a 73 y.o. male status post left above knee amputation. Immediate postop course complicated by cardiac arrest with ROSC. Now in likely cardiogenic shock. Suspect coronary artery disease as root cause given his profound atherosclerosis, but workup ongoing. Appreciate PCCM and IM teams for their excellent care.   From a surgical perspective, recommend keeping bandage x 7 days. Will follow.  SUBJECTIVE: Events of last night noted. Wife at bedside. Patietn intubated and sedated.   OBJECTIVE: BP (!) 83/35   Pulse 97   Temp (!) 95.4 F (35.2 C) (Esophageal)   Resp 17   Ht 5' 11 (1.803 m)   Wt 61.2 kg   SpO2 98%   BMI 18.82 kg/m   Intake/Output Summary (Last 24 hours) at 01/23/2024 0944 Last data filed at 01/23/2024 0800 Gross per 24 hour  Intake 4661.89 ml  Output 1250 ml  Net 3411.89 ml    Appears comfortable. GCS 3T. RASS -2.  Intubated and sedated. Vasopressor agents x 2 infusing Left leg appears OK without hematoma     Latest Ref Rng & Units 01/23/2024    6:32 AM 01/23/2024    6:30 AM 01/23/2024    2:09 AM  CBC  WBC 4.0 - 10.5 K/uL 7.9     Hemoglobin 13.0 - 17.0 g/dL 8.7  8.5  6.1   Hematocrit 39.0 - 52.0 % 28.5  25.0  18.0   Platelets 150 - 400 K/uL 99           Latest Ref Rng & Units 01/23/2024    6:32 AM 01/23/2024    6:30 AM 01/23/2024    2:09 AM  CMP  Glucose 70 - 99 mg/dL 493     BUN 8 - 23 mg/dL 35     Creatinine 9.38 - 1.24 mg/dL 6.59     Sodium 864 - 854 mmol/L 140  136  137   Potassium 3.5 - 5.1 mmol/L 4.5  4.4  4.6   Chloride 98 - 111 mmol/L 93     CO2 22 - 32 mmol/L 10     Calcium  8.9 - 10.3 mg/dL 8.2     Total Protein 6.5 - 8.1 g/dL 4.3     Total Bilirubin 0.0 - 1.2 mg/dL 1.8     Alkaline Phos 38 - 126 U/L 243     AST 15 - 41 U/L 3,336     ALT 0 - 44 U/L 3,913       Estimated Creatinine Clearance: 16.8 mL/min (A) (by C-G formula based on SCr of  3.4 mg/dL (H)).  Debby SAILOR. Magda, MD [facility] Phone Number: [phone] 01/23/2024 9:44 AM

## 2024-01-23 NOTE — Progress Notes (Addendum)
 NAME:  William Perkins, MRN:  [id number], DOB:  02/17/1951, LOS: 1 ADMISSION DATE:  01/22/2024, CONSULTATION DATE: 01/22/2024 REFERRING PHYSICIAN: Georgina - TRH, CHIEF COMPLAINT:  Cardiac Arrest  History of Present Illness:  73 year old man who presented to [facility] 11/19 for elective L AKA. 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  Patient underwent L AKA 11/19 with Dr. Magda (VVS). Intraoperative course was uncomplicated with EBL. Patient was transferred to PACU in stable condition. Postoperatively while in PACU, patient was noted to be very drowsy and became bradycardic. Rhythm deteriorated into PEA and Code Blue was called. CPR was performed immediately per ACS protocol x 18 minutes with ROSC. Of note, patient had episode of bloody emesis prior to intubation.  On arrival to ICU, patient was initially hemodynamically stable but slowly becoming more hypotensive EKG showed first-degree AV block sinus rhythm with LBBB.  CXR demonstrated bilateral infiltrates (R > L) with concern for aspiration. Levophed and vasopressin were started and A-line/CVC placed in ICU. Bronch was completed showing diffuse blood, but no active bleeding; likely aspirated hematemesis. ABG demosntrated significant metabolic acidosis. Bicarb drip was started. Hgb 6.2 and 2U PRBCs ordered + FFP/cryo. Bedside POCUS showed no pericardial effusion no right heart strain and EF appeared low.  PCCM consulted for ICU admission and management.  Pertinent  Medical History   Past Medical History:  Diagnosis Date   Cancer (HCC) 02/2023   laryngeal cancer   Chronic renal insufficiency 03/21/2006   CrCl <50 ml   DDD (degenerative disc disease), cervical    GERD 03/22/2006   Qualifier: Diagnosis of  By: [name]     GERD (gastroesophageal reflux  disease) 03/22/2006   Hand numbness 10/07/2009   pt states not any longer   Hyperlipidemia 03/22/2008   Hypertension    Hypertension 03/22/2006   Left ventricular hypertrophy 03/22/2006   Leg pain, left 11/15/2009   Lumbar strain 07/15/2008   Renal insufficiency    Soft tissue disorder 08/2008   Tobacco abuse 03/22/2006   2 ppd x 30 years   Significant Hospital Events: Including procedures, antibiotic start and stop dates in addition to other pertinent events   11/19 - Left AKA completed, sustained PEA arrest postoperatively. Intubated.  Interim History / Subjective:  Admitted overnight s/p cardiac arrest CPR x 18 min Remains coagulopathic with ongoing bloody output from OGT and ETT (frothy, bloody secretions) CXR with moderate bilateral central hilar opacification, R > L favoring edema/aspiration of blood GI consulted DIC panel ordered, s/p 2U PRBCs/1FFP/1 Cryo Fibrinogen undetectable, 2 additional pools of Cryo ordered  Objective:   Blood pressure (!) 83/35, pulse 93, temperature (!) 92.5 F (33.6 C), resp. rate 16, height 5' 11 (1.803 m), weight 61.2 kg, SpO2 100%.    Vent Mode: PRVC FiO2 (%):  [80 %-100 %] 80 % Set Rate:  [18 bmp] 18 bmp Vt Set:  [600 mL] 600 mL PEEP:  [5 cmH20] 5 cmH20 Plateau Pressure:  [23 cmH20] 23 cmH20   Intake/Output Summary (Last 24 hours) at 01/23/2024 0738 Last data filed at 01/23/2024 0600 Gross per 24 hour  Intake 4042.98 ml  Output 1250 ml  Net 2792.98 ml   Filed Weights   01/22/24 0914 01/22/24 2200 01/23/24 0500  Weight: 58.1 kg 56.1 kg 61.2 kg   Physical Examination: General: Acutely ill-appearing older man  in NAD. HEENT: [place]/AT, anicteric sclera, PERRL 2mm, moist mucous membranes. Neuro: Intubated, sedated. Does not respond to verbal, tactile or noxious stimuli. Not following commands. No spontaneous movement of extremities noted on my exam.  CV: RRR, no m/g/r. PULM: Breathing even and unlabored on vent (PEEP 5, FiO2 80%).  Lung fields with coarse crackles on R, clear on L. ETT with minimal pink frothy secretions. GI: Soft, nontender, mildly distended. Hypoactive bowel sounds. OGT with dark brown-red output. Extremities: No significant LE edema noted. New L AKA. Incisional vac in place with scant serosanguinous output. Skin: Warm/dry, no rashes.  Resolved Problem List:    Assessment and Plan:   In-hospital Cardiac Arrest - PEA S/P CPR and ROSC. Elevated troponin - likely secondary to cardiac arrest Concern secondary to hypoglycemia. Underwent CPR x . CT Head  - Targeted temperature management/normothermia protocol - Cardiac monitoring - Trend troponin, Co-ox - F/u Echo - Not a AC candidate due to gross coagulopathy at present  Shock - suspect hemorrhagic/hypovolemic - Goal MAP > 65 - Fluid/volume resuscitation with blood products as indicated - Levophed titrated to goal MAP + vasopressin, Neo off - Trend H&H - Monitor for signs of active bleeding - Transfuse for Hgb < 7.0 or hemodynamically significant bleeding  Upper GI bleed Acute Blood Loss Anemia Elevated LFTs - Shock Liver Coagulopathy-low fibrinogen INR 4.6. S/p 2U PRBCs, 1 FFP, 1 Cryo. CT A/P concerning for shock bowel, diffusely fluid-filled small bowel loops without transition, hepatic periportal edema, small volume ascites, severe atherosclerosis, bulky retained stool in the rectosigmoid colon. - GI consulted, appreciate recommendations - Supportive care for now, no plan for endoscopic intervention until more stable - F/u DIC panel, repeat H&H; 2 additional pools of cryo ordered - Trend LFTs - S/p Vitamin K 5mg  IV x 1  Acute Hypoxic Respiratory Failure Aspiration Pneumonitis S/p bronchoscopy demonstrating aspirated blood. Intubated during code. CTA Chest negative for PE, +extensive bilateral dependent lung consolidation (aspiration vs. PNA), bronchopneumonia of middle lobes, trace pleural effusions. - Continue full vent support  (4-8cc/kg IBW) - Wean FiO2 for O2 sat > 90% - Daily WUA/SBT - VAP bundle - Pulmonary hygiene - PAD protocol for sedation: Precedex and Fentanyl  for goal RASS 0 to -1 - Empiric azithromycin/Rocephin, transitioned to doxycycline to cover for aspiration  Severe Metabolic Acidosis Lactic Acidosis Acute Kidney Injury History of chronic kidney disease stage III, baseline Cr 2.3. - Nephrology consulted, appreciate recommendations - Trend BMP - Replete electrolytes as indicated - Continue bicarb gtt - Monitor I&Os - Avoid nephrotoxic agents as able - Ensure adequate renal perfusion - Hyperglycemia - Insulin gtt started for glucoses persistently >400 - Management per EndoTool - CBGs per EndoTool  H/o PVD s/p Stents in his Femoral Arteries  S/p Left AKA 11/19 ([facility]) - Postoperative management per VVS - Multimodal pain management, currently PAD protocol on vent - Wound care per VVS  Laryngeal Cancer s/p radiation 2024 - Noted history  Critical care time:    The patient is critically ill with multiple organ system failure and requires high complexity decision making for assessment and support, frequent evaluation and titration of therapies, advanced monitoring, review of radiographic studies and interpretation of complex data.   Critical Care Time devoted to patient care services, exclusive of separately billable procedures, described in this note is 39 minutes.  Corean CHRISTELLA Rolando Whitby, PA-C [place] [facility] 01/23/24 12:45 PM  Please see [email or web address] for pager details.  From 7A-7P if no response, please call [phone] After  hours, please call ELink [phone]

## 2024-01-23 NOTE — Progress Notes (Signed)
 Assumed care of patient at 1900.  Wife at bedside at time of initial assessment.  Patient drowsy and fatigued but arousable to voice and following commands.  Initially NSR with HR 90-105 and on room air.  Patient taking small sips of water and oriented x4.  At 2105 CCMD called to notify that patient in severe bradycardia with rate in 30s.  Entered room to find patient unresponsive with minimal respirations.  Code immediately called and compressions started.   Patient transferred to ICU at approximately 2145 with report giving to receiving nurse.

## 2024-01-23 NOTE — Progress Notes (Addendum)
 eLink Physician-Brief Progress Note Patient Name: William Perkins DOB: 09/14/1950 MRN: [id number]   Date of Service  01/23/2024  HPI/Events of Note  Status post left above-knee amputation Sustained cardiorespiratory arrest with CPR lasting about 18 minutes before ROSC  Has been on Endo tool for hyperglycemia-not DKA.  CBGs for the last 2 hours have been in the 90s.  Endo tool is recommending initiation of dextrose  infusion?  Currently on insulin  0.6 units.  eICU Interventions  Typically, the management of hyperglycemia is to withdraw the use of insulin  rather than initiating dextrose  for the management of hyperglycemia.  Severe acidemia would explain the patient's anion gap.  No indication for beta-hydroxybutyrate check or serial metabolic panel akin to DKA protocol.  Minimize insulin  drip per protocol, if he develops hypoglycemia, will address with glucose supplementation.  Continue current care.   2246 - Transition to SSI  0325 -now having hypoglycemic episodes.  Add D10 infusion.  Had hypoxia on saturations but the waveform was poor.  ABG performed with appropriate PaO2-continue current respiratory supplementation.  9547 - Transfuse cryo and ffp  0518 -hyperkalemia protocol, calcium , magnesium , and increase PEEP to 12  0543 -persistent hypoxemia, escalated PEEP to 14, will start epoprostenol , initiate proning protocol.  Will obtain chest radiograph prior to prone ventilation  Intervention Category Minor Interventions: Routine modifications to care plan (e.g. PRN medications for pain, fever)  [name] 01/23/2024, 9:36 PM

## 2024-01-23 NOTE — Progress Notes (Signed)
 eLink Physician-Brief Progress Note Patient Name: William Perkins DOB: 04-29-1950 MRN: [id number]   Date of Service  01/23/2024  HPI/Events of Note  Troponin 1414, he is post CPR but the high numbers suggest possible acute coronary syndrome. Abnormal coagulation parameters and immediate post-operative status preclude Heparinization. Patient has urinary retention.  eICU Interventions  Trend Troponin, echocardiogram, statin, consult cardiology, interval Aspirin , Foley catheter ordered, will hold off on reversing coagulopathy given possible ACS but will re-consider if any evidence of active bleeding.        William Perkins 01/23/2024, 1:53 AM

## 2024-01-23 NOTE — Progress Notes (Signed)
 eLink Physician-Brief Progress Note Patient Name: William Perkins DOB: 12/01/1950 MRN: [id number]   Date of Service  01/23/2024  HPI/Events of Note  Ca++ .99  eICU Interventions  Calcium  gluconate 2 gm iv x 1.        Shandreka Dante U Antoni Stefan 01/23/2024, 2:56 AM

## 2024-01-23 NOTE — Progress Notes (Signed)
   Inpatient Rehabilitation Admissions Coordinator   Rehab consult received postop. Noted events of past 24 hrs. We will follow at a distance.  Heron Leavell, RN, MSN Rehab Admissions Coordinator [phone] 01/23/2024 2:24 PM

## 2024-01-23 NOTE — Progress Notes (Signed)
 PCCM INTERVAL PROGRESS NOTE  Patient in DIC following elective amputation and cardiac arrest. Fibrinogen previously undetectable low and 2 pools of cryo were transfused. Now fibrinogen up to 77. Will transfuse 2 additional pools of cryo.    Deward Eastern, AGACNP-BC [facility]  See Amion for personal pager PCCM on call pager [phone] until 7pm. Please call Elink 7p-7a. [phone]  01/23/2024 7:07 PM

## 2024-01-23 NOTE — Progress Notes (Signed)
 Echocardiogram 2D Echocardiogram has been performed.  Merlynn Argyle 01/23/2024, 10:21 AM

## 2024-01-23 NOTE — Progress Notes (Signed)
 PT Cancellation Note  Patient Details Name: William Perkins MRN: [id number] DOB: 31-Mar-1950   Cancelled Treatment:    Reason Eval/Treat Not Completed: (P) Patient not medically ready  Patient not medically ready. Recent Code with 18 min CPR, low Hemoglobin, low BP, intubated/sedated. OT will sign off at this time and will await re-order from medical team when appropriate.   [name] B. Fleeta Lapidus PT, DPT Acute Rehabilitation Services Please use secure chat or  Call Office [phone]  [name] [facility] 01/23/2024, 10:16 AM

## 2024-01-23 NOTE — Progress Notes (Signed)
 eLink Physician-Brief Progress Note Patient Name: William Perkins DOB: 07-04-1950 MRN: [id number]   Date of Service  01/23/2024  HPI/Events of Note  Hypotension.  eICU Interventions  2 amps of Bicarb + Bicarb gtt increased to 150 ml / hour, Ceiling on Levophed  increased to 60 and Phenylephrine  added to the [facility] in case it is needed, blood product administration will be expedited. BP has improved with outlined measures.        [name] [name] 01/23/2024, 2:43 AM

## 2024-01-23 NOTE — Progress Notes (Signed)
 Pt transported on ventilator to CT and back w/o complication.

## 2024-01-23 NOTE — Procedures (Signed)
 Bronchoscopy Procedure Note  William Perkins  [id number]  12-17-1950  Date:01/23/24  Time:12:22 AM   Provider Performing:William Perkins   Procedure(s):  Flexible Bronchoscopy [phone])  Indication(s) Aspiration, possible hemoptysis  Consent Risks of the procedure as well as the alternatives and risks of each were explained to the patient and/or caregiver.  Consent for the procedure was obtained and is signed in the bedside chart  Anesthesia Sedated on dex and fentanyl    Time Out Verified patient identification, verified procedure, site/side was marked, verified correct patient position, special equipment/implants available, medications/allergies/relevant history reviewed, required imaging and test results available.  Sterile Technique Usual hand hygiene, masks, gowns, and gloves were used  Procedure Description Bronchoscope advanced through endotracheal tube and into airway.  Airways were examined down to subsegmental level with findings noted below.    Findings: Diffuse blood throughout airways, R>L and pulmonary edema without active bleeding.    Complications/Tolerance None; patient tolerated the procedure well. Chest X-ray is not needed post procedure.   EBL Minimal   Specimen(s) N/a   William LOISE Blush, PA-C  Pulmonary & Critical Care 01/23/24 12:23 AM  Please see [email or web address] for pager details.  From 7A-7P if no response, please call [phone] After hours, please call ELink [phone]

## 2024-01-23 NOTE — Progress Notes (Addendum)
  Progress Note    01/23/2024 7:06 AM 1 Day Post-Op  Subjective:  intubated   Vitals:   01/23/24 0644 01/23/24 0645  BP:    Pulse: 94 94  Resp: 15 16  Temp: (!) 91.8 F (33.2 C) (!) 91.4 F (33 C)  SpO2: 100% 100%    Physical Exam: Incisions:  left AKA dressing is clean.  No drainage in vac.     CBC    Component Value Date/Time   WBC 12.4 (H) 01/23/2024 0003   RBC 2.03 (L) 01/23/2024 0003   HGB 8.5 (L) 01/23/2024 0630   HGB 11.7 (L) 10/16/2018 1003   HCT 25.0 (L) 01/23/2024 0630   HCT 37.0 (L) 10/16/2018 1003   PLT 167 01/23/2024 0003   PLT 240 10/16/2018 1003   MCV 103.0 (H) 01/23/2024 0003   MCV 91 10/16/2018 1003   MCH 30.5 01/23/2024 0003   MCHC 29.7 (L) 01/23/2024 0003   RDW 14.1 01/23/2024 0003   RDW 13.1 10/16/2018 1003   LYMPHSABS 1.2 11/19/2023 1631   LYMPHSABS 1.4 12/13/2017 1335   MONOABS 0.6 11/19/2023 1631   EOSABS 0.2 11/19/2023 1631   EOSABS 0.4 12/13/2017 1335   BASOSABS 0.1 11/19/2023 1631   BASOSABS 0.1 12/13/2017 1335    BMET    Component Value Date/Time   NA 136 01/23/2024 0630   NA 135 09/16/2023 1425   K 4.4 01/23/2024 0630   CL 99 01/23/2024 0003   CO2 8 (L) 01/23/2024 0003   GLUCOSE 356 (H) 01/23/2024 0003   BUN 32 (H) 01/23/2024 0003   BUN 40 (H) 09/16/2023 1425   CREATININE 3.24 (H) 01/23/2024 0003   CREATININE 1.78 (H) 06/24/2014 1640   CALCIUM  8.4 (L) 01/23/2024 0003   GFRNONAA 19 (L) 01/23/2024 0003   GFRNONAA 43 (L) 01/07/2014 1353   GFRAA 43 (L) 04/07/2020 1409   GFRAA 50 (L) 01/07/2014 1353    INR    Component Value Date/Time   INR 4.6 (HH) 01/23/2024 0039     Intake/Output Summary (Last 24 hours) at 01/23/2024 0706 Last data filed at 01/23/2024 0600 Gross per 24 hour  Intake 4042.98 ml  Output 1250 ml  Net 2792.98 ml     Assessment/Plan:  73 y.o. male is s/p left above knee amputation   1 Day Post-Op   -events overnight noted.  Appreciate critical care team. -incisional vac on left AKA.   Dressing is clean and dry.  Plan to keep in place for one week.   -acute blood loss anemia-received 2 PRBC's, 2 FFP and one cryo   Gricelda Foland, PA-C [facility] [phone] 01/23/2024 7:06 AM

## 2024-01-23 NOTE — Progress Notes (Signed)
   01/23/24 0752  Spiritual Encounters  Type of Visit Initial  Care provided to: Family  Referral source Trauma page  Reason for visit Routine spiritual support  OnCall Visit Yes  Spiritual Framework  Presenting Themes Impactful experiences and emotions  Community/Connection Family  Patient Stress Factors Major life changes  Family Stress Factors Exhausted  Interventions  Spiritual Care Interventions Made Compassionate presence;Established relationship of care and support  Intervention Outcomes  Outcomes Awareness of support;Awareness of health   Chaplain respond to a rapid response. Upon arrival, the medical team was actively assessing and treating the Pt. Pt's wife was present and was escorted with the Pt as he was moved to ICU.    Once the Pt was stabilized and situated, chaplain remained present, offering emotional and spiritual support to the spouse and family member during this critical moment.

## 2024-01-23 NOTE — Consult Note (Addendum)
 Consultation  Referring Provider: Magda Debby SAILOR, MD Primary Care Physician:  Rosan Dayton BROCKS, DO Primary Gastroenterologist: Dr. Abran  Reason for Consultation: Hematemesis  HPI: William Perkins is a 73 y.o. male with history of peripheral vascular disease, diabetes mellitus laryngeal cancer for which he completed radiation therapy in February 2025, peripheral arterial disease status post recent left femoral endarterectomy, who continued to have extremity pain with rest, and subsequently underwent left AKA yesterday morning. Last evening he had a cardiac arrest/PEA and 18 minutes of CPR. Apparently at the time of the arrest he did have an episode of vomiting blood, just prior to being intubated. He underwent bronchoscopy late last evening that showed diffuse blood with no active bleeding and it was felt that this was actually hematemesis that he had aspirated.  Bedside ultrasound did not show any evidence of pericardial effusion  Etiology of shock not clear, possibly hemorrhagic, also with an aspiration pneumonitis, severe metabolic acidosis and coagulopathy with INR of 4.6  Intubated and nonresponsive at the time of exam OG is putting out small amounts of dark red blood there is about 500 cc in the container but that had been the entire amount for overnight  No prior history of GI bleeding  His preop hemoglobin yesterday was 8.3/hemoglobin last night 6.5> 6.2 early a.m. He has been transfused, FFP, cryo given  WBC 7.9/hemoglobin 8.7/hematocrit 28.5/platelets 99 Troponin 11,000 Glucose 506/BUN 35/creatinine 3.4 T. bili 1.8/alk phos 243/AST 05/06/1934/ALT 3913 alk phos 243 BUN 35/creatinine 3.4 Lactate greater than 9 Fibrinogen less than 60/D-dimer greater than 20 Pro time 42.1/INR 4.2   CT abdomen pelvis with contrast diffuse severe calcified atherosclerosis with very severe atherosclerotic stenosis of the celiac artery and SMA similar changes in the right main renal artery.   There is shock bowel appearance of diffusely fluid-filled small bowel loops with no abrupt transition, evidence of hepatic periportal edema, small volume ascites, diffuse gallbladder edema also in keeping with shock, bulky stool in the rectosigmoid CTA chest no evidence of PE-extensive bilateral dependent lung consolidation such as aspiration or pneumonia, multiple anterior rib fractures  Colonoscopy had been done 2019-15 mm polyp in the rectum, 9 other polyps removed and follow-up in 1 year recommended. No EGD  Past Medical History:  Diagnosis Date   Cancer (HCC) 02/2023   laryngeal cancer   Chronic renal insufficiency 03/21/2006   CrCl <50 ml   DDD (degenerative disc disease), cervical    GERD 03/22/2006   Qualifier: Diagnosis of  By: [name]     GERD (gastroesophageal reflux disease) 03/22/2006   Hand numbness 10/07/2009   pt states not any longer   Hyperlipidemia 03/22/2008   Hypertension    Hypertension 03/22/2006   Left ventricular hypertrophy 03/22/2006   Leg pain, left 11/15/2009   Lumbar strain 07/15/2008   Renal insufficiency    Soft tissue disorder 08/2008   Tobacco abuse 03/22/2006   2 ppd x 30 years    Past Surgical History:  Procedure Laterality Date   ABDOMINAL AORTOGRAM N/A 12/06/2023   Procedure: ABDOMINAL AORTOGRAM;  Surgeon: Magda Debby SAILOR, MD;  Location: [facility];  Service: Cardiovascular;  Laterality: N/A;   ABDOMINAL AORTOGRAM W/LOWER EXTREMITY N/A 05/18/2020   Procedure: ABDOMINAL AORTOGRAM W/LOWER EXTREMITY;  Surgeon: Magda Debby SAILOR, MD;  Location: [facility];  Service: Cardiovascular;  Laterality: N/A;   ABDOMINAL AORTOGRAM W/LOWER EXTREMITY N/A 01/17/2024   Procedure: ABDOMINAL AORTOGRAM W/LOWER EXTREMITY;  Surgeon: Magda Debby SAILOR, MD;  Location: [facility];  Service: Cardiovascular;  Laterality: N/A;   AMPUTATION Left 01/22/2024   Procedure: LEFT ABOVE KNEE AMPUTATION;  Surgeon: Magda Debby SAILOR, MD;   Location: [facility] OR;  Service: Vascular;  Laterality: Left;   BACK SURGERY  2020   PLIF   COLONOSCOPY     DIRECT LARYNGOSCOPY N/A 01/24/2023   Procedure: DIRECT LARYNGOSCOPY;  Surgeon: Tobie Eldora NOVAK, MD;  Location: [facility] OR;  Service: ENT;  Laterality: N/A;  DIRECT LARYNGOSCOPY WITH BIOPSY OF VOCAL CORD LESION   ENDARTERECTOMY FEMORAL Right 05/20/2020   Procedure: RIGHT EXTENDED FEMORAL ENDARTERECTOMY AND PROFUNDAPLASTY;  Surgeon: Magda Debby SAILOR, MD;  Location: [facility] OR;  Service: Vascular;  Laterality: Right;   ENDARTERECTOMY FEMORAL Left 12/11/2023   Procedure: ENDARTERECTOMY, FEMORAL;  Surgeon: Magda Debby SAILOR, MD;  Location: [facility] OR;  Service: Vascular;  Laterality: Left;   LOWER EXTREMITY ANGIOGRAPHY N/A 12/06/2023   Procedure: Lower Extremity Angiography;  Surgeon: Magda Debby SAILOR, MD;  Location: [facility];  Service: Cardiovascular;  Laterality: N/A;   LOWER EXTREMITY ANGIOGRAPHY N/A 01/17/2024   Procedure: Lower Extremity Angiography;  Surgeon: Magda Debby SAILOR, MD;  Location: [facility] INVASIVE CV LAB;  Service: Cardiovascular;  Laterality: N/A;   WISDOM TOOTH EXTRACTION      Prior to Admission medications   Medication Sig Start Date End Date Taking? Authorizing Provider  acetaminophen  (TYLENOL ) 500 MG tablet Take 1,000 mg by mouth every 6 (six) hours as needed for mild pain or moderate pain.   Yes [provider]  amLODipine  (NORVASC ) 10 MG tablet TAKE 1 TABLET(10 MG) BY MOUTH DAILY 12/16/23  Yes Rosan Dayton BROCKS, DO  aspirin  EC 81 MG tablet Take 81 mg by mouth daily. Swallow whole.   Yes [provider]  atorvastatin  (LIPITOR) 80 MG tablet Take 1 tablet (80 mg total) by mouth daily. 10/07/23  Yes Rosan Dayton BROCKS, DO  cloNIDine  (CATAPRES ) 0.1 MG tablet Take 1 tablet (0.1 mg total) by mouth 2 (two) times daily. 08/17/21 01/22/24 Yes Fernand Prost, MD  HYDROcodone -acetaminophen  (NORCO/VICODIN) 5-325 MG tablet Take 1 tablet by mouth every 6 (six) hours as needed for moderate pain  (pain score 4-6). 01/14/24  Yes Magda Debby SAILOR, MD  lisinopril  (ZESTRIL ) 40 MG tablet Take 1 tablet (40 mg total) by mouth daily. 12/16/23  Yes Rosan Dayton BROCKS, DO  polyethylene glycol (MIRALAX  / GLYCOLAX ) 17 g packet Take 17 g by mouth 2 (two) times daily. Patient taking differently: Take 17 g by mouth daily as needed for mild constipation or moderate constipation. 10/16/18  Yes Barbaraann Katz, MD  sodium bicarbonate  650 MG tablet Take 2 tablets (1,300 mg total) by mouth 2 (two) times daily. 01/09/24 01/08/25 Yes Rosan Dayton BROCKS, DO  lidocaine  (XYLOCAINE ) 2 % solution Patient: Mix 1part 2% viscous lidocaine , 1part H20. Swallow 10mL of diluted mixture, before meals and at bedtime, up to QID Patient not taking: Reported on 01/20/2024 03/12/23   Izell Domino, MD    Current Facility-Administered Medications  Medication Dose Route Frequency Provider Last Rate Last Admin   0.9 %  sodium chloride  infusion (Manually program via Guardrails IV Fluids)   Intravenous Once Tanda, Tara N, PA-C       0.9 %  sodium chloride  infusion (Manually program via Guardrails IV Fluids)   Intravenous Once Wilson, Tara N, PA-C       0.9 %  sodium chloride  infusion (Manually program via Guardrails IV Fluids)   Intravenous Once Ogan, Okoronkwo U, MD  0.9 %  sodium chloride  infusion (Manually program via Guardrails IV Fluids)   Intravenous Once Ogan, Okoronkwo U, MD       0.9 %  sodium chloride  infusion  250 mL Intravenous Continuous Gatha Pence, MD 10 mL/hr at 01/23/24 1000 Infusion Verify at 01/23/24 1000   0.9 %  sodium chloride  infusion   Intravenous PRN Gatha Pence, MD       calcium  gluconate 4 g in sodium chloride  0.9 % 100 mL IVPB  4 g Intravenous Once Ilah, Stephanie M, PA-C       cefTRIAXone  (ROCEPHIN ) 2 g in sodium chloride  0.9 % 100 mL IVPB  2 g Intravenous Q24H Olalere, Adewale A, MD       Chlorhexidine  Gluconate Cloth 2 % PADS 6 each  6 each Topical Daily Mohammed, Shahid, MD        dexmedetomidine  (PRECEDEX ) 400 MCG/100ML (4 mcg/mL) infusion  0-1.2 mcg/kg/hr Intravenous Titrated Gatha Pence, MD 8.72 mL/hr at 01/23/24 1045 0.6 mcg/kg/hr at 01/23/24 1045   docusate (COLACE) 50 MG/5ML liquid 100 mg  100 mg Per Tube BID PRN Gatha Pence, MD       fentaNYL  (SUBLIMAZE ) bolus via infusion 25-100 mcg  25-100 mcg Intravenous Q15 min PRN Ilah Krabbe M, PA-C       fentaNYL  (SUBLIMAZE ) injection 50 mcg  50 mcg Intravenous Q15 min PRN Ilah Krabbe M, PA-C       fentaNYL  in NS (60mcg/ml) infusion-PREMIX  0-400 mcg/hr Intravenous Continuous Mohammed, Shahid, MD 7.5 mL/hr at 01/23/24 1000 75 mcg/hr at 01/23/24 1000   insulin  regular, human (MYXREDLIN ) 100 units/ 100 mL infusion   Intravenous Continuous Ilah Krabbe M, PA-C 5.5 mL/hr at 01/23/24 1000 5.5 Units/hr at 01/23/24 1000   norepinephrine  (LEVOPHED ) 16 mg in 250mL (0.064 mg/mL) premix infusion  0-60 mcg/min Intravenous Titrated Laron Agent, RPH 40.3 mL/hr at 01/23/24 1045 43 mcg/min at 01/23/24 1045   ondansetron  (ZOFRAN ) injection 4 mg  4 mg Intravenous Q6H PRN Rhyne, Samantha J, PA-C       Oral care mouth rinse  15 mL Mouth Rinse Q2H Ogan, Okoronkwo U, MD   15 mL at 01/23/24 0945   Oral care mouth rinse  15 mL Mouth Rinse PRN Ogan, Okoronkwo U, MD       pantoprazole  (PROTONIX ) injection 40 mg  40 mg Intravenous Q12H Olalere, Adewale A, MD       perflutren  lipid microspheres (DEFINITY ) IV suspension  1-10 mL Intravenous PRN Gatha Pence, MD   3 mL at 01/23/24 1021   phenol (CHLORASEPTIC) mouth spray 1 spray  1 spray Mouth/Throat PRN Rhyne, Samantha J, PA-C       phenylephrine  (NEO-SYNEPHRINE) 20mg /NS 250mL premix infusion  0-400 mcg/min Intravenous Titrated Ogan, Okoronkwo U, MD   Stopped at 01/23/24 9355   phytonadione  (VITAMIN K ) 5 mg in dextrose  5 % 50 mL IVPB  5 mg Intravenous STAT Ilah Krabbe M, PA-C       polyethylene glycol (MIRALAX  / GLYCOLAX ) packet 17 g  17 g Per Tube Daily PRN  Gatha Pence, MD       sodium bicarbonate  1 mEq/mL injection            sodium bicarbonate  150 mEq in sterile water  1,150 mL infusion   Intravenous Continuous Ogan, Okoronkwo U, MD 150 mL/hr at 01/23/24 1000 Infusion Verify at 01/23/24 1000   vasopressin  (PITRESSIN) 20 Units in 100 mL (0.2 unit/mL) infusion-*FOR SHOCK*  0.04 Units/min Intravenous Continuous Gatha Pence, MD 12 mL/hr  at 01/23/24 1000 0.04 Units/min at 01/23/24 1000    Allergies as of 01/20/2024   (No Known Allergies)    Family History  Problem Relation Age of Onset   Diabetes Mother    Diabetes Sister    Diabetes Brother    Heart disease Brother    Rectal cancer Neg Hx    Stomach cancer Neg Hx    Colon cancer Neg Hx     Social History   Socioeconomic History   Marital status: Married    Spouse name: Anette   Number of children: 0   Years of education: 12   Highest education level: Not on file  Occupational History   Occupation: Retired  Tobacco Use   Smoking status: Former    Current packs/day: 0.00    Average packs/day: 1 pack/day for 44.0 years (44.0 ttl pk-yrs)    Types: Cigarettes    Start date: 11/21/1969    Quit date: 11/21/2013    Years since quitting: 10.1   Smokeless tobacco: Never   Tobacco comments:    Stopped 11/21/2013   Vaping Use   Vaping status: Never Used  Substance and Sexual Activity   Alcohol use: No    Alcohol/week: 0.0 standard drinks of alcohol    Comment: Quit 2015   Drug use: No   Sexual activity: Yes    Partners: Female  Other Topics Concern   Not on file  Social History Narrative   Not on file   Social Drivers of Health   Financial Resource Strain: Low Risk  (11/27/2023)   Overall Financial Resource Strain (CARDIA)    Difficulty of Paying Living Expenses: Not hard at all  Food Insecurity: No Food Insecurity (12/13/2023)   Hunger Vital Sign    Worried About Running Out of Food in the Last Year: Never true    Ran Out of Food in the Last Year: Never true   Transportation Needs: No Transportation Needs (12/13/2023)   PRAPARE - Administrator, Civil Service (Medical): No    Lack of Transportation (Non-Medical): No  Physical Activity: Insufficiently Active (11/27/2023)   Exercise Vital Sign    Days of Exercise per Week: 7 days    Minutes of Exercise per Session: 20 min  Stress: No Stress Concern Present (11/27/2023)   [employer] of Occupational Health - Occupational Stress Questionnaire    Feeling of Stress: Not at all  Social Connections: Moderately Isolated (11/27/2023)   Social Connection and Isolation Panel    Frequency of Communication with Friends and Family: Once a week    Frequency of Social Gatherings with Friends and Family: Three times a week    Attends Religious Services: Never    Active Member of Clubs or Organizations: No    Attends Banker Meetings: Never    Marital Status: Married  Catering Manager Violence: Not At Risk (12/13/2023)   Humiliation, Afraid, Rape, and Kick questionnaire    Fear of Current or Ex-Partner: No    Emotionally Abused: No    Physically Abused: No    Sexually Abused: No    Review of Systems: Pt unable to offer  Physical Exam: Vital signs in last 24 hours: Temp:  [83.8 F (28.8 C)-98.2 F (36.8 C)] 95.2 F (35.1 C) (11/20 1115) Pulse Rate:  [69-102] 87 (11/20 1115) Resp:  [10-29] 14 (11/20 1115) BP: (64-115)/(35-74) 83/35 (11/20 0221) SpO2:  [92 %-100 %] 96 % (11/20 1115) Arterial Line BP: (77-197)/(33-56) 126/48 (11/20 1115) FiO2 (%):  [  70 %-100 %] 70 % (11/20 1030) Weight:  [56.1 kg-61.2 kg] 61.2 kg (11/20 0500) Last BM Date : 01/22/24 General: Well-developed, older African-American male, intubated, clear heme -unresponsive Head:  Normocephalic and atraumatic. Eyes:  Sclera clear, no icterus.   Conjunctiva pale Ears:  Normal auditory acuity. Nose:  No deformity, discharge,  or lesions. Mouth:  No deformity or lesions.  OG with dark red blood about 100 cc  this morning Neck:  Supple; no masses or thyromegaly. Lungs: Coarse breath sounds bilaterally  Heart:  Regular rate and rhythm; no murmurs, clicks, rubs,  or gallops. Abdomen:  Soft, bowel sounds quiet ,nonpalp mass or hsm.   Rectal: Not done, no bowel movement since admit Msk:  Symmetrical without gross deformities. . Pulses:  Normal pulses noted. Extremities: Left AKA Neurologic: Intubated, nonresponsive Skin:  Intact without significant lesions or rashes..   Intake/Output from previous day: 11/19 0701 - 11/20 0700 In: 4043 [I.V.:1878; Blood:1116; IV Piggyback:949] Out: 1250 [Urine:725; Emesis/NG output:425; Blood:100] Intake/Output this shift: Total I/O In: 1152.3 [I.V.:1001.4; IV Piggyback:150.9] Out: -   Lab Results: Recent Labs    01/22/24 1614 01/22/24 2336 01/23/24 0003 01/23/24 0209 01/23/24 0630 01/23/24 0632 01/23/24 0958  WBC 8.8  --  12.4*  --   --  7.9  --   HGB 7.4*   < > 6.2*   < > 8.5* 8.7* 8.5*  HCT 23.1*   < > 20.9*   < > 25.0* 28.5* 25.0*  PLT 286  --  167  --   --  99*  --    < > = values in this interval not displayed.   BMET Recent Labs    01/22/24 1614 01/22/24 2336 01/23/24 0003 01/23/24 0209 01/23/24 0630 01/23/24 0632 01/23/24 0958  NA 140   < > 140   < > 136 140 139  K 4.0   < > 4.9   < > 4.4 4.5 4.0  CL 103  --  99  --   --  93*  --   CO2 19*  --  8*  --   --  10*  --   GLUCOSE 85  --  356*  --   --  506*  --   BUN 25*  --  32*  --   --  35*  --   CREATININE 2.22*  --  3.24*  --   --  3.40*  --   CALCIUM  8.4*  --  8.4*  --   --  8.2*  --    < > = values in this interval not displayed.   LFT Recent Labs    01/23/24 0632  PROT 4.3*  ALBUMIN 2.0*  AST 3,336*  ALT 3,913*  ALKPHOS 243*  BILITOT 1.8*   PT/INR Recent Labs    01/23/24 0039 01/23/24 0632  LABPROT 45.6* 42.1*  INR 4.6* 4.2*    IMPRESSION:  #51   73 year old African-American male status post left AKA yesterday morning, course complicated by PEA arrest  last night, prolonged CPR  Now with multiorgan failure in setting of profound shock  #2 acute kidney injury secondary to above on chronic kidney disease #3 shock liver #4 significant coagulopathy-parameters worrisome for DIC #5 hematemesis just prior to intubation with CPR-he is having evidence of ongoing upper GI blood loss though low-grade at present via OG  Etiology of the hematemesis not clear rule out Mallory-Weiss tear, peptic ulcer disease,  #6 history of laryngeal cancer status post radiation therapy #7  severe peripheral vascular disease including severe mesenteric disease  #8 imaging concerning for shock bowel #9 bilateral pulmonary infiltrates #10 diabetes mellitus #11 history of multiple adenomatous colon polyps-overdue for colonoscopy  Plan-patient is critically ill with high risk of mortality this admission Management of GI bleeding at this time is supportive in setting of DIC/significant coagulopathy. Transfuse as indicated Continue to correct coagulopathy Twice daily IV PPI Do not plan endoscopic intervention at this time, can be considered when he is more stable and less coagulopathic Consider addition of NAC setting of shock liver    Amy EsterwoodPA-C  01/23/2024, 11:50 AM    Attending physician's note   I have taken a history, reviewed the chart, and examined the patient. I performed a substantive portion of this encounter, including complete performance of at least one of the key components, in conjunction with the APP. I agree with the APP's note, impression, and recommendations with my edits.  73 year old male s/p left AKA yesterday morning with subsequent PEA arrest last evening, now in the ICU for postcardiac arrest care.  Noted to have bloody emesis at the time of the arrest  with clinical suspicion for hematemesis with aspiration.  Patient currently intubated, sedated.  Discussed his care with family members at bedside.  1) Shock liver 2) PEA  arrest 3) Multiorgan failure 4) Coagulopathy 5) Lactic acidosis 6) Acute on chronic anemia 7) Suspected hematemesis with blood in OGT 8) Acute hypoxic respiratory failure Not entirely clear what the source of his hematemesis was, but now critically ill with multiorgan failure after PEA arrest.  While he may have had MWT, PUD, etc. as an acute bleed, with his profound coagulopathy, diffuse mucosal close certainly possible as well.  Additionally with shock liver contributing to coagulopathy. - CBC trend with additional blood products per protocol - Maximum support per CCS primary - Nephrology service consulted for AKI on CKD with likely ATN in the setting of shock - Trend liver enzymes.  While this is not primary ALF, may benefit from NAC given clinical/serologic features of liver failure - Far too unstable for endoscopic evaluation - Inpatient GI service will continue to follow  A total of 75 minutes of time was spent on this encounter, including in depth chart review, independent review of results as outlined above, communicating results with the patient directly, face-to-face time with the patient, coordinating care, and ordering studies and medications as appropriate, and documentation.   [address], DO, FACG [phone] office

## 2024-01-23 NOTE — Progress Notes (Signed)
 eLink Physician-Brief Progress Note Patient Name: William Perkins DOB: [birth year] MRN: [id number]   Date of Service  01/23/2024  HPI/Events of Note  Patient with blood in the OG and ET tubes, hemoglobin 6.2 gm / dl and blood pressure soft, two units of PRBC pending transfusion.  eICU Interventions  2 units of FFP and a Cryoprecipitate pool ordered transfused for post-op coagulopathy with bleeding.        [name] 01/23/2024, 2:07 AM

## 2024-01-23 NOTE — Progress Notes (Signed)
 Okay to inject contrast. Benefits outweigh risks.  Tamela Stakes, MD  Attending Physician, Critical Care Medicine [facility] See Amion for pager If no response to pager, please call [phone] until 7pm After 7pm, Please call E-link [phone]

## 2024-01-24 ENCOUNTER — Inpatient Hospital Stay (HOSPITAL_COMMUNITY)

## 2024-01-24 ENCOUNTER — Ambulatory Visit (INDEPENDENT_AMBULATORY_CARE_PROVIDER_SITE_OTHER)

## 2024-01-24 DIAGNOSIS — R739 Hyperglycemia, unspecified: Secondary | ICD-10-CM

## 2024-01-24 DIAGNOSIS — I468 Cardiac arrest due to other underlying condition: Secondary | ICD-10-CM

## 2024-01-24 DIAGNOSIS — J80 Acute respiratory distress syndrome: Secondary | ICD-10-CM

## 2024-01-24 LAB — BPAM FFP
Blood Product Expiration Date: 202511252359
Blood Product Expiration Date: 202511252359
Blood Product Expiration Date: 202511232359
ISSUE DATE / TIME: 202511200252
ISSUE DATE / TIME: 202511200252
Unit Type and Rh: 7300
Unit Type and Rh: 7300
Unit Type and Rh: 1700

## 2024-01-24 LAB — POCT I-STAT 7, (LYTES, BLD GAS, ICA,H+H)
Acid-Base Excess: 7 mmol/L — ABNORMAL HIGH (ref 0.0–2.0)
Acid-Base Excess: 2 mmol/L (ref 0.0–2.0)
Acid-Base Excess: 2 mmol/L (ref 0.0–2.0)
Acid-Base Excess: 1 mmol/L (ref 0.0–2.0)
Acid-Base Excess: 1 mmol/L (ref 0.0–2.0)
Acid-base deficit: 2 mmol/L (ref 0.0–2.0)
Acid-base deficit: 3 mmol/L — ABNORMAL HIGH (ref 0.0–2.0)
Bicarbonate: 31.9 mmol/L — ABNORMAL HIGH (ref 20.0–28.0)
Bicarbonate: 26.4 mmol/L (ref 20.0–28.0)
Bicarbonate: 26.6 mmol/L (ref 20.0–28.0)
Bicarbonate: 24.2 mmol/L (ref 20.0–28.0)
Bicarbonate: 23.2 mmol/L (ref 20.0–28.0)
Bicarbonate: 21.2 mmol/L (ref 20.0–28.0)
Bicarbonate: 20 mmol/L (ref 20.0–28.0)
Calcium, Ion: 0.87 mmol/L — CL (ref 1.15–1.40)
Calcium, Ion: 0.81 mmol/L — CL (ref 1.15–1.40)
Calcium, Ion: 0.82 mmol/L — CL (ref 1.15–1.40)
Calcium, Ion: 0.86 mmol/L — CL (ref 1.15–1.40)
Calcium, Ion: 0.97 mmol/L — ABNORMAL LOW (ref 1.15–1.40)
Calcium, Ion: 1.01 mmol/L — ABNORMAL LOW (ref 1.15–1.40)
Calcium, Ion: 0.95 mmol/L — ABNORMAL LOW (ref 1.15–1.40)
HCT: 22 % — ABNORMAL LOW (ref 39.0–52.0)
HCT: 20 % — ABNORMAL LOW (ref 39.0–52.0)
HCT: 20 % — ABNORMAL LOW (ref 39.0–52.0)
HCT: 20 % — ABNORMAL LOW (ref 39.0–52.0)
HCT: 24 % — ABNORMAL LOW (ref 39.0–52.0)
HCT: 24 % — ABNORMAL LOW (ref 39.0–52.0)
HCT: 21 % — ABNORMAL LOW (ref 39.0–52.0)
Hemoglobin: 7.5 g/dL — ABNORMAL LOW (ref 13.0–17.0)
Hemoglobin: 6.8 g/dL — CL (ref 13.0–17.0)
Hemoglobin: 6.8 g/dL — CL (ref 13.0–17.0)
Hemoglobin: 6.8 g/dL — CL (ref 13.0–17.0)
Hemoglobin: 8.2 g/dL — ABNORMAL LOW (ref 13.0–17.0)
Hemoglobin: 8.2 g/dL — ABNORMAL LOW (ref 13.0–17.0)
Hemoglobin: 7.1 g/dL — ABNORMAL LOW (ref 13.0–17.0)
O2 Saturation: 99 %
O2 Saturation: 83 %
O2 Saturation: 85 %
O2 Saturation: 98 %
O2 Saturation: 100 %
O2 Saturation: 100 %
O2 Saturation: 100 %
Patient temperature: 37
Patient temperature: 36.4
Patient temperature: 36.4
Patient temperature: 35.8
Patient temperature: 37.1
Patient temperature: 37
Patient temperature: 36.7
Potassium: 5.5 mmol/L — ABNORMAL HIGH (ref 3.5–5.1)
Potassium: 6.3 mmol/L (ref 3.5–5.1)
Potassium: 6.4 mmol/L (ref 3.5–5.1)
Potassium: 5.4 mmol/L — ABNORMAL HIGH (ref 3.5–5.1)
Potassium: 5 mmol/L (ref 3.5–5.1)
Potassium: 4.9 mmol/L (ref 3.5–5.1)
Potassium: 5.3 mmol/L — ABNORMAL HIGH (ref 3.5–5.1)
Sodium: 136 mmol/L (ref 135–145)
Sodium: 133 mmol/L — ABNORMAL LOW (ref 135–145)
Sodium: 134 mmol/L — ABNORMAL LOW (ref 135–145)
Sodium: 133 mmol/L — ABNORMAL LOW (ref 135–145)
Sodium: 131 mmol/L — ABNORMAL LOW (ref 135–145)
Sodium: 133 mmol/L — ABNORMAL LOW (ref 135–145)
Sodium: 132 mmol/L — ABNORMAL LOW (ref 135–145)
TCO2: 33 mmol/L — ABNORMAL HIGH (ref 22–32)
TCO2: 28 mmol/L (ref 22–32)
TCO2: 28 mmol/L (ref 22–32)
TCO2: 25 mmol/L (ref 22–32)
TCO2: 24 mmol/L (ref 22–32)
TCO2: 22 mmol/L (ref 22–32)
TCO2: 21 mmol/L — ABNORMAL LOW (ref 22–32)
pCO2 arterial: 46.2 mmHg (ref 32–48)
pCO2 arterial: 40 mmHg (ref 32–48)
pCO2 arterial: 41.7 mmHg (ref 32–48)
pCO2 arterial: 31.3 mmHg — ABNORMAL LOW (ref 32–48)
pCO2 arterial: 27.2 mmHg — ABNORMAL LOW (ref 32–48)
pCO2 arterial: 29.7 mmHg — ABNORMAL LOW (ref 32–48)
pCO2 arterial: 26.6 mmHg — ABNORMAL LOW (ref 32–48)
pH, Arterial: 7.447 (ref 7.35–7.45)
pH, Arterial: 7.41 (ref 7.35–7.45)
pH, Arterial: 7.424 (ref 7.35–7.45)
pH, Arterial: 7.492 — ABNORMAL HIGH (ref 7.35–7.45)
pH, Arterial: 7.54 — ABNORMAL HIGH (ref 7.35–7.45)
pH, Arterial: 7.463 — ABNORMAL HIGH (ref 7.35–7.45)
pH, Arterial: 7.483 — ABNORMAL HIGH (ref 7.35–7.45)
pO2, Arterial: 155 mmHg — ABNORMAL HIGH (ref 83–108)
pO2, Arterial: 45 mmHg — ABNORMAL LOW (ref 83–108)
pO2, Arterial: 47 mmHg — ABNORMAL LOW (ref 83–108)
pO2, Arterial: 97 mmHg (ref 83–108)
pO2, Arterial: 320 mmHg — ABNORMAL HIGH (ref 83–108)
pO2, Arterial: 328 mmHg — ABNORMAL HIGH (ref 83–108)
pO2, Arterial: 286 mmHg — ABNORMAL HIGH (ref 83–108)

## 2024-01-24 LAB — DIC (DISSEMINATED INTRAVASCULAR COAGULATION)PANEL
D-Dimer, Quant: >20 ug{FEU}/mL — ABNORMAL HIGH (ref 0.00–0.50)
D-Dimer, Quant: >20 ug{FEU}/mL — ABNORMAL HIGH (ref 0.00–0.50)
D-Dimer, Quant: >20 ug{FEU}/mL — ABNORMAL HIGH (ref 0.00–0.50)
Fibrinogen: 82 mg/dL — CL (ref 210–475)
Fibrinogen: 131 mg/dL — ABNORMAL LOW (ref 210–475)
Fibrinogen: 190 mg/dL — ABNORMAL LOW (ref 210–475)
INR: 3.7 — ABNORMAL HIGH (ref 0.8–1.2)
INR: 2.7 — ABNORMAL HIGH (ref 0.8–1.2)
INR: 2.8 — ABNORMAL HIGH (ref 0.8–1.2)
Platelets: 71 K/uL — ABNORMAL LOW (ref 150–400)
Platelets: 57 K/uL — ABNORMAL LOW (ref 150–400)
Platelets: 75 K/uL — ABNORMAL LOW (ref 150–400)
Prothrombin Time: 38 s — ABNORMAL HIGH (ref 11.4–15.2)
Prothrombin Time: 30 s — ABNORMAL HIGH (ref 11.4–15.2)
Prothrombin Time: 30.5 s — ABNORMAL HIGH (ref 11.4–15.2)
Smear Review: NONE SEEN
aPTT: 64 s — ABNORMAL HIGH (ref 24–36)
aPTT: 60 s — ABNORMAL HIGH (ref 24–36)
aPTT: 56 s — ABNORMAL HIGH (ref 24–36)

## 2024-01-24 LAB — CBC WITH DIFFERENTIAL/PLATELET
Abs Immature Granulocytes: 1.03 K/uL — ABNORMAL HIGH (ref 0.00–0.07)
Basophils Absolute: 0 K/uL (ref 0.0–0.1)
Basophils Relative: 0 %
Eosinophils Absolute: 0 K/uL (ref 0.0–0.5)
Eosinophils Relative: 0 %
HCT: 22.7 % — ABNORMAL LOW (ref 39.0–52.0)
Hemoglobin: 7.9 g/dL — ABNORMAL LOW (ref 13.0–17.0)
Immature Granulocytes: 10 %
Lymphocytes Relative: 4 %
Lymphs Abs: 0.5 K/uL — ABNORMAL LOW (ref 0.7–4.0)
MCH: 30.9 pg (ref 26.0–34.0)
MCHC: 34.8 g/dL (ref 30.0–36.0)
MCV: 88.7 fL (ref 80.0–100.0)
Monocytes Absolute: 0.3 K/uL (ref 0.1–1.0)
Monocytes Relative: 3 %
Neutro Abs: 8.5 K/uL — ABNORMAL HIGH (ref 1.7–7.7)
Neutrophils Relative %: 83 %
Platelets: 58 K/uL — ABNORMAL LOW (ref 150–400)
RBC: 2.56 MIL/uL — ABNORMAL LOW (ref 4.22–5.81)
RDW: 16.4 % — ABNORMAL HIGH (ref 11.5–15.5)
Smear Review: NORMAL
WBC: 10.3 K/uL (ref 4.0–10.5)
nRBC: 6 % — ABNORMAL HIGH (ref 0.0–0.2)

## 2024-01-24 LAB — PREPARE CRYOPRECIPITATE
Unit division: 0
Unit division: 0
Unit division: 0
Unit division: 0
Unit division: 0

## 2024-01-24 LAB — CBC
HCT: 21.3 % — ABNORMAL LOW (ref 39.0–52.0)
Hemoglobin: 7.4 g/dL — ABNORMAL LOW (ref 13.0–17.0)
MCH: 30.6 pg (ref 26.0–34.0)
MCHC: 34.7 g/dL (ref 30.0–36.0)
MCV: 88 fL (ref 80.0–100.0)
Platelets: 69 K/uL — ABNORMAL LOW (ref 150–400)
RBC: 2.42 MIL/uL — ABNORMAL LOW (ref 4.22–5.81)
RDW: 16.7 % — ABNORMAL HIGH (ref 11.5–15.5)
WBC: 13.1 K/uL — ABNORMAL HIGH (ref 4.0–10.5)
nRBC: 1.8 % — ABNORMAL HIGH (ref 0.0–0.2)

## 2024-01-24 LAB — BPAM CRYOPRECIPITATE
Blood Product Expiration Date: 202511222359
Blood Product Expiration Date: 202511252359
Blood Product Expiration Date: 202511252359
Blood Product Expiration Date: 202511252359
Blood Product Expiration Date: 202511252359
ISSUE DATE / TIME: 202511200252
ISSUE DATE / TIME: 202511201458
ISSUE DATE / TIME: 202511201557
ISSUE DATE / TIME: 202511202011
ISSUE DATE / TIME: 202511202146
Unit Type and Rh: 5100
Unit Type and Rh: 5100
Unit Type and Rh: 5100
Unit Type and Rh: 5100
Unit Type and Rh: 6200

## 2024-01-24 LAB — PREPARE FRESH FROZEN PLASMA: Unit division: 0

## 2024-01-24 LAB — PREPARE RBC (CROSSMATCH)

## 2024-01-24 LAB — RENAL FUNCTION PANEL
Albumin: 2.2 g/dL — ABNORMAL LOW (ref 3.5–5.0)
Anion gap: 27 — ABNORMAL HIGH (ref 5–15)
BUN: 39 mg/dL — ABNORMAL HIGH (ref 8–23)
CO2: 19 mmol/L — ABNORMAL LOW (ref 22–32)
Calcium: 8.7 mg/dL — ABNORMAL LOW (ref 8.9–10.3)
Chloride: 92 mmol/L — ABNORMAL LOW (ref 98–111)
Creatinine, Ser: 3.52 mg/dL — ABNORMAL HIGH (ref 0.61–1.24)
GFR, Estimated: 18 mL/min — ABNORMAL LOW (ref >60)
Glucose, Bld: 82 mg/dL (ref 70–99)
Phosphorus: 6.4 mg/dL — ABNORMAL HIGH (ref 2.5–4.6)
Potassium: 5.3 mmol/L — ABNORMAL HIGH (ref 3.5–5.1)
Sodium: 138 mmol/L (ref 135–145)

## 2024-01-24 LAB — COMPREHENSIVE METABOLIC PANEL WITH GFR
ALT: 1134 U/L — ABNORMAL HIGH (ref 0–44)
AST: 4779 U/L — ABNORMAL HIGH (ref 15–41)
Albumin: 1.8 g/dL — ABNORMAL LOW (ref 3.5–5.0)
Alkaline Phosphatase: 207 U/L — ABNORMAL HIGH (ref 38–126)
Anion gap: 24 — ABNORMAL HIGH (ref 5–15)
BUN: 46 mg/dL — ABNORMAL HIGH (ref 8–23)
CO2: 25 mmol/L (ref 22–32)
Calcium: 7.1 mg/dL — ABNORMAL LOW (ref 8.9–10.3)
Chloride: 89 mmol/L — ABNORMAL LOW (ref 98–111)
Creatinine, Ser: 4.28 mg/dL — ABNORMAL HIGH (ref 0.61–1.24)
GFR, Estimated: 14 mL/min — ABNORMAL LOW (ref >60)
Glucose, Bld: 234 mg/dL — ABNORMAL HIGH (ref 70–99)
Potassium: 6.3 mmol/L (ref 3.5–5.1)
Sodium: 138 mmol/L (ref 135–145)
Total Bilirubin: 2.6 mg/dL — ABNORMAL HIGH (ref 0.0–1.2)
Total Protein: 3.9 g/dL — ABNORMAL LOW (ref 6.5–8.1)

## 2024-01-24 LAB — GLUCOSE, CAPILLARY
Glucose-Capillary: 42 mg/dL — CL (ref 70–99)
Glucose-Capillary: 45 mg/dL — ABNORMAL LOW (ref 70–99)
Glucose-Capillary: 52 mg/dL — ABNORMAL LOW (ref 70–99)
Glucose-Capillary: 239 mg/dL — ABNORMAL HIGH (ref 70–99)
Glucose-Capillary: 159 mg/dL — ABNORMAL HIGH (ref 70–99)
Glucose-Capillary: 129 mg/dL — ABNORMAL HIGH (ref 70–99)
Glucose-Capillary: 93 mg/dL (ref 70–99)
Glucose-Capillary: 106 mg/dL — ABNORMAL HIGH (ref 70–99)
Glucose-Capillary: 68 mg/dL — ABNORMAL LOW (ref 70–99)
Glucose-Capillary: 98 mg/dL (ref 70–99)
Glucose-Capillary: 83 mg/dL (ref 70–99)

## 2024-01-24 LAB — CALCIUM, IONIZED: Calcium, Ionized, Serum: 3.5 mg/dL — ABNORMAL LOW (ref 4.5–5.6)

## 2024-01-24 LAB — PATHOLOGIST SMEAR REVIEW

## 2024-01-24 LAB — MAGNESIUM: Magnesium: 1.7 mg/dL (ref 1.7–2.4)

## 2024-01-24 MED ORDER — FENTANYL CITRATE (PF) 2500 MCG/50ML IJ SOLN
0.0000 ug/h | Status: DC
  Administered 2024-01-24: 175 ug/h via INTRAVENOUS
  Administered 2024-01-25: 100 ug/h via INTRAVENOUS
  Filled 2024-01-24 (×2): qty 100

## 2024-01-24 MED ORDER — DEXTROSE 50 % IV SOLN
INTRAVENOUS | Status: AC
  Administered 2024-01-24: 25 g via INTRAVENOUS
  Filled 2024-01-24: qty 50

## 2024-01-24 MED ORDER — DEXAMETHASONE SOD PHOSPHATE PF 10 MG/ML IJ SOLN: 10.0000 mg | INTRAMUSCULAR | Status: DC

## 2024-01-24 MED ORDER — DEXTROSE 5 % IV SOLN
12.5000 mg/kg/h | INTRAVENOUS | Status: AC
  Administered 2024-01-24: 12.5 mg/kg/h via INTRAVENOUS
  Filled 2024-01-24: qty 90

## 2024-01-24 MED ORDER — ACETYLCYSTEINE LOAD VIA INFUSION
150.0000 mg/kg | Freq: Once | INTRAVENOUS | Status: AC
  Administered 2024-01-24: 9180 mg via INTRAVENOUS
  Filled 2024-01-24: qty 301

## 2024-01-24 MED ORDER — DEXTROSE 50 % IV SOLN: 25.0000 g | INTRAVENOUS | Status: AC

## 2024-01-24 MED ORDER — SODIUM CHLORIDE 0.9 % IV SOLN
4.0000 g | Freq: Once | INTRAVENOUS | Status: AC
  Administered 2024-01-24: 4 g via INTRAVENOUS
  Filled 2024-01-24: qty 40

## 2024-01-24 MED ORDER — SODIUM CHLORIDE 0.9 % IV SOLN
6.0000 g | INTRAVENOUS | Status: AC
  Administered 2024-01-24: 6 g via INTRAVENOUS
  Filled 2024-01-24: qty 50

## 2024-01-24 MED ORDER — STERILE WATER FOR INJECTION IJ SOLN
50.0000 ng/kg/min | INTRAVENOUS | Status: DC
  Administered 2024-01-24 – 2024-01-25 (×4): 50 ng/kg/min via RESPIRATORY_TRACT
  Administered 2024-01-25: 40 ng/kg/min via RESPIRATORY_TRACT
  Administered 2024-01-25 (×2): 50 ng/kg/min via RESPIRATORY_TRACT
  Filled 2024-01-24 (×11): qty 5

## 2024-01-24 MED ORDER — PRISMASOL BGK 2/3.5 32-2-3.5 MEQ/L EC SOLN: Status: DC

## 2024-01-24 MED ORDER — HEPARIN SODIUM (PORCINE) 1000 UNIT/ML DIALYSIS
1000.0000 [IU] | INTRAMUSCULAR | Status: DC | PRN
  Filled 2024-01-24: qty 6

## 2024-01-24 MED ORDER — DEXTROSE 5 % IV SOLN
6.2500 mg/kg/h | INTRAVENOUS | Status: DC
  Administered 2024-01-24 – 2024-01-25 (×2): 6.25 mg/kg/h via INTRAVENOUS
  Filled 2024-01-24 (×3): qty 90

## 2024-01-24 MED ORDER — INSULIN ASPART 100 UNIT/ML IV SOLN
10.0000 [IU] | Freq: Once | INTRAVENOUS | Status: AC
  Administered 2024-01-24: 10 [IU] via INTRAVENOUS
  Filled 2024-01-24: qty 10

## 2024-01-24 MED ORDER — DEXAMETHASONE SOD PHOSPHATE PF 10 MG/ML IJ SOLN
20.0000 mg | INTRAMUSCULAR | Status: DC
  Administered 2024-01-24 – 2024-01-26 (×3): 20 mg via INTRAVENOUS

## 2024-01-24 MED ORDER — SODIUM CHLORIDE 0.9% IV SOLUTION: Freq: Once | INTRAVENOUS | Status: AC

## 2024-01-24 MED ORDER — SODIUM CHLORIDE 0.9 % FOR CRRT: INTRAVENOUS_CENTRAL | Status: DC | PRN

## 2024-01-24 MED ORDER — SODIUM ZIRCONIUM CYCLOSILICATE 10 G PO PACK
10.0000 g | PACK | Freq: Once | ORAL | Status: AC
  Administered 2024-01-24: 10 g
  Filled 2024-01-24: qty 1

## 2024-01-24 MED ORDER — SODIUM BICARBONATE 8.4 % IV SOLN
50.0000 meq | Freq: Once | INTRAVENOUS | Status: AC
  Administered 2024-01-24: 50 meq via INTRAVENOUS
  Filled 2024-01-24: qty 50

## 2024-01-24 MED ORDER — CISATRACURIUM BESYLATE 20 MG/10ML IV SOLN
6.2000 mg | Freq: Once | INTRAVENOUS | Status: AC
  Administered 2024-01-24: 6.2 mg via INTRAVENOUS
  Filled 2024-01-24: qty 10

## 2024-01-24 MED ORDER — DEXTROSE 50 % IV SOLN
12.5000 g | INTRAVENOUS | Status: AC
  Administered 2024-01-24: 12.5 g via INTRAVENOUS
  Filled 2024-01-24: qty 50

## 2024-01-24 MED ORDER — DEXTROSE 10 % IV SOLN: INTRAVENOUS | Status: DC

## 2024-01-24 MED ORDER — MAGNESIUM SULFATE 2 GM/50ML IV SOLN
2.0000 g | Freq: Once | INTRAVENOUS | Status: AC
  Administered 2024-01-24: 2 g via INTRAVENOUS
  Filled 2024-01-24: qty 50

## 2024-01-24 MED ORDER — VITAMIN K1 10 MG/ML IJ SOLN
5.0000 mg | Freq: Once | INTRAVENOUS | Status: AC
  Administered 2024-01-24: 5 mg via INTRAVENOUS
  Filled 2024-01-24: qty 0.5

## 2024-01-24 MED ORDER — DEXTROSE 50 % IV SOLN
1.0000 | Freq: Once | INTRAVENOUS | Status: AC
  Administered 2024-01-24: 50 mL via INTRAVENOUS
  Filled 2024-01-24: qty 50

## 2024-01-24 NOTE — Procedures (Signed)
 Central Venous Catheter Insertion Procedure Note  William Perkins  [id number]  Sep 07, 1950  Date:01/24/24  Time:10:31 AM   Provider Performing:Hagar Sadiq A Bertis Hustead   Procedure: Insertion of Non-tunneled Central Venous Catheter(36556)with US  guidance (23062)    Indication(s) Hemodialysis  Consent Risks of the procedure as well as the alternatives and risks of each were explained to the patient and/or caregiver.  Consent for the procedure was obtained and is signed in the bedside chart  Anesthesia Topical only with 1% lidocaine    Timeout Verified patient identification, verified procedure, site/side was marked, verified correct patient position, special equipment/implants available, medications/allergies/relevant history reviewed, required imaging and test results available.  Sterile Technique Maximal sterile technique including full sterile barrier drape, hand hygiene, sterile gown, sterile gloves, mask, hair covering, sterile ultrasound probe cover (if used).  Procedure Description Area of catheter insertion was cleaned with chlorhexidine  and draped in sterile fashion.   With real-time ultrasound guidance a HD catheter was placed into the left internal jugular vein.  Nonpulsatile blood flow and easy flushing noted in all ports.  The catheter was sutured in place and sterile dressing applied.  Complications/Tolerance None; patient tolerated the procedure well. Chest X-ray is ordered to verify placement for internal jugular or subclavian cannulation.  Chest x-ray is not ordered for femoral cannulation.  EBL Minimal  Specimen(s) None

## 2024-01-24 NOTE — Progress Notes (Signed)
 Patient was proned at 1152.  ETT was taped and secured in the center.  Sofie was able to be passed with no obstruction once patient proned.

## 2024-01-24 NOTE — Progress Notes (Signed)
 Patient was proned but had no significant improvement in his saturations and PaO2  Saturations started to trend downwards despite starting him on CRRT to try to remove fluids  Proning was discontinued as he is not showing any benefits from proning  Increased his PEEP to 15 from 14 Still trying to maintain a plateau pressure less than 30  Driving pressure at 15  Will add steroids Ordered Decadron  20 daily for 5 days and then 10 mg daily for 5 days

## 2024-01-24 NOTE — Progress Notes (Signed)
 Nephrology Follow-Up Consult note   Assessment/Recommendations: William Perkins is a/an 73 y.o. male with a past medical history significant for PVD, CKD, HTN, HLD who presents with cardiac arrest c/b AKI, shock, and acidosis   AKI on CKD 3b: Baseline creatinine around 2-2.4.  AKI secondary to ATN from shock -Will proceed with CRRT -Continue to monitor daily Cr, Dose meds for GFR -Monitor Daily I/Os, Daily weight  -Maintain MAP>65 for optimal renal perfusion.  -Avoid nephrotoxic medications including NSAIDs -Use synthetic opioids (Fentanyl /Dilaudid ) if needed - Foley catheter   Shock: Hemorrhagic.  May be vasoplegic or septic.  Continue pressors and further treatment per primary team   Acute hypoxic respiratory failure: Associated with aspiration.  Management per primary team continue ventilation   Severe anion gap metabolic acidosis: Associated with lactic acidosis.  Continue with treatment of shock which should allow clearance of lactate.  Plan for CRRT as above  DIC: Continue supportive care per primary team   PEA arrest: On 11/19 associated with surgery.   Peripheral vascular disease: Status post amputation on 11/19 with vascular surgery   Anemia: Likely related blood loss.  Transfusion as needed.  Goals of care: Severely ill.  Sounds like worsening outpatient before this.  Consider palliative care.  Advance goals of care conversations as able   Recommendations conveyed to primary service.    [facility] 01/24/2024 10:33 AM  ___________________________________________________________  CC: Cardiac arrest  Interval History/Subjective: Patient remains critically ill.  Essentially no urine output for 24 hours.  Creatinine rising.  Worsening hyperkalemia.  Discussed case with the patient's wife.  Opting towards dialysis for now.  We discussed the severity of his illness   Medications:  Current Facility-Administered Medications  Medication  Dose Route Frequency Provider Last Rate Last Admin   0.9 %  sodium chloride  infusion (Manually program via Guardrails IV Fluids)   Intravenous Once Rosan Deward ORN, NP   Held at 01/23/24 2047   acetylcysteine  (ACETADOTE ) 30.5 mg/mL load via infusion 9,180 mg  150 mg/kg Intravenous Once Olalere, Adewale A, MD   9,180 mg at 01/24/24 1027   Followed by   acetylcysteine  (ACETADOTE ) 18,000 mg in dextrose  5 % 590 mL (30.5085 mg/mL) infusion  12.5 mg/kg/hr Intravenous Continuous Olalere, Adewale A, MD 25.1 mL/hr at 01/24/24 1027 12.5 mg/kg/hr at 01/24/24 1027   Followed by   acetylcysteine  (ACETADOTE ) 18,000 mg in dextrose  5 % 590 mL (30.5085 mg/mL) infusion  6.25 mg/kg/hr Intravenous Continuous Olalere, Adewale A, MD       calcium  gluconate 6 g in sodium chloride  0.9 % 250 mL IVPB  6 g Intravenous STAT Merilee Linsey I, RPH 310 mL/hr at 01/24/24 1030 6 g at 01/24/24 1030   cefTRIAXone  (ROCEPHIN ) 2 g in sodium chloride  0.9 % 100 mL IVPB  2 g Intravenous Q24H Olalere, Adewale A, MD   Stopped at 01/23/24 2303   Chlorhexidine  Gluconate Cloth 2 % PADS 6 each  6 each Topical Daily Mohammed, Shahid, MD       dexmedetomidine  (PRECEDEX ) 400 MCG/100ML (4 mcg/mL) infusion  0-1.2 mcg/kg/hr Intravenous Titrated Mohammed, Shahid, MD 17.43 mL/hr at 01/24/24 1000 1.2 mcg/kg/hr at 01/24/24 1000   dextrose  10 % infusion   Intravenous Continuous Olalere, Adewale A, MD 10 mL/hr at 01/24/24 1000 Infusion Verify at 01/24/24 1000   docusate (COLACE) 50 MG/5ML liquid 100 mg  100 mg Per Tube BID PRN Gatha Pence, MD       epoprostenol  (VELETRI ) for inhalation 1.5mg /10mL (30,000 ng/mL)  50 ng/kg/min (Ideal) Inhalation Continuous Paliwal, Aditya, MD 7.53 mL/hr at 01/24/24 0655 50 ng/kg/min at 01/24/24 0655   fentaNYL  (SUBLIMAZE ) 5000 mcg / 100 mL (50 mcg/mL) infusion  0-400 mcg/hr Intravenous Continuous Merilee Linsey I, RPH       fentaNYL  (SUBLIMAZE ) bolus via infusion 25-100 mcg  25-100 mcg Intravenous Q15 min PRN  Ilah Corean HERO, PA-C   75 mcg at 01/23/24 1611   fentaNYL  (SUBLIMAZE ) injection 50 mcg  50 mcg Intravenous Q15 min PRN Ilah Corean HERO, PA-C       heparin  injection 1,000-6,000 Units  1,000-6,000 Units CRRT PRN Macel Jayson PARAS, MD       insulin  aspart (novoLOG ) injection 0-15 Units  0-15 Units Subcutaneous Q4H Paliwal, Aditya, MD   3 Units at 01/24/24 0817   norepinephrine  (LEVOPHED ) 16 mg in 250mL (0.064 mg/mL) premix infusion  0-60 mcg/min Intravenous Titrated Laron Agent, RPH 11.25 mL/hr at 01/24/24 1000 12 mcg/min at 01/24/24 1000   ondansetron  (ZOFRAN ) injection 4 mg  4 mg Intravenous Q6H PRN Rhyne, Samantha J, PA-C       Oral care mouth rinse  15 mL Mouth Rinse Q2H Ogan, Okoronkwo U, MD   15 mL at 01/24/24 9076   Oral care mouth rinse  15 mL Mouth Rinse PRN Ogan, Okoronkwo U, MD       pantoprazole  (PROTONIX ) injection 40 mg  40 mg Intravenous Q12H Olalere, Adewale A, MD   40 mg at 01/24/24 0920   phenol (CHLORASEPTIC) mouth spray 1 spray  1 spray Mouth/Throat PRN Rhyne, Samantha J, PA-C       polyethylene glycol (MIRALAX  / GLYCOLAX ) packet 17 g  17 g Per Tube Daily PRN Gatha Pence, MD       PrismaSol  BGK 2/3.5 infusion   CRRT Continuous Macel Jayson PARAS, MD       PrismaSol  BGK 2/3.5 infusion   CRRT Continuous Macel Jayson PARAS, MD       PrismaSol  BGK 2/3.5 infusion   CRRT Continuous Macel Jayson PARAS, MD       sodium chloride  0.9 % primer fluid for CRRT   CRRT PRN Macel Jayson PARAS, MD       vasopressin  (PITRESSIN) 20 Units in 100 mL (0.2 unit/mL) infusion-*FOR SHOCK*  0.04 Units/min Intravenous Continuous Mohammed, Shahid, MD 12 mL/hr at 01/24/24 1000 0.04 Units/min at 01/24/24 1000      Review of Systems: 10 systems reviewed and negative except per interval history/subjective  Physical Exam: Vitals:   01/24/24 0945 01/24/24 1000  BP:    Pulse: 74   Resp: 18 18  Temp: (!) 97.3 F (36.3 C) (!) 97.5 F (36.4 C)  SpO2: 93%    Total I/O In: 1478.2 [I.V.:416.5;  Blood:921.8; IV Piggyback:139.9] Out: 20 [Urine:20]  Intake/Output Summary (Last 24 hours) at 01/24/2024 1033 Last data filed at 01/24/2024 1000 Gross per 24 hour  Intake 6104.94 ml  Output 370 ml  Net 5734.94 ml   General: Ill-appearing, lying in bed, sedated HEENT: Eyes closed, no nasal discharge, moist mucous membranes CV: Normal rate, no rub Lungs: Coarse bilateral breath sounds, ventilated, bilateral chest rise Abd: soft, non-tender, non-distended Skin: no visible lesions or rashes Musculoskeletal: left leg absent above the knee, no obvious deformities Neuro: Sedated, not interactive   Test Results I personally reviewed new and old clinical labs and radiology tests Lab Results  Component Value Date   NA 133 (L) 01/24/2024   K 5.4 (H) 01/24/2024   CL 89 (L) 01/24/2024  CO2 25 01/24/2024   BUN 46 (H) 01/24/2024   CREATININE 4.28 (H) 01/24/2024   CALCIUM  7.1 (L) 01/24/2024   ALBUMIN  1.8 (L) 01/24/2024    CBC Recent Labs  Lab 01/23/24 0003 01/23/24 0209 01/23/24 9367 01/23/24 0958 01/23/24 1158 01/23/24 1738 01/24/24 0249 01/24/24 0349 01/24/24 0507 01/24/24 0520 01/24/24 0850  WBC 12.4*  --  7.9  --   --   --   --  13.1*  --   --   --   HGB 6.2*   < > 8.7*   < > 9.0*  --    < > 7.4* 6.8* 6.8* 6.8*  HCT 20.9*   < > 28.5*   < > 27.7*  --    < > 21.3* 20.0* 20.0* 20.0*  MCV 103.0*  --  96.9  --   --   --   --  88.0  --   --   --   PLT 167  --  99*  --  141* 106*  --  71*  69*  --   --   --    < > = values in this interval not displayed.

## 2024-01-24 NOTE — Progress Notes (Signed)
 Nutrition Brief Note  Consult received for trickle tube feeds.  Chart reviewed and discussed on unit with CCM.   No indication for initiation of tube feeds today given medical instability.  Patient started on CRRT today with severe metabolic acidosis and being prone at time of visit. Currently now back to supine.   Currently on veletri , levo at 10mcg/min and vaso at 0.04 units/min.  Lactic acid on 11/20 >9.0  Please re-consult when medically appropriate for initiation of tube feeding.   Allie Zoltan Genest, RDN, LDN Clinical Nutrition See AMiON for contact information.

## 2024-01-24 NOTE — Progress Notes (Signed)
 ABG at 330 is showing a big jump in the PaO2-PaO2 in the 300s  Still cannot get a good reading on oximetry  Will repeat ABG in an hour   Patient noted to be minimally responsive Able to squeeze hands   Encouraged spouse to stimulate him with music or sounds that patient might recognize

## 2024-01-24 NOTE — Progress Notes (Signed)
   Inpatient Rehabilitation Admissions Coordinator   We will sign off at this time. Please reconsult when appropriate.  Heron Leavell, RN, MSN Rehab Admissions Coordinator [phone] 01/24/2024 8:13 AM

## 2024-01-24 NOTE — Progress Notes (Signed)
 DIC panel noted  Ordered 2 units FFP Ordered vitamin K   Saturations are improving  Pressor requirement improving, now off vasopressin , on 10 mcg of Levophed  and decreasing  Updated spouse at bedside  CCM time 30 minutes

## 2024-01-24 NOTE — Progress Notes (Signed)
 NAME:  William Perkins, MRN:  [id number], DOB:  09-09-1950, LOS: 2 ADMISSION DATE:  01/22/2024, CONSULTATION DATE: 01/22/2024 REFERRING PHYSICIAN: Georgina - TRH, CHIEF COMPLAINT:  Cardiac Arrest  History of Present Illness:  73 year old man who presented to [facility] 11/19 for elective L AKA. 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  Patient underwent L AKA 11/19 with Dr. Magda ([facility]). Intraoperative course was uncomplicated with EBL. Patient was transferred to PACU in stable condition. Postoperatively while in PACU, patient was noted to be very drowsy and became bradycardic. Rhythm deteriorated into PEA and Code Blue was called. CPR was performed immediately per ACS protocol x 18 minutes with ROSC. Of note, patient had episode of bloody emesis prior to intubation.  On arrival to ICU, patient was initially hemodynamically stable but slowly becoming more hypotensive EKG showed first-degree AV block sinus rhythm with LBBB.  CXR demonstrated bilateral infiltrates (R > L) with concern for aspiration. Levophed  and vasopressin  were started and A-line/CVC placed in ICU. Bronch was completed showing diffuse blood, but no active bleeding; likely aspirated hematemesis. ABG demosntrated significant metabolic acidosis. Bicarb drip was started. Hgb 6.2 and 2U PRBCs ordered + FFP/cryo. Bedside POCUS showed no pericardial effusion no right heart strain and EF appeared low.  PCCM consulted for ICU admission and management.  Pertinent  Medical History   Past Medical History:  Diagnosis Date   Cancer (HCC) 02/2023   laryngeal cancer   Chronic renal insufficiency 03/21/2006   CrCl <50 ml   DDD (degenerative disc disease), cervical    GERD 03/22/2006   Qualifier: Diagnosis of  By: [name]     GERD (gastroesophageal reflux  disease) 03/22/2006   Hand numbness 10/07/2009   pt states not any longer   Hyperlipidemia 03/22/2008   Hypertension    Hypertension 03/22/2006   Left ventricular hypertrophy 03/22/2006   Leg pain, left 11/15/2009   Lumbar strain 07/15/2008   Renal insufficiency    Soft tissue disorder 08/2008   Tobacco abuse 03/22/2006   2 ppd x 30 years   Significant Hospital Events: Including procedures, antibiotic start and stop dates in addition to other pertinent events   11/19 - Left AKA completed, sustained PEA arrest postoperatively. Intubated. 11/21-in the ICU Has received multiple blood products  Interim History / Subjective:  Admitted 11/19 s/p cardiac arrest CPR x 18 min Remains coagulopathic with ongoing bloody output from OGT and ETT (frothy, bloody secretions) Chest x-ray with mild possible ARDS In DIC   Objective:   Blood pressure (!) 146/66, pulse 74, temperature (!) 97.5 F (36.4 C), resp. rate 18, height 5' 11 (1.803 m), weight 61.2 kg, SpO2 93%.    Vent Mode: PRVC FiO2 (%):  [80 %-100 %] 100 % Set Rate:  [18 bmp] 18 bmp Vt Set:  [600 mL] 600 mL PEEP:  [5 cmH20-14 cmH20] 14 cmH20 Plateau Pressure:  [18 cmH20-29 cmH20] 29 cmH20   Intake/Output Summary (Last 24 hours) at 01/24/2024 1033 Last data filed at 01/24/2024 1000 Gross per 24 hour  Intake 6104.94 ml  Output 370 ml  Net 5734.94 ml   Filed Weights   01/22/24 0914 01/22/24 2200 01/23/24 0500  Weight: 58.1 kg 56.1 kg 61.2 kg   Physical Examination: General: Acutely ill,  HEENT: Moist oral mucosa  neuro: Responds to noxious stimuli PULM: Coarse breath  sounds  GI: Soft, bowel sounds appreciated Extremities: New left AKA with wound VAC in place Skin is warm and dry  I reviewed last 24 h vitals and pain scores, last 48 h intake and output, last 24 h labs and trends, and last 24 h imaging results.  DIC blood work shows INR of 2.7, prothrombin time of 30, APTT of 60, D-dimer still greater than  20 7.49/31/97 Resolved Problem List:    Assessment and Plan:   In-hospital cardiac arrest-PEA s/p CPR and ROSC after 18 minutes -Echocardiogram with severely reduced left ventricular ejection fraction of 25 to 30% with global hypokinesis - Continue monitoring - Not a candidate for anticoagulation with his coagulopathy  Hemorrhagic/hypovolemic shock - Goal MAP greater than 65 - On 2 pressors Levophed  at 12 mics per minute, vasopressin  at 0.04 units  ARDS Continue mechanical ventilation per ARDS protocol Target TVol 6-8cc/kgIBW Target Plateau Pressure < 30cm H20 Target driving pressure less than 15 cm of water  Target PaO2 55-65: titrate PEEP/FiO2 per protocol As long as PaO2 to FiO2 ratio is less than 1:150 position in prone position for 16 hours a day Ventilator associated pneumonia prevention protocol  Hemorrhagic shock DIC -4 units of FFP - 6 pools of cryo -3 units of RBC - DIC parameters are slowly improving  GI bleed related to DIC  Acute kidney injury on chronic kidney disease Severe metabolic acidosis -Will be starting CRRT  Possible pneumonia -Empiric pneumonia coverage  Hyperglycemia - Continue SSI -Continue D10  Peripheral vascular disease s/p stents - Postoperative management by surgery - Wound care per surgery  Significant events today - Continue replacing factors for DIC - Patient will be prone with severe ARDS - Will be starting CRRT  Updated spouse at length - Encouraged her to reach out to other family members as patient is very very sick with guarded prognosis  The patient is critically ill with multiple organ systems failure and requires high complexity decision making for assessment and support, frequent evaluation and titration of therapies, application of advanced monitoring technologies and extensive interpretation of multiple databases. Critical Care Time devoted to patient care services described in this note independent of APP/resident  time (if applicable)  is 45 minutes.   Jennet Epley MD [facility] Personal pager: See Amion If unanswered, please page

## 2024-01-24 NOTE — Progress Notes (Signed)
 Patient was placed back in the supine position  at 1348 per MD order.

## 2024-01-24 NOTE — Progress Notes (Signed)
 Assisted with ETT holder exchange

## 2024-01-24 NOTE — Progress Notes (Signed)
 Patient ID: William Perkins, male   DOB: 04/11/1950, 73 y.o.   MRN: [id number]  Remains critically ill in [facility] after cardiac arrest suffered after above knee amputation. Unfortunately, very little I can add to his care at the moment. Recommend keeping bandage to AKA x 1 week. OK for any intervention PCCM thinks is prudent including heparin , etc. Please call if we can help over the weekend.   Debby SAILOR. Magda, MD [facility] Phone Number: [phone] 01/24/2024 10:15 AM

## 2024-01-24 NOTE — Progress Notes (Signed)
 RT assisted in supination of intubated patient per MD order.

## 2024-01-25 DIAGNOSIS — Z89612 Acquired absence of left leg above knee: Secondary | ICD-10-CM | POA: Diagnosis not present

## 2024-01-25 DIAGNOSIS — K7200 Acute and subacute hepatic failure without coma: Secondary | ICD-10-CM | POA: Diagnosis not present

## 2024-01-25 DIAGNOSIS — R748 Abnormal levels of other serum enzymes: Secondary | ICD-10-CM | POA: Diagnosis not present

## 2024-01-25 DIAGNOSIS — D649 Anemia, unspecified: Secondary | ICD-10-CM | POA: Diagnosis not present

## 2024-01-25 LAB — BPAM FFP
Blood Product Expiration Date: 202511262359
Blood Product Expiration Date: 202511262359
Blood Product Expiration Date: 202511262359
Blood Product Expiration Date: 202511262359
ISSUE DATE / TIME: 202511210620
ISSUE DATE / TIME: 202511211221
ISSUE DATE / TIME: 202511211825
ISSUE DATE / TIME: 202511211825
Unit Type and Rh: 7300
Unit Type and Rh: 7300
Unit Type and Rh: 1700
Unit Type and Rh: 7300

## 2024-01-25 LAB — RENAL FUNCTION PANEL
Albumin: 2.4 g/dL — ABNORMAL LOW (ref 3.5–5.0)
Anion gap: 19 — ABNORMAL HIGH (ref 5–15)
BUN: 23 mg/dL (ref 8–23)
CO2: 19 mmol/L — ABNORMAL LOW (ref 22–32)
Calcium: 8 mg/dL — ABNORMAL LOW (ref 8.9–10.3)
Chloride: 93 mmol/L — ABNORMAL LOW (ref 98–111)
Creatinine, Ser: 2.26 mg/dL — ABNORMAL HIGH (ref 0.61–1.24)
GFR, Estimated: 30 mL/min — ABNORMAL LOW (ref >60)
Glucose, Bld: 117 mg/dL — ABNORMAL HIGH (ref 70–99)
Phosphorus: 5.4 mg/dL — ABNORMAL HIGH (ref 2.5–4.6)
Potassium: 5 mmol/L (ref 3.5–5.1)
Sodium: 131 mmol/L — ABNORMAL LOW (ref 135–145)

## 2024-01-25 LAB — POCT I-STAT 7, (LYTES, BLD GAS, ICA,H+H)
Acid-base deficit: 4 mmol/L — ABNORMAL HIGH (ref 0.0–2.0)
Acid-base deficit: 8 mmol/L — ABNORMAL HIGH (ref 0.0–2.0)
Acid-base deficit: 6 mmol/L — ABNORMAL HIGH (ref 0.0–2.0)
Acid-base deficit: 6 mmol/L — ABNORMAL HIGH (ref 0.0–2.0)
Acid-base deficit: 7 mmol/L — ABNORMAL HIGH (ref 0.0–2.0)
Acid-base deficit: 8 mmol/L — ABNORMAL HIGH (ref 0.0–2.0)
Acid-base deficit: 7 mmol/L — ABNORMAL HIGH (ref 0.0–2.0)
Acid-base deficit: 7 mmol/L — ABNORMAL HIGH (ref 0.0–2.0)
Acid-base deficit: 7 mmol/L — ABNORMAL HIGH (ref 0.0–2.0)
Bicarbonate: 19.4 mmol/L — ABNORMAL LOW (ref 20.0–28.0)
Bicarbonate: 16.1 mmol/L — ABNORMAL LOW (ref 20.0–28.0)
Bicarbonate: 18.3 mmol/L — ABNORMAL LOW (ref 20.0–28.0)
Bicarbonate: 18.2 mmol/L — ABNORMAL LOW (ref 20.0–28.0)
Bicarbonate: 17.5 mmol/L — ABNORMAL LOW (ref 20.0–28.0)
Bicarbonate: 16.5 mmol/L — ABNORMAL LOW (ref 20.0–28.0)
Bicarbonate: 17.5 mmol/L — ABNORMAL LOW (ref 20.0–28.0)
Bicarbonate: 16.8 mmol/L — ABNORMAL LOW (ref 20.0–28.0)
Bicarbonate: 17.2 mmol/L — ABNORMAL LOW (ref 20.0–28.0)
Calcium, Ion: 1 mmol/L — ABNORMAL LOW (ref 1.15–1.40)
Calcium, Ion: 0.99 mmol/L — ABNORMAL LOW (ref 1.15–1.40)
Calcium, Ion: 0.96 mmol/L — ABNORMAL LOW (ref 1.15–1.40)
Calcium, Ion: 0.99 mmol/L — ABNORMAL LOW (ref 1.15–1.40)
Calcium, Ion: 1 mmol/L — ABNORMAL LOW (ref 1.15–1.40)
Calcium, Ion: 0.98 mmol/L — ABNORMAL LOW (ref 1.15–1.40)
Calcium, Ion: 1.03 mmol/L — ABNORMAL LOW (ref 1.15–1.40)
Calcium, Ion: 1.01 mmol/L — ABNORMAL LOW (ref 1.15–1.40)
Calcium, Ion: 1 mmol/L — ABNORMAL LOW (ref 1.15–1.40)
HCT: 23 % — ABNORMAL LOW (ref 39.0–52.0)
HCT: 24 % — ABNORMAL LOW (ref 39.0–52.0)
HCT: 22 % — ABNORMAL LOW (ref 39.0–52.0)
HCT: 22 % — ABNORMAL LOW (ref 39.0–52.0)
HCT: 21 % — ABNORMAL LOW (ref 39.0–52.0)
HCT: 19 % — ABNORMAL LOW (ref 39.0–52.0)
HCT: 22 % — ABNORMAL LOW (ref 39.0–52.0)
HCT: 20 % — ABNORMAL LOW (ref 39.0–52.0)
HCT: 20 % — ABNORMAL LOW (ref 39.0–52.0)
Hemoglobin: 7.8 g/dL — ABNORMAL LOW (ref 13.0–17.0)
Hemoglobin: 8.2 g/dL — ABNORMAL LOW (ref 13.0–17.0)
Hemoglobin: 7.5 g/dL — ABNORMAL LOW (ref 13.0–17.0)
Hemoglobin: 7.5 g/dL — ABNORMAL LOW (ref 13.0–17.0)
Hemoglobin: 7.1 g/dL — ABNORMAL LOW (ref 13.0–17.0)
Hemoglobin: 6.5 g/dL — CL (ref 13.0–17.0)
Hemoglobin: 7.5 g/dL — ABNORMAL LOW (ref 13.0–17.0)
Hemoglobin: 6.8 g/dL — CL (ref 13.0–17.0)
Hemoglobin: 6.8 g/dL — CL (ref 13.0–17.0)
O2 Saturation: 100 %
O2 Saturation: 100 %
O2 Saturation: 99 %
O2 Saturation: 99 %
O2 Saturation: 98 %
O2 Saturation: 98 %
O2 Saturation: 98 %
O2 Saturation: 99 %
O2 Saturation: 99 %
Patient temperature: 37.6
Patient temperature: 99.9
Patient temperature: 37.6
Patient temperature: 37.6
Patient temperature: 37.6
Patient temperature: 37.5
Patient temperature: 37.5
Patient temperature: 37.6
Patient temperature: 37.2
Potassium: 5.1 mmol/L (ref 3.5–5.1)
Potassium: 5.3 mmol/L — ABNORMAL HIGH (ref 3.5–5.1)
Potassium: 5.3 mmol/L — ABNORMAL HIGH (ref 3.5–5.1)
Potassium: 4.9 mmol/L (ref 3.5–5.1)
Potassium: 4.9 mmol/L (ref 3.5–5.1)
Potassium: 4.7 mmol/L (ref 3.5–5.1)
Potassium: 5 mmol/L (ref 3.5–5.1)
Potassium: 4.8 mmol/L (ref 3.5–5.1)
Potassium: 4.8 mmol/L (ref 3.5–5.1)
Sodium: 131 mmol/L — ABNORMAL LOW (ref 135–145)
Sodium: 131 mmol/L — ABNORMAL LOW (ref 135–145)
Sodium: 131 mmol/L — ABNORMAL LOW (ref 135–145)
Sodium: 131 mmol/L — ABNORMAL LOW (ref 135–145)
Sodium: 131 mmol/L — ABNORMAL LOW (ref 135–145)
Sodium: 133 mmol/L — ABNORMAL LOW (ref 135–145)
Sodium: 131 mmol/L — ABNORMAL LOW (ref 135–145)
Sodium: 131 mmol/L — ABNORMAL LOW (ref 135–145)
Sodium: 131 mmol/L — ABNORMAL LOW (ref 135–145)
TCO2: 20 mmol/L — ABNORMAL LOW (ref 22–32)
TCO2: 17 mmol/L — ABNORMAL LOW (ref 22–32)
TCO2: 19 mmol/L — ABNORMAL LOW (ref 22–32)
TCO2: 19 mmol/L — ABNORMAL LOW (ref 22–32)
TCO2: 18 mmol/L — ABNORMAL LOW (ref 22–32)
TCO2: 17 mmol/L — ABNORMAL LOW (ref 22–32)
TCO2: 18 mmol/L — ABNORMAL LOW (ref 22–32)
TCO2: 18 mmol/L — ABNORMAL LOW (ref 22–32)
TCO2: 18 mmol/L — ABNORMAL LOW (ref 22–32)
pCO2 arterial: 27.8 mmHg — ABNORMAL LOW (ref 32–48)
pCO2 arterial: 26.1 mmHg — ABNORMAL LOW (ref 32–48)
pCO2 arterial: 33.3 mmHg (ref 32–48)
pCO2 arterial: 31.3 mmHg — ABNORMAL LOW (ref 32–48)
pCO2 arterial: 31.2 mmHg — ABNORMAL LOW (ref 32–48)
pCO2 arterial: 29.6 mmHg — ABNORMAL LOW (ref 32–48)
pCO2 arterial: 30.9 mmHg — ABNORMAL LOW (ref 32–48)
pCO2 arterial: 28 mmHg — ABNORMAL LOW (ref 32–48)
pCO2 arterial: 29.8 mmHg — ABNORMAL LOW (ref 32–48)
pH, Arterial: 7.454 — ABNORMAL HIGH (ref 7.35–7.45)
pH, Arterial: 7.4 (ref 7.35–7.45)
pH, Arterial: 7.351 (ref 7.35–7.45)
pH, Arterial: 7.374 (ref 7.35–7.45)
pH, Arterial: 7.361 (ref 7.35–7.45)
pH, Arterial: 7.357 (ref 7.35–7.45)
pH, Arterial: 7.363 (ref 7.35–7.45)
pH, Arterial: 7.389 (ref 7.35–7.45)
pH, Arterial: 7.371 (ref 7.35–7.45)
pO2, Arterial: 178 mmHg — ABNORMAL HIGH (ref 83–108)
pO2, Arterial: 192 mmHg — ABNORMAL HIGH (ref 83–108)
pO2, Arterial: 142 mmHg — ABNORMAL HIGH (ref 83–108)
pO2, Arterial: 119 mmHg — ABNORMAL HIGH (ref 83–108)
pO2, Arterial: 113 mmHg — ABNORMAL HIGH (ref 83–108)
pO2, Arterial: 110 mmHg — ABNORMAL HIGH (ref 83–108)
pO2, Arterial: 119 mmHg — ABNORMAL HIGH (ref 83–108)
pO2, Arterial: 120 mmHg — ABNORMAL HIGH (ref 83–108)
pO2, Arterial: 131 mmHg — ABNORMAL HIGH (ref 83–108)

## 2024-01-25 LAB — COMPREHENSIVE METABOLIC PANEL WITH GFR
ALT: 250 U/L — ABNORMAL HIGH (ref 0–44)
AST: 4760 U/L — ABNORMAL HIGH (ref 15–41)
Albumin: 2.3 g/dL — ABNORMAL LOW (ref 3.5–5.0)
Alkaline Phosphatase: 238 U/L — ABNORMAL HIGH (ref 38–126)
Anion gap: 26 — ABNORMAL HIGH (ref 5–15)
BUN: 27 mg/dL — ABNORMAL HIGH (ref 8–23)
CO2: 16 mmol/L — ABNORMAL LOW (ref 22–32)
Calcium: 8.3 mg/dL — ABNORMAL LOW (ref 8.9–10.3)
Chloride: 94 mmol/L — ABNORMAL LOW (ref 98–111)
Creatinine, Ser: 2.64 mg/dL — ABNORMAL HIGH (ref 0.61–1.24)
GFR, Estimated: 25 mL/min — ABNORMAL LOW (ref >60)
Glucose, Bld: 65 mg/dL — ABNORMAL LOW (ref 70–99)
Potassium: 5.2 mmol/L — ABNORMAL HIGH (ref 3.5–5.1)
Sodium: 136 mmol/L (ref 135–145)
Total Bilirubin: 4.3 mg/dL — ABNORMAL HIGH (ref 0.0–1.2)
Total Protein: 5.1 g/dL — ABNORMAL LOW (ref 6.5–8.1)

## 2024-01-25 LAB — DIC (DISSEMINATED INTRAVASCULAR COAGULATION)PANEL
D-Dimer, Quant: >20 ug{FEU}/mL — AB (ref 0.00–0.50)
D-Dimer, Quant: >20 ug{FEU}/mL — ABNORMAL HIGH (ref 0.00–0.50)
Fibrinogen: 218 mg/dL (ref 210–475)
Fibrinogen: 244 mg/dL (ref 210–475)
INR: 2.4 — AB (ref 0.8–1.2)
INR: 2.7 — ABNORMAL HIGH (ref 0.8–1.2)
Platelets: 73 K/uL — ABNORMAL LOW (ref 150–400)
Platelets: 71 K/uL — ABNORMAL LOW (ref 150–400)
Prothrombin Time: 27.1 s — AB (ref 11.4–15.2)
Prothrombin Time: 30 s — ABNORMAL HIGH (ref 11.4–15.2)
aPTT: 54 s — AB (ref 24–36)
aPTT: 63 s — ABNORMAL HIGH (ref 24–36)

## 2024-01-25 LAB — PREPARE CRYOPRECIPITATE: Unit division: 0

## 2024-01-25 LAB — BPAM CRYOPRECIPITATE
Blood Product Expiration Date: 202511252359
ISSUE DATE / TIME: 202511210620
Unit Type and Rh: 6200

## 2024-01-25 LAB — GLUCOSE, CAPILLARY
Glucose-Capillary: 77 mg/dL (ref 70–99)
Glucose-Capillary: 27 mg/dL — CL (ref 70–99)
Glucose-Capillary: 41 mg/dL — CL (ref 70–99)
Glucose-Capillary: 54 mg/dL — ABNORMAL LOW (ref 70–99)
Glucose-Capillary: 128 mg/dL — ABNORMAL HIGH (ref 70–99)
Glucose-Capillary: 126 mg/dL — ABNORMAL HIGH (ref 70–99)
Glucose-Capillary: 58 mg/dL — ABNORMAL LOW (ref 70–99)
Glucose-Capillary: 67 mg/dL — ABNORMAL LOW (ref 70–99)
Glucose-Capillary: 123 mg/dL — ABNORMAL HIGH (ref 70–99)
Glucose-Capillary: 84 mg/dL (ref 70–99)
Glucose-Capillary: 94 mg/dL (ref 70–99)
Glucose-Capillary: 83 mg/dL (ref 70–99)

## 2024-01-25 LAB — PREPARE FRESH FROZEN PLASMA
Unit division: 0
Unit division: 0

## 2024-01-25 LAB — CBC
HCT: 24.4 % — ABNORMAL LOW (ref 39.0–52.0)
Hemoglobin: 7.9 g/dL — ABNORMAL LOW (ref 13.0–17.0)
MCH: 31 pg (ref 26.0–34.0)
MCHC: 32.4 g/dL (ref 30.0–36.0)
MCV: 95.7 fL (ref 80.0–100.0)
Platelets: 63 K/uL — ABNORMAL LOW (ref 150–400)
RBC: 2.55 MIL/uL — ABNORMAL LOW (ref 4.22–5.81)
RDW: 17.8 % — ABNORMAL HIGH (ref 11.5–15.5)
WBC: 12.6 K/uL — ABNORMAL HIGH (ref 4.0–10.5)
nRBC: 43.2 % — ABNORMAL HIGH (ref 0.0–0.2)

## 2024-01-25 LAB — MAGNESIUM: Magnesium: 2.1 mg/dL (ref 1.7–2.4)

## 2024-01-25 LAB — PREPARE RBC (CROSSMATCH)

## 2024-01-25 LAB — PHOSPHORUS: Phosphorus: 5.3 mg/dL — ABNORMAL HIGH (ref 2.5–4.6)

## 2024-01-25 LAB — CALCIUM, IONIZED: Calcium, Ionized, Serum: 3.4 mg/dL — ABNORMAL LOW (ref 4.5–5.6)

## 2024-01-25 LAB — LACTIC ACID, PLASMA: Lactic Acid, Venous: >9 mmol/L (ref 0.5–1.9)

## 2024-01-25 MED ORDER — DEXTROSE 50 % IV SOLN
25.0000 g | INTRAVENOUS | Status: AC
  Administered 2024-01-25: 25 g via INTRAVENOUS

## 2024-01-25 MED ORDER — DEXTROSE 50 % IV SOLN
INTRAVENOUS | Status: AC
  Filled 2024-01-25: qty 50

## 2024-01-25 MED ORDER — SODIUM CHLORIDE 0.9% IV SOLUTION: Freq: Once | INTRAVENOUS | Status: AC

## 2024-01-25 MED ORDER — DEXTROSE 50 % IV SOLN
12.5000 g | INTRAVENOUS | Status: AC
  Administered 2024-01-25: 12.5 g via INTRAVENOUS

## 2024-01-25 MED ORDER — VITAL HP 1.0 CAL PO LIQD
1000.0000 mL | ORAL | Status: DC
  Administered 2024-01-25 – 2024-01-26 (×2): 1000 mL
  Filled 2024-01-25: qty 1000

## 2024-01-25 MED ORDER — STERILE WATER FOR INJECTION IJ SOLN
INTRAMUSCULAR | Status: AC
  Filled 2024-01-25: qty 10

## 2024-01-25 MED ORDER — SODIUM CHLORIDE 0.9% IV SOLUTION: Freq: Once | INTRAVENOUS | Status: DC

## 2024-01-25 MED ORDER — AMIODARONE LOAD VIA INFUSION: 150.0000 mg | Freq: Once | INTRAVENOUS | Status: DC

## 2024-01-25 MED ORDER — AMIODARONE HCL IN DEXTROSE 360-4.14 MG/200ML-% IV SOLN: 60.0000 mg/h | INTRAVENOUS | Status: DC

## 2024-01-25 MED ORDER — FENTANYL CITRATE (PF) 50 MCG/ML IJ SOSY
25.0000 ug | PREFILLED_SYRINGE | INTRAMUSCULAR | Status: DC | PRN
Start: 2024-01-25 — End: 2024-01-27
  Administered 2024-01-25: 100 ug via INTRAVENOUS
  Administered 2024-01-25: 50 ug via INTRAVENOUS
  Filled 2024-01-25: qty 1
  Filled 2024-01-25: qty 2

## 2024-01-25 MED ORDER — AMIODARONE HCL IN DEXTROSE 360-4.14 MG/200ML-% IV SOLN: 30.0000 mg/h | INTRAVENOUS | Status: DC

## 2024-01-25 NOTE — Progress Notes (Signed)
 Weaning Veletri  initiated.  ABG drawn as ordered.  Dose decreased to 40 ng/kg/min.

## 2024-01-25 NOTE — Plan of Care (Signed)
  Problem: Coping: Goal: Level of anxiety will decrease Outcome: Progressing   Problem: Elimination: Goal: Will not experience complications related to bowel motility Outcome: Progressing   Problem: Pain Managment: Goal: General experience of comfort will improve and/or be controlled Outcome: Progressing   Problem: Safety: Goal: Ability to remain free from injury will improve Outcome: Progressing   Problem: Activity: Goal: Risk for activity intolerance will decrease Outcome: Not Progressing   Problem: Nutrition: Goal: Adequate nutrition will be maintained Outcome: Not Progressing

## 2024-01-25 NOTE — Progress Notes (Signed)
 [facility] GASTROENTEROLOGY ROUNDING NOTE   Subjective: Intubated.  No family members at bedside during my evaluation this morning.  Weaning pressors.   Objective: Vital signs in last 24 hours: Temp:  [96.4 F (35.8 C)-99.9 F (37.7 C)] 99.9 F (37.7 C) (11/22 1419) Pulse Rate:  [53-122] 97 (11/22 1419) Resp:  [17-21] 18 (11/22 1419) BP: (126-186)/(58-82) 186/82 (11/22 0402) SpO2:  [62 %-91 %] 81 % (11/22 1419) Arterial Line BP: (78-200)/(44-92) 149/60 (11/22 1419) FiO2 (%):  [40 %-100 %] 40 % (11/22 1408) Weight:  [65.4 kg] 65.4 kg (11/22 0500) Last BM Date : 01/24/24 General: Intubated Abdomen:  Soft, ND, +BS   Intake/Output from previous day: 11/21 0701 - 11/22 0700 In: 4482.8 [I.V.:2063.6; Blood:1818.5; IV Piggyback:600.7] Out: 2506.5 [Urine:20] Intake/Output this shift: Total I/O In: 497 [I.V.:497] Out: 839    Lab Results: Recent Labs    01/23/24 0632 01/23/24 0958 01/24/24 0349 01/24/24 0507 01/24/24 1030 01/24/24 1529 01/24/24 1700 01/24/24 2225 01/24/24 2320 01/25/24 0450 01/25/24 0601 01/25/24 1026 01/25/24 1349  WBC 7.9  --  13.1*  --  10.3  --   --   --   --   --   --   --   --   HGB 8.7*   < > 7.4*   < > 7.9*   < >  --    < >  --  7.8*  --  8.2* 7.5*  PLT 99*   < > 71*  69*   < > 58*  --  75*  --  73*  --  71*  --   --   MCV 96.9  --  88.0  --  88.7  --   --   --   --   --   --   --   --    < > = values in this interval not displayed.   BMET Recent Labs    01/24/24 0349 01/24/24 0507 01/24/24 1527 01/24/24 1529 01/25/24 0344 01/25/24 0450 01/25/24 1026 01/25/24 1349  NA 138   < > 138   < > 136 131* 131* 131*  K 6.3*   < > 5.3*   < > 5.2* 5.1 5.3* 5.3*  CL 89*  --  92*  --  94*  --   --   --   CO2 25  --  19*  --  16*  --   --   --   GLUCOSE 234*  --  82  --  65*  --   --   --   BUN 46*  --  39*  --  27*  --   --   --   CREATININE 4.28*  --  3.52*  --  2.64*  --   --   --   CALCIUM  7.1*  --  8.7*  --  8.3*  --   --   --    < > =  values in this interval not displayed.   LFT Recent Labs    01/23/24 0632 01/24/24 0349 01/24/24 1527 01/25/24 0344  PROT 4.3* 3.9*  --  5.1*  ALBUMIN  2.0* 1.8* 2.2* 2.3*  AST 3,336* 4,779*  --  4,760*  ALT 3,913* 1,134*  --  250*  ALKPHOS 243* 207*  --  238*  BILITOT 1.8* 2.6*  --  4.3*   PT/INR Recent Labs    01/24/24 2320 01/25/24 0601  INR 2.4* 2.7*      Imaging/Other results: DG CHEST PORT 1  VIEW Result Date: 01/24/2024 EXAM: 1 VIEW(S) XRAY OF THE CHEST 01/24/2024 01:09:00 PM COMPARISON: 01/24/2024 CLINICAL HISTORY: Endotracheal tube present FINDINGS: LINES, TUBES AND DEVICES: The endotracheal tube is noted in a satisfactory position. Bilateral jugular central lines are again seen and stable. A gastric catheter shows the tip in the stomach, although the proximal side port lies in the distal esophagus. This should be advanced deeper into the stomach. LUNGS AND PLEURA: Persistent increased perihilar density is noted with slight worsening consistent with progressive edema . Bilateral pleural effusions, right greater than left, are again seen. No pneumothorax. HEART AND MEDIASTINUM: No acute abnormality of the cardiac and mediastinal silhouettes. BONES AND SOFT TISSUES: No acute osseous abnormality. IMPRESSION: 1. Slight worsening of perihilar opacities consistent with progressive edema 2. Bilateral pleural effusions, right greater than left. 3. Gastric catheter tip projects over the stomach with proximal side port in the distal esophagus; recommend advancing the catheter deeper into the stomach. Electronically signed by: [name] 01/24/2024 02:23 PM EST RP Workstation: [id number]   DG Chest Port 1 View Result Date: 01/24/2024 EXAM: 1 VIEW(S) XRAY OF THE CHEST 01/24/2024 10:24:00 AM COMPARISON: 01/24/2024 CLINICAL HISTORY: Hypoxia FINDINGS: LINES, TUBES AND DEVICES: ETT in place with tip 6 cm above the carina. Enteric tube in place with tip over stomach. The proximal side hole  lies in the vicinity of the gastroesophageal junction. This should be advanced deeper into the stomach. Left chest CVC in place with tip in distal SVC. Right chest CVC in place with tip in mid SVC. LUNGS AND PLEURA: Bilateral pleural effusions. Increased perihilar density is noted bilaterally consistent with edema. No pneumothorax. HEART AND MEDIASTINUM: Atherosclerotic calcifications. BONES AND SOFT TISSUES: No acute osseous abnormality. IMPRESSION: 1. Bilateral pleural effusions with increased perihilar opacities consistent with pulmonary edema. 2. Enteric tube side hole projects near the gastroesophageal junction; recommend advancing the tube further into the stomach. Electronically signed by: [name] 01/24/2024 02:21 PM EST RP Workstation: [id number]   DG CHEST PORT 1 VIEW Result Date: 01/24/2024 CLINICAL DATA:  Hypoxia EXAM: PORTABLE CHEST 1 VIEW COMPARISON:  Chest radiograph dated 01/23/2024 FINDINGS: Lines/tubes: Endotracheal tube tip projects 6.0 cm above the carina. Gastric/enteric tube tip projects over the stomach. Right internal jugular venous catheter tip projects over the SVC. Metal tipped esophageal temperature probe terminates over the upper esophagus. Lungs: Increased diffuse perihilar and lower lung hazy and interstitial opacities. Pleura: Likely layering bilateral pleural effusions. No pneumothorax. Heart/mediastinum: The heart size and mediastinal contours are within normal limits. Bones: No acute osseous abnormality. IMPRESSION: 1. Increased diffuse perihilar and lower lung hazy and interstitial opacities, likely pulmonary edema. Aspiration or pneumonia can be considered in the appropriate clinical setting. 2. Likely layering bilateral pleural effusions. 3. Support apparatus as described. Electronically Signed   By: [name]   On: 01/24/2024 08:16   VAS US  LOWER EXTREMITY VENOUS (DVT) Result Date: 01/23/2024  Lower Venous DVT Study Patient Name:  William Perkins  Date of  Exam:   01/23/2024 Medical Rec #: [id number]       Accession #:    [id number] Date of Birth: October 06, 1950       Patient Gender: M Patient Age:   17 years Exam Location:  [facility] Procedure:      VAS US  LOWER EXTREMITY VENOUS (DVT) Referring Phys: STEPHANIE REESE --------------------------------------------------------------------------------  Indications: Status post left AKA complicated by post operative cardiac arrest. Question DIC.  Limitations: Bandage, ventilation, shadowing from arterial plaque. Comparison Study:  No prior LEV on file Performing Technologist: Alberta Lis RVS  Examination Guidelines: A complete evaluation includes B-mode imaging, spectral Doppler, color Doppler, and power Doppler as needed of all accessible portions of each vessel. Bilateral testing is considered an integral part of a complete examination. Limited examinations for reoccurring indications may be performed as noted. The reflux portion of the exam is performed with the patient in reverse Trendelenburg.  +---------+---------------+---------+-----------+----------+--------------+ RIGHT    CompressibilityPhasicitySpontaneityPropertiesThrombus Aging +---------+---------------+---------+-----------+----------+--------------+ CFV      Full           Yes      No                                  +---------+---------------+---------+-----------+----------+--------------+ SFJ      Full                                                        +---------+---------------+---------+-----------+----------+--------------+ FV Prox  Full           Yes      No                                  +---------+---------------+---------+-----------+----------+--------------+ FV Mid   Full                                                        +---------+---------------+---------+-----------+----------+--------------+ FV DistalFull                                                         +---------+---------------+---------+-----------+----------+--------------+ PFV      Full           Yes      No                                  +---------+---------------+---------+-----------+----------+--------------+ POP      Full           Yes      No                                  +---------+---------------+---------+-----------+----------+--------------+ PTV      Full                                                        +---------+---------------+---------+-----------+----------+--------------+ PERO     Full                                                        +---------+---------------+---------+-----------+----------+--------------+   +---------+---------------+---------+-----------+----------+--------------+  LEFT     CompressibilityPhasicitySpontaneityPropertiesThrombus Aging +---------+---------------+---------+-----------+----------+--------------+ CFV      Full           Yes      No                                  +---------+---------------+---------+-----------+----------+--------------+ SFJ      Full                                                        +---------+---------------+---------+-----------+----------+--------------+ FV Prox  Full           Yes      No                                  +---------+---------------+---------+-----------+----------+--------------+ FV Mid   Full           Yes      Yes                                 +---------+---------------+---------+-----------+----------+--------------+ FV Distal                                             bandage        +---------+---------------+---------+-----------+----------+--------------+ PFV      Full           Yes      No                                  +---------+---------------+---------+-----------+----------+--------------+ POP                                                   AKA             +---------+---------------+---------+-----------+----------+--------------+     Summary: RIGHT: - There is no evidence of deep vein thrombosis in the lower extremity.  - No cystic structure found in the popliteal fossa.  LEFT: - There is no evidence of deep vein thrombosis in the visualized veins of the left lower extremity.  *See table(s) above for measurements and observations. Electronically signed by Debby Robertson on 01/23/2024 at 8:02:29 PM.    Final       Assessment and Plan:  1) Shock liver 2) Elevated liver enzymes-improving 3) Elevated bilirubin 4) PEA arrest Liver enzymes appear to have plateaued and now improving with sharp decline of ALT and plateau of AST.  T. bili up trended 2.6 --> 4.3, but not unexpected to see a delayed rise in bilirubin after acute liver injury. - Continue NAC for the time being.  If liver enzymes continue to improve, can hopefully DC in the next 24-48 hours - Daily liver enzymes.  As above, would not be unexpected to see bilirubin continues to uptrend despite improvement in AST/ALT  5) DIC 6) Anemia Has  received 3 units RBCs, 6 FFPs, 6 cryoprecipitate.  Did have GI bleed 2/2 DIC with suspected diffuse mucosal bruise.  Was not stable for endoscopic evaluation, but does not appear to have active bleeding currently.  Hemoglobin stable. - Management per primary CCS  7) AKI on CKD 8) Lactic acidosis - On CRRT - Management per Nephrology  9) ARDS 10) Shock - Decreasing pressor support - Management per CCS  Inpatient GI service will follow peripherally at this juncture.  Please do not hesitate to contact us  with additional questions or concerns    Sandor LULLA Flatter, DO  01/25/2024, 3:01 PM [facility] Pager [phone]

## 2024-01-25 NOTE — Progress Notes (Signed)
 Veletri  dose decreased to 20 ng/kg/min.  Will continue to monitor and wean as tolerated.

## 2024-01-25 NOTE — Progress Notes (Signed)
 Nephrology Follow-Up Consult note   Assessment/Recommendations: William Perkins is a/an 72 y.o. male with a past medical history significant for PVD, CKD, HTN, HLD who presents with cardiac arrest c/b AKI, shock, and acidosis   AKI on CKD 3b: Baseline creatinine around 2-2.4.  AKI secondary to ATN from shock - Continue with CRRT as ordered -Continue to monitor daily Cr, Dose meds for GFR -Monitor Daily I/Os, Daily weight  -Maintain MAP>65 for optimal renal perfusion.  -Avoid nephrotoxic medications including NSAIDs -Use synthetic opioids (Fentanyl /Dilaudid ) if needed - Foley catheter   Shock: Hemorrhagic.  May be vasoplegic or septic.  Continue pressors and further treatment per primary team   Acute hypoxic respiratory failure: Associated with aspiration.  Management per primary team continue ventilation   Severe anion gap metabolic acidosis: Associated with lactic acidosis.  Continue CRRT as ordered  DIC/hepatic dysfunction: Coagulopathy likely multifactorial with DIC and possible hepatic dysfunction contributing.  Continue supportive care per primary team   PEA arrest: On 11/19 associated with surgery.   Peripheral vascular disease: Status post amputation on 11/19 with vascular surgery   Anemia: Likely related blood loss.  Transfusion as needed.  Goals of care: Severely ill.  Sounds like worsening outpatient before this.  Advance goals of care as able   Recommendations conveyed to primary service.    [facility] 01/25/2024 7:24 AM  ___________________________________________________________  CC: Cardiac arrest  Interval History/Subjective: After wife's discussions with family decided to start CRRT yesterday.  So far is tolerated fairly well.   Medications:  Current Facility-Administered Medications  Medication Dose Route Frequency Provider Last Rate Last Admin   0.9 %  sodium chloride  infusion (Manually program via Guardrails IV  Fluids)   Intravenous Once Rosan Deward ORN, NP   Held at 01/23/24 2047   acetylcysteine  (ACETADOTE ) 18,000 mg in dextrose  5 % 590 mL (30.5085 mg/mL) infusion  6.25 mg/kg/hr Intravenous Continuous Olalere, Adewale A, MD 12.54 mL/hr at 01/25/24 0658 6.25 mg/kg/hr at 01/25/24 0658   cefTRIAXone  (ROCEPHIN ) 2 g in sodium chloride  0.9 % 100 mL IVPB  2 g Intravenous Q24H Olalere, Adewale A, MD   Stopped at 01/24/24 2151   Chlorhexidine  Gluconate Cloth 2 % PADS 6 each  6 each Topical Daily Gatha Pence, MD   6 each at 01/24/24 2156   dexamethasone  (DECADRON ) injection 20 mg  20 mg Intravenous Q24H Olalere, Adewale A, MD   20 mg at 01/24/24 1501   Followed by   NOREEN ON 01/29/2024] dexamethasone  (DECADRON ) injection 10 mg  10 mg Intravenous Q24H Olalere, Adewale A, MD       dexmedetomidine  (PRECEDEX ) 400 MCG/100ML (4 mcg/mL) infusion  0-1.2 mcg/kg/hr Intravenous Titrated Mohammed, Shahid, MD 15.98 mL/hr at 01/25/24 0721 1.1 mcg/kg/hr at 01/25/24 9278   dextrose  10 % infusion   Intravenous Continuous Olalere, Adewale A, MD 10 mL/hr at 01/25/24 0600 Infusion Verify at 01/25/24 0600   docusate (COLACE) 50 MG/5ML liquid 100 mg  100 mg Per Tube BID PRN Gatha Pence, MD       epoprostenol  (VELETRI ) for inhalation 1.5mg /19mL (30,000 ng/mL)  50 ng/kg/min (Ideal) Inhalation Continuous Paliwal, Aditya, MD 7.53 mL/hr at 01/25/24 0539 50 ng/kg/min at 01/25/24 0539   fentaNYL  (SUBLIMAZE ) 5000 mcg / 100 mL (50 mcg/mL) infusion  0-400 mcg/hr Intravenous Continuous Merilee Linsey I, RPH 4.5 mL/hr at 01/25/24 0600 225 mcg/hr at 01/25/24 0600   fentaNYL  (SUBLIMAZE ) bolus via infusion 25-100 mcg  25-100 mcg Intravenous Q15 min PRN Ilah Corean HERO, PA-C  100 mcg at 01/25/24 0414   fentaNYL  (SUBLIMAZE ) injection 50 mcg  50 mcg Intravenous Q15 min PRN Ilah Corean HERO, PA-C   50 mcg at 01/25/24 0331   heparin  injection 1,000-6,000 Units  1,000-6,000 Units CRRT PRN Macel Jayson PARAS, MD       insulin  aspart  (novoLOG ) injection 0-15 Units  0-15 Units Subcutaneous Q4H Paliwal, Ria, MD   2 Units at 01/24/24 1215   norepinephrine  (LEVOPHED ) 16 mg in 250mL (0.064 mg/mL) premix infusion  0-60 mcg/min Intravenous Titrated Laron Agent, RPH 11.25 mL/hr at 01/25/24 0600 12 mcg/min at 01/25/24 0600   ondansetron  (ZOFRAN ) injection 4 mg  4 mg Intravenous Q6H PRN Rhyne, Samantha J, PA-C       Oral care mouth rinse  15 mL Mouth Rinse Q2H Ogan, Okoronkwo U, MD   15 mL at 01/25/24 0601   Oral care mouth rinse  15 mL Mouth Rinse PRN Ogan, Okoronkwo U, MD       pantoprazole  (PROTONIX ) injection 40 mg  40 mg Intravenous Q12H Olalere, Adewale A, MD   40 mg at 01/24/24 2120   phenol (CHLORASEPTIC) mouth spray 1 spray  1 spray Mouth/Throat PRN Rhyne, Samantha J, PA-C       polyethylene glycol (MIRALAX  / GLYCOLAX ) packet 17 g  17 g Per Tube Daily PRN Gatha Pence, MD       PrismaSol  BGK 2/3.5 infusion   CRRT Continuous Macel Jayson PARAS, MD 400 mL/hr at 01/25/24 0028 New Bag at 01/25/24 0028   PrismaSol  BGK 2/3.5 infusion   CRRT Continuous Macel Jayson PARAS, MD 400 mL/hr at 01/25/24 0028 New Bag at 01/25/24 0028   PrismaSol  BGK 2/3.5 infusion   CRRT Continuous Macel Jayson PARAS, MD 1,500 mL/hr at 01/25/24 0432 New Bag at 01/25/24 0432   sodium chloride  0.9 % primer fluid for CRRT   CRRT PRN Macel Jayson PARAS, MD       vasopressin  (PITRESSIN) 20 Units in 100 mL (0.2 unit/mL) infusion-*FOR SHOCK*  0.04 Units/min Intravenous Continuous Gatha Pence, MD   Stopped at 01/24/24 1610      Review of Systems: 10 systems reviewed and negative except per interval history/subjective  Physical Exam: Vitals:   01/25/24 0545 01/25/24 0600  BP:    Pulse: 83 84  Resp: 18 18  Temp: 99.9 F (37.7 C) 99.9 F (37.7 C)  SpO2: (!) 87% (!) 87%   No intake/output data recorded.  Intake/Output Summary (Last 24 hours) at 01/25/2024 0724 Last data filed at 01/25/2024 0600 Gross per 24 hour  Intake 4427.11 ml  Output  2401.5 ml  Net 2025.61 ml   General: Ill-appearing, lying in bed, sedated HEENT: Eyes open, moist mucous membranes, chewing on ET tube CV: Normal rate, no rub Lungs: Coarse bilateral breath sounds, ventilated, bilateral chest rise Abd: soft, non-tender, non-distended Skin: no visible lesions or rashes Musculoskeletal: left leg absent above the knee, no obvious deformities Neuro: Awake, not interactive  Test Results I personally reviewed new and old clinical labs and radiology tests Lab Results  Component Value Date   NA 131 (L) 01/25/2024   K 5.1 01/25/2024   CL 94 (L) 01/25/2024   CO2 16 (L) 01/25/2024   BUN 27 (H) 01/25/2024   CREATININE 2.64 (H) 01/25/2024   CALCIUM  8.3 (L) 01/25/2024   ALBUMIN  2.3 (L) 01/25/2024   PHOS 5.3 (H) 01/25/2024    CBC Recent Labs  Lab 01/23/24 9367 01/23/24 0958 01/24/24 0349 01/24/24 0507 01/24/24 1030 01/24/24 1529 01/24/24 1657  01/24/24 1700 01/24/24 2225 01/24/24 2320 01/25/24 0450 01/25/24 0601  WBC 7.9  --  13.1*  --  10.3  --   --   --   --   --   --   --   NEUTROABS  --   --   --   --  8.5*  --   --   --   --   --   --   --   HGB 8.7*   < > 7.4*   < > 7.9*   < > 8.2*  --  7.1*  --  7.8*  --   HCT 28.5*   < > 21.3*   < > 22.7*   < > 24.0*  --  21.0*  --  23.0*  --   MCV 96.9  --  88.0  --  88.7  --   --   --   --   --   --   --   PLT 99*   < > 71*  69*   < > 58*  --   --  75*  --  73*  --  71*   < > = values in this interval not displayed.

## 2024-01-25 NOTE — Progress Notes (Signed)
 Veletri  dose decreased to 10 ng/kg/min.  Will continue to monitor and wean as tolerated.

## 2024-01-25 NOTE — Progress Notes (Signed)
 Veletri  stopped.  Provider notified.  Will continue to monitor.

## 2024-01-25 NOTE — Progress Notes (Signed)
 Veletri  dose decreased to 30 ng/kg/min.

## 2024-01-25 NOTE — Progress Notes (Addendum)
 ABG improving  Settings changed- peep down to 8, rate down to 16  Repeat ABG ordered for 5.  He is following commands Able to stick out his tongue, squeezes hand on the right side  1602 Continues to be stable ABG expected after 1700 hrs. If ABG stable, will start weaning down Veletri 

## 2024-01-25 NOTE — Progress Notes (Addendum)
 NAME:  TEREK BEE, MRN:  [id number], DOB:  [birth year], LOS: 3 ADMISSION DATE:  01/22/2024, CONSULTATION DATE: 01/22/2024 REFERRING PHYSICIAN: Georgina - TRH, CHIEF COMPLAINT:  Cardiac Arrest  History of Present Illness:  73 year old man who presented to [facility] 11/19 for elective L AKA. 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  Patient underwent L AKA 11/19 with Dr. Magda ([facility]). Intraoperative course was uncomplicated with EBL. Patient was transferred to PACU in stable condition. Postoperatively while in PACU, patient was noted to be very drowsy and became bradycardic. Rhythm deteriorated into PEA and Code Blue was called. CPR was performed immediately per ACS protocol x 18 minutes with ROSC. Of note, patient had episode of bloody emesis prior to intubation.  On arrival to ICU, patient was initially hemodynamically stable but slowly becoming more hypotensive EKG showed first-degree AV block sinus rhythm with LBBB.  CXR demonstrated bilateral infiltrates (R > L) with concern for aspiration. Levophed  and vasopressin  were started and A-line/CVC placed in ICU. Bronch was completed showing diffuse blood, but no active bleeding; likely aspirated hematemesis. ABG demosntrated significant metabolic acidosis. Bicarb drip was started. Hgb 6.2 and 2U PRBCs ordered + FFP/cryo. Bedside POCUS showed no pericardial effusion no right heart strain and EF appeared low.  PCCM consulted for ICU admission and management.  Pertinent  Medical History   Past Medical History:  Diagnosis Date   Cancer (HCC) 02/2023   laryngeal cancer   Chronic renal insufficiency 03/21/2006   CrCl <50 ml   DDD (degenerative disc disease), cervical    GERD 03/22/2006   Qualifier: Diagnosis of  By: [name]     GERD (gastroesophageal reflux  disease) 03/22/2006   Hand numbness 10/07/2009   pt states not any longer   Hyperlipidemia 03/22/2008   Hypertension    Hypertension 03/22/2006   Left ventricular hypertrophy 03/22/2006   Leg pain, left 11/15/2009   Lumbar strain 07/15/2008   Renal insufficiency    Soft tissue disorder 08/2008   Tobacco abuse 03/22/2006   2 ppd x 30 years   Significant Hospital Events: Including procedures, antibiotic start and stop dates in addition to other pertinent events   11/19 - Left AKA completed, sustained PEA arrest postoperatively. Intubated. 11/21-in the ICU Has received multiple blood products 11/21-started on CRRT  Interim History / Subjective:  Admitted 11/19 s/p cardiac arrest CPR x 18 min Still remains coagulopathic On CRRT Weaning pressors today  Objective:   Blood pressure (!) 186/82, pulse 89, temperature 99.9 F (37.7 C), temperature source Bladder, resp. rate 18, height 5' 11 (1.803 m), weight 65.4 kg, SpO2 (!) 85%.    Vent Mode: PRVC FiO2 (%):  [50 %-100 %] 50 % Set Rate:  [18 bmp] 18 bmp Vt Set:  [600 mL] 600 mL PEEP:  [14 cmH20-18 cmH20] 15 cmH20 Plateau Pressure:  [28 cmH20-32 cmH20] 28 cmH20   Intake/Output Summary (Last 24 hours) at 01/25/2024 1033 Last data filed at 01/25/2024 1000 Gross per 24 hour  Intake 3148.59 ml  Output 2820.5 ml  Net 328.09 ml   Filed Weights   01/22/24 2200 01/23/24 0500 01/25/24 0500  Weight: 56.1 kg 61.2 kg 65.4 kg   Physical Examination: General: Acutely ill HEENT: Moist oral mucosa, jaundice neuro: Not responsive to noxious stimuli PULM: Coarse breath sounds GI: Soft, bowel sounds appreciated Extremities: New  left AKA with wound VAC in place Skin is warm and dry  I reviewed last 24 h vitals and pain scores, last 48 h intake and output, last 24 h labs and trends, and last 24 h imaging results.  ABG 7.40/26/192 Sodium 131, potassium 5.3 D-dimer greater than 20, fibrinogen  of 244, INR of 2.7, APTT of 63  Resolved  Problem List:    Assessment and Plan:   In-hospital cardiac arrest-PEA s/p CPR and ROSC after 18 minutes - Echocardiogram with severely reduced left ventricular ejection fraction of 25 to 30% with global hypokinesis - Continue monitoring  Hemorrhagic shock Hypovolemic shock - Goal MAP greater than 65 - Was able to wean off vasopressin  - On decreasing doses of Levophed   ARDS -Continue mechanical ventilation per ARDS protocol -Target TVol 6-8cc/kgIBW -Target Plateau Pressure < 30cm H20 -Target driving pressure less than 15 cm of water  -Target PaO2 55-65: titrate PEEP/FiO2 per protocol -Ventilator associated pneumonia prevention protocol  DIC -3 units of RBC - 6 pools of cryoprecipitate - 6 units of FFP - Ordered 4 units FFP today  GI bleed related to DIC - On PPI  Acute kidney injury on chronic kidney disease Severe metabolic acidosis - On CRRT day 2  Shock liver - Continue N-acetylcysteine  - Continue to trend - ALT improved  Possible pneumonia - Empiric pneumonia coverage  Hyperglycemia - Continue SSI - Has required D10, D10 now at 20 cc an hour  Peripheral vascular disease s/p stents S/p AKA left 11/19 Postoperative management per surgery Wound care per surgery   Updated spouse at length 11/21 - Encouraged her to reach out to other family members as patient is very very sick with guarded prognosis  Updated sister at bedside today 11/22  The patient is critically ill with multiple organ systems failure and requires high complexity decision making for assessment and support, frequent evaluation and titration of therapies, application of advanced monitoring technologies and extensive interpretation of multiple databases. Critical Care Time devoted to patient care services described in this note independent of APP/resident time (if applicable)  is 45 minutes.   Jennet Epley MD [facility] Personal pager: See Amion If unanswered,  please page CCM On-call: #[phone]

## 2024-01-25 NOTE — Progress Notes (Addendum)
 eLink Physician-Brief Progress Note Patient Name: William Perkins DOB: [birth year] MRN: [id number]   Date of Service  01/25/2024  HPI/Events of Note  73 year old male that is now in A-fib with RVR, requiring low-dose norepinephrine , has shock liver and kidney injury, DIC-hemoglobin 6.8  eICU Interventions  Will start with PRBC transfusion May need to utilize amiodarone  or digoxin if hemodynamics worsen    2327 - Off epo, may also be contributing to hemodynamics  Intervention Category Intermediate Interventions: Arrhythmia - evaluation and management  Doranne Schmutz 01/25/2024, 11:10 PM

## 2024-01-26 ENCOUNTER — Inpatient Hospital Stay (HOSPITAL_COMMUNITY)

## 2024-01-26 LAB — POCT I-STAT 7, (LYTES, BLD GAS, ICA,H+H)
Acid-base deficit: 11 mmol/L — ABNORMAL HIGH (ref 0.0–2.0)
Acid-base deficit: 13 mmol/L — ABNORMAL HIGH (ref 0.0–2.0)
Acid-base deficit: 16 mmol/L — ABNORMAL HIGH (ref 0.0–2.0)
Acid-base deficit: 17 mmol/L — ABNORMAL HIGH (ref 0.0–2.0)
Acid-base deficit: 17 mmol/L — ABNORMAL HIGH (ref 0.0–2.0)
Acid-base deficit: 7 mmol/L — ABNORMAL HIGH (ref 0.0–2.0)
Acid-base deficit: 8 mmol/L — ABNORMAL HIGH (ref 0.0–2.0)
Acid-base deficit: 9 mmol/L — ABNORMAL HIGH (ref 0.0–2.0)
Bicarbonate: 10.9 mmol/L — ABNORMAL LOW (ref 20.0–28.0)
Bicarbonate: 11 mmol/L — ABNORMAL LOW (ref 20.0–28.0)
Bicarbonate: 11.4 mmol/L — ABNORMAL LOW (ref 20.0–28.0)
Bicarbonate: 15 mmol/L — ABNORMAL LOW (ref 20.0–28.0)
Bicarbonate: 15.9 mmol/L — ABNORMAL LOW (ref 20.0–28.0)
Bicarbonate: 16.9 mmol/L — ABNORMAL LOW (ref 20.0–28.0)
Bicarbonate: 16.9 mmol/L — ABNORMAL LOW (ref 20.0–28.0)
Bicarbonate: 17.6 mmol/L — ABNORMAL LOW (ref 20.0–28.0)
Calcium, Ion: 0.99 mmol/L — ABNORMAL LOW (ref 1.15–1.40)
Calcium, Ion: 1.02 mmol/L — ABNORMAL LOW (ref 1.15–1.40)
Calcium, Ion: 1.02 mmol/L — ABNORMAL LOW (ref 1.15–1.40)
Calcium, Ion: 1.03 mmol/L — ABNORMAL LOW (ref 1.15–1.40)
Calcium, Ion: 1.04 mmol/L — ABNORMAL LOW (ref 1.15–1.40)
Calcium, Ion: 1.04 mmol/L — ABNORMAL LOW (ref 1.15–1.40)
Calcium, Ion: 1.06 mmol/L — ABNORMAL LOW (ref 1.15–1.40)
Calcium, Ion: 1.09 mmol/L — ABNORMAL LOW (ref 1.15–1.40)
HCT: 22 % — ABNORMAL LOW (ref 39.0–52.0)
HCT: 22 % — ABNORMAL LOW (ref 39.0–52.0)
HCT: 23 % — ABNORMAL LOW (ref 39.0–52.0)
HCT: 24 % — ABNORMAL LOW (ref 39.0–52.0)
HCT: 24 % — ABNORMAL LOW (ref 39.0–52.0)
HCT: 24 % — ABNORMAL LOW (ref 39.0–52.0)
HCT: 25 % — ABNORMAL LOW (ref 39.0–52.0)
HCT: 27 % — ABNORMAL LOW (ref 39.0–52.0)
Hemoglobin: 7.5 g/dL — ABNORMAL LOW (ref 13.0–17.0)
Hemoglobin: 7.5 g/dL — ABNORMAL LOW (ref 13.0–17.0)
Hemoglobin: 7.8 g/dL — ABNORMAL LOW (ref 13.0–17.0)
Hemoglobin: 8.2 g/dL — ABNORMAL LOW (ref 13.0–17.0)
Hemoglobin: 8.2 g/dL — ABNORMAL LOW (ref 13.0–17.0)
Hemoglobin: 8.2 g/dL — ABNORMAL LOW (ref 13.0–17.0)
Hemoglobin: 8.5 g/dL — ABNORMAL LOW (ref 13.0–17.0)
Hemoglobin: 9.2 g/dL — ABNORMAL LOW (ref 13.0–17.0)
O2 Saturation: 85 %
O2 Saturation: 86 %
O2 Saturation: 86 %
O2 Saturation: 90 %
O2 Saturation: 90 %
O2 Saturation: 92 %
O2 Saturation: 97 %
O2 Saturation: 98 %
Patient temperature: 36.1
Patient temperature: 36.5
Patient temperature: 36.8
Patient temperature: 37.5
Patient temperature: 99
Patient temperature: 99
Patient temperature: 99.1
Patient temperature: 99.1
Potassium: 4.8 mmol/L (ref 3.5–5.1)
Potassium: 4.8 mmol/L (ref 3.5–5.1)
Potassium: 4.9 mmol/L (ref 3.5–5.1)
Potassium: 5 mmol/L (ref 3.5–5.1)
Potassium: 5 mmol/L (ref 3.5–5.1)
Potassium: 5.3 mmol/L — ABNORMAL HIGH (ref 3.5–5.1)
Potassium: 5.8 mmol/L — ABNORMAL HIGH (ref 3.5–5.1)
Potassium: 6.5 mmol/L (ref 3.5–5.1)
Sodium: 129 mmol/L — ABNORMAL LOW (ref 135–145)
Sodium: 130 mmol/L — ABNORMAL LOW (ref 135–145)
Sodium: 130 mmol/L — ABNORMAL LOW (ref 135–145)
Sodium: 131 mmol/L — ABNORMAL LOW (ref 135–145)
Sodium: 131 mmol/L — ABNORMAL LOW (ref 135–145)
Sodium: 131 mmol/L — ABNORMAL LOW (ref 135–145)
Sodium: 132 mmol/L — ABNORMAL LOW (ref 135–145)
Sodium: 132 mmol/L — ABNORMAL LOW (ref 135–145)
TCO2: 12 mmol/L — ABNORMAL LOW (ref 22–32)
TCO2: 12 mmol/L — ABNORMAL LOW (ref 22–32)
TCO2: 12 mmol/L — ABNORMAL LOW (ref 22–32)
TCO2: 16 mmol/L — ABNORMAL LOW (ref 22–32)
TCO2: 17 mmol/L — ABNORMAL LOW (ref 22–32)
TCO2: 18 mmol/L — ABNORMAL LOW (ref 22–32)
TCO2: 18 mmol/L — ABNORMAL LOW (ref 22–32)
TCO2: 19 mmol/L — ABNORMAL LOW (ref 22–32)
pCO2 arterial: 28.3 mmHg — ABNORMAL LOW (ref 32–48)
pCO2 arterial: 29.6 mmHg — ABNORMAL LOW (ref 32–48)
pCO2 arterial: 31.4 mmHg — ABNORMAL LOW (ref 32–48)
pCO2 arterial: 32.1 mmHg (ref 32–48)
pCO2 arterial: 32.6 mmHg (ref 32–48)
pCO2 arterial: 34.8 mmHg (ref 32–48)
pCO2 arterial: 42.9 mmHg (ref 32–48)
pCO2 arterial: 48.8 mmHg — ABNORMAL HIGH (ref 32–48)
pH, Arterial: 7.123 — CL (ref 7.35–7.45)
pH, Arterial: 7.133 — CL (ref 7.35–7.45)
pH, Arterial: 7.149 — CL (ref 7.35–7.45)
pH, Arterial: 7.154 — CL (ref 7.35–7.45)
pH, Arterial: 7.196 — CL (ref 7.35–7.45)
pH, Arterial: 7.303 — ABNORMAL LOW (ref 7.35–7.45)
pH, Arterial: 7.356 (ref 7.35–7.45)
pH, Arterial: 7.361 (ref 7.35–7.45)
pO2, Arterial: 105 mmHg (ref 83–108)
pO2, Arterial: 66 mmHg — ABNORMAL LOW (ref 83–108)
pO2, Arterial: 67 mmHg — ABNORMAL LOW (ref 83–108)
pO2, Arterial: 69 mmHg — ABNORMAL LOW (ref 83–108)
pO2, Arterial: 71 mmHg — ABNORMAL LOW (ref 83–108)
pO2, Arterial: 72 mmHg — ABNORMAL LOW (ref 83–108)
pO2, Arterial: 74 mmHg — ABNORMAL LOW (ref 83–108)
pO2, Arterial: 94 mmHg (ref 83–108)

## 2024-01-26 LAB — COMPREHENSIVE METABOLIC PANEL WITH GFR
ALT: 105 U/L — ABNORMAL HIGH (ref 0–44)
ALT: 128 U/L — ABNORMAL HIGH (ref 0–44)
AST: 3446 U/L — ABNORMAL HIGH (ref 15–41)
AST: 3511 U/L — ABNORMAL HIGH (ref 15–41)
Albumin: 2 g/dL — ABNORMAL LOW (ref 3.5–5.0)
Albumin: 1.9 g/dL — ABNORMAL LOW (ref 3.5–5.0)
Alkaline Phosphatase: 349 U/L — ABNORMAL HIGH (ref 38–126)
Alkaline Phosphatase: 433 U/L — ABNORMAL HIGH (ref 38–126)
Anion gap: 17 — ABNORMAL HIGH (ref 5–15)
Anion gap: 25 — ABNORMAL HIGH (ref 5–15)
BUN: 18 mg/dL (ref 8–23)
BUN: 15 mg/dL (ref 8–23)
CO2: 19 mmol/L — ABNORMAL LOW (ref 22–32)
CO2: 13 mmol/L — ABNORMAL LOW (ref 22–32)
Calcium: 8.1 mg/dL — ABNORMAL LOW (ref 8.9–10.3)
Calcium: 8.5 mg/dL — ABNORMAL LOW (ref 8.9–10.3)
Chloride: 98 mmol/L (ref 98–111)
Chloride: 95 mmol/L — ABNORMAL LOW (ref 98–111)
Creatinine, Ser: 1.77 mg/dL — ABNORMAL HIGH (ref 0.61–1.24)
Creatinine, Ser: 1.55 mg/dL — ABNORMAL HIGH (ref 0.61–1.24)
GFR, Estimated: 40 mL/min — ABNORMAL LOW (ref >60)
GFR, Estimated: 47 mL/min — ABNORMAL LOW (ref >60)
Glucose, Bld: 118 mg/dL — ABNORMAL HIGH (ref 70–99)
Glucose, Bld: 88 mg/dL (ref 70–99)
Potassium: 4.8 mmol/L (ref 3.5–5.1)
Potassium: 5.1 mmol/L (ref 3.5–5.1)
Sodium: 134 mmol/L — ABNORMAL LOW (ref 135–145)
Sodium: 133 mmol/L — ABNORMAL LOW (ref 135–145)
Total Bilirubin: 5.4 mg/dL — ABNORMAL HIGH (ref 0.0–1.2)
Total Bilirubin: 6.2 mg/dL — ABNORMAL HIGH (ref 0.0–1.2)
Total Protein: 4.4 g/dL — ABNORMAL LOW (ref 6.5–8.1)
Total Protein: 4.4 g/dL — ABNORMAL LOW (ref 6.5–8.1)

## 2024-01-26 LAB — HEPATIC FUNCTION PANEL
ALT: 101 U/L — ABNORMAL HIGH (ref 0–44)
AST: 3391 U/L — ABNORMAL HIGH (ref 15–41)
Albumin: 2 g/dL — ABNORMAL LOW (ref 3.5–5.0)
Alkaline Phosphatase: 350 U/L — ABNORMAL HIGH (ref 38–126)
Bilirubin, Direct: 3.4 mg/dL — ABNORMAL HIGH (ref 0.0–0.2)
Indirect Bilirubin: 1.7 mg/dL — ABNORMAL HIGH (ref 0.3–0.9)
Total Bilirubin: 5.1 mg/dL — ABNORMAL HIGH (ref 0.0–1.2)
Total Protein: >12 g/dL — ABNORMAL HIGH (ref 6.5–8.1)

## 2024-01-26 LAB — PREPARE FRESH FROZEN PLASMA: Unit division: 0

## 2024-01-26 LAB — TYPE AND SCREEN
ABO/RH(D): B POS
Antibody Screen: NEGATIVE
Unit division: 0
Unit division: 0
Unit division: 0
Unit division: 0

## 2024-01-26 LAB — GLUCOSE, CAPILLARY
Glucose-Capillary: 83 mg/dL (ref 70–99)
Glucose-Capillary: 110 mg/dL — ABNORMAL HIGH (ref 70–99)
Glucose-Capillary: <10 mg/dL — CL (ref 70–99)
Glucose-Capillary: 102 mg/dL — ABNORMAL HIGH (ref 70–99)
Glucose-Capillary: 74 mg/dL (ref 70–99)
Glucose-Capillary: 80 mg/dL (ref 70–99)

## 2024-01-26 LAB — BPAM RBC
Blood Product Expiration Date: 202511252359
Blood Product Expiration Date: 202511262359
Blood Product Expiration Date: 202511262359
Blood Product Expiration Date: 202511292359
ISSUE DATE / TIME: 202511200215
ISSUE DATE / TIME: 202511200215
ISSUE DATE / TIME: 202511210620
ISSUE DATE / TIME: 202511222328
Unit Type and Rh: 7300
Unit Type and Rh: 7300
Unit Type and Rh: 7300
Unit Type and Rh: 7300

## 2024-01-26 LAB — BPAM FFP
Blood Product Expiration Date: 202511272359
Blood Product Expiration Date: 202511272359
Blood Product Expiration Date: 202511272359
Blood Product Expiration Date: 202511272359
ISSUE DATE / TIME: 202511220957
ISSUE DATE / TIME: 202511220957
ISSUE DATE / TIME: 202511220957
ISSUE DATE / TIME: 202511220957
Unit Type and Rh: 7300
Unit Type and Rh: 7300
Unit Type and Rh: 7300
Unit Type and Rh: 7300

## 2024-01-26 LAB — DIC (DISSEMINATED INTRAVASCULAR COAGULATION)PANEL
D-Dimer, Quant: >20 ug{FEU}/mL — ABNORMAL HIGH (ref 0.00–0.50)
Fibrinogen: 234 mg/dL (ref 210–475)
INR: 3 — ABNORMAL HIGH (ref 0.8–1.2)
Platelets: 56 K/uL — ABNORMAL LOW (ref 150–400)
Prothrombin Time: 32.6 s — ABNORMAL HIGH (ref 11.4–15.2)
aPTT: 68 s — ABNORMAL HIGH (ref 24–36)

## 2024-01-26 LAB — PHOSPHORUS
Phosphorus: 4.8 mg/dL — ABNORMAL HIGH (ref 2.5–4.6)
Phosphorus: 5.7 mg/dL — ABNORMAL HIGH (ref 2.5–4.6)

## 2024-01-26 LAB — MAGNESIUM: Magnesium: 2.1 mg/dL (ref 1.7–2.4)

## 2024-01-26 MED ORDER — VASOPRESSIN 20 UNITS/100 ML INFUSION FOR SHOCK
0.0000 [IU]/min | INTRAVENOUS | Status: DC
  Administered 2024-01-26: 0.03 [IU]/min via INTRAVENOUS
  Filled 2024-01-26: qty 100

## 2024-01-26 MED ORDER — STERILE WATER FOR INJECTION IV SOLN
INTRAVENOUS | Status: DC
  Filled 2024-01-26 (×2): qty 1000
  Filled 2024-01-26: qty 150

## 2024-01-26 MED ORDER — PRISMASOL BGK 0/2.5 32-2.5 MEQ/L EC SOLN: Status: DC

## 2024-01-26 MED ORDER — AMIODARONE HCL IN DEXTROSE 360-4.14 MG/200ML-% IV SOLN
60.0000 mg/h | INTRAVENOUS | Status: AC
  Administered 2024-01-26 (×2): 60 mg/h via INTRAVENOUS
  Filled 2024-01-26 (×2): qty 200

## 2024-01-26 MED ORDER — AMIODARONE HCL IN DEXTROSE 360-4.14 MG/200ML-% IV SOLN
30.0000 mg/h | INTRAVENOUS | Status: DC
  Administered 2024-01-26: 30 mg/h via INTRAVENOUS
  Filled 2024-01-26: qty 200

## 2024-01-26 MED ORDER — SODIUM BICARBONATE 8.4 % IV SOLN
50.0000 meq | Freq: Once | INTRAVENOUS | Status: AC
  Administered 2024-01-26: 50 meq via INTRAVENOUS
  Filled 2024-01-26: qty 50

## 2024-01-26 MED ORDER — AMIODARONE LOAD VIA INFUSION
150.0000 mg | Freq: Once | INTRAVENOUS | Status: AC
  Administered 2024-01-26: 150 mg via INTRAVENOUS
  Filled 2024-01-26: qty 83.34

## 2024-01-26 MED ORDER — VASOPRESSIN 20 UNITS/100 ML INFUSION FOR SHOCK
INTRAVENOUS | Status: AC
  Administered 2024-01-26: 0.03 [IU]/min via INTRAVENOUS
  Filled 2024-01-26: qty 100

## 2024-01-26 MED ORDER — EPINEPHRINE HCL 5 MG/250ML IV SOLN IN NS: 0.5000 ug/min | INTRAVENOUS | Status: DC

## 2024-01-26 MED ORDER — EPINEPHRINE HCL 5 MG/250ML IV SOLN IN NS
INTRAVENOUS | Status: AC
Start: 2024-01-26 — End: 2024-01-26
  Administered 2024-01-26: 0.5 ug/min via INTRAVENOUS
  Filled 2024-01-26: qty 250

## 2024-01-26 MED ORDER — METOCLOPRAMIDE HCL 5 MG/5ML PO SOLN
5.0000 mg | Freq: Three times a day (TID) | ORAL | Status: DC
  Administered 2024-01-26: 5 mg
  Filled 2024-01-26 (×3): qty 10

## 2024-01-26 MED ORDER — AMIODARONE IV BOLUS ONLY 150 MG/100ML: 150.0000 mg | Freq: Once | INTRAVENOUS | Status: DC

## 2024-01-27 ENCOUNTER — Ambulatory Visit (INDEPENDENT_AMBULATORY_CARE_PROVIDER_SITE_OTHER): Admitting: Otolaryngology

## 2024-01-27 LAB — POTASSIUM: Potassium: 6.3 mmol/L (ref 3.5–5.1)

## 2024-01-27 LAB — CALCIUM, IONIZED: Calcium, Ionized, Serum: 4.3 mg/dL — ABNORMAL LOW (ref 4.5–5.6)

## 2024-01-27 LAB — SURGICAL PATHOLOGY

## 2024-01-28 LAB — CALCIUM, IONIZED: Calcium, Ionized, Serum: 4.2 mg/dL — ABNORMAL LOW (ref 4.5–5.6)

## 2024-02-03 NOTE — Death Summary Note (Signed)
 DEATH SUMMARY   Patient Details  Name: William Perkins MRN: [id number] DOB: 1951/01/17  Admission/Discharge Information   Admit Date:  02/13/24  Date of Death: Date of Death: 2024-02-17  Time of Death: Time of Death: 02/25/2331  Length of Stay: 5  Referring Physician: Rosan Dayton JAYSON, DO   Reason(s) for Hospitalization  Admitted following left above-knee amputation  Diagnoses  Preliminary cause of death:  Disseminated intravascular coagulopathy Secondary Diagnoses (including complications and co-morbidities):  Principal Problem:   S/P AKA (above knee amputation) unilateral, left (HCC) Active Problems:   Non-healing wound of lower extremity   Status post above knee amputation, left (HCC)   Cardiac arrest (HCC)   Shock liver   Elevated liver enzymes   Hematemesis   ABLA (acute blood loss anemia)   Acute on chronic anemia Acute kidney injury  Brief Hospital Course (including significant findings, care, treatment, and services provided and events leading to death)  William Perkins is a 73 y.o. year old male who admitted following left above-knee amputation. 73 year old with peripheral vascular disease had an above-knee amputation.  Postoperatively patient noted to be very drowsy and became bradycardic.  He went into PEA cardiac arrest, CPR lasted 18 minutes before ROSC was achieved.  Transferred to the ICU started on 2 pressors.  He had vomited some blood prior to his intubation.  Had a bronchoscopy performed which did not show any evidence of acute bleeding but did show blood in the airway.  Was felt that he had a GI bleed and aspirated.  Hemoglobin of 6.2 had 2 units of blood. Blood work reveals coagulopathy with labs suggestive of disseminated intravascular coagulopathy.  Received fibrinogen  and FFP.  Noted to have acute kidney injury, renal service was consulted and CRRT was initiated.  Patient in ARDS with severe hypoxemia, Veletri  was initiated.  GI consultation-his GI bleed was felt  to be related to his DIC-continue correcting coagulopathy. Received multiple blood products, continued CRRT.  With difficulty achieving adequate ventilation,attempt at proning patient was aborted at this patient's saturation continued to deteriorate despite proning.  Was started on steroids, paralytics, antibiotics continued Patient did show some period of stability, no evidence of ongoing bleeding,was weaned off Veletri . On 11/22 went into A-fib with RVR, required another unit of blood started on amiodarone  On Feb 17, 2024 he unfortunately continued to deteriorate with severe acidemia that was not correctable with ongoing CRRT and bicarb drip.  Goals of care discussions with family ongoing.  As patient continued to deteriorate further discussions with family led to him being transition to DNR status.  Discussions about comfort measures was also had but prior to being able to initiate this, patient went into nonsustained V. tach and expired.  Time of death 25-Feb-2331  Disseminated intravascular coagulopathy   Pertinent Labs and Studies  Significant Diagnostic Studies DG Chest Port 1 View Result Date: 02/17/24 EXAM: 1 VIEW(S) XRAY OF THE CHEST 02-17-24 07:13:00 AM COMPARISON: 01/24/2024 CLINICAL HISTORY: Respiratory failure (HCC) FINDINGS: LINES, TUBES AND DEVICES: Endotracheal tube in place with tip 5 cm above the carina, stable. Enteric tube in place with tip and side port projecting over proximal stomach, stable. Stable left IJ CVC. Right IJ CVC with tip terminating over superior cavoatrial junction, stable. LUNGS AND PLEURA: Stable perihilar vascular fullness and bibasilar opacities, likely representing layering pleural effusions. Stable perihilar interstitial markings. No pneumothorax. HEART AND MEDIASTINUM: Atherosclerotic calcifications noted. No acute abnormality of the cardiac and mediastinal silhouettes. BONES AND SOFT TISSUES: No acute osseous abnormality. IMPRESSION: 1.  Persistent multifocal  airspace opacities within the perihilar right lung. 2. Bilateral posterior layering pleural effusions with vein-like opacification over both lungs , similar. Electronically signed by: Waddell Calk MD 01/21/2024 09:24 AM EST RP Workstation: [id number]   DG CHEST PORT 1 VIEW Result Date: 01/24/2024 EXAM: 1 VIEW(S) XRAY OF THE CHEST 01/24/2024 01:09:00 PM COMPARISON: 01/24/2024 CLINICAL HISTORY: Endotracheal tube present FINDINGS: LINES, TUBES AND DEVICES: The endotracheal tube is noted in a satisfactory position. Bilateral jugular central lines are again seen and stable. A gastric catheter shows the tip in the stomach, although the proximal side port lies in the distal esophagus. This should be advanced deeper into the stomach. LUNGS AND PLEURA: Persistent increased perihilar density is noted with slight worsening consistent with progressive edema . Bilateral pleural effusions, right greater than left, are again seen. No pneumothorax. HEART AND MEDIASTINUM: No acute abnormality of the cardiac and mediastinal silhouettes. BONES AND SOFT TISSUES: No acute osseous abnormality. IMPRESSION: 1. Slight worsening of perihilar opacities consistent with progressive edema 2. Bilateral pleural effusions, right greater than left. 3. Gastric catheter tip projects over the stomach with proximal side port in the distal esophagus; recommend advancing the catheter deeper into the stomach. Electronically signed by: [name] 01/24/2024 02:23 PM EST RP Workstation: [id number]   DG Chest Port 1 View Result Date: 01/24/2024 EXAM: 1 VIEW(S) XRAY OF THE CHEST 01/24/2024 10:24:00 AM COMPARISON: 01/24/2024 CLINICAL HISTORY: Hypoxia FINDINGS: LINES, TUBES AND DEVICES: ETT in place with tip 6 cm above the carina. Enteric tube in place with tip over stomach. The proximal side hole lies in the vicinity of the gastroesophageal junction. This should be advanced deeper into the stomach. Left chest CVC in place with tip in distal SVC.  Right chest CVC in place with tip in mid SVC. LUNGS AND PLEURA: Bilateral pleural effusions. Increased perihilar density is noted bilaterally consistent with edema. No pneumothorax. HEART AND MEDIASTINUM: Atherosclerotic calcifications. BONES AND SOFT TISSUES: No acute osseous abnormality. IMPRESSION: 1. Bilateral pleural effusions with increased perihilar opacities consistent with pulmonary edema. 2. Enteric tube side hole projects near the gastroesophageal junction; recommend advancing the tube further into the stomach. Electronically signed by: [name] 01/24/2024 02:21 PM EST RP Workstation: [id number]   DG CHEST PORT 1 VIEW Result Date: 01/24/2024 CLINICAL DATA:  Hypoxia EXAM: PORTABLE CHEST 1 VIEW COMPARISON:  Chest radiograph dated 01/23/2024 FINDINGS: Lines/tubes: Endotracheal tube tip projects 6.0 cm above the carina. Gastric/enteric tube tip projects over the stomach. Right internal jugular venous catheter tip projects over the SVC. Metal tipped esophageal temperature probe terminates over the upper esophagus. Lungs: Increased diffuse perihilar and lower lung hazy and interstitial opacities. Pleura: Likely layering bilateral pleural effusions. No pneumothorax. Heart/mediastinum: The heart size and mediastinal contours are within normal limits. Bones: No acute osseous abnormality. IMPRESSION: 1. Increased diffuse perihilar and lower lung hazy and interstitial opacities, likely pulmonary edema. Aspiration or pneumonia can be considered in the appropriate clinical setting. 2. Likely layering bilateral pleural effusions. 3. Support apparatus as described. Electronically Signed   By: [name]   On: 01/24/2024 08:16   VAS US  LOWER EXTREMITY VENOUS (DVT) Result Date: 01/23/2024  Lower Venous DVT Study Patient Name:  DEVRON COHICK  Date of Exam:   01/23/2024 Medical Rec #: [id number]       Accession #:    [id number] Date of Birth: Jun 09, 1950       Patient Gender: M Patient Age:   32 years Exam  Location:  [facility] Procedure:      VAS US  LOWER EXTREMITY VENOUS (DVT) Referring Phys: STEPHANIE REESE --------------------------------------------------------------------------------  Indications: Status post left AKA complicated by post operative cardiac arrest. Question DIC.  Limitations: Bandage, ventilation, shadowing from arterial plaque. Comparison Study: No prior LEV on file Performing Technologist: Alberta Lis RVS  Examination Guidelines: A complete evaluation includes B-mode imaging, spectral Doppler, color Doppler, and power Doppler as needed of all accessible portions of each vessel. Bilateral testing is considered an integral part of a complete examination. Limited examinations for reoccurring indications may be performed as noted. The reflux portion of the exam is performed with the patient in reverse Trendelenburg.  +---------+---------------+---------+-----------+----------+--------------+ RIGHT    CompressibilityPhasicitySpontaneityPropertiesThrombus Aging +---------+---------------+---------+-----------+----------+--------------+ CFV      Full           Yes      No                                  +---------+---------------+---------+-----------+----------+--------------+ SFJ      Full                                                        +---------+---------------+---------+-----------+----------+--------------+ FV Prox  Full           Yes      No                                  +---------+---------------+---------+-----------+----------+--------------+ FV Mid   Full                                                        +---------+---------------+---------+-----------+----------+--------------+ FV DistalFull                                                        +---------+---------------+---------+-----------+----------+--------------+ PFV      Full           Yes      No                                   +---------+---------------+---------+-----------+----------+--------------+ POP      Full           Yes      No                                  +---------+---------------+---------+-----------+----------+--------------+ PTV      Full                                                        +---------+---------------+---------+-----------+----------+--------------+ PERO  Full                                                        +---------+---------------+---------+-----------+----------+--------------+   +---------+---------------+---------+-----------+----------+--------------+ LEFT     CompressibilityPhasicitySpontaneityPropertiesThrombus Aging +---------+---------------+---------+-----------+----------+--------------+ CFV      Full           Yes      No                                  +---------+---------------+---------+-----------+----------+--------------+ SFJ      Full                                                        +---------+---------------+---------+-----------+----------+--------------+ FV Prox  Full           Yes      No                                  +---------+---------------+---------+-----------+----------+--------------+ FV Mid   Full           Yes      Yes                                 +---------+---------------+---------+-----------+----------+--------------+ FV Distal                                             bandage        +---------+---------------+---------+-----------+----------+--------------+ PFV      Full           Yes      No                                  +---------+---------------+---------+-----------+----------+--------------+ POP                                                   AKA            +---------+---------------+---------+-----------+----------+--------------+     Summary: RIGHT: - There is no evidence of deep vein thrombosis in the lower extremity.  - No cystic structure found in  the popliteal fossa.  LEFT: - There is no evidence of deep vein thrombosis in the visualized veins of the left lower extremity.  *See table(s) above for measurements and observations. Electronically signed by Debby Robertson on 01/23/2024 at 8:02:29 PM.    Final    ECHOCARDIOGRAM COMPLETE Result Date: 01/23/2024    ECHOCARDIOGRAM REPORT   Patient Name:   TAYON PAREKH Date of Exam: 01/23/2024 Medical Rec #:  [id number]      Height:       71.0 in Accession #:    [id number]  Weight:       134.9 lb Date of Birth:  01-31-1951      BSA:          1.784 m Patient Age:    73 years       BP:           0/0 mmHg Patient Gender: M              HR:           91 bpm. Exam Location:  Inpatient Procedure: 2D Echo, Cardiac Doppler, Color Doppler and Intracardiac            Opacification Agent (Both Spectral and Color Flow Doppler were            utilized during procedure). Indications:    Cardiac Arrest 146.9  History:        Patient has no prior history of Echocardiogram examinations. PAD                 and Carotid Disease, Signs/Symptoms:Murmur; Risk                 Factors:Diabetes, Dyslipidemia and Former Smoker.  Sonographer:    Merlynn Argyle Referring Phys: [id number] [facility]  Sonographer Comments: Suboptimal parasternal window and echo performed with patient supine and on artificial respirator. IMPRESSIONS  1. Left ventricular ejection fraction, by estimation, is 25 to 30%. The left ventricle has severely decreased function. The left ventricle demonstrates global hypokinesis. There is mild left ventricular hypertrophy. Left ventricular diastolic parameters  are indeterminate.  2. Right ventricular systolic function is mildly reduced. The right ventricular size is normal.  3. The mitral valve is degenerative. Mild mitral valve regurgitation. No evidence of mitral stenosis.  4. The aortic valve is abnormal. There is severe calcifcation of the aortic valve. Aortic valve regurgitation is not visualized. Mild aortic valve  stenosis. Aortic valve area, by VTI measures 1.15 cm. Aortic valve mean gradient measures 10.0 mmHg. Aortic valve Vmax measures 2.21 m/s. Valve is severely calcifed and SVI is low 18. However, DVI is 0.51. Concern for low flow low gradient at least mild-moderate AS. FINDINGS  Left Ventricle: Left ventricular ejection fraction, by estimation, is 25 to 30%. The left ventricle has severely decreased function. The left ventricle demonstrates global hypokinesis. Definity  contrast agent was given IV to delineate the left ventricular endocardial borders. The left ventricular internal cavity size was normal in size. There is mild left ventricular hypertrophy. Left ventricular diastolic parameters are indeterminate. Right Ventricle: The right ventricular size is normal. Right vetricular wall thickness was not well visualized. Right ventricular systolic function is mildly reduced. Left Atrium: Left atrial size was normal in size. Right Atrium: Right atrial size was normal in size. Pericardium: There is no evidence of pericardial effusion. Mitral Valve: The mitral valve is degenerative in appearance. Mild mitral annular calcification. Mild mitral valve regurgitation. No evidence of mitral valve stenosis. Tricuspid Valve: The tricuspid valve is normal in structure. Tricuspid valve regurgitation is trivial. No evidence of tricuspid stenosis. Aortic Valve: The aortic valve is abnormal. There is severe calcifcation of the aortic valve. Aortic valve regurgitation is not visualized. Mild aortic stenosis is present. Aortic valve mean gradient measures 10.0 mmHg. Aortic valve peak gradient measures 19.5 mmHg. Aortic valve area, by VTI measures 1.15 cm. Pulmonic Valve: The pulmonic valve was grossly normal. Pulmonic valve regurgitation is trivial. Aorta: The ascending aorta was not well visualized. Venous: The inferior vena cava was not well visualized. IVC  assessment for right atrial pressure unable to be performed due to mechanical  ventilation. IAS/Shunts: No atrial level shunt detected by color flow Doppler.  LEFT VENTRICLE PLAX 2D LVIDd:         4.10 cm   Diastology LVIDs:         3.30 cm   LV e' medial:    3.65 cm/s LV PW:         1.20 cm   LV E/e' medial:  25.0 LV IVS:        1.10 cm   LV e' lateral:   6.32 cm/s LVOT diam:     1.70 cm   LV E/e' lateral: 14.4 LV SV:         33 LV SV Index:   18 LVOT Area:     2.27 cm  RIGHT VENTRICLE RV S prime:     7.78 cm/s TAPSE (M-mode): 1.3 cm LEFT ATRIUM             Index LA diam:        3.50 cm 1.96 cm/m LA Vol (A2C):   45.4 ml 25.45 ml/m LA Vol (A4C):   22.7 ml 12.73 ml/m LA Biplane Vol: 35.6 ml 19.96 ml/m  AORTIC VALVE AV Area (Vmax):    0.83 cm AV Area (Vmean):   0.84 cm AV Area (VTI):     1.15 cm AV Vmax:           221.00 cm/s AV Vmean:          136.000 cm/s AV VTI:            0.283 m AV Peak Grad:      19.5 mmHg AV Mean Grad:      10.0 mmHg LVOT Vmax:         80.40 cm/s LVOT Vmean:        50.100 cm/s LVOT VTI:          0.144 m LVOT/AV VTI ratio: 0.51  AORTA Ao Root diam: 3.10 cm MITRAL VALVE MV Area (PHT): 4.74 cm    SHUNTS MV Decel Time: 160 msec    Systemic VTI:  0.14 m MR Peak grad: 66.9 mmHg    Systemic Diam: 1.70 cm MR Mean grad: 41.0 mmHg MR Vmax:      409.00 cm/s MR Vmean:     305.0 cm/s MV E velocity: 91.30 cm/s MV A velocity: 79.80 cm/s MV E/A ratio:  1.14 Soyla Merck MD Electronically signed by Soyla Merck MD Signature Date/Time: 01/23/2024/4:12:08 PM    Final    DG Chest Port 1 View Result Date: 01/23/2024 CLINICAL DATA:  Hypoxia. EXAM: PORTABLE CHEST 1 VIEW COMPARISON:  01/22/2024 FINDINGS: Endotracheal tube has tip approximately 6.6 cm above the carina. Nasogastric tube courses into the region of the stomach and off the image as tip is not visualized. Right IJ central venous catheter has tip over the SVC. Lungs are adequately inflated demonstrate continued moderate bilateral central perihilar opacification right worse than left likely asymmetric interstitial  edema. Interval worsening opacification over the right midlung. Infection is possible. No significant effusion. Cardiomediastinal silhouette and remainder of the exam is unchanged. IMPRESSION: 1. Continued moderate bilateral central perihilar opacification right worse than left likely asymmetric interstitial edema. Interval worsening opacification over the right midlung which may be due to asymmetric edema or infection. 2. Tubes and lines as described. Electronically Signed   By: Toribio Agreste M.D.   On: 01/23/2024 13:08   CT ABDOMEN PELVIS W CONTRAST  Result Date: 01/23/2024 EXAM: CT ABDOMEN AND PELVIS WITH CONTRAST 01/23/2024 04:58:32 AM TECHNIQUE: CT of the abdomen and pelvis was performed with the administration of 75 mL of iohexol  (OMNIPAQUE ) 350 MG/ML injection. Multiplanar reformatted images are provided for review. Automated exposure control, iterative reconstruction, and/or weight-based adjustment of the mA/kV was utilized to reduce the radiation dose to as low as reasonably achievable. COMPARISON: CTA chest reported separately today. CLINICAL HISTORY: 73 year old male with PEA cardiac arrest, CPR, and status post lower extremity amputation. FINDINGS: LOWER CHEST: CTA chest today is reported separately. LIVER: Periportal edema in the liver. GALLBLADDER AND BILE DUCTS: Generalized gallbladder wall edema or pericholecystic fluid measuring 10 mm in thickness (coronal image 62). The gallbladder is nondilated. No biliary ductal dilatation is evident. SPLEEN: Diminutive spleen. PANCREAS: No acute abnormality. ADRENAL GLANDS: No acute abnormality. KIDNEYS, URETERS AND BLADDER: On the delayed images there is symmetric renal enhancement but no renal contrast excretion. Nondilated renal collecting systems and ureters. A foley catheter decompresses the urinary bladder. No stones in the kidneys or ureters. No hydronephrosis. No perinephric or periureteral stranding. Similar severe atherosclerosis of the right main  renal artery. GI AND BOWEL: Enteric tube terminates in the decompressed stomach. Diffusely fluid containing and mildly dilated small bowel loops from the duodenum to the ileum with no abrupt transition. Normal gas containing appendix on series 7 image 44. Small volume free fluid in the abdomen, including in the right lower quadrant. Bulky retained stool in the rectosigmoid colon (series 8 image 67) with a 7.5 cm diameter rectal stool ball. Upstream large bowel retained gas and stool but decompressed descending colon. Redundant partially decompressed transverse colon. No pneumoperitoneum identified. Shock bowel. PERITONEUM AND RETROPERITONEUM: Small volume free fluid in the abdomen, including in the right lower quadrant. No free air. VASCULATURE: Diffuse severe calcified atherosclerosis. 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 Similar severe atherosclerosis of the right main renal artery. Major arterial structures in the abdomen and pelvis remain grossly patent. Portal venous system and IVC are patent. Postoperative changes are noted to the bilateral common femoral vessels which remain grossly patent. There is an asymmetric appearance of the left femoral vein on series 4 image 95 but could be mixing artifact rather than venous thrombosis. The iliac veins and IVC are patent. LYMPH NODES: No lymphadenopathy. REPRODUCTIVE ORGANS: No acute abnormality. BONES AND SOFT TISSUES: L4-L5 chronic postoperative decompression and fusion changes. Other spine and pelvis degeneration. CPR related anterior lower rib fractures redemonstrated. No focal soft tissue abnormality. IMPRESSION: 1. Shock bowel appearance of diffusely fluid filled small bowel loops, with No abrupt transition. 2. Hepatic periportal edema, small volume ascites, and diffuse gallbladder edema vs pericholecystic fluid - also likely in keeping with Shock. 3. And absent renal contrast excretion without obstructive  uropathy, also compatible with intrinsic renal insufficiency and/or Shock. 4. Very severe atherosclerosis, but no large artery occlusion in the abdomen or pelvis. Probable mixing artifact rather than DVT in the partially visible left femoral vein. 5. Bulky retained stool in the rectosigmoid colon, consider fecal impaction. 6. Satisfactory enteric tube and foley catheter. 7. CTA Chest reported separately. Electronically signed by: Helayne Hurst MD 01/23/2024 05:48 AM EST RP Workstation: [id number]   CT Angio Chest Pulmonary Embolism (PE) W or WO Contrast Result Date: 01/23/2024 EXAM: CTA CHEST AORTA 01/23/2024 04:58:32 AM TECHNIQUE: CTA of the chest was performed after the administration of 75 mL of iohexol  (OMNIPAQUE ) 350 MG/ML injection. Multiplanar reformatted images are provided for  review. MIP images are provided for review. Automated exposure control, iterative reconstruction, and/or weight based adjustment of the mA/kV was utilized to reduce the radiation dose to as low as reasonably achievable. COMPARISON: Chest CT without contrast 10/24/2023. CT abdomen and pelvis today is reported separately. CLINICAL HISTORY: 73 year old male with PEA cardiac arrest, CPR, and lower extremity amputation. Pulmonary embolism suspected, high probability. FINDINGS: AORTA: There is severe calcified atherosclerosis of the visible aorta and coronary arteries (series 8 image 240, series 8 image 433). No contrast in the aorta on this exam. No thoracic aortic dissection. No aneurysm. MEDIASTINUM: Excellent pulmonary artery contrast timing. Heart size remains within normal limits. No pericardial effusion. Minimal contrast in the left heart. No mediastinal lymphadenopathy. Intubated. Endotracheal tube tip in good position between the clavicles and carina. Enteric tube courses in the esophagus to the abdomen. LYMPH NODES: No mediastinal, hilar or axillary lymphadenopathy. LUNGS AND PLEURA: Widespread bilateral dependent pulmonary  consolidation. Early peribronchial opacity and consolidation in the middle lobes. Bilateral lower lobe air bronchograms. Only small or trace superimposed pleural effusions. No pneumothorax. UPPER ABDOMEN: CT abdomen and pelvis today is reported separately. SOFT TISSUES AND BONES: CPR related anterior rib fractures which are mostly nondisplaced or minimally displaced (right anterior 5th rib series 7 image 91). However, the left anterior 6th rib fracture near the costochondral junction on series 7 image 108 is displaced 1 half shaft width. No sternal fracture identified. Stable thoracic spine degeneration, hyperostosis, T3 benign vertebral hemangioma. IMPRESSION: 1. No acute pulmonary embolism. 2. Extensive bilateral dependent lung consolidation, such as aspiration and/or pneumonia, with developing bronchopneumonia in the middle lobes. Only trace pleural effusion(s). 3. CPR related  anterior rib fractures.  Satisfactory ETT, visible enteric tube. 4. Very Severe calcified atherosclerosis. 5. CT abdomen and pelvis today reported separately. Electronically signed by: Helayne Hurst MD 01/23/2024 05:37 AM EST RP Workstation: [id number]   CT HEAD WO CONTRAST ( ) Result Date: 01/23/2024 EXAM: CT HEAD WITHOUT CONTRAST 01/23/2024 04:58:32 AM TECHNIQUE: CT of the head was performed without the administration of intravenous contrast. Automated exposure control, iterative reconstruction, and/or weight based adjustment of the mA/kV was utilized to reduce the radiation dose to as low as reasonably achievable. COMPARISON: Brain MRI 03/16/2006. CLINICAL HISTORY: 73 year old male. PEA cardiac arrest and CPR status post lower extremity amputation. FINDINGS: BRAIN AND VENTRICLES: Decreased brain volume since 2008 appears to be within normal limits for age. No acute hemorrhage. No evidence of acute infarct. No hydrocephalus. No extra-axial collection. No mass effect or midline shift. Patchy and confluent periventricular and cerebral  white matter hypodensity appears chronic and similar to the previous MRI. Otherwise maintained gray white differentiation. Maintained cerebral sulci. Small basal ganglia vascular calcifications. Incidental dural calcifications. No suspicious intracranial vascular hyperdensity. ORBITS: No acute abnormality. SINUSES: Fluid in the paranasal sinuses. Middle ears and mastoids are well aerated. SOFT TISSUES AND SKULL: Intubated on the scout view. Fluid in the pharynx. No acute soft tissue abnormality. No skull fracture. IMPRESSION: 1. No CT evidence of cerebral anoxic injury. Chronic cerebral white matter disease appearing similar to a 2008 MRI. 2. Intubated.  No acute intracranial abnormality identified. Electronically signed by: Helayne Hurst MD 01/23/2024 05:30 AM EST RP Workstation: [id number]   DG Abd 1 View Result Date: 01/23/2024 EXAM: 1 VIEW XRAY OF THE ABDOMEN 01/23/2024 12:08:00 AM COMPARISON: None available. CLINICAL HISTORY: [id number] Encounter for imaging study to confirm orogastric (OG) tube placement [phone] Encounter for imaging study to confirm orogastric (OG) tube placement FINDINGS: LINES, TUBES AND DEVICES:  Endotracheal tube terminates 5.5 cm. Right internal jugular central venous catheter. Enteric tube courses below the hemidiaphragm with tip overlying the gastric lumen and side-port overlying the gastroesophageal junction. LUNGS: Diffuse fat-like interstitial and airspace opacities, most prominent along the right lower lobe. CHEST: Stable cardiomediastinal silhouette. BOWEL: Gaseous distention of the bowel in the upper abdomen. SOFT TISSUES: No opaque urinary calculi. BONES: No acute osseous abnormality. LIMITATIONS: Lower abdomen and pelvis collimated off view. IMPRESSION: 1. Enteric tube tip overlies the gastric lumen with side-port at the gastroesophageal junction. Consider advancing by 2cm. 2. Diffuse interstitial and airspace opacities, most prominent along the right lower lobe. Finding may  represent a combination of edema, infection, inflammation. 3. Gaseous distention of bowel in the upper abdomen, with lower abdomen and pelvis not visualized due to collimation. Electronically signed by: Morgane Naveau MD 01/23/2024 12:19 AM EST RP Workstation: [id number]   DG CHEST PORT 1 VIEW Result Date: 01/23/2024 EXAM: 1 VIEW XRAY OF THE ABDOMEN 01/23/2024 12:08:00 AM COMPARISON: None available. CLINICAL HISTORY: [id number] Encounter for imaging study to confirm orogastric (OG) tube placement [phone] Encounter for imaging study to confirm orogastric (OG) tube placement FINDINGS: LINES, TUBES AND DEVICES: Endotracheal tube terminates 5.5 cm. Right internal jugular central venous catheter. Enteric tube courses below the hemidiaphragm with tip overlying the gastric lumen and side-port overlying the gastroesophageal junction. LUNGS: Diffuse fat-like interstitial and airspace opacities, most prominent along the right lower lobe. CHEST: Stable cardiomediastinal silhouette. BOWEL: Gaseous distention of the bowel in the upper abdomen. SOFT TISSUES: No opaque urinary calculi. BONES: No acute osseous abnormality. LIMITATIONS: Lower abdomen and pelvis collimated off view. IMPRESSION: 1. Enteric tube tip overlies the gastric lumen with side-port at the gastroesophageal junction. Consider advancing by 2cm. 2. Diffuse interstitial and airspace opacities, most prominent along the right lower lobe. Finding may represent a combination of edema, infection, inflammation. 3. Gaseous distention of bowel in the upper abdomen, with lower abdomen and pelvis not visualized due to collimation. Electronically signed by: Morgane Naveau MD 01/23/2024 12:19 AM EST RP Workstation: [id number]   PERIPHERAL VASCULAR CATHETERIZATION Result Date: 01/20/2024 Table formatting from the original result was not included. Images from the original result were not included.  DATE OF SERVICE: 01/17/2024  PATIENT:  RAESHAWN VO  73 y.o. male   PRE-OPERATIVE DIAGNOSIS:  Atherosclerosis of native arteries of left lower extremity causing ulceration  POST-OPERATIVE DIAGNOSIS:  Same  PROCEDURE:  1) Ultrasound guided right common femoral artery access (CPT [phone]) 2) Left lower extremity angiogram with second order cannulation (CPT 36246) 3) Conscious sedation (52 minutes) (CPT 99152)  SURGEON:  Debby SAILOR. Magda, MD  ASSISTANT: none  ANESTHESIA:   local and IV sedation  ESTIMATED BLOOD LOSS: min  LOCAL MEDICATIONS USED:  LIDOCAINE   COUNTS: confirmed correct.  PATIENT DISPOSITION:  PACU - hemodynamically stable.  Delay start of Pharmacological VTE agent (>24hrs) due to surgical blood loss or risk of bleeding: no  INDICATION FOR PROCEDURE: CASSIE HENKELS is a 73 y.o. male with worsening ischemia of his left foot after femoral endarterectomy. After careful discussion of risks, benefits, and alternatives the patient was offered angiogram. The patient understood and wished to proceed.  OPERATIVE FINDINGS:  Left Lower Extremity Angiogram:             External iliac artery: patent             Common femoral artery: patent             Profunda femoris artery: patent  Superficial femoral artery: heavily diseased and calcified. Multifocal stenosis, greatest 75%.             Popliteal artery: heavily diseased and calcified. CTO above and behind the knee. Below knee popliteal artery patent, but with stenosis of >70%             Anterior tibial artery: occluded proximally; heavily calcified. Reconstitutes as a heavily diseased artery in the mid calf and courses to the foot. No bypass target seen.             Tibioperoneal trunk: patent, but heavily diseased             Peroneal artery: patent, but heavily diseased and calcified. No bypass target.             Posterior tibial artery: occluded             Pedal circulation: severely disadvantaged  GLASS score. FP: 4. IP: 4  WIfI score. Wound: 2; ischemia: 3; infection: 1. Stage: 4  DESCRIPTION OF PROCEDURE: After  identification of the patient in the pre-operative holding area, the patient was transferred to the operating room. The patient was positioned supine on the operating room table.  Anesthesia was induced. The groins was prepped and draped in standard fashion. A surgical pause was performed confirming correct patient, procedure, and operative location.  The right groin was anesthetized with subcutaneous injection of 1% lidocaine . Using ultrasound guidance, the right common femoral artery was accessed with micropuncture technique.  Fluoroscopy was used to confirm cannulation over the femoral head. The 7F micropuncture sheath was upsized to 355F.  A Benson wire was advanced into the distal aorta. Over the wire an omni flush catheter was advanced to the level of L2. Aortogram was performed - see above for details.  The left common iliac artery was selected with an omniflush catheter and glidewire guidewire. The wire was advanced into the common femoral artery. Over the wire the omni flush catheter was advanced into the external iliac artery. Selective angiography was performed - see above for details.  The decision was made to intervene. The patient was heparinized with 6000 units of heparin . The 355F sheath was exchanged for a 55F x45cm sheath. Selective angiography of the left lower extremity performed prior to intervention.  I attempted to cross the lesions.  I was able to track a catheter and wire system into the behind the popliteal artery, but could not cross any further.  We stopped here.  Perclose was used to close the arteriotomy. Hemostasis was excellent upon completion.  Conscious sedation was administered with the use of IV fentanyl  and midazolam  under continuous physician and nurse monitoring.  Heart rate, blood pressure, and oxygen saturation were continuously monitored.  Total sedation time was 52 minutes  Upon completion of the case instrument and sharps counts were confirmed correct. The patient was  transferred to the PACU in good condition. I was present for all portions of the procedure.  PLAN: Optimal medical therapy for peripheral arterial disease.  Needs a left above-knee amputation or transition to comfort measures.  Debby SAILOR. Magda, MD [facility] Phone Number: [phone] 01/17/2024 4:06 PM    VAS US  ABI WITH/WO TBI Result Date: 01/15/2024  LOWER EXTREMITY DOPPLER STUDY Patient Name:  DAMAN STEFFENHAGEN  Date of Exam:   01/14/2024 Medical Rec #: [id number]       Accession #:    [id number] Date of Birth: [birth year]  Patient Gender: M Patient Age:   24 years Exam Location:  [address] Procedure:      VAS US  ABI WITH/WO TBI Referring Phys: DEBBY ROBERTSON --------------------------------------------------------------------------------  Indications: Peripheral artery disease. High Risk         Hypertension, hyperlipidemia, Diabetes, past history of Factors:          smoking.  Comparison Study: 11/26/23                   right: [place]                   left: 0.26 Performing Technologist: Dena Pane  Examination Guidelines: A complete evaluation includes at minimum, Doppler waveform signals and systolic blood pressure reading at the level of bilateral brachial, anterior tibial, and posterior tibial arteries, when vessel segments are accessible. Bilateral testing is considered an integral part of a complete examination. Photoelectric Plethysmograph (PPG) waveforms and toe systolic pressure readings are included as required and additional duplex testing as needed. Limited examinations for reoccurring indications may be performed as noted.  ABI Findings: +---------+------------------+-----+-------------------+--------+ Right    Rt Pressure (mmHg)IndexWaveform           Comment  +---------+------------------+-----+-------------------+--------+ Brachial 100                    triphasic                    +---------+------------------+-----+-------------------+--------+ PTA      254               1.57 dampened monophasic         +---------+------------------+-----+-------------------+--------+ DP       254               1.57 monophasic                  +---------+------------------+-----+-------------------+--------+ Great Toe34                0.21                             +---------+------------------+-----+-------------------+--------+ +---------+------------------+-----+-------------------+----------------+ Left     Lt Pressure (mmHg)IndexWaveform           Comment          +---------+------------------+-----+-------------------+----------------+ Brachial 162                    triphasic                           +---------+------------------+-----+-------------------+----------------+ PTA      42                0.26 dampened monophasic                 +---------+------------------+-----+-------------------+----------------+ DP       254               1.57 dampened monophasic                 +---------+------------------+-----+-------------------+----------------+ Great Toe                                          Unable to obtain +---------+------------------+-----+-------------------+----------------+ +-------+-----------+-----------+------------+------------+ ABI/TBIToday's ABIToday's TBIPrevious ABIPrevious TBI +-------+-----------+-----------+------------+------------+ Right  [place]  0.21       [place]          0.25         +-------+-----------+-----------+------------+------------+ Left            absent     0.26        0            +-------+-----------+-----------+------------+------------+ Repeated bilateral blood pressures. Right ABIs and TBIs appear essentially unchanged. Left ABIs appear increased.  Summary: Right: Resting right ankle-brachial index indicates noncompressible right lower extremity arteries. The right toe-brachial index  is abnormal.  Left: Resting left ankle-brachial index indicates noncompressible left lower extremity arteries. Unable to obtain left digit pressure. *See table(s) above for measurements and observations.  Electronically signed by Penne Colorado MD on 01/15/2024 at 12:04:35 PM.    Final    VAS US  LOWER EXTREMITY ARTERIAL DUPLEX Result Date: 01/15/2024 LOWER EXTREMITY ARTERIAL DUPLEX STUDY Patient Name:  BURT PIATEK  Date of Exam:   01/14/2024 Medical Rec #: [id number]       Accession #:    [id number] Date of Birth: January 27, 1951       Patient Gender: M Patient Age:   89 years Exam Location:  [address] Procedure:      VAS US  LOWER EXTREMITY ARTERIAL DUPLEX Referring Phys: DEBBY ROBERTSON --------------------------------------------------------------------------------  Indications: Rest pain, and atherosclerosis of native arteries. High Risk Factors: Hypertension, hyperlipidemia, Diabetes, past history of                    smoking.  Current ABI: right:               left:  Comparison Study: N/A Performing Technologist: Dena Pane  Examination Guidelines: A complete evaluation includes B-mode imaging, spectral Doppler, color Doppler, and power Doppler as needed of all accessible portions of each vessel. Bilateral testing is considered an integral part of a complete examination. Limited examinations for reoccurring indications may be performed as noted.  +-----------+--------+-----+--------+-----------+-------------------+ LEFT       PSV cm/sRatioStenosisWaveform   Comments            +-----------+--------+-----+--------+-----------+-------------------+ CFA Mid    111                  biphasic                       +-----------+--------+-----+--------+-----------+-------------------+ DFA        34                   biphasic                       +-----------+--------+-----+--------+-----------+-------------------+ SFA Prox   29                   triphasic                       +-----------+--------+-----+--------+-----------+-------------------+ SFA Mid    19                   multiphasic                    +-----------+--------+-----+--------+-----------+-------------------+ SFA Distal 38                   mutliphasic                    +-----------+--------+-----+--------+-----------+-------------------+ POP Prox  35                   monophasic                     +-----------+--------+-----+--------+-----------+-------------------+ POP Mid                 occluded                               +-----------+--------+-----+--------+-----------+-------------------+ POP Distal              occluded                               +-----------+--------+-----+--------+-----------+-------------------+ TP Trunk                                   Not well visualized +-----------+--------+-----+--------+-----------+-------------------+ ATA Distal 16                   monophasic                     +-----------+--------+-----+--------+-----------+-------------------+ PTA Prox   15                   monophasic                     +-----------+--------+-----+--------+-----------+-------------------+ PTA Mid                                    Not well visualized +-----------+--------+-----+--------+-----------+-------------------+ PTA Distal 18                   monophasic                     +-----------+--------+-----+--------+-----------+-------------------+ PERO Distal32                   monophasic                     +-----------+--------+-----+--------+-----------+-------------------+  Summary: Left: The mid and distal popliteal artery are occluded. The tibioperoneal trunk is not well visualized. Monophasic flow is visualized in the calf vessels. Atherosclerosis is seen throughout the left lower extremity.  See table(s) above for measurements and observations. Electronically signed by Debby Robertson on 01/15/2024 at  10:37:50 AM.    Final     Microbiology Recent Results (from the past 240 hours)  MRSA Next Gen by PCR, Nasal     Status: None   Collection Time: 01/22/24 10:02 PM   Specimen: Nasal Mucosa; Nasal Swab  Result Value Ref Range Status   MRSA by PCR Next Gen NOT DETECTED NOT DETECTED Final    Comment: (NOTE) The GeneXpert MRSA Assay (FDA approved for NASAL specimens only), is one component of a comprehensive MRSA colonization surveillance program. It is not intended to diagnose MRSA infection nor to guide or monitor treatment for MRSA infections. Test performance is not FDA approved in patients less than 36 years old. Performed at [facility], [address] [place], [place] [address]     Lab Basic Metabolic Panel: Recent Labs  Lab 01/23/24 0003 01/23/24 9790 01/23/24 9367 01/23/24 9041 01/24/24 0349 01/24/24 0507 01/24/24 1527  01/24/24 1529 01/25/24 0343 01/25/24 0344 01/25/24 0450 01/25/24 1552 01/25/24 1746 01/15/2024 0440 01/14/2024 0848 01/04/2024 1128 01/10/2024 1155 01/18/2024 1438 01/14/2024 1628 01/06/2024 1820 01/05/2024 1955  NA 140   < > 140   < > 138   < > 138   < >  --  136   < > 131*   < > 134*   < > 132* 133* 130* 130*  --  129*  K 4.9   < > 4.5   < > 6.3*   < > 5.3*   < >  --  5.2*   < > 5.0   < > 4.8   < > 5.0 5.1 5.3* 5.8* 6.3* 6.5*  CL 99  --  93*  --  89*  --  92*  --   --  94*  --  93*  --  98  --   --  95*  --   --   --   --   CO2 8*  --  10*  --  25  --  19*  --   --  16*  --  19*  --  19*  --   --  13*  --   --   --   --   GLUCOSE 356*  --  506*  --  234*  --  82  --   --  65*  --  117*  --  118*  --   --  88  --   --   --   --   BUN 32*  --  35*  --  46*  --  39*  --   --  27*  --  23  --  18  --   --  15  --   --   --   --   CREATININE 3.24*  --  3.40*  --  4.28*  --  3.52*  --   --  2.64*  --  2.26*  --  1.77*  --   --  1.55*  --   --   --   --   CALCIUM  8.4*  --  8.2*  --  7.1*  --  8.7*  --   --  8.3*  --  8.0*  --  8.1*  --   --  8.5*  --   --    --   --   MG 2.4  --  2.1  --  1.7  --   --   --   --  2.1  --   --   --  2.1  --   --   --   --   --   --   --   PHOS  --   --   --   --   --   --  6.4*  --  5.3*  --   --  5.4*  --  4.8*  --   --  5.7*  --   --   --   --    < > = values in this interval not displayed.   Liver Function Tests: Recent Labs  Lab 01/24/24 0349 01/24/24 1527 01/25/24 0344 01/25/24 1552 01/11/2024 0440 01/10/2024 0445 01/19/2024 1155  AST 4,779*  --  4,760*  --  3,446* 3,391* 3,511*  ALT 1,134*  --  250*  --  105* 101* 128*  ALKPHOS 207*  --  238*  --  349* 350* 433*  BILITOT 2.6*  --  4.3*  --  5.4* 5.1* 6.2*  PROT 3.9*  --  5.1*  --  4.4* >12.0* 4.4*  ALBUMIN  1.8*   < > 2.3* 2.4* 2.0* 2.0* 1.9*   < > = values in this interval not displayed.   No results for input(s): LIPASE, AMYLASE in the last 168 hours. No results for input(s): AMMONIA in the last 168 hours. CBC: Recent Labs  Lab 01/23/24 0003 01/23/24 0209 01/23/24 0632 01/23/24 0958 01/24/24 0349 01/24/24 0507 01/24/24 1030 01/24/24 1529 01/24/24 1700 01/24/24 2225 01/24/24 2320 01/25/24 0450 01/25/24 0601 01/25/24 1026 01/25/24 2021 01/25/24 2116 01/04/2024 0330 01/17/2024 0848 01/15/2024 1041 01/13/2024 1128 01/21/2024 1438 01/05/2024 1628 01/05/2024 1955  WBC 12.4*  --  7.9  --  13.1*  --  10.3  --   --   --   --   --   --   --  12.6*  --   --   --   --   --   --   --   --   NEUTROABS  --   --   --   --   --   --  8.5*  --   --   --   --   --   --   --   --   --   --   --   --   --   --   --   --   HGB 6.2*   < > 8.7*   < > 7.4*   < > 7.9*   < >  --    < >  --    < >  --    < > 7.9*   < >  --    < > 8.2* 9.2* 8.5* 8.2* 7.5*  HCT 20.9*   < > 28.5*   < > 21.3*   < > 22.7*   < >  --    < >  --    < >  --    < > 24.4*   < >  --    < > 24.0* 27.0* 25.0* 24.0* 22.0*  MCV 103.0*  --  96.9  --  88.0  --  88.7  --   --   --   --   --   --   --  95.7  --   --   --   --   --   --   --   --   PLT 167  --  99*   < > 71*  69*   < > 58*  --  75*  --   73*  --  71*  --  63*  --  56*  --   --   --   --   --   --    < > = values in this interval not displayed.   Cardiac Enzymes: No results for input(s): CKTOTAL, CKMB, CKMBINDEX, TROPONINI in the last 168 hours. Sepsis Labs: Recent Labs  Lab 01/23/24 0003 01/23/24 9367 01/24/24 0349 01/24/24 1030 01/25/24 0344 01/25/24 2021  WBC 12.4* 7.9 13.1* 10.3  --  12.6*  LATICACIDVEN >9.0* >9.0*  --   --  >9.0*  --     Procedures/Operations  Above-knee amputation 11/19 Endotracheal intubation 11/19 Dialysis catheter placement 11/20 CRRT 11/20 11/20 echocardiogram with severe cardiomyopathy with ejection fraction of 25 to 30%    Dontasia Miranda A Shaquandra Galano 01/28/2024, 2:45 PM

## 2024-02-03 NOTE — Progress Notes (Signed)
   01/06/2024 2200  Spiritual Encounters  Type of Visit Initial  Care provided to: Pt and family  Conversation partners present during encounter Nurse  Referral source Patient request;Nurse (RN/NT/LPN)  Reason for visit End-of-life  OnCall Visit Yes  Interventions  Spiritual Care Interventions Made Established relationship of care and support;Compassionate presence;Reflective listening;Normalization of emotions;Narrative/life review;Bereavement/grief support;Prayer;Supported grief process;Provided grief education  Intervention Outcomes  Outcomes Connection to spiritual care;Awareness around self/spiritual resourses;Reduced anxiety;Reduced isolation;Patient family open to resources    Chaplain responded to unit page request for emotional/spiritual support to family - Wife [name], and later, son Alm.

## 2024-02-03 NOTE — Progress Notes (Signed)
 This RN reduced CRRT patient fluid removal rate to zero due to hemodynamic instability.   Current ABP: 89/42 MAP of 54/ HR 122  Will increase fluid removal rate as tolerated by patient.   Sonny DASEN RN

## 2024-02-03 NOTE — Progress Notes (Signed)
Additional family member to bedside.

## 2024-02-03 NOTE — Progress Notes (Signed)
Initial Nutrition Assessment  DOCUMENTATION CODES:   Not applicable  INTERVENTION:  - Per CCM, continue trickle tube feeds only via OGT at this time: Vital HP @ 52mL/hr  - Once able to advance past trickle tube feeds, recommend below TF regimen: Vital 1.5 at 45 ml/h (1080 ml per day) *Would recommend advancing by 10mL Q12H Prosource TF20 60 ml BID Provides 1780 kcal, 113 gm protein, 825 ml free water  daily  - Monitor magnesium , potassium, and phosphorus daily for at least 3 days, MD to replete as needed, as pt is at risk for refeeding syndrome.  - FWF per CCM.    NUTRITION DIAGNOSIS:   Inadequate oral intake related to inability to eat as evidenced by NPO status.  GOAL:   Patient will meet greater than or equal to 90% of their needs  MONITOR:   Vent status, Labs, Weight trends, TF tolerance  REASON FOR ASSESSMENT:   Consult Enteral/tube feeding initiation and management (trickle TF)  ASSESSMENT:    73 y.o. male with PMH significant for HTN, PAD (s/p L femoral endarterectomy + bovine patch for critical limb ischemia 2022, 2025), R subclavian artery stenosis, CKD stage IV, GERD, laryngeal CA (stage II SCC s/p XRT) who presented 11/19 for elective L AKA.   11/19 Presented for elective L AKA; sustained PEA arrest postoperatively, CPR performed x8 minutes before ROSC, intubated.  11/21 CRRT initiated 11/22 Trickle TF initiated  Patient is currently intubated on ventilator support MV: 6.3 L/min Temp (24hrs), Avg:99 F (37.2 C), Min:96.6 F (35.9 C), Max:99.9 F (37.7 C)  RD working remotely.   Per chart review, patient with significant weight loss from July to November.  OGT placed 11/19. Initially side port only at the GE junction but per CT abdomen/pelvis 11/20 enteric tube in satisfactory position. Per this CT, patient also noted to have shock bowel appearance with diffusely fluid filled small bowel loops.  Patient noted to be frequently experiencing  hypoglycemia. CBG <10 this AM. He has been on continuous D10 @ 40 since 11/21 and trickles since 11/22.   CCM note today states patient has hypoactive bowel sounds, plan to continue trickles only at this time.    Medications reviewed and include: Reglan  TID, Protonix , D10 @ 63mL/hr (provides 326 kcals over 24 hours) Precedex  Levophed  @ 6 mcg/min  Labs reviewed:  Na 134 Creatinine 1.77 Phosphorus 4.8 HA1C 6.9 (as of 7/14) Blood Glucose <10 - 123 x24 hours   NUTRITION - FOCUSED PHYSICAL EXAM:  RD working remotely  Diet Order:   Diet Order             Diet NPO time specified  Diet effective now                   EDUCATION NEEDS:  Not appropriate for education at this time  Skin:  Skin Assessment: Skin Integrity Issues: Skin Integrity Issues:: Incisions Incisions: Surgical on Left Leg  Last BM:  11/21  Height:  Ht Readings from Last 1 Encounters:  01/25/24 5' 11 (1.803 m)   Weight:  Wt Readings from Last 1 Encounters:  01/05/2024 62.3 kg   Ideal Body Weight:  71.9 kg (adjusted for L AKA)  BMI:  Body mass index is 19.16 kg/m.  Estimated Nutritional Needs:  Kcal:  1700-1950 kcals Protein:  85-110 grams Fluid:  >/= 1.7L    Trude Ned RD, LDN Contact via Secure Chat.

## 2024-02-03 NOTE — Progress Notes (Signed)
 Chaplain at bedside

## 2024-02-03 NOTE — Progress Notes (Signed)
 Pt with NSVT. Pt code status just changed to  DNR-interventions. Confirmed with wife at bedside that she would not want pt to be shocked if pt developed sustained VT while pt maintaining pulse. Elink notified.

## 2024-02-03 NOTE — Progress Notes (Signed)
 Pt asystole with non-pulsatile arterial line, no palpable pulse, and no heart tones x 2 minutes-confirmed with Catarina, RN. Pt is DNR. Family at bedside, support given, questions answer. Elink notified.

## 2024-02-03 NOTE — Progress Notes (Signed)
Nephrology Follow-Up Consult note   Assessment/Recommendations: TERALD JUMP is a/an 73 y.o. male with a past medical history significant for PVD, CKD, HTN, HLD who presents with cardiac arrest c/b AKI, shock, and acidosis   Severe anuric AKI on CKD 3b: Baseline creatinine around 2-2.4.  AKI secondary to ATN from shock - Continue with CRRT (started 11/21) -Increase DFR today -Continue to monitor daily Cr, Dose meds for GFR -Monitor Daily I/Os, Daily weight  -Maintain MAP>65 for optimal renal perfusion.  -Avoid nephrotoxic medications including NSAIDs -Use synthetic opioids (Fentanyl /Dilaudid ) if needed -Poor prognosis, advance GOC   Shock: Hemorrhagic.  May be vasoplegic or septic.  Continue pressors and further treatment per primary team   Acute hypoxic respiratory failure: Associated with aspiration.  Management per primary team continue ventilation   Severe anion gap metabolic acidosis: Associated with lactic acidosis.  Poor improvement as lactate is likely not clearing. CRRT does not remove lactic acid efficiently. This more reflects poor liver function and possibly ongoing end organ ischemia. This portends to a very poor prognosis. Increasing DFR to help mitigate ongoing acidemia.   DIC/hepatic dysfunction: Coagulopathy likely multifactorial with DIC and possible hepatic dysfunction contributing.  Continue supportive care per primary team   PEA arrest: On 11/19 associated with surgery.   Peripheral vascular disease: Status post amputation on 11/19 with vascular surgery   Anemia: Likely related blood loss.  Transfusion as needed.  Goals of care: Severely ill.  Sounds like worsening outpatient before this.  Advance goals of care as able.    Recommendations conveyed to primary service.    [facility] 01/12/2024 8:57 AM  ___________________________________________________________  CC: Cardiac arrest  Interval History/Subjective:  Worsening tachycardia and afib overnight with hypotension unable to pull any fluid. Persistent acidemia today.   Medications:  Current Facility-Administered Medications  Medication Dose Route Frequency Provider Last Rate Last Admin   0.9 %  sodium chloride  infusion (Manually program via Guardrails IV Fluids)   Intravenous Once Rosan Deward ORN, NP   Held at 01/23/24 2047   0.9 %  sodium chloride  infusion (Manually program via Guardrails IV Fluids)   Intravenous Once Haze Led, MD   Held at 01/25/24 2353   acetylcysteine  (ACETADOTE ) 18,000 mg in dextrose  5 % 590 mL (30.5085 mg/mL) infusion  6.25 mg/kg/hr Intravenous Continuous Olalere, Adewale A, MD 12.54 mL/hr at 01/08/2024 0800 6.25 mg/kg/hr at 01/11/2024 0800   amiodarone  (NEXTERONE  PREMIX) 360-4.14 MG/200ML-% (1.8 mg/mL) IV infusion  60 mg/hr Intravenous Continuous Paliwal, Aditya, MD 33.3 mL/hr at 01/21/2024 0800 60 mg/hr at 01/20/2024 0800   amiodarone  (NEXTERONE  PREMIX) 360-4.14 MG/200ML-% (1.8 mg/mL) IV infusion  30 mg/hr Intravenous Continuous Paliwal, Aditya, MD       cefTRIAXone  (ROCEPHIN ) 2 g in sodium chloride  0.9 % 100 mL IVPB  2 g Intravenous Q24H Olalere, Adewale A, MD   Stopped at 01/25/24 2306   Chlorhexidine  Gluconate Cloth 2 % PADS 6 each  6 each Topical Daily Gatha Pence, MD   6 each at 01/25/24 2308   dexamethasone  (DECADRON ) injection 20 mg  20 mg Intravenous Q24H Olalere, Adewale A, MD   20 mg at 01/25/24 1442   Followed by   NOREEN ON 01/29/2024] dexamethasone  (DECADRON ) injection 10 mg  10 mg Intravenous Q24H Olalere, Adewale A, MD       dexmedetomidine  (PRECEDEX ) 400 MCG/100ML (4 mcg/mL) infusion  0-1.2 mcg/kg/hr Intravenous Titrated Mohammed, Shahid, MD 8.72 mL/hr at 01/11/2024 0800 0.6 mcg/kg/hr at 01/28/2024 0800   dextrose  10 %  infusion   Intravenous Continuous Olalere, Adewale A, MD 40 mL/hr at 01/24/2024 0800 Infusion Verify at 01/29/2024 0800   docusate (COLACE) 50 MG/5ML liquid 100 mg  100 mg Per Tube BID PRN Gatha Pence, MD       feeding supplement (VITAL HIGH PROTEIN) liquid 1,000 mL  1,000 mL Per Tube Q24H Olalere, Adewale A, MD   Infusion Verify at 01/29/2024 0800   fentaNYL  (SUBLIMAZE ) injection 25-100 mcg  25-100 mcg Intravenous Q30 min PRN Neda Hammond A, MD   100 mcg at 01/25/24 2259   heparin  injection 1,000-6,000 Units  1,000-6,000 Units CRRT PRN Macel Jayson PARAS, MD       insulin  aspart (novoLOG ) injection 0-15 Units  0-15 Units Subcutaneous Q4H Paliwal, Ria, MD   2 Units at 01/24/24 1215   metoCLOPramide  (REGLAN ) 5 MG/5ML solution 5 mg  5 mg Per Tube TID Olalere, Adewale A, MD       norepinephrine  (LEVOPHED ) 16 mg in (0.064 mg/mL) premix infusion  0-60 mcg/min Intravenous Titrated Laron Agent, RPH 5.63 mL/hr at 01/15/2024 0800 6 mcg/min at 01/25/2024 0800   ondansetron  (ZOFRAN ) injection 4 mg  4 mg Intravenous Q6H PRN Rhyne, Samantha J, PA-C       Oral care mouth rinse  15 mL Mouth Rinse Q2H Ogan, Okoronkwo U, MD   15 mL at 01/29/2024 0734   Oral care mouth rinse  15 mL Mouth Rinse PRN Ogan, Okoronkwo U, MD       pantoprazole  (PROTONIX ) injection 40 mg  40 mg Intravenous Q12H Olalere, Adewale A, MD   40 mg at 01/25/24 2235   phenol (CHLORASEPTIC) mouth spray 1 spray  1 spray Mouth/Throat PRN Rhyne, Samantha J, PA-C       polyethylene glycol (MIRALAX  / GLYCOLAX ) packet 17 g  17 g Per Tube Daily PRN Gatha Pence, MD       PrismaSol  BGK 2/3.5 infusion   CRRT Continuous Macel Jayson PARAS, MD 400 mL/hr at 01/21/2024 0116 New Bag at 01/06/2024 0116   PrismaSol  BGK 2/3.5 infusion   CRRT Continuous Macel Jayson PARAS, MD 400 mL/hr at 02/02/2024 0117 New Bag at 01/21/2024 0117   PrismaSol  BGK 2/3.5 infusion   CRRT Continuous Macel Jayson PARAS, MD 1,500 mL/hr at 01/18/2024 0733 New Bag at 01/05/2024 0733   sodium bicarbonate  150 mEq in sterile water  1,150 mL infusion   Intravenous Continuous Olalere, Adewale A, MD       sodium bicarbonate  injection 50 mEq  50 mEq Intravenous Once Olalere, Adewale A, MD        sodium chloride  0.9 % primer fluid for CRRT   CRRT PRN Macel Jayson PARAS, MD          Review of Systems: Unable to obtain due to the sedation  Physical Exam: Vitals:   01/29/2024 0815 01/24/2024 0850  BP:    Pulse: 75   Resp: 18   Temp: 99.3 F (37.4 C)   SpO2: (!) 74% (!) 74%   Total I/O In: 120.2 [I.V.:100.2; NG/GT:20] Out: -   Intake/Output Summary (Last 24 hours) at 01/17/2024 0857 Last data filed at 01/04/2024 0800 Gross per 24 hour  Intake 3120.4 ml  Output 2412.2 ml  Net 708.2 ml   General: Ill-appearing, lying in bed, sedated HEENT: Eyes closed, moist mucous membranes, chewing on ET tube CV: tachycardic, no rub Lungs: Coarse bilateral breath sounds, ventilated, bilateral chest rise Abd: soft, non-tender, non-distended Skin: no visible lesions or rashes Musculoskeletal: left leg absent above the  knee, no obvious deformities  Test Results I personally reviewed new and old clinical labs and radiology tests Lab Results  Component Value Date   NA 131 (L) 01/25/2024   K 5.0 01/09/2024   CL 98 01/25/2024   CO2 19 (L) 01/16/2024   BUN 18 01/30/2024   CREATININE 1.77 (H) 01/10/2024   CALCIUM  8.1 (L) 01/06/2024   ALBUMIN  2.0 (L) 01/16/2024   PHOS 4.8 (H) 01/07/2024    CBC Recent Labs  Lab 01/24/24 0349 01/24/24 0507 01/24/24 1030 01/24/24 1529 01/25/24 0601 01/25/24 1026 01/25/24 2021 01/25/24 2116 01/20/2024 0021 01/05/2024 0329 01/05/2024 0330 01/23/2024 0848  WBC 13.1*  --  10.3  --   --   --  12.6*  --   --   --   --   --   NEUTROABS  --   --  8.5*  --   --   --   --   --   --   --   --   --   HGB 7.4*   < > 7.9*   < >  --    < > 7.9*   < > 7.5* 8.2*  --  7.8*  HCT 21.3*   < > 22.7*   < >  --    < > 24.4*   < > 22.0* 24.0*  --  23.0*  MCV 88.0  --  88.7  --   --   --  95.7  --   --   --   --   --   PLT 71*  69*   < > 58*   < > 71*  --  63*  --   --   --  56*  --    < > = values in this interval not displayed.

## 2024-02-03 NOTE — Progress Notes (Addendum)
eLink Physician-Brief Progress Note Patient Name: William Perkins DOB: 01/28/1951 MRN: [id number]   Date of Service  01/17/2024  HPI/Events of Note  Critical Abg 7.133/32.6/74, concern that one of his pupils is also a little bigger than the other  eICU Interventions  Already on bicarb and CRRT, no intervention CT head ideally, but not safe for transport at this time, continue neuro checks    2145 -had a very frank conversation with [name] at bedside.  We talked about how [name] is in multiorgan dysfunction and has been persistently declining for the past several days with escalating pressor requirements tonight.  Given his ongoing decline, we talked about comfort measures versus continuing current medical care, the risk benefits and alternatives to resuscitative therapy, and the fact that [name]'s prognosis with his disease process is extremely grim and I believe that [name] is likely to pass away regardless of our multiple interventions.  We concluded that for the immediate time being, we would switch to DNR and continue current interventions, but as the night progresses, and that we will reach out to family members and talk about comfort measures instead.   Persistent hypotension despite escalation of nor epi to 50.  Add epinephrine , 1 ampoule of bicarbonate push.  Maintain other therapies  2343 -notified that the patient had expired, family at bedside, discontinuing CRRT and ventilator.   Intervention Category Minor Interventions: Clinical assessment - ordering diagnostic tests  Jansen Goodpasture 01/13/2024, 8:31 PM

## 2024-02-03 NOTE — Progress Notes (Signed)
 Called to bedside  Increasing pressor requirement Patient not very responsive  Switch back to full vent support  Ordered vasopressin  with Levophed 

## 2024-02-03 NOTE — Progress Notes (Signed)
 ABG obtained and shown to MD at this time. RT placed pt back on full support, post ABG

## 2024-02-03 NOTE — Progress Notes (Signed)
 Acidemia persists  ABG reviewed  Give 1 more amp of bicarb  Increase bicarb drip to 150 cc an hour  Increase PEEP back up to 8, FiO2 at 40%  Appears to be deteriorating unfortunately

## 2024-02-03 NOTE — Progress Notes (Signed)
 ABG noted  Metabolic acidosis  Will give 1 amp of bicarb and start him on a drip at 50 cc

## 2024-02-03 NOTE — Progress Notes (Signed)
 NAME:  William Perkins, MRN:  [id number], DOB:  28-Jun-1950, LOS: 4 ADMISSION DATE:  01/22/2024, CONSULTATION DATE: 01/22/2024 REFERRING PHYSICIAN: Georgina - TRH, CHIEF COMPLAINT:  Cardiac Arrest  History of Present Illness:  73 year old man who presented to [facility] 11/19 for elective L AKA. 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  Patient underwent L AKA 11/19 with Dr. Magda ([facility]). Intraoperative course was uncomplicated with EBL. Patient was transferred to PACU in stable condition. Postoperatively while in PACU, patient was noted to be very drowsy and became bradycardic. Rhythm deteriorated into PEA and Code Blue was called. CPR was performed immediately per ACS protocol x 18 minutes with ROSC. Of note, patient had episode of bloody emesis prior to intubation.  On arrival to ICU, patient was initially hemodynamically stable but slowly becoming more hypotensive EKG showed first-degree AV block sinus rhythm with LBBB.  CXR demonstrated bilateral infiltrates (R > L) with concern for aspiration. Levophed  and vasopressin  were started and A-line/CVC placed in ICU. Bronch was completed showing diffuse blood, but no active bleeding; likely aspirated hematemesis. ABG demosntrated significant metabolic acidosis. Bicarb drip was started. Hgb 6.2 and 2U PRBCs ordered + FFP/cryo. Bedside POCUS showed no pericardial effusion no right heart strain and EF appeared low.  PCCM consulted for ICU admission and management.  Pertinent  Medical History   Past Medical History:  Diagnosis Date   Cancer (HCC) 02/2023   laryngeal cancer   Chronic renal insufficiency 03/21/2006   CrCl <50 ml   DDD (degenerative disc disease), cervical    GERD 03/22/2006   Qualifier: Diagnosis of  By: [name]     GERD (gastroesophageal reflux  disease) 03/22/2006   Hand numbness 10/07/2009   pt states not any longer   Hyperlipidemia 03/22/2008   Hypertension    Hypertension 03/22/2006   Left ventricular hypertrophy 03/22/2006   Leg pain, left 11/15/2009   Lumbar strain 07/15/2008   Renal insufficiency    Soft tissue disorder 08/2008   Tobacco abuse 03/22/2006   2 ppd x 30 years   Significant Hospital Events: Including procedures, antibiotic start and stop dates in addition to other pertinent events   11/19 - Left AKA completed, sustained PEA arrest postoperatively. Intubated. 11/21-in the ICU Has received multiple blood products 11/21-started on CRRT 11/22-went into A-fib with RVR, required PRBC, started on amiodarone , weaned off Veletri   Interim History / Subjective:   Admitted 1119 s/p cardiac arrest, CPR for 18 minutes. Became coagulopathic with DIC, large GI bleed plus possible hemoptysis. Started on CRRT which is continuing Lower dose of pressures  Off sedation on 1122, was able to squeeze hands, was able to stick out his tongue and was tracking   Objective:   Blood pressure (!) 118/52, pulse 99, temperature 99.3 F (37.4 C), resp. rate (!) 22, height 5' 11 (1.803 m), weight 62.3 kg, SpO2 (!) 82%.    Vent Mode: PRVC FiO2 (%):  [40 %-50 %] 40 % Set Rate:  [16 bmp-18 bmp] 16 bmp Vt Set:  [520 mL-600 mL] 520 mL PEEP:  [8 cmH20-15 cmH20] 8 cmH20 Plateau Pressure:  [16 cmH20-28 cmH20] 19 cmH20   Intake/Output Summary (Last 24 hours) at 01/29/2024 0740 Last data filed at 02/01/2024 0700 Gross per 24 hour  Intake 3053.66 ml  Output 2517.2 ml  Net 536.46 ml  Filed Weights   01/23/24 0500 01/25/24 0500 01/15/2024 0336  Weight: 61.2 kg 65.4 kg 62.3 kg   Physical Examination: General: Acutely ill-appearing  HEENT: Moist oral mucosa, jaundiced neuro: Not responsive this morning, on sedation PULM: Coarse breath sounds bilaterally GI: Soft bowel sounds hypoactive Extremities: New left AKA with wound VAC  in place Skin is warm and dry  I reviewed last 24 h vitals and pain scores, last 48 h intake and output, last 24 h labs and trends, and last 24 h imaging results.  ABG 7.35/31/94-on PRVC 16, 520 PEEP of 8 Day 5 of ceftriaxone   Chest x-ray 01/31/2024 with less pulmonary congestion, large gastric bubble   Resolved Problem List:    Assessment and Plan:   In-hospital cardiac arrest-PEA s/p CPR and ROSC after 18 minutes - Echocardiogram with severely reduced left ejection fraction of 25 to 30% with global hypokinesis - Went into A-fib with RVR for which she was started on amiodarone  - Unable to anticoagulate  Hemorrhagic shock Hypovolemic shock - Goal MAP greater than 65 -Currently on Levophed  6 mcg/min  ARDS Continue mechanical ventilation per ARDS protocol Target TVol 6-8cc/kgIBW-currently on 7 mL/kg Target Plateau Pressure < 30cm H20 Target driving pressure less than 15 cm of water  Target PaO2 55-65: titrate PEEP/FiO2 per protocol Ventilator associated pneumonia prevention protocol  DIC - In total 4 units of PRBC, 6 pools of cryoprecipitate, 10 units of FFP and total -Not actively bleeding - Will monitor - On 20 mg Decadron -day 3  GI bleed related to DIC - Remains on PPI  Bowel sounds hypoactive -Will start Reglan  at 5 mg 3 times daily for 48 hours - Will continue with trickle feeds  Acute kidney injury on chronic kidney disease Severe metabolic acidosis - On CRRT day 3  Shock liver - On N-acetylcysteine  - Transaminases are improving  Possible pneumonia - Empiric coverage, on ceftriaxone   Hypoglycemia - On D10 - Was started on trickle feeds   Peripheral vascular disease s/p stents S/p AKA left 11/19 Postoperative management per surgery Wound care per surgery   Remains very sick Risk of demise is still very significant however overall has made significant strides  The patient is critically ill with multiple organ systems failure and requires high  complexity decision making for assessment and support, frequent evaluation and titration of therapies, application of advanced monitoring technologies and extensive interpretation of multiple databases. Critical Care Time devoted to patient care services described in this note independent of APP/resident time (if applicable)  is 45 minutes.   Jennet Epley MD [facility] Personal pager: See Amion If unanswered, please page CCM On-call: #[phone]

## 2024-02-03 NOTE — Plan of Care (Signed)
  Problem: Nutrition: Goal: Adequate nutrition will be maintained Outcome: Progressing   Problem: Coping: Goal: Level of anxiety will decrease Outcome: Progressing   Problem: Elimination: Goal: Will not experience complications related to bowel motility Outcome: Progressing   Problem: Pain Managment: Goal: General experience of comfort will improve and/or be controlled Outcome: Progressing   Problem: Safety: Goal: Ability to remain free from injury will improve Outcome: Progressing   Problem: Activity: Goal: Risk for activity intolerance will decrease Outcome: Not Progressing

## 2024-02-03 NOTE — Progress Notes (Addendum)
 Escalating pressor requirements  Persistent acidemia  Likely with significant ischemic injury  Digits are cold and clammy  Prognosis is very poor  I did discuss with spouse to let other stakeholders know as it appears that William Perkins may not survive the ongoing process  I did recommend a DNR status which she wants to discuss with other family members  Will probably not be able to achieve ROSC, previously had 18 minutes of CPR  Continue ongoing care  Repeat ABG at 1830

## 2024-02-03 NOTE — Progress Notes (Addendum)
 ABG noted  7.196/28/72  Will increase bicarb drip to 100 cc an hour Give 1 ampoule of bicarb  Currently on vasopressin  at 0.03 Levophed  at 15 mcg/minute  1600 addendum:  Large watery stool  Flexiseal ordered  Discontinued Reglan 

## 2024-02-03 DEATH — deceased

## 2024-02-11 ENCOUNTER — Encounter

## 2024-03-11 MED FILL — Medication: Qty: 1 | Status: AC
